# Patient Record
Sex: Female | Born: 1950 | Race: White | Hispanic: No | Marital: Married | Smoking: Never smoker
Health system: Southern US, Community
[De-identification: ages and names within clinical notes are randomized; demographics above are authoritative.]

## PROBLEM LIST (undated history)

## (undated) DIAGNOSIS — T8859XA Other complications of anesthesia, initial encounter: Secondary | ICD-10-CM

## (undated) DIAGNOSIS — F32A Depression, unspecified: Secondary | ICD-10-CM

## (undated) DIAGNOSIS — K9041 Non-celiac gluten sensitivity: Secondary | ICD-10-CM

## (undated) DIAGNOSIS — M1990 Unspecified osteoarthritis, unspecified site: Secondary | ICD-10-CM

## (undated) DIAGNOSIS — F988 Other specified behavioral and emotional disorders with onset usually occurring in childhood and adolescence: Secondary | ICD-10-CM

## (undated) DIAGNOSIS — R112 Nausea with vomiting, unspecified: Secondary | ICD-10-CM

## (undated) DIAGNOSIS — M25511 Pain in right shoulder: Secondary | ICD-10-CM

## (undated) DIAGNOSIS — R198 Other specified symptoms and signs involving the digestive system and abdomen: Secondary | ICD-10-CM

## (undated) DIAGNOSIS — K589 Irritable bowel syndrome without diarrhea: Secondary | ICD-10-CM

## (undated) DIAGNOSIS — E785 Hyperlipidemia, unspecified: Secondary | ICD-10-CM

## (undated) DIAGNOSIS — J302 Other seasonal allergic rhinitis: Secondary | ICD-10-CM

## (undated) DIAGNOSIS — M069 Rheumatoid arthritis, unspecified: Secondary | ICD-10-CM

## (undated) DIAGNOSIS — J189 Pneumonia, unspecified organism: Secondary | ICD-10-CM

## (undated) DIAGNOSIS — I2510 Atherosclerotic heart disease of native coronary artery without angina pectoris: Secondary | ICD-10-CM

## (undated) DIAGNOSIS — K900 Celiac disease: Secondary | ICD-10-CM

## (undated) DIAGNOSIS — Z9889 Other specified postprocedural states: Secondary | ICD-10-CM

## (undated) DIAGNOSIS — F329 Major depressive disorder, single episode, unspecified: Secondary | ICD-10-CM

## (undated) DIAGNOSIS — F419 Anxiety disorder, unspecified: Secondary | ICD-10-CM

## (undated) HISTORY — DX: Other specified behavioral and emotional disorders with onset usually occurring in childhood and adolescence: F98.8

## (undated) HISTORY — DX: Anxiety disorder, unspecified: F41.9

## (undated) HISTORY — DX: Other specified symptoms and signs involving the digestive system and abdomen: R19.8

## (undated) HISTORY — DX: Depression, unspecified: F32.A

## (undated) HISTORY — PX: TOTAL HIP ARTHROPLASTY: SHX124

## (undated) HISTORY — DX: Major depressive disorder, single episode, unspecified: F32.9

## (undated) HISTORY — PX: TONSILLECTOMY: SUR1361

## (undated) HISTORY — DX: Pneumonia, unspecified organism: J18.9

## (undated) HISTORY — PX: TOTAL SHOULDER ARTHROPLASTY: SHX126

## (undated) HISTORY — DX: Unspecified osteoarthritis, unspecified site: M19.90

## (undated) HISTORY — DX: Irritable bowel syndrome, unspecified: K58.9

## (undated) HISTORY — DX: Celiac disease: K90.0

## (undated) HISTORY — PX: ROTATOR CUFF REPAIR: SHX139

## (undated) HISTORY — DX: Hyperlipidemia, unspecified: E78.5

## (undated) HISTORY — DX: Pain in right shoulder: M25.511

---

## 1998-01-18 ENCOUNTER — Other Ambulatory Visit: Admission: RE | Admit: 1998-01-18 | Discharge: 1998-01-18 | Payer: Self-pay | Admitting: Obstetrics and Gynecology

## 1999-04-04 ENCOUNTER — Other Ambulatory Visit: Admission: RE | Admit: 1999-04-04 | Discharge: 1999-04-04 | Payer: Self-pay | Admitting: Obstetrics and Gynecology

## 1999-04-23 ENCOUNTER — Encounter: Payer: Self-pay | Admitting: Obstetrics and Gynecology

## 1999-04-23 ENCOUNTER — Ambulatory Visit (HOSPITAL_COMMUNITY): Admission: RE | Admit: 1999-04-23 | Discharge: 1999-04-23 | Payer: Self-pay | Admitting: Obstetrics and Gynecology

## 1999-09-20 ENCOUNTER — Encounter: Payer: Self-pay | Admitting: Chiropractic Medicine

## 1999-09-20 ENCOUNTER — Ambulatory Visit (HOSPITAL_COMMUNITY): Admission: RE | Admit: 1999-09-20 | Discharge: 1999-09-20 | Payer: Self-pay | Admitting: Chiropractic Medicine

## 1999-12-04 ENCOUNTER — Ambulatory Visit (HOSPITAL_COMMUNITY): Admission: RE | Admit: 1999-12-04 | Discharge: 1999-12-04 | Payer: Self-pay | Admitting: Orthopaedic Surgery

## 1999-12-04 ENCOUNTER — Encounter: Payer: Self-pay | Admitting: Orthopaedic Surgery

## 1999-12-17 ENCOUNTER — Ambulatory Visit (HOSPITAL_COMMUNITY): Admission: RE | Admit: 1999-12-17 | Discharge: 1999-12-17 | Payer: Self-pay | Admitting: Orthopaedic Surgery

## 1999-12-17 ENCOUNTER — Encounter: Payer: Self-pay | Admitting: Orthopaedic Surgery

## 2000-01-01 ENCOUNTER — Ambulatory Visit (HOSPITAL_COMMUNITY): Admission: RE | Admit: 2000-01-01 | Discharge: 2000-01-01 | Payer: Self-pay

## 2000-01-01 ENCOUNTER — Encounter: Payer: Self-pay | Admitting: Orthopaedic Surgery

## 2000-02-21 ENCOUNTER — Encounter: Payer: Self-pay | Admitting: Chiropractic Medicine

## 2000-02-21 ENCOUNTER — Ambulatory Visit (HOSPITAL_COMMUNITY): Admission: RE | Admit: 2000-02-21 | Discharge: 2000-02-21 | Payer: Self-pay | Admitting: Chiropractic Medicine

## 2000-05-05 ENCOUNTER — Encounter: Admission: RE | Admit: 2000-05-05 | Discharge: 2000-05-05 | Payer: Self-pay | Admitting: Neurosurgery

## 2000-05-11 ENCOUNTER — Other Ambulatory Visit: Admission: RE | Admit: 2000-05-11 | Discharge: 2000-05-11 | Payer: Self-pay | Admitting: Obstetrics and Gynecology

## 2000-06-02 ENCOUNTER — Encounter: Payer: Self-pay | Admitting: Orthopedic Surgery

## 2000-06-02 ENCOUNTER — Ambulatory Visit (HOSPITAL_COMMUNITY): Admission: RE | Admit: 2000-06-02 | Discharge: 2000-06-02 | Payer: Self-pay | Admitting: Orthopedic Surgery

## 2000-06-03 ENCOUNTER — Encounter: Payer: Self-pay | Admitting: Orthopedic Surgery

## 2000-06-08 ENCOUNTER — Inpatient Hospital Stay (HOSPITAL_COMMUNITY): Admission: RE | Admit: 2000-06-08 | Discharge: 2000-06-12 | Payer: Self-pay | Admitting: Orthopedic Surgery

## 2000-06-08 ENCOUNTER — Encounter: Payer: Self-pay | Admitting: Orthopedic Surgery

## 2000-06-12 ENCOUNTER — Inpatient Hospital Stay (HOSPITAL_COMMUNITY)
Admission: RE | Admit: 2000-06-12 | Discharge: 2000-06-18 | Payer: Self-pay | Admitting: Physical Medicine & Rehabilitation

## 2000-07-23 ENCOUNTER — Encounter: Admission: RE | Admit: 2000-07-23 | Discharge: 2000-10-21 | Payer: Self-pay | Admitting: Orthopedic Surgery

## 2000-10-28 ENCOUNTER — Encounter: Admission: RE | Admit: 2000-10-28 | Discharge: 2001-01-26 | Payer: Self-pay | Admitting: Orthopedic Surgery

## 2000-12-24 ENCOUNTER — Encounter: Admission: RE | Admit: 2000-12-24 | Discharge: 2001-03-24 | Payer: Self-pay | Admitting: Orthopedic Surgery

## 2001-03-25 ENCOUNTER — Encounter: Admission: RE | Admit: 2001-03-25 | Discharge: 2001-06-23 | Payer: Self-pay | Admitting: Orthopedic Surgery

## 2001-06-24 ENCOUNTER — Encounter: Admission: RE | Admit: 2001-06-24 | Discharge: 2001-07-22 | Payer: Self-pay | Admitting: Orthopedic Surgery

## 2002-04-18 ENCOUNTER — Other Ambulatory Visit: Admission: RE | Admit: 2002-04-18 | Discharge: 2002-04-18 | Payer: Self-pay | Admitting: Obstetrics and Gynecology

## 2002-05-10 ENCOUNTER — Encounter: Payer: Self-pay | Admitting: Obstetrics and Gynecology

## 2002-05-10 ENCOUNTER — Encounter: Admission: RE | Admit: 2002-05-10 | Discharge: 2002-05-10 | Payer: Self-pay | Admitting: Obstetrics and Gynecology

## 2003-05-12 ENCOUNTER — Encounter: Admission: RE | Admit: 2003-05-12 | Discharge: 2003-05-12 | Payer: Self-pay | Admitting: Obstetrics and Gynecology

## 2003-08-10 ENCOUNTER — Ambulatory Visit (HOSPITAL_COMMUNITY): Admission: RE | Admit: 2003-08-10 | Discharge: 2003-08-10 | Payer: Self-pay | Admitting: Internal Medicine

## 2003-08-10 ENCOUNTER — Encounter (INDEPENDENT_AMBULATORY_CARE_PROVIDER_SITE_OTHER): Payer: Self-pay | Admitting: Specialist

## 2003-08-11 ENCOUNTER — Ambulatory Visit (HOSPITAL_COMMUNITY): Admission: RE | Admit: 2003-08-11 | Discharge: 2003-08-11 | Payer: Self-pay | Admitting: Internal Medicine

## 2004-04-04 ENCOUNTER — Ambulatory Visit: Payer: Self-pay | Admitting: Internal Medicine

## 2004-04-08 ENCOUNTER — Ambulatory Visit: Payer: Self-pay | Admitting: Internal Medicine

## 2004-05-02 ENCOUNTER — Other Ambulatory Visit: Admission: RE | Admit: 2004-05-02 | Discharge: 2004-05-02 | Payer: Self-pay | Admitting: Obstetrics and Gynecology

## 2004-06-10 ENCOUNTER — Ambulatory Visit: Payer: Self-pay | Admitting: Internal Medicine

## 2004-08-21 ENCOUNTER — Ambulatory Visit: Payer: Self-pay | Admitting: Internal Medicine

## 2004-08-23 ENCOUNTER — Ambulatory Visit (HOSPITAL_COMMUNITY): Admission: RE | Admit: 2004-08-23 | Discharge: 2004-08-23 | Payer: Self-pay | Admitting: Internal Medicine

## 2005-05-07 ENCOUNTER — Other Ambulatory Visit: Admission: RE | Admit: 2005-05-07 | Discharge: 2005-05-07 | Payer: Self-pay | Admitting: Obstetrics and Gynecology

## 2005-09-24 ENCOUNTER — Ambulatory Visit: Payer: Self-pay | Admitting: Internal Medicine

## 2005-10-15 ENCOUNTER — Ambulatory Visit: Payer: Self-pay | Admitting: Internal Medicine

## 2005-11-05 ENCOUNTER — Ambulatory Visit: Payer: Self-pay | Admitting: Internal Medicine

## 2006-02-05 ENCOUNTER — Ambulatory Visit: Payer: Self-pay | Admitting: Internal Medicine

## 2006-05-11 ENCOUNTER — Other Ambulatory Visit: Admission: RE | Admit: 2006-05-11 | Discharge: 2006-05-11 | Payer: Self-pay | Admitting: Obstetrics and Gynecology

## 2006-05-12 ENCOUNTER — Encounter: Admission: RE | Admit: 2006-05-12 | Discharge: 2006-05-12 | Payer: Self-pay | Admitting: Obstetrics and Gynecology

## 2006-08-03 ENCOUNTER — Ambulatory Visit: Payer: Self-pay | Admitting: Internal Medicine

## 2006-08-03 LAB — CONVERTED CEMR LAB
ALT: 27 units/L (ref 0–40)
AST: 32 units/L (ref 0–37)
Albumin: 3.7 g/dL (ref 3.5–5.2)
Alkaline Phosphatase: 37 units/L — ABNORMAL LOW (ref 39–117)
BUN: 9 mg/dL (ref 6–23)
Basophils Absolute: 0.1 10*3/uL (ref 0.0–0.1)
Basophils Relative: 0.9 % (ref 0.0–1.0)
Bilirubin, Direct: 0.1 mg/dL (ref 0.0–0.3)
CO2: 32 meq/L (ref 19–32)
Calcium: 9.8 mg/dL (ref 8.4–10.5)
Chloride: 103 meq/L (ref 96–112)
Creatinine, Ser: 0.8 mg/dL (ref 0.4–1.2)
Eosinophils Absolute: 0.1 10*3/uL (ref 0.0–0.6)
Eosinophils Relative: 2.2 % (ref 0.0–5.0)
GFR calc Af Amer: 95 mL/min
GFR calc non Af Amer: 79 mL/min
Glucose, Bld: 108 mg/dL — ABNORMAL HIGH (ref 70–99)
HCT: 39.9 % (ref 36.0–46.0)
Hemoglobin: 13.9 g/dL (ref 12.0–15.0)
Lymphocytes Relative: 28.6 % (ref 12.0–46.0)
MCHC: 34.9 g/dL (ref 30.0–36.0)
MCV: 91.9 fL (ref 78.0–100.0)
Monocytes Absolute: 0.7 10*3/uL (ref 0.2–0.7)
Monocytes Relative: 10.6 % (ref 3.0–11.0)
Neutro Abs: 3.7 10*3/uL (ref 1.4–7.7)
Neutrophils Relative %: 57.7 % (ref 43.0–77.0)
Platelets: 300 10*3/uL (ref 150–400)
Potassium: 4 meq/L (ref 3.5–5.1)
RBC: 4.34 M/uL (ref 3.87–5.11)
RDW: 12.3 % (ref 11.5–14.6)
Sodium: 139 meq/L (ref 135–145)
TSH: 1.68 microintl units/mL (ref 0.35–5.50)
Total Bilirubin: 0.7 mg/dL (ref 0.3–1.2)
Total CK: 173 units/L (ref 7–177)
Total Protein: 6.3 g/dL (ref 6.0–8.3)
Vit D, 1,25-Dihydroxy: 27 (ref 20–57)
Vitamin B-12: 581 pg/mL (ref 211–911)
WBC: 6.5 10*3/uL (ref 4.5–10.5)

## 2006-08-05 ENCOUNTER — Ambulatory Visit: Payer: Self-pay | Admitting: Internal Medicine

## 2006-10-08 ENCOUNTER — Ambulatory Visit: Payer: Self-pay | Admitting: Internal Medicine

## 2006-10-08 LAB — CONVERTED CEMR LAB: Vit D, 1,25-Dihydroxy: 40 (ref 20–57)

## 2006-10-13 ENCOUNTER — Encounter: Admission: RE | Admit: 2006-10-13 | Discharge: 2006-10-13 | Payer: Self-pay | Admitting: Internal Medicine

## 2006-11-02 ENCOUNTER — Ambulatory Visit: Payer: Self-pay | Admitting: Internal Medicine

## 2007-02-04 ENCOUNTER — Encounter: Payer: Self-pay | Admitting: Internal Medicine

## 2007-02-16 ENCOUNTER — Ambulatory Visit: Payer: Self-pay | Admitting: Internal Medicine

## 2007-02-16 ENCOUNTER — Encounter: Payer: Self-pay | Admitting: Internal Medicine

## 2007-02-16 DIAGNOSIS — M25519 Pain in unspecified shoulder: Secondary | ICD-10-CM | POA: Insufficient documentation

## 2007-02-28 DIAGNOSIS — M1710 Unilateral primary osteoarthritis, unspecified knee: Secondary | ICD-10-CM | POA: Insufficient documentation

## 2007-02-28 DIAGNOSIS — E785 Hyperlipidemia, unspecified: Secondary | ICD-10-CM | POA: Insufficient documentation

## 2007-02-28 DIAGNOSIS — M1990 Unspecified osteoarthritis, unspecified site: Secondary | ICD-10-CM

## 2007-02-28 DIAGNOSIS — M179 Osteoarthritis of knee, unspecified: Secondary | ICD-10-CM | POA: Insufficient documentation

## 2007-02-28 DIAGNOSIS — M810 Age-related osteoporosis without current pathological fracture: Secondary | ICD-10-CM | POA: Insufficient documentation

## 2007-02-28 DIAGNOSIS — F32A Depression, unspecified: Secondary | ICD-10-CM | POA: Insufficient documentation

## 2007-02-28 DIAGNOSIS — F329 Major depressive disorder, single episode, unspecified: Secondary | ICD-10-CM

## 2007-05-12 ENCOUNTER — Ambulatory Visit: Payer: Self-pay | Admitting: Internal Medicine

## 2007-05-12 LAB — CONVERTED CEMR LAB
ALT: 17 units/L (ref 0–35)
AST: 21 units/L (ref 0–37)
Albumin: 4.1 g/dL (ref 3.5–5.2)
Alkaline Phosphatase: 49 units/L (ref 39–117)
BUN: 11 mg/dL (ref 6–23)
Bilirubin, Direct: 0.1 mg/dL (ref 0.0–0.3)
CO2: 32 meq/L (ref 19–32)
Calcium: 9.7 mg/dL (ref 8.4–10.5)
Chloride: 103 meq/L (ref 96–112)
Cholesterol: 221 mg/dL (ref 0–200)
Creatinine, Ser: 0.8 mg/dL (ref 0.4–1.2)
Direct LDL: 115.6 mg/dL
GFR calc Af Amer: 95 mL/min
GFR calc non Af Amer: 78 mL/min
Glucose, Bld: 108 mg/dL — ABNORMAL HIGH (ref 70–99)
HDL: 85.9 mg/dL (ref >39.0)
Potassium: 4.5 meq/L (ref 3.5–5.1)
Sodium: 140 meq/L (ref 135–145)
TSH: 2.44 microintl units/mL (ref 0.35–5.50)
Total Bilirubin: 0.9 mg/dL (ref 0.3–1.2)
Total CHOL/HDL Ratio: 2.6
Total Protein: 6.6 g/dL (ref 6.0–8.3)
Triglycerides: 65 mg/dL (ref 0–149)
VLDL: 13 mg/dL (ref 0–40)

## 2007-05-13 ENCOUNTER — Other Ambulatory Visit: Admission: RE | Admit: 2007-05-13 | Discharge: 2007-05-13 | Payer: Self-pay | Admitting: Obstetrics and Gynecology

## 2007-05-17 ENCOUNTER — Ambulatory Visit: Payer: Self-pay | Admitting: Internal Medicine

## 2007-05-17 DIAGNOSIS — F988 Other specified behavioral and emotional disorders with onset usually occurring in childhood and adolescence: Secondary | ICD-10-CM | POA: Insufficient documentation

## 2007-05-17 DIAGNOSIS — K589 Irritable bowel syndrome without diarrhea: Secondary | ICD-10-CM | POA: Insufficient documentation

## 2007-05-20 ENCOUNTER — Telehealth: Payer: Self-pay | Admitting: Internal Medicine

## 2007-05-24 ENCOUNTER — Encounter: Payer: Self-pay | Admitting: Internal Medicine

## 2007-06-15 ENCOUNTER — Encounter: Admission: RE | Admit: 2007-06-15 | Discharge: 2007-06-15 | Payer: Self-pay | Admitting: Obstetrics and Gynecology

## 2007-07-02 ENCOUNTER — Telehealth: Payer: Self-pay | Admitting: Internal Medicine

## 2007-08-16 ENCOUNTER — Ambulatory Visit: Payer: Self-pay | Admitting: Internal Medicine

## 2007-10-20 ENCOUNTER — Encounter: Payer: Self-pay | Admitting: Internal Medicine

## 2007-11-19 ENCOUNTER — Telehealth: Payer: Self-pay | Admitting: Internal Medicine

## 2007-12-28 ENCOUNTER — Ambulatory Visit: Payer: Self-pay | Admitting: Internal Medicine

## 2007-12-28 DIAGNOSIS — N924 Excessive bleeding in the premenopausal period: Secondary | ICD-10-CM | POA: Insufficient documentation

## 2008-02-15 ENCOUNTER — Encounter: Payer: Self-pay | Admitting: Internal Medicine

## 2008-05-15 ENCOUNTER — Other Ambulatory Visit: Admission: RE | Admit: 2008-05-15 | Discharge: 2008-05-15 | Payer: Self-pay | Admitting: Obstetrics and Gynecology

## 2008-06-28 ENCOUNTER — Ambulatory Visit: Payer: Self-pay | Admitting: Internal Medicine

## 2008-06-28 DIAGNOSIS — L570 Actinic keratosis: Secondary | ICD-10-CM | POA: Insufficient documentation

## 2008-06-28 DIAGNOSIS — R0989 Other specified symptoms and signs involving the circulatory and respiratory systems: Secondary | ICD-10-CM | POA: Insufficient documentation

## 2008-06-28 DIAGNOSIS — L600 Ingrowing nail: Secondary | ICD-10-CM | POA: Insufficient documentation

## 2008-06-28 DIAGNOSIS — B351 Tinea unguium: Secondary | ICD-10-CM | POA: Insufficient documentation

## 2008-06-30 ENCOUNTER — Encounter: Payer: Self-pay | Admitting: Internal Medicine

## 2008-06-30 ENCOUNTER — Ambulatory Visit: Payer: Self-pay

## 2008-07-06 ENCOUNTER — Encounter: Admission: RE | Admit: 2008-07-06 | Discharge: 2008-07-06 | Payer: Self-pay | Admitting: Obstetrics and Gynecology

## 2009-02-07 ENCOUNTER — Telehealth: Payer: Self-pay | Admitting: Internal Medicine

## 2009-05-03 ENCOUNTER — Ambulatory Visit: Payer: Self-pay | Admitting: Internal Medicine

## 2009-05-03 DIAGNOSIS — F411 Generalized anxiety disorder: Secondary | ICD-10-CM | POA: Insufficient documentation

## 2009-05-03 DIAGNOSIS — R634 Abnormal weight loss: Secondary | ICD-10-CM | POA: Insufficient documentation

## 2009-05-03 DIAGNOSIS — Z87891 Personal history of nicotine dependence: Secondary | ICD-10-CM | POA: Insufficient documentation

## 2009-05-03 DIAGNOSIS — F419 Anxiety disorder, unspecified: Secondary | ICD-10-CM | POA: Insufficient documentation

## 2009-05-03 DIAGNOSIS — J069 Acute upper respiratory infection, unspecified: Secondary | ICD-10-CM | POA: Insufficient documentation

## 2009-05-03 DIAGNOSIS — R21 Rash and other nonspecific skin eruption: Secondary | ICD-10-CM | POA: Insufficient documentation

## 2009-05-07 ENCOUNTER — Telehealth: Payer: Self-pay | Admitting: Internal Medicine

## 2009-05-07 LAB — CONVERTED CEMR LAB
ALT: 16 units/L (ref 0–35)
AST: 22 units/L (ref 0–37)
Albumin: 3.6 g/dL (ref 3.5–5.2)
Alkaline Phosphatase: 40 units/L (ref 39–117)
BUN: 6 mg/dL (ref 6–23)
Basophils Absolute: 0 10*3/uL (ref 0.0–0.1)
Basophils Relative: 0.6 % (ref 0.0–3.0)
Bilirubin Urine: NEGATIVE
Bilirubin, Direct: 0 mg/dL (ref 0.0–0.3)
CO2: 32 meq/L (ref 19–32)
Calcium: 9.5 mg/dL (ref 8.4–10.5)
Chloride: 105 meq/L (ref 96–112)
Creatinine, Ser: 0.8 mg/dL (ref 0.4–1.2)
Eosinophils Absolute: 0.1 10*3/uL (ref 0.0–0.7)
Eosinophils Relative: 1.3 % (ref 0.0–5.0)
GFR calc non Af Amer: 77.64 mL/min (ref >60)
Glucose, Bld: 85 mg/dL (ref 70–99)
HCT: 42.6 % (ref 36.0–46.0)
Hemoglobin, Urine: NEGATIVE
Hemoglobin: 14.1 g/dL (ref 12.0–15.0)
Ketones, ur: NEGATIVE mg/dL
Leukocytes, UA: NEGATIVE
Lipase: 17 units/L (ref 11.0–59.0)
Lymphocytes Relative: 20.1 % (ref 12.0–46.0)
Lymphs Abs: 1.6 10*3/uL (ref 0.7–4.0)
MCHC: 33.2 g/dL (ref 30.0–36.0)
MCV: 97.4 fL (ref 78.0–100.0)
Monocytes Absolute: 0.7 10*3/uL (ref 0.1–1.0)
Monocytes Relative: 8.3 % (ref 3.0–12.0)
Neutro Abs: 5.5 10*3/uL (ref 1.4–7.7)
Neutrophils Relative %: 69.7 % (ref 43.0–77.0)
Nitrite: NEGATIVE
Platelets: 278 10*3/uL (ref 150.0–400.0)
Potassium: 4.1 meq/L (ref 3.5–5.1)
RBC: 4.37 M/uL (ref 3.87–5.11)
RDW: 12.3 % (ref 11.5–14.6)
Sed Rate: 25 mm/hr — ABNORMAL HIGH (ref 0–22)
Sodium: 144 meq/L (ref 135–145)
Specific Gravity, Urine: <=1.005 (ref 1.000–1.030)
TSH: 1.52 microintl units/mL (ref 0.35–5.50)
Total Bilirubin: 0.7 mg/dL (ref 0.3–1.2)
Total Protein, Urine: NEGATIVE mg/dL
Total Protein: 6.6 g/dL (ref 6.0–8.3)
Urine Glucose: NEGATIVE mg/dL
Urobilinogen, UA: 0.2 (ref 0.0–1.0)
Vitamin B-12: 501 pg/mL (ref 211–911)
WBC: 7.9 10*3/uL (ref 4.5–10.5)
pH: 6.5 (ref 5.0–8.0)

## 2009-06-05 ENCOUNTER — Other Ambulatory Visit: Admission: RE | Admit: 2009-06-05 | Discharge: 2009-06-05 | Payer: Self-pay | Admitting: Obstetrics and Gynecology

## 2009-06-19 ENCOUNTER — Telehealth: Payer: Self-pay | Admitting: Internal Medicine

## 2009-08-17 ENCOUNTER — Emergency Department (HOSPITAL_COMMUNITY): Admission: EM | Admit: 2009-08-17 | Discharge: 2009-08-17 | Payer: Self-pay | Admitting: Family Medicine

## 2010-05-22 ENCOUNTER — Telehealth: Payer: Self-pay | Admitting: Internal Medicine

## 2010-05-26 LAB — CONVERTED CEMR LAB
ALT: 17 units/L (ref 0–35)
AST: 19 units/L (ref 0–37)
Albumin: 4 g/dL (ref 3.5–5.2)
Alkaline Phosphatase: 40 units/L (ref 39–117)
BUN: 13 mg/dL (ref 6–23)
Basophils Absolute: 0.1 10*3/uL (ref 0.0–0.1)
Basophils Relative: 1.7 % (ref 0.0–3.0)
Bilirubin Urine: NEGATIVE
Bilirubin, Direct: 0.1 mg/dL (ref 0.0–0.3)
CO2: 31 meq/L (ref 19–32)
Calcium: 9.3 mg/dL (ref 8.4–10.5)
Chloride: 104 meq/L (ref 96–112)
Cholesterol: 271 mg/dL (ref 0–200)
Creatinine, Ser: 0.7 mg/dL (ref 0.4–1.2)
Direct LDL: 144.9 mg/dL
Eosinophils Absolute: 0.1 10*3/uL (ref 0.0–0.7)
Eosinophils Relative: 1.7 % (ref 0.0–5.0)
GFR calc Af Amer: 110 mL/min
GFR calc non Af Amer: 91 mL/min
Glucose, Bld: 104 mg/dL — ABNORMAL HIGH (ref 70–99)
HCT: 40.7 % (ref 36.0–46.0)
HDL: 97.2 mg/dL (ref >39.0)
Hemoglobin, Urine: NEGATIVE
Hemoglobin: 13.8 g/dL (ref 12.0–15.0)
Ketones, ur: NEGATIVE mg/dL
Leukocytes, UA: NEGATIVE
Lymphocytes Relative: 34.5 % (ref 12.0–46.0)
MCHC: 33.9 g/dL (ref 30.0–36.0)
MCV: 94.8 fL (ref 78.0–100.0)
Monocytes Absolute: 0.5 10*3/uL (ref 0.1–1.0)
Monocytes Relative: 7.6 % (ref 3.0–12.0)
Neutro Abs: 3.3 10*3/uL (ref 1.4–7.7)
Neutrophils Relative %: 54.5 % (ref 43.0–77.0)
Nitrite: NEGATIVE
Platelets: 251 10*3/uL (ref 150–400)
Potassium: 3.9 meq/L (ref 3.5–5.1)
RBC: 4.29 M/uL (ref 3.87–5.11)
RDW: 12.6 % (ref 11.5–14.6)
Sodium: 140 meq/L (ref 135–145)
Specific Gravity, Urine: 1.01 (ref 1.000–1.035)
TSH: 1.8 microintl units/mL (ref 0.35–5.50)
Total Bilirubin: 0.8 mg/dL (ref 0.3–1.2)
Total CHOL/HDL Ratio: 2.8
Total Protein, Urine: NEGATIVE mg/dL
Total Protein: 6.8 g/dL (ref 6.0–8.3)
Triglycerides: 66 mg/dL (ref 0–149)
Urine Glucose: NEGATIVE mg/dL
Urobilinogen, UA: 0.2 (ref 0.0–1.0)
VLDL: 13 mg/dL (ref 0–40)
WBC: 6.1 10*3/uL (ref 4.5–10.5)
pH: 7 (ref 5.0–8.0)

## 2010-05-30 ENCOUNTER — Other Ambulatory Visit: Payer: Self-pay | Admitting: Internal Medicine

## 2010-05-30 ENCOUNTER — Encounter (INDEPENDENT_AMBULATORY_CARE_PROVIDER_SITE_OTHER): Payer: Self-pay | Admitting: *Deleted

## 2010-05-30 ENCOUNTER — Encounter: Payer: Self-pay | Admitting: Internal Medicine

## 2010-05-30 ENCOUNTER — Other Ambulatory Visit: Payer: BC Managed Care – PPO

## 2010-05-30 DIAGNOSIS — Z0000 Encounter for general adult medical examination without abnormal findings: Secondary | ICD-10-CM

## 2010-05-30 DIAGNOSIS — R5381 Other malaise: Secondary | ICD-10-CM

## 2010-05-30 DIAGNOSIS — E7800 Pure hypercholesterolemia, unspecified: Secondary | ICD-10-CM

## 2010-05-30 DIAGNOSIS — E785 Hyperlipidemia, unspecified: Secondary | ICD-10-CM

## 2010-05-30 DIAGNOSIS — R5383 Other fatigue: Secondary | ICD-10-CM

## 2010-05-30 LAB — CBC WITH DIFFERENTIAL/PLATELET
Basophils Absolute: 0 10*3/uL (ref 0.0–0.1)
Basophils Relative: 0.9 % (ref 0.0–3.0)
Eosinophils Absolute: 0.1 10*3/uL (ref 0.0–0.7)
Eosinophils Relative: 2.2 % (ref 0.0–5.0)
HCT: 42 % (ref 36.0–46.0)
Hemoglobin: 14.9 g/dL (ref 12.0–15.0)
Lymphocytes Relative: 39.8 % (ref 12.0–46.0)
Lymphs Abs: 1.5 10*3/uL (ref 0.7–4.0)
MCHC: 35.5 g/dL (ref 30.0–36.0)
MCV: 94.8 fl (ref 78.0–100.0)
Monocytes Absolute: 0.4 10*3/uL (ref 0.1–1.0)
Monocytes Relative: 11 % (ref 3.0–12.0)
Neutro Abs: 1.7 10*3/uL (ref 1.4–7.7)
Neutrophils Relative %: 46.1 % (ref 43.0–77.0)
Platelets: 308 10*3/uL (ref 150.0–400.0)
RBC: 4.43 Mil/uL (ref 3.87–5.11)
RDW: 13.2 % (ref 11.5–14.6)
WBC: 3.7 10*3/uL — ABNORMAL LOW (ref 4.5–10.5)

## 2010-05-30 LAB — TSH: TSH: 1.7 u[IU]/mL (ref 0.35–5.50)

## 2010-05-30 LAB — BASIC METABOLIC PANEL
BUN: 8 mg/dL (ref 6–23)
CO2: 31 mEq/L (ref 19–32)
Calcium: 9.3 mg/dL (ref 8.4–10.5)
Chloride: 99 mEq/L (ref 96–112)
Creatinine, Ser: 0.8 mg/dL (ref 0.4–1.2)
GFR: 80.86 mL/min (ref >60.00)
Glucose, Bld: 86 mg/dL (ref 70–99)
Potassium: 4.6 mEq/L (ref 3.5–5.1)
Sodium: 135 mEq/L (ref 135–145)

## 2010-05-30 LAB — URINALYSIS
Bilirubin Urine: NEGATIVE
Hgb urine dipstick: NEGATIVE
Ketones, ur: NEGATIVE
Leukocytes, UA: NEGATIVE
Nitrite: NEGATIVE
Specific Gravity, Urine: 1.01 (ref 1.000–1.030)
Total Protein, Urine: NEGATIVE
Urine Glucose: NEGATIVE
Urobilinogen, UA: 0.2 (ref 0.0–1.0)
pH: 8.5 (ref 5.0–8.0)

## 2010-05-30 LAB — HEPATIC FUNCTION PANEL
ALT: 24 U/L (ref 0–35)
AST: 27 U/L (ref 0–37)
Albumin: 4.1 g/dL (ref 3.5–5.2)
Alkaline Phosphatase: 44 U/L (ref 39–117)
Bilirubin, Direct: 0.1 mg/dL (ref 0.0–0.3)
Total Bilirubin: 0.3 mg/dL (ref 0.3–1.2)
Total Protein: 6.4 g/dL (ref 6.0–8.3)

## 2010-05-30 LAB — CONVERTED CEMR LAB: Vit D, 25-Hydroxy: 53 ng/mL (ref 30–89)

## 2010-05-30 LAB — LIPID PANEL
Cholesterol: 246 mg/dL — ABNORMAL HIGH (ref 0–200)
HDL: 114.5 mg/dL (ref >39.00)
Total CHOL/HDL Ratio: 2
Triglycerides: 87 mg/dL (ref 0.0–149.0)
VLDL: 17.4 mg/dL (ref 0.0–40.0)

## 2010-05-30 LAB — LDL CHOLESTEROL, DIRECT: Direct LDL: 110.8 mg/dL

## 2010-05-30 LAB — HIGH SENSITIVITY CRP: CRP, High Sensitivity: <1 mg/L (ref 0.00–5.00)

## 2010-05-30 LAB — B12 AND FOLATE PANEL
Folate: >24.8 ng/mL (ref >5.9)
Vitamin B-12: 603 pg/mL (ref 211–911)

## 2010-05-30 NOTE — Progress Notes (Signed)
Summary: Pain/?UTI?  Phone Note Call from Patient   Summary of Call: Pt c/o possible "uti pain" No open apts today. Pain is severe, and she does not want to go to UC. U/a? or work in office visit?  Initial call taken by: Lamar Sprinkles, CMA,  June 19, 2009 1:34 PM  Follow-up for Phone Call        Pt also had called her GYN, she is getting u/a at their office. She will call office back and schedule office visit tomorrow if she does not get any answers from GYN.  Follow-up by: Lamar Sprinkles, CMA,  June 19, 2009 2:28 PM  Additional Follow-up for Phone Call Additional follow up Details #1::        Noted. OK Can call in Ceftin if no reply from Gyn Additional Follow-up by: Tresa Garter MD,  June 19, 2009 5:33 PM    Additional Follow-up for Phone Call Additional follow up Details #2::    Spoke with pt, she did have UTI and was given rx by GYN Follow-up by: Lamar Sprinkles, CMA,  June 19, 2009 5:47 PM  New/Updated Medications: CEFTIN 250 MG TABS (CEFUROXIME AXETIL) 1 by mouth two times a day for bladder infection

## 2010-05-30 NOTE — Assessment & Plan Note (Signed)
Vital Signs:  Patient Profile:   60 Years Old Female Weight:      142 pounds Temp:     97.7 degrees F Pulse rate:   89 / minute BP sitting:   112 / 60                 Chief Complaint:  Multiple medical problems or concerns.  History of Present Illness: The patient presents for a follow up of hyperlipidemia, L shoulder pain                       \\\\\\\\\\\\\\\ Current Allergies: ! LEVAQUIN ! PCN ! BIAXIN DARVOCET ZYPREXA (OLANZAPINE)  Past Medical History:    IBS    Hyperlipidemia    Osteoarthritis    Osteoporosis    Depression w/bipolar traits                GI  [name]    GYN  Myrene Buddy  Past Surgical History:    Total hip replacement R 2002   Family History:    Family History of CAD Female 1st degree relative <60    F Cretzfeld- jacobs disease  Social History:    Occupation:  Loss adjuster, [employer]ered. [employer]    Married    Former Smoker    Alcohol use-no    Regular exercise-yes   Risk Factors:  Tobacco use:  quit Alcohol use:  no Exercise:  yes    Physical Exam  General:     Well-developed,well-nourished,in no acute distress; alert,appropriate and cooperative throughout examination Eyes:     No corneal or conjunctival inflammation noted. EOMI. Perrla. Funduscopic exam benign, without hemorrhages, exudates or papilledema. Vision grossly normal. Nose:     External nasal examination shows no deformity or inflammation. Nasal mucosa are pink and moist without lesions or exudates. Mouth:     Oral mucosa and oropharynx without lesions or exudates.  Teeth in good repair. Neck:     No deformities, masses, or tenderness noted. Lungs:     Normal respiratory effort, chest expands symmetrically. Lungs are clear to auscultation, no crackles or wheezes. Heart:     Normal rate and regular rhythm. S1 and S2 normal without gallop, murmur, click, rub or other extra sounds. Abdomen:     Bowel sounds positive,abdomen soft and  non-tender without masses, organomegaly or hernias noted. Msk:     R shoulder pain w/ ROM Extremities:     No clubbing, cyanosis, edema, or deformity noted with normal full range of motion of all joints.   Neurologic:     No cranial nerve deficits noted. Station and gait are normal. Plantar reflexes are down-going bilaterally. DTRs are symmetrical throughout. Sensory, motor and coordinative functions appear intact. Skin:     Intact without suspicious lesions or rashes Psych:     slightly anxious.      Impression & Recommendations:  Problem # 1:  SHOULDER PAIN (ICD-719.41) R Assessment: Improved Rolfphing Voltaren gel Skelaxin Her updated medication list for this problem includes:    Skelaxin 800 Mg Tabs (Metaxalone) .[name]... 1 two times a day prn   Problem # 2:  OSTEOPOROSIS (ICD-733.00) Assessment: Unchanged  Problem # 3:  HYPERLIPIDEMIA (ICD-272.4) Assessment: Unchanged On dietary suppl. Her updated medication list for this problem includes:    Lovaza 1 Gm Caps (Omega-3-acid ethyl esters) .[name]... 2 bid   Complete Medication List: 1)  Skelaxin 800 Mg Tabs (Metaxalone) .[name].. 1 two times a day prn  2)  Voltaren 1 % Gel (Diclofenac sodium) .... Two times a day  to qid as needed 3)  Lovaza 1 Gm Caps (Omega-3-acid ethyl esters) .... 2 bid    Patient Instructions: 1)  Please schedule a follow-up appointment in 3 months.    ]

## 2010-05-30 NOTE — Progress Notes (Signed)
Summary: lovaza  Phone Note From Pharmacy   [facility] Reason for Call: Needs renewal Summary of Call: Requesting renewal on LOvaza 1000 mg take 2 by mouth two times a day. Last filled 09/10/07 Initial call taken by: Orlan Leavens,  November 19, 2007 11:06 AM    New/Updated Medications: LOVAZA 1 GM  CAPS (OMEGA-3-ACID ETHYL ESTERS) take 2 by mouth two times a day   Prescriptions: LOVAZA 1 GM  CAPS (OMEGA-3-ACID ETHYL ESTERS) take 2 by mouth two times a day  #120 x 1   Entered by:   Orlan Leavens   Authorized by:   Tresa Garter MD   Signed by:   Orlan Leavens on 11/19/2007   Method used:   Electronically sent to ...       [facility]       [address]       [place], [place]  [address]       Ph: [phone] or [phone]       Fax: [phone]   RxID:   [id number]

## 2010-05-30 NOTE — Letter (Signed)
Summary: Generic Letter  [facility]  [address] [place], [place] [address]   Phone: [phone]  Fax: [phone]    02/04/2007  Courtney [name] [address] [place], [place]  [address]  Dear [name],    Thank you for your letter! I'm sorry about the trouble you had. I'll pass you letter on to our office manager, Ms. Raliegh Scarlet Fritts.  GO AHEAD WITH ROLFING, IT IS OK!  Hope, you're better!       Sincerely,   Jacinta Shoe MD [facility]

## 2010-05-30 NOTE — Assessment & Plan Note (Signed)
Summary: 3 MTH FU-STC   Vital Signs:  Patient Profile:   60 Years Old Female Weight:      135 pounds Temp:     98.4 degrees F oral Pulse rate:   88 / minute BP sitting:   104 / 57  (left arm)  Vitals Entered By: Tora Perches (August 16, 2007 10:49 AM)             Is Patient Diabetic? No     Chief Complaint:  Multiple medical problems or concerns.  History of Present Illness: F/u ADD, OA. Was taking adderal to study, no side effects.    Current Allergies (reviewed today): ! LEVAQUIN ! PCN ! BIAXIN DARVOCET ZYPREXA (OLANZAPINE) CLARITIN  Past Medical History:    Reviewed history from 05/17/2007 and no changes required:       IBS       ADD       R shoulder pain       Hyperlipidemia       Osteoarthritis       Osteoporosis       Depression w/bipolar traits       Possible Celiac disease vs IBS                            GI  [name]       GYN  Myrene Buddy   Family History:    Reviewed history from 05/17/2007 and no changes required:       Family History of CAD Female 1st degree relative <60       F Cretzfeld- jacobs disease       ADD  Social History:    Reviewed history from 02/16/2007 and no changes required:       Occupation:  Loss adjuster, chartered. [employer]       Married       Former Smoker       Alcohol use-no       Regular exercise-yes    Review of Systems  The patient denies anorexia, fever, chest pain, and syncope.     Physical Exam  General:     Well-developed,well-nourished,in no acute distress; alert,appropriate and cooperative throughout examination Eyes:     No corneal or conjunctival inflammation noted. EOMI. Perrla. Funduscopic exam benign, without hemorrhages, exudates or papilledema. Vision grossly normal. Nose:     External nasal examination shows no deformity or inflammation. Nasal mucosa are pink and moist without lesions or exudates. Mouth:     Oral mucosa and oropharynx without lesions or exudates.  Teeth in good  repair. Neck:     No deformities, masses, or tenderness noted. Lungs:     Normal respiratory effort, chest expands symmetrically. Lungs are clear to auscultation, no crackles or wheezes. Heart:     Normal rate and regular rhythm. S1 and S2 normal without gallop, murmur, click, rub or other extra sounds. Abdomen:     Bowel sounds positive,abdomen soft and non-tender without masses, organomegaly or hernias noted.    Impression & Recommendations:  Problem # 1:  A D D (ICD-314.00) Assessment: Improved Increase dose of Adderal if tol. Risks vs benefits and controversies of a long term controlled substances use were discussed.   Problem # 2:  SHOULDER PAIN (ICD-719.41) Assessment: Improved  The following medications were removed from the medication list:    Skelaxin 800 Mg Tabs (Metaxalone) .[name]... 1 two times a day prn  Complete Medication List: 1)  Voltaren 1 % Gel (Diclofenac sodium) .... Two times a day  to qid as needed 2)  Fish Oil Oil (Fish oil) .[name].. 1 by mouth bid 3)  Folic Acid 1 Mg Tabs (Folic acid) .... Take 1 tab by mouth daily 4)  Vitamin D3 1000 Unit Tabs (Cholecalciferol) .[name].. 1 qd 5)  Adderall 20 Mg Tabs (Amphetamine-dextroamphetamine) .[name].. 1 by mouth bid 6)  Fluticasone Propionate 50 Mcg/act Susp (Fluticasone propionate) .... 2 sprays each nostril once daily 7)  Fexofenadine Hcl 180 Mg Tabs (Fexofenadine hcl) .[name].. 1 once daily as needed allergies   Patient Instructions: 1)  Please schedule a follow-up appointment in 4 months.    Prescriptions: FEXOFENADINE HCL 180 MG TABS (FEXOFENADINE HCL) 1 once daily as needed allergies  #30 x 6   Entered and Authorized by:   Tresa Garter MD   Signed by:   Tresa Garter MD on 08/16/2007   Method used:   Print then Give to Patient   RxID:   [id number] FLUTICASONE PROPIONATE 50 MCG/ACT  SUSP (FLUTICASONE PROPIONATE) 2 sprays each nostril once daily  #1 vial x 3   Entered and Authorized by:   Tresa Garter MD   Signed by:   Tresa Garter MD on 08/16/2007   Method used:   Print then Give to Patient   RxID:   [id number] ADDERALL 20 MG TABS (AMPHETAMINE-DEXTROAMPHETAMINE) 1 by mouth bid  #60 x 0   Entered and Authorized by:   Tresa Garter MD   Signed by:   Tresa Garter MD on 08/16/2007   Method used:   Print then Give to Patient   RxID:   [id number]  ]

## 2010-05-30 NOTE — Assessment & Plan Note (Signed)
Summary: FU---$50----STC   Vital Signs:  Patient Profile:   60 Years Old Female Weight:      136 pounds Temp:     97.3 degrees F oral Pulse rate:   76 / minute BP sitting:   106 / 68  (left arm)  Vitals Entered By: Tora Perches (December 28, 2007 9:17 AM)                 Chief Complaint:  Multiple medical problems or concerns.  History of Present Illness: F/u ADD. Starting [employer] soon. Co lack of Libido     Current Allergies (reviewed today): ! LEVAQUIN ! PCN ! BIAXIN DARVOCET ZYPREXA (OLANZAPINE) CLARITIN  Past Medical History:    Reviewed history from 05/17/2007 and no changes required:       IBS       ADD       R shoulder pain       Hyperlipidemia       Osteoarthritis       Osteoporosis       Depression w/bipolar traits       Possible Celiac disease vs IBS                            GI  [name]       GYN  Myrene Buddy   Family History:    Reviewed history from 05/17/2007 and no changes required:       Family History of CAD Female 1st degree relative <60       F Cretzfeld- jacobs disease       ADD  Social History:    Reviewed history from 02/16/2007 and no changes required:       Occupation:  Loss adjuster, chartered. [employer]       Married       Former Smoker       Alcohol use-no       Regular exercise-yes    Review of Systems  The patient denies fever, chest pain, headaches, abdominal pain, and melena.     Physical Exam  General:     Well-developed,well-nourished,in no acute distress; alert,appropriate and cooperative throughout examination Eyes:     No corneal or conjunctival inflammation noted. EOMI. Perrla. Funduscopic exam benign, without hemorrhages, exudates or papilledema. Vision grossly normal. Mouth:     Oral mucosa and oropharynx without lesions or exudates.  Teeth in good repair. Neck:     No deformities, masses, or tenderness noted. Lungs:     Normal respiratory effort, chest expands symmetrically. Lungs are clear to  auscultation, no crackles or wheezes. Heart:     Normal rate and regular rhythm. S1 and S2 normal without gallop, murmur, click, rub or other extra sounds. Abdomen:     Bowel sounds positive,abdomen soft and non-tender without masses, organomegaly or hernias noted. Msk:     No deformity or scoliosis noted of thoracic or lumbar spine.   Pulses:     R and L carotid,radial,femoral,dorsalis pedis and posterior tibial pulses are full and equal bilaterally Extremities:     No clubbing, cyanosis, edema, or deformity noted with normal full range of motion of all joints.   Neurologic:     No cranial nerve deficits noted. Station and gait are normal. Plantar reflexes are down-going bilaterally. DTRs are symmetrical throughout. Sensory, motor and coordinative functions appear intact. Skin:     Intact without suspicious lesions or  rashes Psych:     Cognition and judgment appear intact. Alert and cooperative with normal attention span and concentration. No apparent delusions, illusions, hallucinations    Impression & Recommendations:  Problem # 1:  A D D (ICD-314.00) Assessment: Unchanged Given Rx Risks vs benefits and controversies of a long term controlled substances use were discussed.   Problem # 2:  MENOPAUSAL SYNDROME (ICD-627.0), dryness Assessment: Unchanged  Her updated medication list for this problem includes:    Premarin 0.625 Mg/gm Crea (Estrogens, conjugated) .[name]... 1 g pv weekly >20 min  Problem # 3:  HYPERLIPIDEMIA (ICD-272.4) Assessment: Improved  Her updated medication list for this problem includes:    Lovaza 1 Gm Caps (Omega-3-acid ethyl esters) .[name]... Take 2 by mouth two times a day   Problem # 4:  OSTEOARTHRITIS (ICD-715.90) Assessment: Improved Lovaza seems to help  Complete Medication List: 1)  Voltaren 1 % Gel (Diclofenac sodium) .... Two times a day  to qid as needed 2)  Fish Oil Oil (Fish oil) .[name].. 1 by mouth bid 3)  Folic Acid 1 Mg Tabs (Folic acid) ....  Take 1 tab by mouth daily 4)  Vitamin D3 1000 Unit Tabs (Cholecalciferol) .[name].. 1 qd 5)  Adderall 20 Mg Tabs (Amphetamine-dextroamphetamine) .[name].. 1 by mouth bid 6)  Lovaza 1 Gm Caps (Omega-3-acid ethyl esters) .... Take 2 by mouth two times a day 7)  Premarin 0.625 Mg/gm Crea (Estrogens, conjugated) .[name].. 1 g pv weekly   Patient Instructions: 1)  L arginine (Arginmax) [email or web address] 2)  Please schedule a follow-up appointment in 6 months wellness with labs.   Prescriptions: PREMARIN 0.625 MG/GM CREA (ESTROGENS, CONJUGATED) 1 g PV weekly  #45 g x 12   Entered and Authorized by:   Tresa Garter MD   Signed by:   Tresa Garter MD on 12/31/2007   Method used:   Electronically to        [facility] (retail)       [address]       [place], [place]  [address]       Ph: [phone] or [phone]       Fax: [phone]   RxID:   [id number] LOVAZA 1 GM  CAPS (OMEGA-3-ACID ETHYL ESTERS) take 2 by mouth two times a day  #120 x 12   Entered and Authorized by:   Tresa Garter MD   Signed by:   Tresa Garter MD on 12/31/2007   Method used:   Electronically to        [facility] (retail)       [address]       [place], [place]  [address]       Ph: [phone] or [phone]       Fax: [phone]   RxID:   [id number] FISH OIL   OIL (FISH OIL) 1 by mouth bid  #120 x 12   Entered and Authorized by:   Tresa Garter MD   Signed by:   Tresa Garter MD on 12/31/2007   Method used:   Print then Give to Patient   RxID:   [id number] ADDERALL 20 MG TABS (AMPHETAMINE-DEXTROAMPHETAMINE) 1 by mouth bid  #60 x 0   Entered and Authorized by:   Tresa Garter MD   Signed by:   Tresa Garter MD on 12/28/2007  Method used:   Print then Give to Patient   RxID:   [id number] FOLIC ACID 1 MG TABS (FOLIC ACID) Take 1 tab by  mouth daily  #30 x 12   Entered and Authorized by:   Tresa Garter MD   Signed by:   Tresa Garter MD on 12/28/2007   Method used:   Electronically to        [facility] (retail)       [address]       [place], [place]  [address]       Ph: [phone] or [phone]       Fax: [phone]   RxID:   [id number] PREMARIN 0.625 MG/GM CREA (ESTROGENS, CONJUGATED) 1 g PV weekly  #45 g x 12   Entered and Authorized by:   Tresa Garter MD   Signed by:   Tresa Garter MD on 12/28/2007   Method used:   Electronically to        [facility] (retail)       [address]       [place], [place]  [address]       Ph: [phone] or [phone]       Fax: [phone]   RxID:   [id number]  ]

## 2010-05-30 NOTE — Progress Notes (Signed)
Summary: Adderall prior auth  Phone Note From Pharmacy   Caller: [facility] [phone] Summary of Call: Per pharmacy, patient insurance requires prior auth for Adderall. Called insurance  and patient now approved for Adderall x 1year. Pharmacy notified via fax.  Initial call taken by: Rock Nephew CMA,  May 20, 2007 2:04 PM

## 2010-05-30 NOTE — Progress Notes (Signed)
Summary: ABX?   Phone Note Call from Patient Call back at [facility] Phone [phone]   Summary of Call: Pt has been taking antibiotic since last wednesday. She does not feel that it is working. She starting to feel better but thinks that the antibiotic is not the reason. Should she stop antibiotic? start a different med? or continue what she is taking?  Initial call taken by: Lamar Sprinkles, CMA,  May 07, 2009 10:38 AM  Follow-up for Phone Call        i woud finish abx Follow-up by: Tresa Garter MD,  May 07, 2009 12:07 PM  Additional Follow-up for Phone Call Additional follow up Details #1::        pt is aware/vg Additional Follow-up by: Tora Perches,  May 07, 2009 12:29 PM

## 2010-05-30 NOTE — Consult Note (Signed)
Summary: [facility]  [facility]   Imported By: Esmeralda Links D'jimraou 06/03/2007 15:27:45  _____________________________________________________________________  External Attachment:    Type:   Image     Comment:   External Document

## 2010-05-30 NOTE — Assessment & Plan Note (Signed)
Summary: HOARSE--SORE THROAT--CHEST PAIN ACROSS BACK----STC   Vital Signs:  Patient profile:   60 year old female Height:      62 inches Weight:      118 pounds BMI:     21.66 Temp:     98.3 degrees F oral Pulse rate:   107 / minute BP sitting:   112 / 80  (left arm) CC: sore throat  Is Patient Diabetic? No   CC:  sore throat .  History of Present Illness: The patient presents with complaints of sore throat, fever, cough, sinus congestion and drainge of several days duration. Not better with OTC meds. Chest hurts with coughing. Can't sleep due to cough. Muscle aches are present.  The mucus is colored. C/o rash on abd C/o wt loss x 12 months, no pain  Preventive Screening-Counseling & Management  Alcohol-Tobacco     Smoking Status: quit  Current Medications (verified): 1)  Voltaren 1 %  Gel (Diclofenac Sodium) .... Two Times A Day  To Qid As Needed 2)  Folic Acid 1 Mg Tabs (Folic Acid) .... Take 1 Tab By Mouth Daily 3)  Adderall 20 Mg Tabs (Amphetamine-Dextroamphetamine) .[name].. 1 By Mouth Bid 4)  Lovaza 1 Gm  Caps (Omega-3-Acid Ethyl Esters) .... Take 2 By Mouth Two Times A Day 5)  Premarin 0.625 Mg/gm Crea (Estrogens, Conjugated) .[name].. 1 G Pv Weekly 6)  First-Testosterone Mc 2 % Crea (Testosterone Propionate) .... Compound Vag Cream (Dr Thomasena Edis) 7)  Diazepam 5 Mg Tabs (Diazepam) .[name].. 1 By Mouth Two Times A Day As Needed Anxiety 8)  Vitamin D3 1000 Unit  Tabs (Cholecalciferol) .[name].. 1 Qd 9)  Aspirin 81 Mg  Tbec (Aspirin) .... One By Mouth Every Day  Allergies: 1)  ! Levaquin 2)  Darvocet 3)  Zyprexa (Olanzapine) 4)  Biaxin 5)  Claritin  Past History:  Social History: Last updated: 02/16/2007 Occupation:  Loss adjuster, chartered. [employer] Married Former Smoker Alcohol use-no Regular exercise-yes  Past Medical History: IBS ADD R shoulder pain Hyperlipidemia Osteoarthritis Osteoporosis Depression ( no bipolar depression per Dr Nolen Mu) Possible Celiac disease vs IBS GI   [name]  Myrene Buddy Anxiety  Review of Systems       The patient complains of weight loss.  The patient denies anorexia, fever, weight gain, vision loss, decreased hearing, hoarseness, chest pain, syncope, dyspnea on exertion, peripheral edema, prolonged cough, headaches, hemoptysis, abdominal pain, melena, hematochezia, severe indigestion/heartburn, hematuria, incontinence, genital sores, muscle weakness, suspicious skin lesions, transient blindness, difficulty walking, depression, unusual weight change, abnormal bleeding, enlarged lymph nodes, angioedema, and breast masses.    Physical Exam  General:  Well-developed,thin ,in no acute distress; alert,appropriate and cooperative throughout examination, hoarse Nose:  External nasal examination shows no deformity or inflammation. Nasal mucosa are pink and moist without lesions or exudates. Mouth:  Oral mucosa and oropharynx without lesions or exudates.  Teeth in good repair. Neck:  possible R bruit Lungs:  Normal respiratory effort, chest expands symmetrically. Lungs are clear to auscultation, no crackles or wheezes. Heart:  Normal rate and regular rhythm. S1 and S2 normal without gallop, murmur, click, rub or other extra sounds. Abdomen:  Bowel sounds positive,abdomen soft and non-tender without masses, organomegaly or hernias noted. Msk:  No deformity or scoliosis noted of thoracic or lumbar spine.   Neurologic:  No cranial nerve deficits noted. Station and gait are normal. Plantar reflexes are down-going bilaterally. DTRs are symmetrical throughout. Sensory, motor and coordinative functions appear intact. Skin:  some AKs  on face; fractured L gr toenail Psych:  Cognition and judgment appear intact. Alert and cooperative with normal attention span and concentration. No apparent delusions, illusions, hallucinations   Impression & Recommendations:  Problem # 1:  WEIGHT LOSS (ICD-783.21) Assessment  Deteriorated  Orders: TLB-B12, Serum-Total ONLY (16109-U04) TLB-BMP (Basic Metabolic Panel-BMET) (80048-METABOL) TLB-CBC Platelet - w/Differential (85025-CBCD) TLB-Hepatic/Liver Function Pnl (80076-HEPATIC) TLB-Lipase (83690-LIPASE) TLB-Sedimentation Rate (ESR) (85652-ESR) TLB-TSH (Thyroid Stimulating Hormone) (84443-TSH) TLB-Udip ONLY (81003-UDIP)  Problem # 2:  SKIN RASH (ICD-782.1)? etiol - r/o D. herpetiformis Assessment: New Skin biopsy if reoccures Her updated medication list for this problem includes:    Triamcinolone Acetonide 0.5 % Crea (Triamcinolone acetonide) ..... Use two times a day prn  Orders: TLB-B12, Serum-Total ONLY (54098-J19) TLB-BMP (Basic Metabolic Panel-BMET) (80048-METABOL) TLB-CBC Platelet - w/Differential (85025-CBCD) TLB-Hepatic/Liver Function Pnl (80076-HEPATIC) TLB-Lipase (83690-LIPASE) TLB-Sedimentation Rate (ESR) (85652-ESR) TLB-TSH (Thyroid Stimulating Hormone) (84443-TSH) TLB-Udip ONLY (81003-UDIP)  Problem # 3:  DEPRESSION (ICD-311) Assessment: Comment Only She said Dr Nolen Mu stated - "not a bipolar" The following medications were removed from the medication list:    Diazepam 5 Mg Tabs (Diazepam) .[name]... 1 by mouth two times a day as needed anxiety Her updated medication list for this problem includes:    Lorazepam 0.5 Mg Tabs (Lorazepam) .[name]... 1 by mouth two times a day as needed anxiety  Problem # 4:  HYPERLIPIDEMIA (ICD-272.4) Assessment: Comment Only  Her updated medication list for this problem includes:    Lovaza 1 Gm Caps (Omega-3-acid ethyl esters) .[name]... Take 2 by mouth two times a day  Problem # 5:  UPPER RESPIRATORY INFECTION, ACUTE (ICD-465.9) Assessment: New  Her updated medication list for this problem includes:    Aspirin 81 Mg Tbec (Aspirin) ..... One by mouth every day    Tessalon Perles 100 Mg Caps (Benzonatate) .[name]... 1-2 by mouth two times a day as needed cogh    Tussicaps 10-8 Mg Xr12h-cap (Hydrocod polst-chlorphen  polst) .[name]... 1 by mouth two times a day as needed cough  Complete Medication List: 1)  Lovaza 1 Gm Caps (Omega-3-acid ethyl esters) .... Take 2 by mouth two times a day 2)  First-testosterone Mc 2 % Crea (Testosterone propionate) .... Compound vag cream (dr Thomasena Edis) 3)  Vitamin D3 1000 Unit Tabs (Cholecalciferol) .[name].. 1 qd 4)  Aspirin 81 Mg Tbec (Aspirin) .... One by mouth every day 5)  Ceftin 500 Mg Tabs (Cefuroxime axetil) .[name].. 1 by mouth bid 6)  Tessalon Perles 100 Mg Caps (Benzonatate) .[name].. 1-2 by mouth two times a day as needed cogh 7)  Tussicaps 10-8 Mg Xr12h-cap (Hydrocod polst-chlorphen polst) .[name].. 1 by mouth two times a day as needed cough 8)  Triamcinolone Acetonide 0.5 % Crea (Triamcinolone acetonide) .... Use two times a day prn 9)  Lorazepam 0.5 Mg Tabs (Lorazepam) .[name].. 1 by mouth two times a day as needed anxiety  Patient Instructions: 1)  Voice rest 2)  Call if you are not better in a reasonable amount of time or if worse.  3)  Please schedule a follow-up appointment in 1 month. Prescriptions: LORAZEPAM 0.5 MG TABS (LORAZEPAM) 1 by mouth two times a day as needed anxiety  #60 x 3   Entered and Authorized by:   Tresa Garter MD   Signed by:   Tresa Garter MD on 05/03/2009   Method used:   Print then Give to Patient   RxID:   [id number] TRIAMCINOLONE ACETONIDE 0.5 % CREA (TRIAMCINOLONE ACETONIDE) use two times a day prn  #  120 g x 3   Entered and Authorized by:   Tresa Garter MD   Signed by:   Tresa Garter MD on 05/03/2009   Method used:   Electronically to        [facility] (retail)       [address]       [place], [place]  [address]       Ph: [phone] or [phone]       Fax: [phone]   RxID:   [id number] TUSSICAPS 10-8 MG XR12H-CAP (HYDROCOD POLST-CHLORPHEN POLST) 1 by mouth two times a day as needed cough  #20 x 0   Entered and Authorized by:   Tresa Garter  MD   Signed by:   Tresa Garter MD on 05/03/2009   Method used:   Print then Give to Patient   RxID:   [id number] LOVAZA 1 GM  CAPS (OMEGA-3-ACID ETHYL ESTERS) take 2 by mouth two times a day  #120 x 12   Entered and Authorized by:   Tresa Garter MD   Signed by:   Tresa Garter MD on 05/03/2009   Method used:   Electronically to        [facility] (retail)       [address]       [place], [place]  [address]       Ph: [phone] or [phone]       Fax: [phone]   RxID:   [id number] TESSALON PERLES 100 MG CAPS (BENZONATATE) 1-2 by mouth two times a day as needed cogh  #120 x 1   Entered and Authorized by:   Tresa Garter MD   Signed by:   Tresa Garter MD on 05/03/2009   Method used:   Electronically to        [facility] (retail)       [address]       [place], [place]  [address]       Ph: [phone] or [phone]       Fax: [phone]   RxID:   [id number] CEFTIN 500 MG TABS (CEFUROXIME AXETIL) 1 by mouth bid  #20 x 1   Entered and Authorized by:   Tresa Garter MD   Signed by:   Tresa Garter MD on 05/03/2009   Method used:   Electronically to        [facility] (retail)       [address]       [place], [place]  [address]       Ph: [phone] or [phone]       Fax: [phone]   RxID:   [id number]

## 2010-05-30 NOTE — Progress Notes (Signed)
Summary: refill  Phone Note Refill Request Message from:  Fax from Pharmacy on February 07, 2009 3:19 PM  Refills Requested: Medication #1:  FOLIC ACID 1 MG TABS Take 1 tab by mouth daily Initial call taken by: Rock Nephew CMA,  February 07, 2009 3:19 PM    Prescriptions: FOLIC ACID 1 MG TABS (FOLIC ACID) Take 1 tab by mouth daily  #30 x 12   Entered by:   Rock Nephew CMA   Authorized by:   Tresa Garter MD   Signed by:   Rock Nephew CMA on 02/07/2009   Method used:   Electronically to        [facility] (retail)       [address]       [place], [place]  [address]       Ph: [phone] or [phone]       Fax: [phone]   RxID:   [id number]

## 2010-05-30 NOTE — Letter (Signed)
Summary: missed appt charges letter from pt/[facility]  missed appt charges letter from [facility]   Imported By: Lester Greenlawn 02/29/2008 11:30:43  _____________________________________________________________________  External Attachment:    Type:   Image     Comment:   External Document

## 2010-05-30 NOTE — Assessment & Plan Note (Signed)
Summary: discuss add meds SD   Vital Signs:  Patient Profile:   60 Years Old Female Weight:      136 pounds Temp:     97.5 degrees F oral Pulse rate:   86 / minute BP sitting:   110 / 63  (left arm)  Vitals Entered By: Tora Perches (May 17, 2007 1:33 PM)             Is Patient Diabetic? No     Chief Complaint:  Multiple medical problems or concerns.  History of Present Illness: The patient presents for a follow up of shoulder pain, anxiety, depression and labs. Questioning if she could have ADD.  Thinking of going back to [place] [place]. school.   Current Allergies: ! LEVAQUIN ! PCN ! BIAXIN DARVOCET ZYPREXA (OLANZAPINE)  Past Medical History:    Reviewed history from 02/16/2007 and no changes required:       IBS       ADD       R shoulder pain       Hyperlipidemia       Osteoarthritis       Osteoporosis       Depression w/bipolar traits       Possible Celiac disease vs IBS                            GI  [name]       GYN  Myrene Buddy   Family History:    Reviewed history from 02/16/2007 and no changes required:       Family History of CAD Female 1st degree relative <60       F Cretzfeld- jacobs disease       ADD  Social History:    Reviewed history from 02/16/2007 and no changes required:       Occupation:  Loss adjuster, chartered. [employer]       Married       Former Smoker       Alcohol use-no       Regular exercise-yes     Review of Systems  The patient denies weight loss, chest pain, peripheral edema, prolonged cough, hemoptysis, melena, severe indigestion/heartburn, hematuria, and incontinence.     Physical Exam  General:     Well-developed,well-nourished,in no acute distress; alert,appropriate and cooperative throughout examination Eyes:     No corneal or conjunctival inflammation noted. EOMI. Perrla. Funduscopic exam benign, without hemorrhages, exudates or papilledema. Vision grossly normal. Nose:     External nasal examination shows no  deformity or inflammation. Nasal mucosa are pink and moist without lesions or exudates. Mouth:     Oral mucosa and oropharynx without lesions or exudates.  Teeth in good repair. Neck:     No deformities, masses, or tenderness noted. Lungs:     Normal respiratory effort, chest expands symmetrically. Lungs are clear to auscultation, no crackles or wheezes. Heart:     Normal rate and regular rhythm. S1 and S2 normal without gallop, murmur, click, rub or other extra sounds. Abdomen:     Bowel sounds positive,abdomen soft and non-tender without masses, organomegaly or hernias noted. Neurologic:     No cranial nerve deficits noted. Station and gait are normal. Plantar reflexes are down-going bilaterally. DTRs are symmetrical throughout. Sensory, motor and coordinative functions appear intact. Skin:     Intact without suspicious lesions or rashes Psych:     Oriented X3 and  good eye contact.      Impression & Recommendations:  Problem # 1:  HYPERLIPIDEMIA (ICD-272.4) Mild. Fish oil. The following medications were removed from the medication list:    Lovaza 1 Gm Caps (Omega-3-acid ethyl esters) .[name]... 2 bid   Problem # 2:  OSTEOPOROSIS (ICD-733.00) Vit D. Her updated medication list for this problem includes:    Vitamin D3 1000 Unit Tabs (Cholecalciferol) .[name]... 1 qd   Problem # 3:  OSTEOARTHRITIS (ICD-715.90) Assessment: Unchanged  Problem # 4:  SHOULDER PAIN (ICD-719.41) R  Her updated medication list for this problem includes:    Skelaxin 800 Mg Tabs (Metaxalone) .[name]... 1 two times a day prn   Problem # 5:  A D D (ICD-314.00) Adderall as needed to study. Risks vs benefits and controversies of a long term controlled substances use were discussed. Risks, ie CVA, MI are discussed.   Problem # 6:  IRRITABLE BOWEL SYNDROME (ICD-564.1) Celiac serol. was neg  Complete Medication List: 1)  Skelaxin 800 Mg Tabs (Metaxalone) .[name].. 1 two times a day prn 2)  Voltaren 1 % Gel (Diclofenac  sodium) .... Two times a day  to qid as needed 3)  Fish Oil Oil (Fish oil) .[name].. 1 by mouth bid 4)  Folic Acid 1 Mg Tabs (Folic acid) .... Take 1 tab by mouth daily 5)  Vitamin D3 1000 Unit Tabs (Cholecalciferol) .[name].. 1 qd 6)  Adderall 10 Mg Tabs (Amphetamine-dextroamphetamine) .[name].. 1 by mouth bid   Patient Instructions: 1)  Please schedule a follow-up appointment in 3 months.    Prescriptions: ADDERALL 10 MG  TABS (AMPHETAMINE-DEXTROAMPHETAMINE) 1 by mouth bid  #60 x 0   Entered and Authorized by:   Tresa Garter MD   Signed by:   Tresa Garter MD on 05/17/2007   Method used:   Print then Give to Patient   RxID:   [id number]  ]

## 2010-06-05 ENCOUNTER — Encounter: Payer: Self-pay | Admitting: Internal Medicine

## 2010-06-05 ENCOUNTER — Other Ambulatory Visit: Payer: Self-pay | Admitting: Internal Medicine

## 2010-06-05 ENCOUNTER — Ambulatory Visit (INDEPENDENT_AMBULATORY_CARE_PROVIDER_SITE_OTHER)
Admission: RE | Admit: 2010-06-05 | Discharge: 2010-06-05 | Disposition: A | Discharge status: Home / Self Care | Payer: BC Managed Care – PPO | Source: Ambulatory Visit | Attending: Internal Medicine | Admitting: Internal Medicine

## 2010-06-05 ENCOUNTER — Encounter (INDEPENDENT_AMBULATORY_CARE_PROVIDER_SITE_OTHER): Payer: BC Managed Care – PPO | Admitting: Internal Medicine

## 2010-06-05 DIAGNOSIS — M25559 Pain in unspecified hip: Secondary | ICD-10-CM

## 2010-06-05 DIAGNOSIS — R000 Tachycardia, unspecified: Secondary | ICD-10-CM | POA: Insufficient documentation

## 2010-06-05 DIAGNOSIS — Z0000 Encounter for general adult medical examination without abnormal findings: Secondary | ICD-10-CM

## 2010-06-05 DIAGNOSIS — R030 Elevated blood-pressure reading, without diagnosis of hypertension: Secondary | ICD-10-CM | POA: Insufficient documentation

## 2010-06-05 NOTE — Progress Notes (Signed)
Summary: LABS  Phone Note Call from Patient Call back at Home Phone [phone] [facility]   Call back at Work Phone [phone]   Summary of Call: Patient is requesting to know what labs MD ordered for her at last office visit. I see no labs requested at last office visit 04/2009. What do you want pt to have prior to next office visit?    Initial call taken by: Lamar Sprinkles, CMA,  May 22, 2010 2:47 PM  Follow-up for Phone Call        We can order CBC, TSH, BMET, Hepatic panel, UA, Lipids, Dx: V70.0 Follow-up by: Tresa Garter MD,  May 22, 2010 4:53 PM  Additional Follow-up for Phone Call Additional follow up Details #1::        Patient is requesting also:  Vit D C-reactive protien Folate  OK?   She is on generic effexor and adderall from [facility] LIST UPDATED. Additional Follow-up by: Lamar Sprinkles, CMA,  May 23, 2010 3:50 PM    Additional Follow-up for Phone Call Additional follow up Details #2::    Yes Thank you! Dx 272.0 780.79 Follow-up by: Tresa Garter MD,  May 24, 2010 1:28 PM  New/Updated Medications: EFFEXOR XR 37.5 MG XR24H-CAP (VENLAFAXINE HCL) 1 once daily ADDERALL 10 MG TABS (AMPHETAMINE-DEXTROAMPHETAMINE) 1 up to four times a day

## 2010-06-13 NOTE — Assessment & Plan Note (Signed)
Summary: cpx/#bcbs   Vital Signs:  Patient profile:   60 year old female Height:      62 inches Weight:      119 pounds BMI:     21.84 Temp:     98.5 degrees F oral Pulse rate:   108 / minute Pulse rhythm:   regular Resp:     16 per minute BP sitting:   142 / 90  (left arm) Cuff size:   regular  Vitals Entered By: Lanier Prude, Beverly Gust) (June 05, 2010 3:36 PM) CC: CPX Is Patient Diabetic? No Comments pt is not taking Lovaza, First Tesosterone, Aspirin, Tessalon Pearles or Tussicaps   CC:  CPX.  History of Present Illness: The patient presents for a preventive health examination  C/o L hip pain worse w/walking and gym exercises  Current Medications (verified): 1)  Lovaza 1 Gm  Caps (Omega-3-Acid Ethyl Esters) .... Take 2 By Mouth Two Times A Day 2)  First-Testosterone Mc 2 % Crea (Testosterone Propionate) .... Compound Vag Cream (Dr Thomasena Edis) 3)  Vitamin D3 1000 Unit  Tabs (Cholecalciferol) .[name].. 1 Qd 4)  Aspirin 81 Mg  Tbec (Aspirin) .... One By Mouth Every Day 5)  Tessalon Perles 100 Mg Caps (Benzonatate) .[name].. 1-2 By Mouth Two Times A Day As Needed Cogh 6)  Tussicaps 10-8 Mg Xr12h-Cap (Hydrocod Polst-Chlorphen Polst) .[name].. 1 By Mouth Two Times A Day As Needed Cough 7)  Triamcinolone Acetonide 0.5 % Crea (Triamcinolone Acetonide) .... Use Two Times A Day Prn 8)  Lorazepam 0.5 Mg Tabs (Lorazepam) .[name].. 1 By Mouth Two Times A Day As Needed Anxiety 9)  Effexor Xr 37.5 Mg Xr24h-Cap (Venlafaxine Hcl) .[name].. 1 Once Daily 10)  Adderall 10 Mg Tabs (Amphetamine-Dextroamphetamine) .[name].. 1 Up To Four Times A Day 11)  Calcium 1500 Mg Tabs (Calcium Carbonate) .[name].. 1 By Mouth Once Daily  Allergies (verified): 1)  ! Levaquin 2)  Darvocet 3)  Zyprexa (Olanzapine) 4)  Biaxin 5)  Claritin  Past History:  Past Surgical History: Last updated: 02/16/2007 Total hip replacement R 2002  Family History: Last updated: 05/17/2007 Family History of CAD Female 1st degree relative <60 F  Cretzfeld- jacobs disease ADD  Social History: Last updated: 02/16/2007 Occupation:  Loss adjuster, chartered. [employer] Married Former Smoker Alcohol use-no Regular exercise-yes  Review of Systems       The patient complains of difficulty walking.  The patient denies anorexia, fever, weight loss, weight gain, vision loss, decreased hearing, hoarseness, chest pain, syncope, dyspnea on exertion, peripheral edema, prolonged cough, headaches, hemoptysis, abdominal pain, melena, hematochezia, severe indigestion/heartburn, hematuria, incontinence, genital sores, muscle weakness, suspicious skin lesions, transient blindness, unusual weight change, abnormal bleeding, enlarged lymph nodes, angioedema, and breast masses.    Physical Exam  General:  Well-developed,thin ,in no acute distress; alert,appropriate and cooperative throughout examination Head:  Normocephalic and atraumatic without obvious abnormalities. No apparent alopecia or balding. Eyes:  No corneal or conjunctival inflammation noted. EOMI. Perrla Ears:  External ear exam shows no significant lesions or deformities.  Otoscopic examination reveals clear canals, tympanic membranes are intact bilaterally without bulging, retraction, inflammation or discharge. Hearing is grossly normal bilaterally. Nose:  External nasal examination shows no deformity or inflammation. Nasal mucosa are pink and moist without lesions or exudates. Mouth:  Oral mucosa and oropharynx without lesions or exudates.  Teeth in good repair. Neck:  possible R bruit Lungs:  Normal respiratory effort, chest expands symmetrically. Lungs are clear to auscultation, no crackles or wheezes. Heart:  Normal rate  and regular rhythm. S1 and S2 normal without gallop, murmur, click, rub or other extra sounds. Abdomen:  Bowel sounds positive,abdomen soft and non-tender without masses, organomegaly or hernias noted. Msk:  No deformity or scoliosis noted of thoracic or lumbar spine.   Extremities:  No  clubbing, cyanosis, edema, or deformity noted with normal full range of motion of all joints.   Neurologic:  No cranial nerve deficits noted. Station and gait are normal. Plantar reflexes are down-going bilaterally. DTRs are symmetrical throughout. Sensory, motor and coordinative functions appear intact. Skin:  some AKs on face; fractured L gr toenail Cervical Nodes:  No lymphadenopathy noted Inguinal Nodes:  No significant adenopathy Psych:  Cognition and judgment appear intact. Alert and cooperative with normal attention span and concentration. No apparent delusions, illusions, hallucinations   Impression & Recommendations:  Problem # 1:  HEALTH MAINTENANCE EXAM (ICD-V70.0) Assessment New Health and age related issues were discussed. Available screening tests and vaccinations were discussed as well. Healthy life style including good diet and exercise was discussed.  The labs were reviewed with the patient.  GYN q 12 months   Problem # 2:  ANXIETY (ICD-300.00) Assessment: Improved  Her updated medication list for this problem includes:    Lorazepam 0.5 Mg Tabs (Lorazepam) .[name]... 1 by mouth two times a day as needed anxiety    Effexor Xr 37.5 Mg Xr24h-cap (Venlafaxine hcl) .[name]... 1 once daily  Problem # 3:  ELEVATED BLOOD PRESSURE (ICD-796.2) Assessment: Comment Only Poss due to meds Rechecked BP 125/85  Problem # 4:  TACHYCARDIA (ICD-785.0) - as per #3 Assessment: Comment Only Rechecked HR 78-80  Complete Medication List: 1)  Lovaza 1 Gm Caps (Omega-3-acid ethyl esters) .... Take 2 by mouth two times a day 2)  Vitamin D3 1000 Unit Tabs (Cholecalciferol) .[name].. 1 qd 3)  Aspirin 81 Mg Tbec (Aspirin) .... One by mouth every day 4)  Triamcinolone Acetonide 0.5 % Crea (Triamcinolone acetonide) .... Use two times a day prn 5)  Lorazepam 0.5 Mg Tabs (Lorazepam) .[name].. 1 by mouth two times a day as needed anxiety 6)  Effexor Xr 37.5 Mg Xr24h-cap (Venlafaxine hcl) .[name].. 1 once daily 7)   Adderall 10 Mg Tabs (Amphetamine-dextroamphetamine) .[name].. 1 up to four times a day 8)  Calcium 1500 Mg Tabs (Calcium carbonate) .[name].. 1 by mouth once daily 9)  Pennsaid 1.5 % Soln (Diclofenac sodium) .... 3-5 gtt on skin three times a day for pain  Other Orders: T-Hip Comp Left Min 2-views (73510TC) Sports Medicine (Sports Med)  Patient Instructions: 1)  Please schedule a follow-up appointment in 2 months. 2)  Check BP and pulse several times 3)  Normal BP<135/85 4)  Normal HR 70-80 5)  Please schedule a follow-up appointment in 3 months. Prescriptions: PENNSAID 1.5 % SOLN (DICLOFENAC SODIUM) 3-5 gtt on skin three times a day for pain  #1 x 3   Entered and Authorized by:   Tresa Garter MD   Signed by:   Tresa Garter MD on 06/05/2010   Method used:   Print then Give to Patient   RxID:   [id number] LOVAZA 1 GM  CAPS (OMEGA-3-ACID ETHYL ESTERS) take 2 by mouth two times a day  #120 x 12   Entered and Authorized by:   Tresa Garter MD   Signed by:   Tresa Garter MD on 06/05/2010   Method used:   Print then Give to Patient   RxID:   [id number]  Orders Added: 1)  T-Hip Comp Left Min 2-views [73510TC] 2)  Sports Medicine [Sports Med] 3)  Est. Patient 40-64 years [phone]

## 2010-06-20 ENCOUNTER — Encounter: Payer: Self-pay | Admitting: Sports Medicine

## 2010-06-20 ENCOUNTER — Ambulatory Visit (INDEPENDENT_AMBULATORY_CARE_PROVIDER_SITE_OTHER): Payer: BC Managed Care – PPO | Admitting: Sports Medicine

## 2010-06-20 DIAGNOSIS — M169 Osteoarthritis of hip, unspecified: Secondary | ICD-10-CM

## 2010-06-20 DIAGNOSIS — M1610 Unilateral primary osteoarthritis, unspecified hip: Secondary | ICD-10-CM | POA: Insufficient documentation

## 2010-06-20 DIAGNOSIS — M25559 Pain in unspecified hip: Secondary | ICD-10-CM

## 2010-06-25 NOTE — Assessment & Plan Note (Signed)
Summary: NP,L HIP [facility] [phone]   Vital Signs:  Patient profile:   60 year old female Height:      61 inches Weight:      110 pounds BP sitting:   109 / 71  Vitals Entered By: Lillia Pauls CMA (June 20, 2010 3:07 PM)  History of Present Illness: RT hip pain started years ago never quite clear what triggered this wound up having THR on RT in 2002 by Dr Sherlean Foot  Now having left hip pain Rolfing helps left hip for several days difficult to get deep into buttocks this pain has increased over the last year more over last 6 mos w some equipment use at gym  also has some left knee pain  has been afraid to do much on left hip no tennis she did some skiing but became afraid then became a hiker and has done as much as 12 miles  comes for opintion  Allergies: 1)  ! Levaquin 2)  Darvocet 3)  Zyprexa (Olanzapine) 4)  Biaxin 5)  Claritin  Physical Exam  General:  Well-developed,well-nourished,in no acute distress; alert,appropriate and cooperative throughout examination Msk:  RT hip shows normal hip rotation of 65 deg seated good strength on flexion, abduction, adduction norm leg length  LT hip shows limited rotation of 15 deg int and 30 ER hip flexion is full and strong hip abduction and adduction strong FABER limted on left FADIR is painful Left SI joint is fixed Additional Exam:  XRays cystic change at inferior rim of left hip mod arthtritis at sup and inf margins  central shows some cartilage fem head is still fairly smooth w 1 area of irreg  left SIJ is fused superiorly   Impression & Recommendations:  Problem # 1:  HIP PAIN (ICD-719.45)  Her updated medication list for this problem includes:    Aspirin 81 Mg Tbec (Aspirin) ..... One by mouth every day    Tramadol Hcl 50 Mg Tabs (Tramadol hcl) .[name]... 1 by mouth q 6hrs as needed pain  this is more w specific activity and not too much at rest hurts ot roll over at sleep  Problem # 2:   OSTEOARTHRITIS, HIPS, BILATERAL (ICD-715.95)  Her updated medication list for this problem includes:    Aspirin 81 Mg Tbec (Aspirin) ..... One by mouth every day    Tramadol Hcl 50 Mg Tabs (Tramadol hcl) .[name]... 1 by mouth q 6hrs as needed pain  The left hip has mod DJD would try supplemnts keep up strength use tramadol for rescue pain  RT hip already has THR this seems to function well  Complete Medication List: 1)  Lovaza 1 Gm Caps (Omega-3-acid ethyl esters) .... Take 2 by mouth two times a day 2)  Vitamin D3 1000 Unit Tabs (Cholecalciferol) .[name].. 1 qd 3)  Aspirin 81 Mg Tbec (Aspirin) .... One by mouth every day 4)  Triamcinolone Acetonide 0.5 % Crea (Triamcinolone acetonide) .... Use two times a day prn 5)  Lorazepam 0.5 Mg Tabs (Lorazepam) .[name].. 1 by mouth two times a day as needed anxiety 6)  Effexor Xr 37.5 Mg Xr24h-cap (Venlafaxine hcl) .[name].. 1 once daily 7)  Adderall 10 Mg Tabs (Amphetamine-dextroamphetamine) .[name].. 1 up to four times a day 8)  Calcium 1500 Mg Tabs (Calcium carbonate) .[name].. 1 by mouth once daily 9)  Pennsaid 1.5 % Soln (Diclofenac sodium) .... 3-5 gtt on skin three times a day for pain 10)  Tramadol Hcl 50 Mg Tabs (Tramadol hcl) .Marland KitchenMarland KitchenMarland Kitchen 1  by mouth q 6hrs as needed pain  Patient Instructions: 1)  I would suggest trying a supplement 2)  Glucosamine 750/ chondroitin 600 - take 1 by mouth two times a day 3)  Good if this has some MSM (don't worry about amount) 4)  Fish oil supplements - these might be helpful 5)  1200 mgm of Omega 3 twice daily - in most capsule this is 2 capsules two times a day 6)  give supplementts at least a 3 month trial 7)  if you are traveling and you need something for pain - I would suggest considering tramadol Prescriptions: TRAMADOL HCL 50 MG TABS (TRAMADOL HCL) 1 by mouth q 6hrs as needed pain  #50 x 2   Entered and Authorized by:   Enid Baas MD   Signed by:   Enid Baas MD on 06/20/2010   Method used:   Electronically to        [facility] (retail)       [address]       [place], [place]  [address]       Ph: [phone] or [phone]       Fax: [phone]   RxID:   [id number]    Orders Added: 1)  New Patient Level III [phone]

## 2010-06-28 ENCOUNTER — Telehealth: Payer: Self-pay | Admitting: Internal Medicine

## 2010-07-04 NOTE — Progress Notes (Signed)
Summary: Diazepam Rf  Phone Note From Pharmacy   Caller: [facility] Summary of Call: rec Rf req for Diazepam 5 mg tabs. 1 by mouth two times a day  #60.  this med was removed from list...ok to Rf??? Initial call taken by: Lanier Prude, [facility]),  June 28, 2010 4:28 PM  Follow-up for Phone Call        ok x 2 ref Follow-up by: Tresa Garter MD,  June 28, 2010 5:52 PM    New/Updated Medications: VALIUM 5 MG TABS (DIAZEPAM) 1 two times a day Prescriptions: VALIUM 5 MG TABS (DIAZEPAM) 1 two times a day  #60 x 2   Entered by:   Lamar Sprinkles, CMA   Authorized by:   Tresa Garter MD   Signed by:   Lamar Sprinkles, CMA on 06/28/2010   Method used:   Telephoned to ...       [facility] (retail)       [address]       [place], [place]  [address]       Ph: [phone] or [phone]       Fax: [phone]   RxID:   [id number]

## 2010-07-26 ENCOUNTER — Telehealth: Payer: Self-pay | Admitting: *Deleted

## 2010-07-26 NOTE — Telephone Encounter (Signed)
Patient informed. 

## 2010-07-26 NOTE — Telephone Encounter (Signed)
1-2 tabs of Tramadol a day should be safe

## 2010-07-26 NOTE — Telephone Encounter (Signed)
Patient requesting advisement from MD - She was given tramadol to take prn hip pain. She also is on effexor and read that there may be an interaction? Is it ok for pt to take both meds?

## 2010-09-09 ENCOUNTER — Ambulatory Visit (INDEPENDENT_AMBULATORY_CARE_PROVIDER_SITE_OTHER): Payer: BC Managed Care – PPO | Admitting: Sports Medicine

## 2010-09-09 DIAGNOSIS — M1610 Unilateral primary osteoarthritis, unspecified hip: Secondary | ICD-10-CM

## 2010-09-09 DIAGNOSIS — M169 Osteoarthritis of hip, unspecified: Secondary | ICD-10-CM

## 2010-09-09 NOTE — Progress Notes (Signed)
  Subjective:    Patient ID: Courtney [name], female    DOB: Aug 05, 1950, 60 y.o.   MRN: [id number]  HPI  Pt presents to clinic for f/u of lt hip pain which she reports is 30% improved.  Still has intermittent pain.  Using supplements to help with arthritis - omega 3, glucosamine-chondroitin.  Uses tramadol occasionally for severe pain. Has most severe pain after being full hip flexion at kneeling position.  Has been for rolfing this morning before appointment which she states has helped hip pain.  Does have pain radiating down side of lt leg when hip is aggrivated.   She does exercise regularly. Prolonged standing or walking causes her hip pain. She was able to hike with the only challenge being climbing up or down too  high of rocks that put pressure on the hip.   Review of Systems     Objective:   Physical Exam    leg lengths equal In lying position ER 70 degrees, IR 45 degrees on rt In lying position ER 60 degrees, IR 15 degrees on lt SI joints move freely FABER neg bilat Abduction on lt weak, rotation strong Hip flexion strength good bilat HS strength good on lt Abduction strength weak on rt    Assessment & Plan:

## 2010-09-09 NOTE — Patient Instructions (Addendum)
You have weakness in the gluteus medius muscle on your left side Do suggested hip exercises 1-2 times per day-abduction and adduction, standing hip rotation, and step up/down/lateral exercises  Follow up in 2 months

## 2010-09-09 NOTE — Assessment & Plan Note (Signed)
She was given a series of hip rehabilitation exercises to do today. She needs to begin these at a lower level to be sure they do not flare her arthritis pain. I encouraged her to stay active but not spend too much time standing or walking particularly without wearing excellent shoes. We could consider adding some orthotic support for her walking and hiking to see if it would lessen pain.  She should also use some of the tramadol if she needs it for pain as she tends to use very little medication.  I would like to recheck in 2-3 months to see if she's gained some strength in the muscles to help with abduction.

## 2010-09-13 NOTE — Discharge Summary (Signed)
[place]  Patient:    Courtney [name], Courtney [name]                 MRN: [id number] Adm. Date:  [id number] Disc. Date: [id number] Attending:  Faith Rogue T Dictator:   Jamelle Rushing, P.A.C.                           Discharge Summary  ADMITTING DIAGNOSES: 1. End-stage osteoarthritis right hip. 2. Hypoglycemia. 3. Depression.  DISCHARGE DIAGNOSES: 1. Right total hip arthroplasty. 2. Postoperative blood loss anemia. 3. Depressive disorder. 4. Migraines.  HISTORY OF PRESENT ILLNESS:  This is a 60 year old white female with a long-term history of right hip pain.  The patient had a right hip pain with previous childbirth of children but over the years the pain just came and went.  Recently, the pain progressively worsened with increased frequency and intensity over the last four years.  The pain has been intolerable over the last three years with the use of acupuncture but that is no longer working. The pain is located primarily in the buttock and around to the lateral hip and then down the thigh to the ankle.  The pain is described as constant 24 hours of day, increases with activity.  It is an ache in quality.  She does have a sensation the leg will give out.  She does not have mechanical symptoms.  ALLERGIES:  BIAXIN and CODEINE.  MEDICATIONS: 1. CombiPatch 50/140 and estradiol every week.  The patient discontinued these    prior to admission due to rash. 2. Zoloft 50 mg p.o. q.d. 3. Celebrex 200 mg p.o. q.d. 4. Ultracet 1-2 tablets p.o. q.h.s.  PAST SURGICAL HISTORY:  On June 08, 2000, the patient was taken to the operating room by Dr. Georgena Spurling.  Under general anesthesia, the patient underwent a right total hip arthroplasty.  The patient tolerated the procedure well.  There were no complications.  No drains left in place.  The patient was transferred to the recovery room in good condition.  CONSULTANTS:  On June 08, 2000, the  patient had the following routine consults:  Physical therapy, occupational therapy, rehabilitation, and care management.  The patient also had pharmacy dose Lovenox for DVT prophylaxis.  HOSPITAL COURSE:  On June 08, 2000, the patient was admitted to [facility] under the care of Dr. Georgena Spurling.  The patient was taken to the operating room where a right total hip arthroplasty was performed.  The patient tolerated this procedure well and was transferred to the recovery room and then to the orthopedic floor in good condition.  The patient then incurred a four-day postoperative course in which her major complaint was a migraine pretty much every day postoperatively.  It did improve with the use of Imitrex.  She did not have any auras related with it and she has not had significant problems with this in the past being that she has had only one migraine prior to this admission.  The patient hemoglobin and hematocrit did drop to 8.4 over 24.1 but the patient remained asymptomatic.  So, she was not transfused any blood.  The patient worked well with physical therapy, but it was felt due to her home situation and need to be significantly independent prior to discharge that rehabilitation service would be the best option prior to being discharged to home.  The patients pain was managed fairly well initially with a Dilaudid  PCA and then she was transferred over to Oxycontin CR for p.o. pain management.  The patients right hip wound remained benign.  She had remained without any signs of Homans sign and the right lower extremity remained neuromotor vascularly intact.  On postoperative day #4, a rehabilitation bed became available.  So, she was transferred to rehabilitation services in good condition.  LABORATORY DATA:  EKG on admission was normal sinus rhythm at 66 beats per minute.  A CBC on June 11, 2000 showed WBC 8.3, hemoglobin 8.4, hematocrit 24.1,  platelets 234,000.  Routine chemistries on June 10, 2000 showed a sodium of 139, potassium of 3.6, glucose 113, BUN 4, creatinine 0.7.  Routine urinalysis on admission was totally normal.  DISCHARGE MEDICATIONS:  1. Tylenol 2 tablets p.o. q.4-6h. p.r.n. temperature.  2. Benadryl 50 mg p.o. q.6-8h. p.r.n. nausea.  3. Colace 100 mg p.o. b.i.d.  4. Lovenox 30 mg subcutaneously q.12h.  5. Ferrous sulfate 325 mg p.o. b.i.d.  6. Oxycodone 10 mg p.o. q.12h.  7. Percocet 1-2 tablets p.o. q.4-6h. p.r.n. breakthrough pain.  8. Zoloft 50 mg p.o. q.d.  9. Imitrex 25 mg p.o. p.r.n. migraine. 10. Restoril 15 mg p.o. q.h.s. p.r.n.  DISCHARGE INSTRUCTIONS:  The patient is to continue medications being administered on discharge from orthopedic floor.  ACTIVITY:  The patient may continue to be weightbearing as tolerated with the use of a walker.  DIET:  The patient is to continue routine diet.  WOUND CARE:  The patient is to have staples out on about postoperative day 10 through 14.  If she is in the hospital, they are to be removed.  If not, she is to have follow-up appointment with Dr. Georgena Spurling on or about that date. The patient is to keep wound clean and dry.  Check daily for infection.  FOLLOW-UP:  The patient is to have a follow-up appointment with Dr. Georgena Spurling on or about postoperative day #10 through #14 if staples have not been removed or two weeks after discharge from rehabilitation if staples have been removed during hospitalization.  CONDITION ON DISCHARGE:  The patients condition on discharge to rehabilitation services is listed as good. DD:  07/14/00 TD:  07/14/00 Job: [id number]

## 2010-09-13 NOTE — Assessment & Plan Note (Signed)
[facility]                             PRIMARY CARE OFFICE NOTE   NAME:Courtney [name], Courtney [name]               MRN:          [id number]  DATE:11/05/2005                            DOB:          10/22/1950    The patient is a 60 year old female who presents for a wellness examination.   PAST MEDICAL HISTORY:  Celiac disease, depression, right total hip  replacement 5 years ago, left carpal tunnel surgery by Dr. Amanda Pea in April  2007.   FAMILY HISTORY:  Her mother has a history of rheumatic fever, sister with  coronary artery disease, mother with coronary artery disease, father died  with Creutzfeldt-Jacob disease.   SOCIAL HISTORY:  She is an ex-smoker.  She continues to work.  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  No alcohol.   REVIEW OF SYSTEMS:  Doing fairly well staying on gluten-free diet.  A little  problems with pain in the hip.  Has problems with left wrist.   CURRENT MEDICATIONS:  1.  Celebrex 200 mg daily.  2.  Robinul 1 mg t.i.d. p.r.n.  3.  Fluoxetine 10 mg 2 once a day.  4.  Provigil 200 once a day.  5.  Femhrt.  6.  Nexium 40 mg p.r.n.   PHYSICAL EXAMINATION:  VITAL SIGNS:  Blood pressure 105/63, pulse 80,  temperature 98.3, weight 136 pounds.  GENERAL:  She looks well.  HEENT:  With moist mucosa.  NECK:  Supple, no thyromegaly or bruits.  LUNGS:  Clear.  No wheezes or rales.  HEART:  S1, S2, no murmur, no gallop.  ABDOMEN:  Soft, nontender, no organomegaly, mass.  EXTREMITIES:  Lower extremities without edema.  NEUROLOGIC:  She is alert, oriented, and cooperative.  Both major trochanter  areas are tender to palpation, straight leg elevation is negative.  Upon  standing, I do not appreciate any discrepancy in lower extremity length.   LABORATORY DATA:  On October 15, 2005, hemoglobin 16.1, CBC otherwise normal.  Cholesterol 215, LDL 136, TSH 1.94.  Urinalysis normal.   ASSESSMENT/PLAN:  1.  Normal wellness examination.  Age/health related issues discussed.      Healthy lifestyle discussed.  Continue regular GYN exam with Dr.      Thomasena Edis.  Mammogram every year, bone density scan every 2 to 3 years.      Continue with calcium and vitamin D.  Obtain chest x-ray.  Hemoccult      cards provided.  I will see her back in 3 months.  2.  Bilateral hip pain, more on the right.  She will follow up with Dr.      Sherlean Foot, previous injections have helped.  I gave her Ultram 50 mg 1 to 2      q.i.d. p.r.n.  She will start to wear comfortable shoes.  3.  Gastroesophageal reflux disease, minimal symptoms, Nexium p.r.n. only.  4.  Carpal tunnel syndrome, left, status post surgery doing fairly well.  5.  Celiac disease.  She is on gluten-free diet.  6.  Strong family history of coronary artery disease with  mild dyslipidemia.      I discussed with the patient I would like to get a CT cardiac scoring;      however, it is quite expensive now at [facility].  I asked      her to stop Celebrex and take aspirin 325 mg, enteric coated daily for      arthritis/coronary artery disease prevention.                                   Sonda Primes, MD   AP/MedQ  DD:  11/05/2005  DT:  11/05/2005  Job #:  [id number]   cc:   Artist Pais, MD  Mila Homer. Sherlean Foot, MD

## 2010-09-13 NOTE — Op Note (Signed)
[place]  Patient:    Courtney [name], Courtney [name]                        MRN: [id number] Proc. Date: 06/08/00 Adm. Date:  [id number] Disc. Date: [id number] Attending:  Thyra Breed                           Operative Report  PREOPERATIVE DIAGNOSIS:  Right hip osteoarthritis.  POSTOPERATIVE DIAGNOSIS:  Right hip osteoarthritis.  OPERATION PERFORMED:  Right total hip arthroplasty.  SURGEON:  Georgena Spurling, M.D.  ASSISTANT:  Jamelle Rushing, P.A.  ANESTHESIA:  General endotracheal.  INDICATIONS FOR PROCEDURE:  The patient is a 60 year old white female with failure of conservative treatment for right hip osteoarthritis.  After informed consent was obtained, she was taken to the operating room.  DESCRIPTION OF PROCEDURE:  The patient was laid supine and administered general endotracheal anesthesia and a Foley catheter was placed.  She was then placed in a left down, right up lateral decubitus position with all bony prominences well padded in the left lower extremity.  Hip positioner was in place.  The right hip and lower extremity were prepped and draped in the usual sterile fashion.  A straight incision was made centered over the trochanter approximately 20 cm in length.  Sharp dissection was continued down to the level of the fascia.  Bleeding vessels were cauterized with the Bovie.  We then incised the fascia lata along the length of the incision and placed a Charnley retractor in place.  We then performed a subtotal bursectomy of the trochanteric bursa and then identified the abductors.  We then removed the anterior one third of the gluteus medius and the gluteus minimus tendon tagging them with stay sutures.  We then held them, retracted them with a Hibbs retractor and externally rotated the leg and opened the anterior capsule, hip joint capsule.  We then did an anterior hip capsulectomy.  We then dislocated the hip with external rotation and adduction  and placed it in a pouch.  We then used a template to make our template out mark our neck cut. We then used to protect the abductors and the acetabulum with cobra retractors and used a sagittal saw to make our neck cut.  At this point we then brought the leg back up onto the table.  And placed one Hohmann anterior and one posterior, removed the labrum and the deep soft tissues.  We also released the transverse acetabular ligament, so that the femur could come away from the pelvis and we could have good access to the acetabulum.  I then switched sides of the table and began reaming with 40 and reamed up to 48 mm progressively. There was a large cyst superiorly and we packed some or our reamed bone into the cyst after curetting it out.  We then press-fitted in a size 50  no-hole, no spike cup in approximately 30 degrees of abduction and 15 to 20 degrees of anteversion.  We tamped this into place.  It rocked the pelvis and had good stability.  We then placed a 7 mm offset trial liner in place and turned our attention back to the femur.  We then placed the leg in the pouch off the table with external rotation and adduction and used a canal finder to find our femoral canal.  We then used our side biter reamer to ream both  posteriorly and laterally.  We then sequentially reamed up to 11.5 mm.  I then dropped the 12 mm down with almost 2 cm to spare distally.  We then broached from 10 up to 12.  We left the 12 broach in place and trialed with a standard neck and a 0 and 3.5 ball and felt that the 3.5 ball was very stable with equal leg lengths.  We then removed the trial components and irrigated copiously with the pulse lavage system.  At this point we placed the real size 12 mm stem in and tamped it into place.  We had difficulty seating it all the way down and after several heads, I decided to retrial with it approximately 2 mm more proud than our trialing level.  We used a 0 head and felt that  this offered the same excellent stability.  At this point we placed a 7 mm true polyethylene crossfire polyethylene and tamped it into place in the acetabulum.  Then we placed a 0 head in ____________ clean Lattie Corns taper on the femur and located the hip.  We had excellent stability and leg length.  We then placed the leg into some internal rotation, closed our adductors and gluteus minimus through drill holes.  We then placed several figure-of-eight of intertendinous sutures in the gluteus medius repairing it to the trochanter.  We then removed the Charnley retractor and closed the fascia with interrupted figure-of-eight #1 Vicryl sutures.  The deep soft tissues with interrupted 0 Vicryl sutures and then a subcuticular 2-0 Vicryl suture and skin staples.  We dressed the wound with Adaptic, 4 x 4s, ABDs and Ioban.  We then laid the patient supine, placed her in an abduction pillow and extubated her.  COMPLICATIONS:  None.  DRAINS:  None. DD:  06/08/00 TD:  06/08/00 Job: [id number]

## 2010-09-13 NOTE — Discharge Summary (Signed)
[place]. [facility]  Patient:    Courtney [name], Courtney [name]                 MRN: [id number] Adm. Date:  [id number] Disc. Date: [id number] Attending:  Faith Rogue T Dictator:   Junie Bame, P.A. CC:         Georgena Spurling, M.D.  Sonda Primes, M.D. [facility] [phone])   Discharge Summary  DISCHARGE DIAGNOSES: 1. 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 2. Depression. 3. Anemia.  HISTORY OF PRESENT ILLNESS:  The patient is a 60 year old white female who was admitted on June 08, 2000, for right total hip arthroplasty due to a 16-year history of intermittent progressing hip pain.  X-ray revealed end-stage osteoarthritis.  Surgeon was Dr. Darcella Cheshire.  The patient was placed on Lovenox 30 mg subcu for DVT prophylaxis.  No major complication occurred postoperatively.  PT report:  The patient was ambulating with minimum assist as needed with standard walker, WBAT.  PRIMARY CARE PHYSICIAN:  Dr. Trinna Post Plotnikov.  ALLERGIES:  CODEINE and BIAXIN.  MEDICATIONS PRIOR TO ADMISSION: 1. Zoloft 50 mg q.h.s. 2. Celebrex 200 mg q.d. 3. CombiPatch 50/140.  PAST MEDICAL HISTORY:  Significant for hypothyroidism, OA, depression.  PAST SURGICAL HISTORY:  Significant for a tonsillectomy, endometriosis, and cyst removal.  FAMILY MEDICAL HISTORY:  Mother deceased from CAD, and father deceased from Creutzfeldt-Jakob disease.  SOCIAL HISTORY:  The patient is married and lives with husband in a two-story home.  She has two adult children.  She is employed as an Wellsite geologist.  The patient was totally independent prior to admission.  Positive for alcohol use socially.  Denies tobacco usage.  HOSPITAL COURSE:  Courtney [name] was admitted to rehab center on June 12, 2000, for comprehensive inpatient rehabilitation and received more than three hours of PT/OT daily.  The patient did quite well in rehab during her six-day stay.  The only  complication she endured was a decreased hemoglobin of 8.0 and constipation.  She received 1-1/2 units packed red blood cells on June 16, 2000, sorbitol for bowels, and Chromagen Forte q.d.  The patient complained often of pain; therefore, medications were adjusted accordingly.  Vitals remained stable throughout rehab stay.  At time of discharge, Courtney [name] vitals were stable.  Pain was controlled with current meds.  Latest hemoglobin reading was 10.6.  The patient was ambulating independently with walker and could perform all ADLs.  Surgical incision demonstrated no signs of infection, and the patient was discharged to home with husband.  DISCHARGE MEDICATIONS: 1. OxyContin 10 mg two tablets q.12h. for two to three days then taper to    OxyContin 10 mg one tab _______ for three to four days. 2. Oxycodone 5 mg one tablet q.4-6h. p.r.n. 3. Chromagen Forte one tablet p.o. b.i.d. 4. CombiPatch one patch twice weekly x 28 days. 5. Zoloft 40 mg one tablet q.d. 6. Lovenox 30 mg injections q.12h. x 7 days. 7. Sorbitol p.r.n. over-the-counter for constipation.  DISCHARGE INSTRUCTIONS:  Activity:  The patient was to observe hip precaution and use walker.  Diet:  Regular.  Wound care:  Keep area clean and dry.  SPECIAL DISCHARGE INSTRUCTIONS:  The patient is going to receive home health for PT and OT and to have her staples remove.  DISCHARGE FOLLOWUP:  She is to follow up with Dr. Darcella Cheshire within two weeks, Dr. Posey Rea within four weeks, and Dr. Myra Gianotti as needed. DD:  06/22/00 TD:  06/23/00 Job: [id number]  [id number]

## 2010-09-13 NOTE — H&P (Signed)
[place]. [facility]  Patient:    Courtney [name], Courtney [name]                 MRN: [id number] Adm. Date:  06/08/00 Attending:  Georgena Spurling, M.D. Dictator:   Jamelle Rushing, P.A.-C.                         History and Physical  DATE OF BIRTH:  November 15, 1915  CHIEF COMPLAINT:  Right hip pain off and on for the past 16 years or so.  HISTORY OF PRESENT ILLNESS:  The patient is a 60 year old white female who states that her right hip pain started about 16 years ago with a pregnancy with her first daughter.  The pain incurred throughout her entire pregnancy. It did gradually resolve after the pregnancy, but then did return 12 years ago, with the pregnancy of her son.  After that the patient has had off and on pain with ambulating, and a sensation like her hip would go out on her.  The patient has had problems with stairs with the sensation that her hip would go in and out.  This continued up until about four years ago, when it became a chronic problem, and has gotten significantly worse over the last three years. The patient has tried acupuncture, and that has improved her symptoms up until about the last year, when it has become unbearable.  The pain has increased significantly since last March.  She states it occurs 24 hours a day, seven days a week.  It is located from her lower back throughout her buttocks, around to the lateral aspect of her thigh, and then down around her thigh, down all the way to her ankle.  She has very little pain in her groin region. She describes it as a constant ache which does significantly worsen with activities that she does.  She does have a sensation that her hip feels like it is coming in and out of the joint, but she does not have any grinding.  ALLERGIES:  BIAXIN AND CODEINE.  CURRENT MEDICATIONS: 1. Combipatch 50/140.. 2. Estradiol/norethindrone acetate transdermal patch. 3. Zoloft 50 mg q.d. 4. Celebrex 200 mg q.d.  PAST  MEDICAL HISTORY: 1. The patient has had hypothyroidism in the 1930s, but has not had    problems recently. 2. She states that she does get hypoglycemic fairly easily. 3. She is also being treated for depression.  The patient otherwise denies diabetes mellitus, hypertension, hiatal hernia, peptic ulcer disease, heart disease, or asthma.  PAST SURGICAL HISTORY: 1. Tonsillectomy in 1955. 2. Endometriosis cyst removed in 1969. 3. Laparoscopic abdominal exploration in 1979. 4. Hernia removed in 1994.  The patient denies any complications with the above-mentioned surgical procedures.  SOCIAL HISTORY:  The patient is a 60 year old white female.  She denies any smoking.  She does admit to alcohol, one or two drinks a week socially.  She is married.  She has two grown children.  She lives in a two-story house.  She is employed as an Regulatory affairs officer.  FAMILY PHYSICIAN:  Dr. Trinna Post Plotnikov at [phone].  FAMILY HISTORY:  Mother is deceased at age 18 from heart disease.  Father is deceased at age 80 from Creutzfeldt-Jakob disease.  The patient does have one sister who is alive and well, with no significant medical problems.  REVIEW OF SYSTEMS:  The patient has been recently treated with an antibiotic of Levaquin for a  sinus infection on a 10-day course, which she just finished.  The patient denies any continued symptoms.  The patient does wear braces. She does have a problem with constipation since being on the Celebrex. Otherwise the review of systems is negative and noncontributory for any other general, respiratory, sensory, GI, GU, cardiac, hematological, musculoskeletal, neurological, or mental status problems at this time.  PHYSICAL EXAMINATION:  GENERAL:  Height 5 feet 2 inches, weight 140 pounds.  This is a healthy-appearing well-developed female.  She ambulates fairly easily without any significant limp.  She is able to get on and off of the examination  table without difficulty.  VITAL SIGNS:  Temperature 98.2 degrees, pulse 76, respirations 12, blood pressure 100/68.  HEENT:  Head normocephalic, atraumatic.  Nontender over the maxillary or frontal sinuses.  Pupils equal, round, reactive to light and accommodating to light.  Extraocular movements intact.  Sclerae anicteric.  Conjunctivae pink and moist.  External ears without deformities.  Canals patent.  Tympanic membranes pearly gray and intact.  Gross hearing intact.  Nasal septum was midline.  Mucous membranes pink and moist.  No polyps noted.  Oral buccal mucosa pink and moist, without lesions.  Dentition was in good repair with braces on.  Uvula midline and moved symmetrically with phonation.  NECK:  Supple, no palpable lymphadenopathy.  Thyroid gland was nontender. The patient had a good range of motion of her cervical spine without any difficulty or tenderness.  HEART:  A regular rate and rhythm.  S1 and S2 auscultated.  No murmurs, rubs, or gallops.  CHEST:  Lung sounds clear and equal bilaterally.  No wheezes, rales, or rhonchi, or rubs noted.  ABDOMEN:  Flat, nontender.  Bowel sounds were present throughout.  No hepatosplenomegaly.  CVA nontender to percussion.  The patient had some well-healed abdominal incisions.  EXTREMITIES:  Upper extremities were symmetrically sized and shaped.  He had an excellent range of motion of her shoulders, elbows, and wrists, without any difficulty.  She had 5/5 motor strength in all muscle groups.  Lower extremities:  Left hip had excellent range of motion with full extension and flexion to 130 degrees, and 20-30 degrees of internal and external rotation, with no tenderness or difficulty.  Right hip had full extension and flexion to 90 degrees.  She had 15 degrees of external rotation and about 5 degrees of internal rotation.  She had 20 degrees of abduction of the hip and 10 degrees adduction of the hip.  It was limited by mechanical  symptoms and pain. Bilateral knees were symmetrically sized and shaped.  She had no significant  medial or lateral joint line tenderness.  No sign of erythema or ecchymosis or effusions.  There was slight crepitus in the bilateral knees under the patella with range of motion, which was normal.  She had no significant valgus or varus laxity or anterior or posterior drawer.  Bilateral calves were nontender.  Bilateral ankles were symmetrical with good dorsiflexion and plantar flexion.  PERIPHERAL VASCULAR:  Carotid pulses 2+, no bruits.  Radial pulses 2+. Femoral pulses were 2+.  Dorsalis pedis pulses 1+, and posterior tibial pulses were 1+.  The patient had no lower extremity edema or venous stasis changes.  NEUROLOGIC:  The patient was conscious, alert, and appropriate.  Held an easy conversation with the examiner.  Cranial nerves II-XII were grossly intact. Deep tendon reflexes of the upper and lower extremities were symmetrical right to left.  The patient is grossly intact to light touch  sensation from head to toe.  BREASTS/RECTAL/GU:  Examinations were deferred at this time.  IMPRESSION: 1. End-stage osteoarthritis, right hip. 2. Hypoglycemia. 3. Depression.  PLAN:  The patient will be admitted to [facility] on June 08, 2000, under the care of Dr. Georgena Spurling.  The patient will undergo all routine labs and tests prior to having a right total hip arthroplasty performed. DD:  06/03/00 TD:  06/03/00 Job: [id number]

## 2010-10-07 ENCOUNTER — Ambulatory Visit: Payer: BC Managed Care – PPO | Admitting: Sports Medicine

## 2010-10-29 ENCOUNTER — Encounter: Payer: Self-pay | Admitting: *Deleted

## 2010-12-03 ENCOUNTER — Telehealth: Payer: Self-pay | Admitting: *Deleted

## 2010-12-03 DIAGNOSIS — E785 Hyperlipidemia, unspecified: Secondary | ICD-10-CM

## 2010-12-03 DIAGNOSIS — R5383 Other fatigue: Secondary | ICD-10-CM

## 2010-12-03 NOTE — Telephone Encounter (Signed)
Patient requesting lab orders. She has no upcoming apts. Please advise what, if any labs she needs.

## 2010-12-03 NOTE — Telephone Encounter (Signed)
Patient requesting lab orders. She has no upcoming apts. Please advise what, if any labs she needs.    

## 2010-12-04 ENCOUNTER — Ambulatory Visit: Payer: BC Managed Care – PPO | Admitting: Internal Medicine

## 2010-12-04 NOTE — Telephone Encounter (Signed)
Lipids, CMET, TSH, CBC  Dx dyslipidemia, hip pain Thx

## 2010-12-04 NOTE — Telephone Encounter (Signed)
Ok thx.

## 2010-12-04 NOTE — Telephone Encounter (Signed)
Pt also would like C-reactive protein & Vit D.

## 2010-12-05 NOTE — Telephone Encounter (Signed)
Orders added, pt already aware to come to lab

## 2010-12-23 ENCOUNTER — Telehealth: Payer: Self-pay | Admitting: *Deleted

## 2010-12-23 ENCOUNTER — Ambulatory Visit: Payer: BC Managed Care – PPO

## 2010-12-23 DIAGNOSIS — E785 Hyperlipidemia, unspecified: Secondary | ICD-10-CM

## 2010-12-23 DIAGNOSIS — M25559 Pain in unspecified hip: Secondary | ICD-10-CM

## 2010-12-23 DIAGNOSIS — Z0000 Encounter for general adult medical examination without abnormal findings: Secondary | ICD-10-CM

## 2010-12-23 DIAGNOSIS — R5383 Other fatigue: Secondary | ICD-10-CM

## 2010-12-23 DIAGNOSIS — R030 Elevated blood-pressure reading, without diagnosis of hypertension: Secondary | ICD-10-CM

## 2010-12-23 LAB — TSH: TSH: 1.05 u[IU]/mL (ref 0.35–5.50)

## 2010-12-23 LAB — COMPREHENSIVE METABOLIC PANEL
ALT: 17 U/L (ref 0–35)
AST: 22 U/L (ref 0–37)
Albumin: 4 g/dL (ref 3.5–5.2)
Alkaline Phosphatase: 40 U/L (ref 39–117)
BUN: 15 mg/dL (ref 6–23)
CO2: 31 mEq/L (ref 19–32)
Calcium: 9.1 mg/dL (ref 8.4–10.5)
Chloride: 101 mEq/L (ref 96–112)
Creatinine, Ser: 0.8 mg/dL (ref 0.4–1.2)
GFR: 79.51 mL/min (ref >60.00)
Glucose, Bld: 105 mg/dL — ABNORMAL HIGH (ref 70–99)
Potassium: 4.1 mEq/L (ref 3.5–5.1)
Sodium: 138 mEq/L (ref 135–145)
Total Bilirubin: 0.6 mg/dL (ref 0.3–1.2)
Total Protein: 6.7 g/dL (ref 6.0–8.3)

## 2010-12-23 LAB — CBC WITH DIFFERENTIAL/PLATELET
Basophils Absolute: 0 10*3/uL (ref 0.0–0.1)
Basophils Relative: 0.7 % (ref 0.0–3.0)
Eosinophils Absolute: 0.2 10*3/uL (ref 0.0–0.7)
Eosinophils Relative: 4.1 % (ref 0.0–5.0)
HCT: 39.8 % (ref 36.0–46.0)
Hemoglobin: 13.6 g/dL (ref 12.0–15.0)
Lymphocytes Relative: 32.7 % (ref 12.0–46.0)
Lymphs Abs: 1.8 10*3/uL (ref 0.7–4.0)
MCHC: 34.1 g/dL (ref 30.0–36.0)
MCV: 94.4 fl (ref 78.0–100.0)
Monocytes Absolute: 0.5 10*3/uL (ref 0.1–1.0)
Monocytes Relative: 8.4 % (ref 3.0–12.0)
Neutro Abs: 3 10*3/uL (ref 1.4–7.7)
Neutrophils Relative %: 54.1 % (ref 43.0–77.0)
Platelets: 261 10*3/uL (ref 150.0–400.0)
RBC: 4.22 Mil/uL (ref 3.87–5.11)
RDW: 12.9 % (ref 11.5–14.6)
WBC: 5.6 10*3/uL (ref 4.5–10.5)

## 2010-12-23 LAB — HIGH SENSITIVITY CRP: CRP, High Sensitivity: 0.48 mg/L (ref 0.000–5.000)

## 2010-12-23 LAB — LIPID PANEL
Cholesterol: 229 mg/dL — ABNORMAL HIGH (ref 0–200)
HDL: 104.2 mg/dL (ref >39.00)
Total CHOL/HDL Ratio: 2
Triglycerides: 42 mg/dL (ref 0.0–149.0)
VLDL: 8.4 mg/dL (ref 0.0–40.0)

## 2010-12-23 LAB — VITAMIN D 25 HYDROXY (VIT D DEFICIENCY, FRACTURES): Vit D, 25-Hydroxy: 56 ng/mL (ref 30–89)

## 2010-12-23 LAB — LDL CHOLESTEROL, DIRECT: Direct LDL: 110 mg/dL

## 2010-12-23 NOTE — Telephone Encounter (Signed)
Lab orders entered

## 2010-12-25 ENCOUNTER — Telehealth: Payer: Self-pay | Admitting: Internal Medicine

## 2010-12-25 ENCOUNTER — Ambulatory Visit (INDEPENDENT_AMBULATORY_CARE_PROVIDER_SITE_OTHER): Payer: BC Managed Care – PPO | Admitting: Internal Medicine

## 2010-12-25 ENCOUNTER — Encounter: Payer: Self-pay | Admitting: Internal Medicine

## 2010-12-25 VITALS — BP 120/80 | HR 80 | Temp 97.8°F | Resp 16 | Wt 123.0 lb

## 2010-12-25 DIAGNOSIS — H6590 Unspecified nonsuppurative otitis media, unspecified ear: Secondary | ICD-10-CM

## 2010-12-25 DIAGNOSIS — M25559 Pain in unspecified hip: Secondary | ICD-10-CM

## 2010-12-25 DIAGNOSIS — H6120 Impacted cerumen, unspecified ear: Secondary | ICD-10-CM | POA: Insufficient documentation

## 2010-12-25 DIAGNOSIS — E785 Hyperlipidemia, unspecified: Secondary | ICD-10-CM

## 2010-12-25 DIAGNOSIS — M810 Age-related osteoporosis without current pathological fracture: Secondary | ICD-10-CM

## 2010-12-25 MED ORDER — FLUTICASONE PROPIONATE 50 MCG/ACT NA SUSP: 2.0000 | Freq: Every day | NASAL | Status: DC

## 2010-12-25 MED ORDER — AMOXICILLIN 500 MG PO CAPS: 1000.0000 mg | ORAL_CAPSULE | Freq: Two times a day (BID) | ORAL | Status: AC

## 2010-12-25 NOTE — Assessment & Plan Note (Signed)
Amoxicillin if not better

## 2010-12-25 NOTE — Assessment & Plan Note (Signed)
Will irrigate 

## 2010-12-25 NOTE — Assessment & Plan Note (Signed)
Exercises, stretching

## 2010-12-25 NOTE — Assessment & Plan Note (Signed)
On fish oil 

## 2010-12-25 NOTE — Assessment & Plan Note (Signed)
On Vit D 

## 2010-12-25 NOTE — Telephone Encounter (Signed)
done

## 2010-12-25 NOTE — Progress Notes (Signed)
  Subjective:    Patient ID: Courtney [name], female    DOB: 08/17/1950, 60 y.o.   MRN: [id number]  HPI  The patient presents for a follow-up of  chronic hypertension, chronic dyslipidemia, OA controlled with medicines C/o L ear crackling at times    Review of Systems  Constitutional: Negative for chills, activity change, appetite change, fatigue and unexpected weight change.  HENT: Negative for congestion, mouth sores and sinus pressure.   Eyes: Negative for visual disturbance.  Respiratory: Negative for cough and chest tightness.   Gastrointestinal: Negative for nausea and abdominal pain.  Genitourinary: Negative for frequency, difficulty urinating and vaginal pain.  Musculoskeletal: Positive for back pain and arthralgias (SI L). Negative for gait problem.  Skin: Negative for pallor and rash.  Neurological: Negative for dizziness, tremors, weakness, numbness and headaches.  Psychiatric/Behavioral: Negative for confusion and sleep disturbance. The patient is not nervous/anxious.        Objective:   Physical Exam  Constitutional: She appears well-developed and well-nourished. No distress.  HENT:  Head: Normocephalic.  Right Ear: External ear normal.  Left Ear: External ear normal.  Nose: Nose normal.  Mouth/Throat: Oropharynx is clear and moist.  Eyes: Conjunctivae are normal. Pupils are equal, round, and reactive to light. Right eye exhibits no discharge. Left eye exhibits no discharge.  Neck: Normal range of motion. Neck supple. No JVD present. No tracheal deviation present. No thyromegaly present.  Cardiovascular: Normal rate, regular rhythm and normal heart sounds.   Pulmonary/Chest: No stridor. No respiratory distress. She has no wheezes.  Abdominal: Soft. Bowel sounds are normal. She exhibits no distension and no mass. There is no tenderness. There is no rebound and no guarding.  Musculoskeletal: She exhibits tenderness (L SI is tender). She exhibits no edema.    Lymphadenopathy:    She has no cervical adenopathy.  Neurological: She displays normal reflexes. No cranial nerve deficit. She exhibits normal muscle tone. Coordination normal.  Skin: No rash noted. No erythema.  Psychiatric: She has a normal mood and affect. Her behavior is normal. Judgment and thought content normal.   L ear wax   Procedure Note :     Procedure :  Ear irrigation L   Indication:  Cerumen impaction L   Risks, including pain, dizziness, eardrum perforation, bleeding, infection and others as well as benefits were explained to the patient in detail. Verbal consent was obtained and the patient agreed to proceed.    We used "The [employer] Device" field with lukewarm water for irrigation. A large amount wax was recovered. Procedure has also required manual wax removal with an ear loop.   Tolerated well. Complications: None.   Postprocedure instructions :  Call if problems.   Lab Results  Component Value Date   WBC 5.6 12/23/2010   HGB 13.6 12/23/2010   HCT 39.8 12/23/2010   PLT 261.0 12/23/2010   CHOL 229* 12/23/2010   TRIG 42.0 12/23/2010   HDL 104.20 12/23/2010   LDLDIRECT 110.0 12/23/2010   ALT 17 12/23/2010   AST 22 12/23/2010   NA 138 12/23/2010   K 4.1 12/23/2010   CL 101 12/23/2010   CREATININE 0.8 12/23/2010   BUN 15 12/23/2010   CO2 31 12/23/2010   TSH 1.05 12/23/2010         Assessment & Plan:

## 2010-12-25 NOTE — Telephone Encounter (Signed)
Please, mail the labs to the patient.     Thx 

## 2011-03-04 ENCOUNTER — Telehealth: Payer: Self-pay | Admitting: *Deleted

## 2011-03-04 NOTE — Telephone Encounter (Signed)
Pt calling requesting immunization records. I advised her no records in EMR. Will have to review paper chart. Harriett Sine to request paper chart.

## 2011-03-05 NOTE — Telephone Encounter (Signed)
I reviewed paper chart- I do not see any immunizations but tetanus in 1998. She is going to [place] in 1 month. She is checking the CDC website to see what vaccines she needs besides what we have in office. Please advise... Does she need pneumovax and/or shingles?

## 2011-03-05 NOTE — Telephone Encounter (Signed)
Flu shot, tDap Hep A/b Pneumovax Malaria prophylaxis meds  + CDC recomendaions  What is she going there for?

## 2011-03-06 NOTE — Telephone Encounter (Signed)
Pt informed

## 2011-03-06 NOTE — Telephone Encounter (Signed)
Left mess for patient to call back.  

## 2011-03-07 ENCOUNTER — Ambulatory Visit (INDEPENDENT_AMBULATORY_CARE_PROVIDER_SITE_OTHER): Payer: BC Managed Care – PPO | Admitting: *Deleted

## 2011-03-07 ENCOUNTER — Ambulatory Visit: Payer: BC Managed Care – PPO

## 2011-03-07 DIAGNOSIS — Z23 Encounter for immunization: Secondary | ICD-10-CM

## 2011-03-21 ENCOUNTER — Ambulatory Visit: Payer: BC Managed Care – PPO

## 2011-03-24 ENCOUNTER — Ambulatory Visit (INDEPENDENT_AMBULATORY_CARE_PROVIDER_SITE_OTHER): Payer: BC Managed Care – PPO | Admitting: Internal Medicine

## 2011-03-24 ENCOUNTER — Telehealth: Payer: Self-pay | Admitting: Internal Medicine

## 2011-03-24 DIAGNOSIS — Z7184 Encounter for health counseling related to travel: Secondary | ICD-10-CM

## 2011-03-24 DIAGNOSIS — Z7189 Other specified counseling: Secondary | ICD-10-CM

## 2011-03-24 DIAGNOSIS — Z23 Encounter for immunization: Secondary | ICD-10-CM

## 2011-03-24 MED ORDER — ATOVAQUONE-PROGUANIL HCL 250-100 MG PO TABS: 1.0000 | ORAL_TABLET | Freq: Every day | ORAL | Status: DC

## 2011-03-24 MED ORDER — PNEUMOCOCCAL VAC POLYVALENT 25 MCG/0.5ML IJ INJ: 0.5000 mL | INJECTION | INTRAMUSCULAR | Status: AC

## 2011-03-24 MED ORDER — CIPROFLOXACIN HCL 500 MG PO TABS: 500.0000 mg | ORAL_TABLET | Freq: Two times a day (BID) | ORAL | Status: AC

## 2011-03-24 MED ORDER — DIPHENOXYLATE-ATROPINE 2.5-0.025 MG PO TABS: 1.0000 | ORAL_TABLET | Freq: Four times a day (QID) | ORAL | Status: AC | PRN

## 2011-03-24 MED ORDER — PROMETHAZINE HCL 12.5 MG PO TABS: 12.5000 mg | ORAL_TABLET | Freq: Four times a day (QID) | ORAL | Status: AC | PRN

## 2011-03-24 NOTE — Telephone Encounter (Signed)
Cipro Malar Lomot Promethazine

## 2011-03-26 ENCOUNTER — Encounter: Payer: Self-pay | Admitting: Internal Medicine

## 2011-03-26 DIAGNOSIS — Z7184 Encounter for health counseling related to travel: Secondary | ICD-10-CM | POA: Insufficient documentation

## 2011-03-26 NOTE — Assessment & Plan Note (Signed)
See vaccines; travel discussed Cipro Lomotil Malarone

## 2011-03-26 NOTE — Progress Notes (Signed)
  Subjective:    Patient ID: Courtney [name], female    DOB: 05-19-1950, 60 y.o.   MRN: [id number]  HPI She was worked in to discuss malaria prophylaxis, abx, diarrhea meds and vaccines for her [place] trip.   Review of Systems  Constitutional: Negative for fever and chills.  Respiratory: Negative for cough.   Cardiovascular: Negative for chest pain.  Musculoskeletal: Negative for back pain.  Psychiatric/Behavioral: The patient is not nervous/anxious.        Objective:   Physical Exam  Constitutional: She appears well-developed. No distress.  Psychiatric: She has a normal mood and affect. Judgment normal.          Assessment & Plan:

## 2011-04-04 ENCOUNTER — Ambulatory Visit (INDEPENDENT_AMBULATORY_CARE_PROVIDER_SITE_OTHER): Payer: BC Managed Care – PPO | Admitting: *Deleted

## 2011-04-04 DIAGNOSIS — Z23 Encounter for immunization: Secondary | ICD-10-CM

## 2011-04-10 ENCOUNTER — Telehealth: Payer: Self-pay | Admitting: *Deleted

## 2011-04-10 NOTE — Telephone Encounter (Signed)
If no booster was given since 1980 - we can give a booster of MMR 0.5 ml Thx

## 2011-04-10 NOTE — Telephone Encounter (Signed)
Pt wants to know if she needs a measles booster. Please advise. When/where should she get this?

## 2011-04-11 NOTE — Telephone Encounter (Signed)
Pt informed

## 2011-04-15 ENCOUNTER — Ambulatory Visit (INDEPENDENT_AMBULATORY_CARE_PROVIDER_SITE_OTHER): Payer: BC Managed Care – PPO | Admitting: *Deleted

## 2011-04-15 DIAGNOSIS — Z23 Encounter for immunization: Secondary | ICD-10-CM

## 2011-06-03 ENCOUNTER — Other Ambulatory Visit: Payer: Self-pay | Admitting: Orthopedic Surgery

## 2011-06-12 ENCOUNTER — Telehealth: Payer: Self-pay | Admitting: *Deleted

## 2011-06-12 NOTE — Telephone Encounter (Signed)
Faxed surgical clearance form to above for pt to have Left hip:THA

## 2011-07-07 ENCOUNTER — Other Ambulatory Visit: Payer: Self-pay | Admitting: Dermatology

## 2011-09-23 ENCOUNTER — Encounter: Payer: BC Managed Care – PPO | Admitting: Internal Medicine

## 2011-09-24 ENCOUNTER — Encounter (HOSPITAL_COMMUNITY): Admission: RE | Payer: Self-pay | Source: Ambulatory Visit

## 2011-09-24 ENCOUNTER — Ambulatory Visit (HOSPITAL_COMMUNITY)
Admission: RE | Admit: 2011-09-24 | Payer: Federal, State, Local not specified - PPO | Source: Ambulatory Visit | Admitting: Orthopedic Surgery

## 2011-09-24 SURGERY — ARTHROPLASTY, HIP, TOTAL,POSTERIOR APPROACH
Anesthesia: Choice | Site: Hip | Laterality: Left

## 2011-10-02 ENCOUNTER — Other Ambulatory Visit (INDEPENDENT_AMBULATORY_CARE_PROVIDER_SITE_OTHER): Payer: Federal, State, Local not specified - PPO

## 2011-10-02 ENCOUNTER — Encounter: Payer: Self-pay | Admitting: Internal Medicine

## 2011-10-02 ENCOUNTER — Ambulatory Visit (INDEPENDENT_AMBULATORY_CARE_PROVIDER_SITE_OTHER): Payer: Federal, State, Local not specified - PPO | Admitting: Internal Medicine

## 2011-10-02 VITALS — BP 100/62 | HR 84 | Temp 97.7°F | Resp 16 | Wt 121.0 lb

## 2011-10-02 DIAGNOSIS — R209 Unspecified disturbances of skin sensation: Secondary | ICD-10-CM

## 2011-10-02 DIAGNOSIS — Z0000 Encounter for general adult medical examination without abnormal findings: Secondary | ICD-10-CM

## 2011-10-02 DIAGNOSIS — Z23 Encounter for immunization: Secondary | ICD-10-CM

## 2011-10-02 DIAGNOSIS — M25559 Pain in unspecified hip: Secondary | ICD-10-CM

## 2011-10-02 DIAGNOSIS — R634 Abnormal weight loss: Secondary | ICD-10-CM

## 2011-10-02 DIAGNOSIS — E785 Hyperlipidemia, unspecified: Secondary | ICD-10-CM

## 2011-10-02 DIAGNOSIS — R202 Paresthesia of skin: Secondary | ICD-10-CM

## 2011-10-02 DIAGNOSIS — F411 Generalized anxiety disorder: Secondary | ICD-10-CM

## 2011-10-02 LAB — URINALYSIS
Bilirubin Urine: NEGATIVE
Hgb urine dipstick: NEGATIVE
Ketones, ur: NEGATIVE
Leukocytes, UA: NEGATIVE
Nitrite: NEGATIVE
Specific Gravity, Urine: 1.01 (ref 1.000–1.030)
Total Protein, Urine: NEGATIVE
Urine Glucose: NEGATIVE
Urobilinogen, UA: 0.2 (ref 0.0–1.0)
pH: 7 (ref 5.0–8.0)

## 2011-10-02 LAB — TSH: TSH: 1.81 u[IU]/mL (ref 0.35–5.50)

## 2011-10-02 LAB — BASIC METABOLIC PANEL
BUN: 10 mg/dL (ref 6–23)
CO2: 30 mEq/L (ref 19–32)
Calcium: 9.3 mg/dL (ref 8.4–10.5)
Chloride: 103 mEq/L (ref 96–112)
Creatinine, Ser: 0.7 mg/dL (ref 0.4–1.2)
GFR: 97.89 mL/min (ref >60.00)
Glucose, Bld: 86 mg/dL (ref 70–99)
Potassium: 4.4 mEq/L (ref 3.5–5.1)
Sodium: 139 mEq/L (ref 135–145)

## 2011-10-02 LAB — CBC WITH DIFFERENTIAL/PLATELET
Basophils Absolute: 0 10*3/uL (ref 0.0–0.1)
Basophils Relative: 0.6 % (ref 0.0–3.0)
Eosinophils Absolute: 0.4 10*3/uL (ref 0.0–0.7)
Eosinophils Relative: 7.3 % — ABNORMAL HIGH (ref 0.0–5.0)
HCT: 43.1 % (ref 36.0–46.0)
Hemoglobin: 14.5 g/dL (ref 12.0–15.0)
Lymphocytes Relative: 28.8 % (ref 12.0–46.0)
Lymphs Abs: 1.7 10*3/uL (ref 0.7–4.0)
MCHC: 33.5 g/dL (ref 30.0–36.0)
MCV: 94.9 fl (ref 78.0–100.0)
Monocytes Absolute: 0.5 10*3/uL (ref 0.1–1.0)
Monocytes Relative: 8.4 % (ref 3.0–12.0)
Neutro Abs: 3.3 10*3/uL (ref 1.4–7.7)
Neutrophils Relative %: 54.9 % (ref 43.0–77.0)
Platelets: 334 10*3/uL (ref 150.0–400.0)
RBC: 4.54 Mil/uL (ref 3.87–5.11)
RDW: 13.2 % (ref 11.5–14.6)
WBC: 6 10*3/uL (ref 4.5–10.5)

## 2011-10-02 LAB — LIPID PANEL
Cholesterol: 234 mg/dL — ABNORMAL HIGH (ref 0–200)
HDL: 105.1 mg/dL (ref >39.00)
Total CHOL/HDL Ratio: 2
Triglycerides: 66 mg/dL (ref 0.0–149.0)
VLDL: 13.2 mg/dL (ref 0.0–40.0)

## 2011-10-02 LAB — HEMOGLOBIN A1C: Hgb A1c MFr Bld: 5.6 % (ref 4.6–6.5)

## 2011-10-02 LAB — HEPATIC FUNCTION PANEL
ALT: 14 U/L (ref 0–35)
AST: 19 U/L (ref 0–37)
Albumin: 4.2 g/dL (ref 3.5–5.2)
Alkaline Phosphatase: 50 U/L (ref 39–117)
Bilirubin, Direct: 0.1 mg/dL (ref 0.0–0.3)
Total Bilirubin: 0.6 mg/dL (ref 0.3–1.2)
Total Protein: 7 g/dL (ref 6.0–8.3)

## 2011-10-02 LAB — LDL CHOLESTEROL, DIRECT: Direct LDL: 123.9 mg/dL

## 2011-10-02 LAB — SEDIMENTATION RATE: Sed Rate: 17 mm/hr (ref 0–22)

## 2011-10-02 LAB — RHEUMATOID FACTOR: Rhuematoid fact SerPl-aCnc: 27 IU/mL — ABNORMAL HIGH (ref <=14)

## 2011-10-02 LAB — VITAMIN B12: Vitamin B-12: 554 pg/mL (ref 211–911)

## 2011-10-02 MED ORDER — HYDROCODONE-ACETAMINOPHEN 5-325 MG PO TABS: 1.0000 | ORAL_TABLET | Freq: Two times a day (BID) | ORAL | Status: AC | PRN

## 2011-10-02 MED ORDER — PREGABALIN 75 MG PO CAPS: 75.0000 mg | ORAL_CAPSULE | Freq: Three times a day (TID) | ORAL | Status: DC | PRN

## 2011-10-02 MED ORDER — METHOCARBAMOL 500 MG PO TABS: 500.0000 mg | ORAL_TABLET | Freq: Two times a day (BID) | ORAL | Status: AC | PRN

## 2011-10-02 NOTE — Progress Notes (Signed)
Patient ID: Courtney [name], female   DOB: 1951-03-01, 61 y.o.   MRN: [id number]  Subjective:    Patient ID: Courtney [name], female    DOB: 1950-07-11, 61 y.o.   MRN: [id number]  HPI The patient is here for a wellness exam. The patient has been doing well overall without major physical or psychological issues going on lately.  The patient presents for a follow-up of  chronic hypertension, chronic dyslipidemia, OA L hip controlled with medicines. She has spinal stenosis Dr Despina Hick an a spinal surgeon  Wt Readings from Last 3 Encounters:  10/02/11 121 lb (54.885 kg)  12/25/10 123 lb (55.792 kg)  06/20/10 110 lb (49.896 kg)   BP Readings from Last 3 Encounters:  10/02/11 100/62  12/25/10 120/80  09/09/10 121/59      Review of Systems  Constitutional: Negative for chills, activity change, appetite change, fatigue and unexpected weight change.  HENT: Negative for congestion, mouth sores and sinus pressure.   Eyes: Negative for visual disturbance.  Respiratory: Negative for cough and chest tightness.   Gastrointestinal: Negative for nausea and abdominal pain.  Genitourinary: Negative for frequency, difficulty urinating and vaginal pain.  Musculoskeletal: Positive for back pain and arthralgias (SI L). Negative for gait problem.  Skin: Negative for pallor and rash.  Neurological: Negative for dizziness, tremors, weakness, numbness and headaches.  Psychiatric/Behavioral: Negative for confusion and sleep disturbance. The patient is not nervous/anxious.        Objective:   Physical Exam  Constitutional: She appears well-developed and well-nourished. No distress.  HENT:  Head: Normocephalic.  Right Ear: External ear normal.  Left Ear: External ear normal.  Nose: Nose normal.  Mouth/Throat: Oropharynx is clear and moist.  Eyes: Conjunctivae are normal. Pupils are equal, round, and reactive to light. Right eye exhibits no discharge. Left eye exhibits no discharge.  Neck:  Normal range of motion. Neck supple. No JVD present. No tracheal deviation present. No thyromegaly present.  Cardiovascular: Normal rate, regular rhythm and normal heart sounds.   Pulmonary/Chest: No stridor. No respiratory distress. She has no wheezes.  Abdominal: Soft. Bowel sounds are normal. She exhibits no distension and no mass. There is no tenderness. There is no rebound and no guarding.  Musculoskeletal: She exhibits tenderness (L SI is tender). She exhibits no edema.  Lymphadenopathy:    She has no cervical adenopathy.  Neurological: She displays normal reflexes. No cranial nerve deficit. She exhibits normal muscle tone. Coordination normal.  Skin: No rash noted. No erythema.  Psychiatric: She has a normal mood and affect. Her behavior is normal. Judgment and thought content normal.      Lab Results  Component Value Date   WBC 5.6 12/23/2010   HGB 13.6 12/23/2010   HCT 39.8 12/23/2010   PLT 261.0 12/23/2010   CHOL 229* 12/23/2010   TRIG 42.0 12/23/2010   HDL 104.20 12/23/2010   LDLDIRECT 110.0 12/23/2010   ALT 17 12/23/2010   AST 22 12/23/2010   NA 138 12/23/2010   K 4.1 12/23/2010   CL 101 12/23/2010   CREATININE 0.8 12/23/2010   BUN 15 12/23/2010   CO2 31 12/23/2010   TSH 1.05 12/23/2010         Assessment & Plan:

## 2011-10-02 NOTE — Assessment & Plan Note (Signed)
We discussed age appropriate health related issues, including available/recomended screening tests and vaccinations. We discussed a need for adhering to healthy diet and exercise. Labs/EKG were reviewed/ordered. All questions were answered.   

## 2011-10-02 NOTE — Assessment & Plan Note (Signed)
Doing good now.   

## 2011-10-02 NOTE — Assessment & Plan Note (Signed)
  On diet  

## 2011-10-02 NOTE — Assessment & Plan Note (Signed)
L SI and L hip OA, chronic S/p R THR Dr Darrick Penna, Dr Abel Presto was scheduled and she cancelled it for now

## 2011-10-02 NOTE — Assessment & Plan Note (Signed)
Continue with current prescription therapy as reflected on the Med list.  

## 2011-10-03 ENCOUNTER — Ambulatory Visit: Payer: BC Managed Care – PPO

## 2011-10-03 ENCOUNTER — Telehealth: Payer: Self-pay | Admitting: Internal Medicine

## 2011-10-03 LAB — ANA: Anti Nuclear Antibody(ANA): NEGATIVE

## 2011-10-03 NOTE — Telephone Encounter (Signed)
Dr Azzie Roup in [place] Thx

## 2011-10-03 NOTE — Telephone Encounter (Signed)
Pt informed. Copies mailed. She doesn't want a referral to rheumatology. She wants the name of one you would recommend.

## 2011-10-03 NOTE — Telephone Encounter (Signed)
Courtney [name], please, inform patient that all labs are normal except for a little elevated rheumatoid test (RA test) - most likely of no significance. ANA was negative. Would she like to see a rheumatologist?  Please, mail the labs to the patient.

## 2011-10-05 ENCOUNTER — Encounter: Payer: Self-pay | Admitting: Internal Medicine

## 2011-10-07 NOTE — Telephone Encounter (Signed)
Pt informed

## 2011-11-03 ENCOUNTER — Ambulatory Visit (INDEPENDENT_AMBULATORY_CARE_PROVIDER_SITE_OTHER): Payer: Federal, State, Local not specified - PPO | Admitting: Internal Medicine

## 2011-11-03 ENCOUNTER — Encounter: Payer: Self-pay | Admitting: Internal Medicine

## 2011-11-03 VITALS — BP 124/72 | HR 83 | Temp 98.7°F | Ht 61.0 in | Wt 122.5 lb

## 2011-11-03 DIAGNOSIS — M1990 Unspecified osteoarthritis, unspecified site: Secondary | ICD-10-CM

## 2011-11-03 DIAGNOSIS — F411 Generalized anxiety disorder: Secondary | ICD-10-CM

## 2011-11-03 DIAGNOSIS — R21 Rash and other nonspecific skin eruption: Secondary | ICD-10-CM | POA: Insufficient documentation

## 2011-11-03 MED ORDER — TRIAMCINOLONE ACETONIDE 0.5 % EX CREA: TOPICAL_CREAM | Freq: Two times a day (BID) | CUTANEOUS | Status: DC | PRN

## 2011-11-03 MED ORDER — VALACYCLOVIR HCL 1 G PO TABS: 1000.0000 mg | ORAL_TABLET | Freq: Three times a day (TID) | ORAL | Status: DC

## 2011-11-03 NOTE — Assessment & Plan Note (Signed)
Right prox thigh prob now crusting shingles (after possible recent Acute gastroenteritis like illness now improved) - for valtrex asd,  to f/u any worsening symptoms or concerns; also for triam cr for the rash to the right upper chest and foot I supect not related, incidental and possibly allergic related

## 2011-11-03 NOTE — Patient Instructions (Addendum)
Take all new medications as prescribed Continue all other medications as before Please call if worse in 3-5 days, to consider course of doxycycline

## 2011-11-03 NOTE — Assessment & Plan Note (Signed)
Significant, decliens change in tx today,  to f/u any worsening symptoms or concerns

## 2011-11-03 NOTE — Assessment & Plan Note (Signed)
Did have RF + recent but no clinical evidence for RA otherwise, advised ok to hold on rheum eval, - ? False pos RF -  to f/u any worsening symptoms or concerns

## 2011-11-03 NOTE — Progress Notes (Signed)
Subjective:    Patient ID: Courtney [name], female    DOB: February 13, 1951, 61 y.o.   MRN: [id number]  HPI  Here with new onset 6 days rash to right groin area with several tender what sound like vesicular lesions now crusted over with discomfort;  Also with itchy rash to right upper chest, and several similar spots to right foot that she wonders might be related, with some improvement with "essential oils" possibly to the rash to the right chest.  Has had some fever now improved, and several days mid last wk of feeling poorly, n/v , abd cramps and loose stool now improved.  Pt denies chest pain, increased sob or doe, wheezing, orthopnea, PND, increased LE swelling, palpitations, dizziness or syncope.  Pt denies new neurological symptoms such as new headache, or facial or extremity weakness or numbness   Pt denies polydipsia, polyuria.  Has lots of questions regarding her recent lab including the Rheum factor +, did not see rheum as she was convinced of the need.  Has had OA pain of the hand and lumbar recurrent pain/hip pain, but no clear c/o polyarthritis c/w RA. Past Medical History  Diagnosis Date  . IBS (irritable bowel syndrome)   . ADD (attention deficit disorder)   . Shoulder pain, right   . Hyperlipidemia   . Osteoarthritis   . Osteoporosis   . Depression     no bipolar per Dr. Nolen Mu  . Celiac disease     possible vs IBS  . GI problem     [name]  . Gynandrism     Collins  . Anxiety    Past Surgical History  Procedure Date  . Total hip arthroplasty     right. 2002    reports that she has quit smoking. She does not have any smokeless tobacco history on file. She reports that she does not drink alcohol or use illicit drugs. family history includes ADD / ADHD in an unspecified family member and Coronary artery disease in an unspecified family member. Allergies  Allergen Reactions  . Clarithromycin     REACTION: nausea  . Levofloxacin     REACTION: nausea  . Loratadine       REACTION: bruises  . Olanzapine   . Propoxyphene-Acetaminophen    Current Outpatient Prescriptions on File Prior to Visit  Medication Sig Dispense Refill  . amphetamine-dextroamphetamine (ADDERALL) 10 MG tablet 1 up to four times a day.       . Omega-3 Fatty Acids (OMEGA 3 PO) Take 3,200 g by mouth daily.        [name] venlafaxine (EFFEXOR-XR) 37.5 MG 24 hr capsule Take 37.5 mg by mouth daily.        . Cholecalciferol (VITAMIN D3) 1000 UNITS tablet Take 1,000 Units by mouth daily.        . diazepam (VALIUM) 5 MG tablet Take 5 mg by mouth 2 (two) times daily.        . Diclofenac Sodium (PENNSAID) 1.5 % SOLN 3-5 gtt on skin three times a day for pain.       . fluticasone (FLONASE) 50 MCG/ACT nasal spray Place 2 sprays into the nose daily.  16 g  11  . LORazepam (ATIVAN) 0.5 MG tablet Take 0.5 mg by mouth 2 (two) times daily as needed. For anxiety.       . pregabalin (LYRICA) 75 MG capsule Take 1 capsule (75 mg total) by mouth 3 (three) times daily as needed.  90 capsule  3  .  DISCONTD: triamcinolone (KENALOG) 0.5 % cream Apply topically 2 (two) times daily as needed.         Review of Systems Review of Systems  Constitutional: Negative for diaphoresis and unexpected weight change.  HENT: Negative for tinnitus.   Eyes: Negative for photophobia and visual disturbance.  Respiratory: Negative for choking and stridor.   Gastrointestinal: Negative for blood in stool.  Genitourinary: Negative for hematuria and decreased urine volume.  Musculoskeletal: Negative for gait problem.  Neurological: Negative for tremors and numbness.     Objective:   Physical Exam BP 124/72  Pulse 83  Temp 98.7 F (37.1 C) (Oral)  Ht 5\' 1"  (1.549 m)  Wt 122 lb 8 oz (55.566 kg)  BMI 23.15 kg/m2  SpO2 98% Physical Exam  VS noted Constitutional: Pt appears well-developed and well-nourished.  HENT: Head: Normocephalic.  Right Ear: External ear normal.  Left Ear: External ear normal.  Eyes: Conjunctivae and EOM  are normal. Pupils are equal, round, and reactive to light.  Neck: Normal range of motion. Neck supple.  Cardiovascular: Normal rate and regular rhythm.   Pulmonary/Chest: Effort normal and breath sounds normal.  Abd:  Soft, NT, non-distended, + BS Neurological: Pt is alert. Not confused Skin: Skin is warm. Has several crusted lesions to prox right thigh possibly c/w recent shingles types grouped vesicles on erythema base, mild tender; right upper chest and foot also with several different type erythem small raised lesions nonvesicular but pruritic Psychiatric: . Thought content normal, but 1-2+ nervous, tense, irritable, almost angry/argumentative  Joint: no acute synovitis, has mild OA changes of the fingers    Assessment & Plan:

## 2011-12-22 ENCOUNTER — Other Ambulatory Visit: Payer: Self-pay | Admitting: Orthopedic Surgery

## 2011-12-22 MED ORDER — BUPIVACAINE LIPOSOME 1.3 % IJ SUSP: 20.0000 mL | Freq: Once | INTRAMUSCULAR | Status: DC

## 2011-12-22 MED ORDER — DEXAMETHASONE SODIUM PHOSPHATE 10 MG/ML IJ SOLN: 10.0000 mg | Freq: Once | INTRAMUSCULAR | Status: DC

## 2011-12-22 NOTE — Progress Notes (Signed)
Preoperative surgical orders have been place into the Epic hospital system for Courtney [name] on 12/22/2011, 8:52 AM  by Patrica Duel for surgery on 01/14/2012.  Preop Total Hip orders including Experel Injecion, IV Tylenol, and IV Decadron as long as there are no contraindications to the above medications. Avel Peace, PA-C

## 2012-01-05 ENCOUNTER — Encounter (HOSPITAL_COMMUNITY): Payer: Self-pay | Admitting: Pharmacy Technician

## 2012-01-07 NOTE — Patient Instructions (Addendum)
20 Courtney [name]  01/07/2012   Your procedure is scheduled on:  9-18 -2013  Report to [facility] at    0630    AM..  Call this number if you have problems the morning of surgery: [phone]  Or Presurgical Testing [phone](Kashina Mecum)   Remember:   Do not eat food:After Midnight.    Take these medicines the morning of surgery with A SIP OF WATER: Effexor, Hydrocodone. Stop all over the counter supplements, herbal products, aspirin, ibuprofen, aleve. Tylenol is okay.   Do not wear jewelry, make-up or nail polish.  Do not wear lotions, powders, or perfumes. You may wear deodorant.  Do not shave 48 hours prior to surgery.(face and neck okay, no shaving of legs)  Do not bring valuables to the hospital.  Contacts, dentures or bridgework may not be worn into surgery.  Leave suitcase in the car. After surgery it may be brought to your room.  For patients admitted to the hospital, checkout time is 11:00 AM the day of discharge.   Patients discharged the day of surgery will not be allowed to drive home. Must have responsible person with you x 24 hours once discharged.  Name and phone number of your driver: Fulton Reek ,spouse [phone] Special Instructions: CHG Shower Use Special Wash: 1/2 bottle night before surgery and 1/2 bottle morning of surgery.(avoid face and genitals)   Please read over the following fact sheets that you were given: MRSA Information, Blood Transfusion fact sheet, Incentive Spirometry Instruction.

## 2012-01-08 ENCOUNTER — Encounter (HOSPITAL_COMMUNITY)
Admission: RE | Admit: 2012-01-08 | Discharge: 2012-01-08 | Disposition: A | Discharge status: Home / Self Care | Payer: Federal, State, Local not specified - PPO | Source: Ambulatory Visit | Attending: Orthopedic Surgery | Admitting: Orthopedic Surgery

## 2012-01-08 ENCOUNTER — Ambulatory Visit (HOSPITAL_COMMUNITY)
Admission: RE | Admit: 2012-01-08 | Discharge: 2012-01-08 | Disposition: A | Discharge status: Home / Self Care | Payer: Federal, State, Local not specified - PPO | Source: Ambulatory Visit | Attending: Orthopedic Surgery | Admitting: Orthopedic Surgery

## 2012-01-08 ENCOUNTER — Encounter (HOSPITAL_COMMUNITY): Payer: Self-pay

## 2012-01-08 DIAGNOSIS — Z01812 Encounter for preprocedural laboratory examination: Secondary | ICD-10-CM | POA: Insufficient documentation

## 2012-01-08 DIAGNOSIS — M1610 Unilateral primary osteoarthritis, unspecified hip: Secondary | ICD-10-CM | POA: Insufficient documentation

## 2012-01-08 DIAGNOSIS — Z01810 Encounter for preprocedural cardiovascular examination: Secondary | ICD-10-CM | POA: Insufficient documentation

## 2012-01-08 DIAGNOSIS — J302 Other seasonal allergic rhinitis: Secondary | ICD-10-CM

## 2012-01-08 DIAGNOSIS — M169 Osteoarthritis of hip, unspecified: Secondary | ICD-10-CM | POA: Insufficient documentation

## 2012-01-08 HISTORY — PX: VENTRAL HERNIA REPAIR: SHX424

## 2012-01-08 HISTORY — DX: Other seasonal allergic rhinitis: J30.2

## 2012-01-08 HISTORY — PX: EXPLORATORY LAPAROTOMY: SUR591

## 2012-01-08 LAB — SURGICAL PCR SCREEN
MRSA, PCR: NEGATIVE
Staphylococcus aureus: POSITIVE — AB

## 2012-01-08 LAB — COMPREHENSIVE METABOLIC PANEL
ALT: 12 U/L (ref 0–35)
AST: 18 U/L (ref 0–37)
Albumin: 4.3 g/dL (ref 3.5–5.2)
Alkaline Phosphatase: 58 U/L (ref 39–117)
BUN: 11 mg/dL (ref 6–23)
CO2: 31 mEq/L (ref 19–32)
Calcium: 9.9 mg/dL (ref 8.4–10.5)
Chloride: 96 mEq/L (ref 96–112)
Creatinine, Ser: 0.7 mg/dL (ref 0.50–1.10)
GFR calc Af Amer: >90 mL/min (ref >90)
GFR calc non Af Amer: >90 mL/min (ref >90)
Glucose, Bld: 88 mg/dL (ref 70–99)
Potassium: 4.2 mEq/L (ref 3.5–5.1)
Sodium: 135 mEq/L (ref 135–145)
Total Bilirubin: 0.4 mg/dL (ref 0.3–1.2)
Total Protein: 7.2 g/dL (ref 6.0–8.3)

## 2012-01-08 LAB — CBC
HCT: 42.6 % (ref 36.0–46.0)
Hemoglobin: 14.2 g/dL (ref 12.0–15.0)
MCH: 31.1 pg (ref 26.0–34.0)
MCHC: 33.3 g/dL (ref 30.0–36.0)
MCV: 93.4 fL (ref 78.0–100.0)
Platelets: 369 10*3/uL (ref 150–400)
RBC: 4.56 MIL/uL (ref 3.87–5.11)
RDW: 12.5 % (ref 11.5–15.5)
WBC: 5.8 10*3/uL (ref 4.0–10.5)

## 2012-01-08 LAB — URINALYSIS, ROUTINE W REFLEX MICROSCOPIC
Bilirubin Urine: NEGATIVE
Glucose, UA: NEGATIVE mg/dL
Hgb urine dipstick: NEGATIVE
Ketones, ur: NEGATIVE mg/dL
Leukocytes, UA: NEGATIVE
Nitrite: NEGATIVE
Protein, ur: NEGATIVE mg/dL
Specific Gravity, Urine: 1.013 (ref 1.005–1.030)
Urobilinogen, UA: 0.2 mg/dL (ref 0.0–1.0)
pH: 8 (ref 5.0–8.0)

## 2012-01-08 LAB — APTT: aPTT: 35 seconds (ref 24–37)

## 2012-01-08 LAB — PROTIME-INR
INR: 0.96 (ref 0.00–1.49)
Prothrombin Time: 13 seconds (ref 11.6–15.2)

## 2012-01-08 NOTE — Pre-Procedure Instructions (Signed)
01-08-12 1615 Pt. Notified of Positive Staph aureus -PCR screen, will get Rx. Filled for Mupirocin and use as directed.

## 2012-01-13 MED ORDER — CEFAZOLIN SODIUM-DEXTROSE 2-3 GM-% IV SOLR
2.0000 g | INTRAVENOUS | Status: AC
  Administered 2012-01-14: 2 g via INTRAVENOUS

## 2012-01-13 MED ORDER — SODIUM CHLORIDE 0.9 % IV SOLN: INTRAVENOUS | Status: DC

## 2012-01-14 ENCOUNTER — Encounter (HOSPITAL_COMMUNITY): Payer: Self-pay | Admitting: *Deleted

## 2012-01-14 ENCOUNTER — Inpatient Hospital Stay (HOSPITAL_COMMUNITY): Payer: Federal, State, Local not specified - PPO | Admitting: Registered Nurse

## 2012-01-14 ENCOUNTER — Encounter (HOSPITAL_COMMUNITY): Payer: Self-pay | Admitting: Registered Nurse

## 2012-01-14 ENCOUNTER — Inpatient Hospital Stay (HOSPITAL_COMMUNITY): Payer: Federal, State, Local not specified - PPO

## 2012-01-14 ENCOUNTER — Encounter (HOSPITAL_COMMUNITY)
Admission: RE | Disposition: A | Discharge status: Home Health | Payer: Self-pay | Source: Ambulatory Visit | Attending: Orthopedic Surgery

## 2012-01-14 ENCOUNTER — Inpatient Hospital Stay (HOSPITAL_COMMUNITY)
Admission: RE | Admit: 2012-01-14 | Discharge: 2012-01-17 | DRG: 818 | Disposition: A | Discharge status: Home Health | Payer: Federal, State, Local not specified - PPO | Source: Ambulatory Visit | Attending: Orthopedic Surgery | Admitting: Orthopedic Surgery

## 2012-01-14 DIAGNOSIS — R209 Unspecified disturbances of skin sensation: Secondary | ICD-10-CM | POA: Diagnosis not present

## 2012-01-14 DIAGNOSIS — F3289 Other specified depressive episodes: Secondary | ICD-10-CM | POA: Diagnosis present

## 2012-01-14 DIAGNOSIS — M169 Osteoarthritis of hip, unspecified: Secondary | ICD-10-CM | POA: Diagnosis present

## 2012-01-14 DIAGNOSIS — Z96649 Presence of unspecified artificial hip joint: Secondary | ICD-10-CM

## 2012-01-14 DIAGNOSIS — D649 Anemia, unspecified: Secondary | ICD-10-CM | POA: Diagnosis present

## 2012-01-14 DIAGNOSIS — F411 Generalized anxiety disorder: Secondary | ICD-10-CM | POA: Diagnosis present

## 2012-01-14 DIAGNOSIS — M1610 Unilateral primary osteoarthritis, unspecified hip: Principal | ICD-10-CM | POA: Diagnosis present

## 2012-01-14 DIAGNOSIS — F329 Major depressive disorder, single episode, unspecified: Secondary | ICD-10-CM | POA: Diagnosis present

## 2012-01-14 HISTORY — PX: TOTAL HIP ARTHROPLASTY: SHX124

## 2012-01-14 LAB — ABO/RH: ABO/RH(D): O POS

## 2012-01-14 LAB — TYPE AND SCREEN
ABO/RH(D): O POS
Antibody Screen: NEGATIVE

## 2012-01-14 SURGERY — ARTHROPLASTY, HIP, TOTAL,POSTERIOR APPROACH
Anesthesia: Spinal | Site: Hip | Laterality: Left | Wound class: Clean

## 2012-01-14 MED ORDER — METOCLOPRAMIDE HCL 5 MG/ML IJ SOLN: 5.0000 mg | Freq: Three times a day (TID) | INTRAMUSCULAR | Status: DC | PRN

## 2012-01-14 MED ORDER — AMPHETAMINE-DEXTROAMPHETAMINE 20 MG PO TABS
10.0000 mg | ORAL_TABLET | Freq: Two times a day (BID) | ORAL | Status: DC
  Administered 2012-01-15 – 2012-01-17 (×5): 10 mg via ORAL
  Filled 2012-01-14 (×4): qty 1

## 2012-01-14 MED ORDER — 0.9 % SODIUM CHLORIDE (POUR BTL) OPTIME
TOPICAL | Status: DC | PRN
  Administered 2012-01-14: 1000 mL

## 2012-01-14 MED ORDER — CEFAZOLIN SODIUM 1-5 GM-% IV SOLN
1.0000 g | Freq: Four times a day (QID) | INTRAVENOUS | Status: AC
  Administered 2012-01-14 (×2): 1 g via INTRAVENOUS
  Filled 2012-01-14 (×2): qty 50

## 2012-01-14 MED ORDER — MIDAZOLAM HCL 5 MG/5ML IJ SOLN
INTRAMUSCULAR | Status: DC | PRN
  Administered 2012-01-14: 2 mg via INTRAVENOUS

## 2012-01-14 MED ORDER — HYDROMORPHONE HCL PF 1 MG/ML IJ SOLN
INTRAMUSCULAR | Status: AC
  Filled 2012-01-14: qty 1

## 2012-01-14 MED ORDER — METHOCARBAMOL 100 MG/ML IJ SOLN
500.0000 mg | Freq: Four times a day (QID) | INTRAVENOUS | Status: DC | PRN
  Administered 2012-01-14 (×3): 500 mg via INTRAVENOUS
  Filled 2012-01-14 (×3): qty 5

## 2012-01-14 MED ORDER — OXYCODONE HCL 5 MG PO TABS: 5.0000 mg | ORAL_TABLET | Freq: Once | ORAL | Status: DC | PRN

## 2012-01-14 MED ORDER — METHOCARBAMOL 500 MG PO TABS
500.0000 mg | ORAL_TABLET | Freq: Four times a day (QID) | ORAL | Status: DC | PRN
  Administered 2012-01-15 – 2012-01-16 (×4): 500 mg via ORAL
  Filled 2012-01-14 (×4): qty 1

## 2012-01-14 MED ORDER — ACETAMINOPHEN 10 MG/ML IV SOLN
1000.0000 mg | Freq: Once | INTRAVENOUS | Status: AC
  Administered 2012-01-14: 1000 mg via INTRAVENOUS

## 2012-01-14 MED ORDER — LIDOCAINE HCL (CARDIAC) 20 MG/ML IV SOLN
INTRAVENOUS | Status: DC | PRN
  Administered 2012-01-14: 40 mg via INTRAVENOUS

## 2012-01-14 MED ORDER — LACTATED RINGERS IV SOLN
INTRAVENOUS | Status: DC | PRN
  Administered 2012-01-14 (×2): via INTRAVENOUS

## 2012-01-14 MED ORDER — LACTATED RINGERS IV SOLN
INTRAVENOUS | Status: DC
  Administered 2012-01-14: 11:00:00 via INTRAVENOUS

## 2012-01-14 MED ORDER — ACETAMINOPHEN 650 MG RE SUPP: 650.0000 mg | Freq: Four times a day (QID) | RECTAL | Status: DC | PRN

## 2012-01-14 MED ORDER — MEPERIDINE HCL 50 MG/ML IJ SOLN
6.2500 mg | INTRAMUSCULAR | Status: DC | PRN
Start: 2012-01-14 — End: 2012-01-14

## 2012-01-14 MED ORDER — PROPOFOL 10 MG/ML IV BOLUS: INTRAVENOUS | Status: DC | PRN

## 2012-01-14 MED ORDER — DOCUSATE SODIUM 100 MG PO CAPS
100.0000 mg | ORAL_CAPSULE | Freq: Two times a day (BID) | ORAL | Status: DC
  Administered 2012-01-14 – 2012-01-17 (×5): 100 mg via ORAL

## 2012-01-14 MED ORDER — RIVAROXABAN 10 MG PO TABS
10.0000 mg | ORAL_TABLET | Freq: Every day | ORAL | Status: DC
  Administered 2012-01-15 – 2012-01-17 (×3): 10 mg via ORAL
  Filled 2012-01-14 (×4): qty 1

## 2012-01-14 MED ORDER — MENTHOL 3 MG MT LOZG
1.0000 | LOZENGE | OROMUCOSAL | Status: DC | PRN
  Filled 2012-01-14: qty 9

## 2012-01-14 MED ORDER — PROPOFOL 10 MG/ML IV BOLUS
INTRAVENOUS | Status: DC | PRN
  Administered 2012-01-14: 40 mg via INTRAVENOUS

## 2012-01-14 MED ORDER — ACETAMINOPHEN 10 MG/ML IV SOLN: 1000.0000 mg | Freq: Once | INTRAVENOUS | Status: DC | PRN

## 2012-01-14 MED ORDER — DEXTROSE-NACL 5-0.9 % IV SOLN
INTRAVENOUS | Status: DC
  Administered 2012-01-14 – 2012-01-15 (×2): via INTRAVENOUS

## 2012-01-14 MED ORDER — TEMAZEPAM 15 MG PO CAPS: 15.0000 mg | ORAL_CAPSULE | Freq: Every evening | ORAL | Status: DC | PRN

## 2012-01-14 MED ORDER — PROPOFOL 10 MG/ML IV EMUL
INTRAVENOUS | Status: DC | PRN
  Administered 2012-01-14: 120 ug/kg/min via INTRAVENOUS

## 2012-01-14 MED ORDER — ACETAMINOPHEN 10 MG/ML IV SOLN
1000.0000 mg | Freq: Four times a day (QID) | INTRAVENOUS | Status: AC
  Administered 2012-01-14 – 2012-01-15 (×4): 1000 mg via INTRAVENOUS
  Filled 2012-01-14 (×6): qty 100

## 2012-01-14 MED ORDER — METOCLOPRAMIDE HCL 10 MG PO TABS: 5.0000 mg | ORAL_TABLET | Freq: Three times a day (TID) | ORAL | Status: DC | PRN

## 2012-01-14 MED ORDER — BISACODYL 10 MG RE SUPP: 10.0000 mg | Freq: Every day | RECTAL | Status: DC | PRN

## 2012-01-14 MED ORDER — PROMETHAZINE HCL 25 MG/ML IJ SOLN: 6.2500 mg | INTRAMUSCULAR | Status: DC | PRN

## 2012-01-14 MED ORDER — BUPIVACAINE LIPOSOME 1.3 % IJ SUSP
20.0000 mL | Freq: Once | INTRAMUSCULAR | Status: DC
  Filled 2012-01-14: qty 20

## 2012-01-14 MED ORDER — PHENYLEPHRINE HCL 10 MG/ML IJ SOLN
10.0000 mg | INTRAVENOUS | Status: DC | PRN
  Administered 2012-01-14: 30 ug/min via INTRAVENOUS

## 2012-01-14 MED ORDER — DIPHENHYDRAMINE HCL 12.5 MG/5ML PO ELIX: 12.5000 mg | ORAL_SOLUTION | ORAL | Status: DC | PRN

## 2012-01-14 MED ORDER — BUPIVACAINE HCL 0.75 % IJ SOLN
INTRAMUSCULAR | Status: DC | PRN
  Administered 2012-01-14: 1.6 mL

## 2012-01-14 MED ORDER — FLEET ENEMA 7-19 GM/118ML RE ENEM: 1.0000 | ENEMA | Freq: Once | RECTAL | Status: AC | PRN

## 2012-01-14 MED ORDER — POLYETHYLENE GLYCOL 3350 17 G PO PACK: 17.0000 g | PACK | Freq: Every day | ORAL | Status: DC | PRN

## 2012-01-14 MED ORDER — VENLAFAXINE HCL ER 37.5 MG PO CP24
37.5000 mg | ORAL_CAPSULE | Freq: Every day | ORAL | Status: DC
  Administered 2012-01-15 – 2012-01-17 (×3): 37.5 mg via ORAL
  Filled 2012-01-14 (×4): qty 1

## 2012-01-14 MED ORDER — HYDROMORPHONE HCL PF 1 MG/ML IJ SOLN
0.2500 mg | INTRAMUSCULAR | Status: DC | PRN
  Administered 2012-01-14 (×3): 0.5 mg via INTRAVENOUS

## 2012-01-14 MED ORDER — FENTANYL CITRATE 0.05 MG/ML IJ SOLN
INTRAMUSCULAR | Status: DC | PRN
  Administered 2012-01-14: 100 ug via INTRAVENOUS

## 2012-01-14 MED ORDER — HYDROMORPHONE HCL PF 1 MG/ML IJ SOLN
0.5000 mg | INTRAMUSCULAR | Status: DC | PRN
  Administered 2012-01-14 – 2012-01-15 (×4): 1 mg via INTRAVENOUS
  Administered 2012-01-16: 0.5 mg via INTRAVENOUS
  Filled 2012-01-14 (×5): qty 1

## 2012-01-14 MED ORDER — HETASTARCH-ELECTROLYTES 6 % IV SOLN
INTRAVENOUS | Status: DC | PRN
  Administered 2012-01-14: 09:00:00 via INTRAVENOUS

## 2012-01-14 MED ORDER — BUPIVACAINE LIPOSOME 1.3 % IJ SUSP
INTRAMUSCULAR | Status: DC | PRN
  Administered 2012-01-14: 20 mL

## 2012-01-14 MED ORDER — ACETAMINOPHEN 325 MG PO TABS
650.0000 mg | ORAL_TABLET | Freq: Four times a day (QID) | ORAL | Status: DC | PRN
  Administered 2012-01-16: 650 mg via ORAL
  Filled 2012-01-14: qty 2

## 2012-01-14 MED ORDER — ONDANSETRON HCL 4 MG PO TABS: 4.0000 mg | ORAL_TABLET | Freq: Four times a day (QID) | ORAL | Status: DC | PRN

## 2012-01-14 MED ORDER — ONDANSETRON HCL 4 MG/2ML IJ SOLN
4.0000 mg | Freq: Four times a day (QID) | INTRAMUSCULAR | Status: DC | PRN
  Administered 2012-01-15: 4 mg via INTRAVENOUS
  Filled 2012-01-14: qty 2

## 2012-01-14 MED ORDER — OXYCODONE HCL 5 MG PO TABS
5.0000 mg | ORAL_TABLET | ORAL | Status: DC | PRN
Start: 2012-01-14 — End: 2012-01-17
  Administered 2012-01-14 – 2012-01-15 (×2): 10 mg via ORAL
  Administered 2012-01-15: 15 mg via ORAL
  Administered 2012-01-15 (×3): 10 mg via ORAL
  Administered 2012-01-16 (×2): 5 mg via ORAL
  Administered 2012-01-16 (×3): 10 mg via ORAL
  Administered 2012-01-16: 5 mg via ORAL
  Administered 2012-01-16 – 2012-01-17 (×2): 10 mg via ORAL
  Filled 2012-01-14: qty 3
  Filled 2012-01-14 (×2): qty 2
  Filled 2012-01-14: qty 1
  Filled 2012-01-14 (×10): qty 2
  Filled 2012-01-14: qty 1

## 2012-01-14 MED ORDER — CHLORHEXIDINE GLUCONATE 4 % EX LIQD
60.0000 mL | Freq: Once | CUTANEOUS | Status: DC
  Filled 2012-01-14: qty 60

## 2012-01-14 MED ORDER — PHENOL 1.4 % MT LIQD
1.0000 | OROMUCOSAL | Status: DC | PRN
  Filled 2012-01-14: qty 177

## 2012-01-14 MED ORDER — OXYCODONE HCL 5 MG/5ML PO SOLN
5.0000 mg | Freq: Once | ORAL | Status: DC | PRN
  Filled 2012-01-14: qty 5

## 2012-01-14 MED ORDER — ALUM & MAG HYDROXIDE-SIMETH 200-200-20 MG/5ML PO SUSP
30.0000 mL | ORAL | Status: DC | PRN
  Administered 2012-01-15 – 2012-01-17 (×6): 30 mL via ORAL
  Filled 2012-01-14 (×6): qty 30

## 2012-01-14 SURGICAL SUPPLY — 48 items
BAG ZIPLOCK 12X15 (MISCELLANEOUS) ×2 IMPLANT
BIT DRILL 2.8X128 (BIT) ×2 IMPLANT
BLADE EXTENDED COATED 6.5IN (ELECTRODE) ×2 IMPLANT
BLADE SAW SAG 73X25 THK (BLADE) ×1
BLADE SAW SGTL 73X25 THK (BLADE) ×1 IMPLANT
CLOTH BEACON ORANGE TIMEOUT ST (SAFETY) ×2 IMPLANT
DECANTER SPIKE VIAL GLASS SM (MISCELLANEOUS) ×2 IMPLANT
DRAPE INCISE IOBAN 66X45 STRL (DRAPES) ×2 IMPLANT
DRAPE ORTHO SPLIT 77X108 STRL (DRAPES) ×2
DRAPE POUCH INSTRU U-SHP 10X18 (DRAPES) ×2 IMPLANT
DRAPE SURG ORHT 6 SPLT 77X108 (DRAPES) ×2 IMPLANT
DRAPE U-SHAPE 47X51 STRL (DRAPES) ×2 IMPLANT
DRSG ADAPTIC 3X8 NADH LF (GAUZE/BANDAGES/DRESSINGS) ×2 IMPLANT
DRSG MEPILEX BORDER 4X4 (GAUZE/BANDAGES/DRESSINGS) ×2 IMPLANT
DRSG MEPILEX BORDER 4X8 (GAUZE/BANDAGES/DRESSINGS) ×2 IMPLANT
DURAPREP 26ML APPLICATOR (WOUND CARE) ×2 IMPLANT
ELECT REM PT RETURN 9FT ADLT (ELECTROSURGICAL) ×2
ELECTRODE REM PT RTRN 9FT ADLT (ELECTROSURGICAL) ×1 IMPLANT
EVACUATOR 1/8 PVC DRAIN (DRAIN) ×2 IMPLANT
FACESHIELD LNG OPTICON STERILE (SAFETY) ×8 IMPLANT
GLOVE BIO SURGEON STRL SZ8 (GLOVE) ×2 IMPLANT
GLOVE BIOGEL PI IND STRL 8 (GLOVE) ×2 IMPLANT
GLOVE BIOGEL PI INDICATOR 8 (GLOVE) ×2
GLOVE ECLIPSE 8.0 STRL XLNG CF (GLOVE) ×2 IMPLANT
GOWN STRL NON-REIN LRG LVL3 (GOWN DISPOSABLE) ×2 IMPLANT
GOWN STRL REIN XL XLG (GOWN DISPOSABLE) ×4 IMPLANT
IMMOBILIZER KNEE 20 (SOFTGOODS) ×2
IMMOBILIZER KNEE 20 THIGH 36 (SOFTGOODS) ×1 IMPLANT
KIT BASIN OR (CUSTOM PROCEDURE TRAY) ×2 IMPLANT
MANIFOLD NEPTUNE II (INSTRUMENTS) ×2 IMPLANT
NDL SAFETY ECLIPSE 18X1.5 (NEEDLE) ×1 IMPLANT
NEEDLE HYPO 18GX1.5 SHARP (NEEDLE) ×1
NS IRRIG 1000ML POUR BTL (IV SOLUTION) ×2 IMPLANT
PACK TOTAL JOINT (CUSTOM PROCEDURE TRAY) ×2 IMPLANT
PASSER SUT SWANSON 36MM LOOP (INSTRUMENTS) ×2 IMPLANT
POSITIONER SURGICAL ARM (MISCELLANEOUS) ×2 IMPLANT
SPONGE GAUZE 4X4 12PLY (GAUZE/BANDAGES/DRESSINGS) ×2 IMPLANT
STRIP CLOSURE SKIN 1/2X4 (GAUZE/BANDAGES/DRESSINGS) ×4 IMPLANT
SUT ETHIBOND NAB CT1 #1 30IN (SUTURE) ×4 IMPLANT
SUT MNCRL AB 4-0 PS2 18 (SUTURE) ×2 IMPLANT
SUT VIC AB 2-0 CT1 27 (SUTURE) ×3
SUT VIC AB 2-0 CT1 TAPERPNT 27 (SUTURE) ×3 IMPLANT
SUT VLOC 180 0 24IN GS25 (SUTURE) ×2 IMPLANT
SYR 50ML LL SCALE MARK (SYRINGE) ×2 IMPLANT
TOWEL OR 17X26 10 PK STRL BLUE (TOWEL DISPOSABLE) ×4 IMPLANT
TOWEL OR NON WOVEN STRL DISP B (DISPOSABLE) ×2 IMPLANT
TRAY FOLEY CATH 14FRSI W/METER (CATHETERS) ×2 IMPLANT
WATER STERILE IRR 1500ML POUR (IV SOLUTION) ×4 IMPLANT

## 2012-01-14 NOTE — Addendum Note (Signed)
Addendum  created 01/14/12 1100 by Gaylan Gerold, MD   Modules edited:Anesthesia Medication Administration

## 2012-01-14 NOTE — Interval H&P Note (Signed)
History and Physical Interval Note:  01/14/2012 8:06 AM  Courtney [name]  has presented today for surgery, with the diagnosis of osteoarthritis left hip  The various methods of treatment have been discussed with the patient and family. After consideration of risks, benefits and other options for treatment, the patient has consented to  Procedure(s) (LRB) with comments: TOTAL HIP ARTHROPLASTY (Left) as a surgical intervention .  The patient's history has been reviewed, patient examined, no change in status, stable for surgery.  I have reviewed the patient's chart and labs.  Questions were answered to the patient's satisfaction.     Loanne Drilling

## 2012-01-14 NOTE — Anesthesia Postprocedure Evaluation (Signed)
Anesthesia Post Note  Patient: Courtney [name]  Procedure(s) Performed: Procedure(s) (LRB): TOTAL HIP ARTHROPLASTY (Left)  Anesthesia type: Spinal  Patient location: PACU  Post pain: Pain level controlled  Post assessment: Post-op Vital signs reviewed  Last Vitals: BP 107/51  Pulse 74  Temp 36 C  Resp 20  SpO2 100%  Post vital signs: Reviewed  Level of consciousness: sedated  Complications: No apparent anesthesia complications

## 2012-01-14 NOTE — H&P (Signed)
Courtney [name]  DOB: [birth year] Married / Language: English / Race: White Female  Date of Admission:  01/14/2012  Chief Complaint:  Left Hip Pain  History of Present Illness The patient is a 61 year old female who comes in today for a preoperative History and Physical. The patient is scheduled for a left total hip arthroplasty to be performed by Dr. Gus Rankin. Aluisio, MD at [facility] on 12/26/2011. The original date of service was 12/26/2011 for this H&P but the computer was not available at the time of visit so the information was entered into the computer at a later date and time. The patient has had severe hip pain and low back pain and left lower extremity pain for several months now. All are getting progressively worse. She has known bone on bone arthritis in hip with a superolateral acetabular cyst. She also has pain radiating down her leg. It goes to the lateral border of her leg and foot. She notes that she has back problems and also recently saw Dr. Shon Baton who felt she should get the hip fixed first. She states she has pain throughout the day as well as at night. It is limiting things she can and cannot do. She has had a right total hip arthroplasty done by Dr. Georgena Spurling about 6 or 7 years ago. She has had marginal results with that. She still has lateral hip pain and occasional groin pain. She states the surgery was not a good experience for her. It took her approximately a year to get better. She said she had multiple transfusions all the way to postop day 5. She feels like she would like to get the hip fixed by a different approach. She had numerous questions today about my surgical technique and how it would differ from what she has had previously. She does have significant low back pain and as stated has radiating pain down both lower extremities. She has seen Dr. Shon Baton for that and once again he wants her hip fixed prior to addressing this. She is now  ready to proceed with her hip surgery. Risks and benefits of the surgery have been discussed with the patient and they elect to proceed with surgery.  There are no active contraindications to upcoming procedure such as ongoing infection or progressive neurological disease.   Problem List/Past Medical Osteoarthritis, hip (715.35)   Allergies Zithromax Tri-Pak *MACROLIDES* PENICILLIN. 06/18/2005 CECLOR. 06/18/2005 VICODIN. 07/15/2005 Itching. Morphine Derivatives. Intolerance which causes sickness   Family History Cancer. grandmother fathers side and grandfather fathers side Heart disease in female family member before age 48 Congestive Heart Failure. mother Cerebrovascular Accident. mother Heart Disease. mother, sister and grandmother mothers side Osteoporosis. mother, sister and grandmother mothers side Osteoarthritis. mother, sister and grandmother mothers side   Social History Tobacco / smoke exposure. no Illicit drug use. no Children. 2 Alcohol use. current drinker; drinks wine; only occasionally per week Exercise. Exercises daily; does running / walking and gym / weights Drug/Alcohol Rehab (Previously). no Living situation. live with spouse Drug/Alcohol Rehab (Currently). no Marital status. married Tobacco use. never smoker Pain Contract. no Current work status. working part time Number of flights of stairs before winded. greater than 5 Advance Directives. Living Will   Medication History Amphetamine-Dextroamphetamine (10MG  Tablet, Oral) Active. Venlafaxine HCl (37.5MG  Capsule ER 24HR, Oral) Active. Robaxin (500MG  Tablet, 1 (one) Oral three times daily as needed for spasm, Taken starting 05/01/2011) Active. Hydrocodone-Acetaminophen (5-325MG  Tablet, Oral) Active. TraMADol HCl (50MG  Tablet, Oral) Active.  Pregnancy / Birth History Pregnant. no   Past Surgical History Inguinal Hernia Repair. Date: 1. open: bilateral Carpal  Tunnel Repair. left Total Hip Replacement. Date: 2002. right Tonsillectomy. Date: 42. Partial Oophorectomy. 1970 Laproscopic Surgery. 54, 1983   Past Medical History Osteoarthritis Vertigo Anxiety Disorder Shingles Bronchitis Pneumonia Phlebitis. 1970's During Hormonal Treatment Menopause Measles. 1950's Mumps. 1950's   Review of Systems General:Not Present- Chills, Fever, Night Sweats, Fatigue, Weight Gain, Weight Loss and Memory Loss. Skin:Not Present- Hives, Itching, Rash, Eczema and Lesions. HEENT:Not Present- Tinnitus, Headache, Double Vision, Visual Loss, Hearing Loss and Dentures. Respiratory:Not Present- Shortness of breath with exertion, Shortness of breath at rest, Allergies, Coughing up blood and Chronic Cough. Cardiovascular:Not Present- Chest Pain, Racing/skipping heartbeats, Difficulty Breathing Lying Down, Murmur, Swelling and Palpitations. Gastrointestinal:Not Present- Bloody Stool, Heartburn, Abdominal Pain, Vomiting, Nausea, Constipation, Diarrhea, Difficulty Swallowing, Jaundice and Loss of appetitie. Female Genitourinary:Not Present- Blood in Urine, Urinary frequency, Weak urinary stream, Discharge, Flank Pain, Incontinence, Painful Urination, Urgency, Urinary Retention and Urinating at Night. Musculoskeletal:Present- Joint Pain. Not Present- Muscle Weakness, Muscle Pain, Joint Swelling, Back Pain, Morning Stiffness and Spasms. Neurological:Not Present- Tremor, Dizziness, Blackout spells, Paralysis, Difficulty with balance and Weakness. Psychiatric:Not Present- Insomnia.   Vitals Weight: 120 lb Height: 61 in Body Surface Area: 1.53 m Body Mass Index: 22.67 kg/m Pulse: 80 (Regular) Resp.: 16 (Unlabored) BP: 110/62 (Sitting, Right Arm, Standard)    Physical Exam The physical exam findings are as follows:   General Mental Status - Alert, cooperative and good historian. General Appearance- Anxious and  Cooperative. Not in acute distress. Orientation- Oriented X3. Build & Nutrition- Well nourished and Well developed.   Head and Neck Head- normocephalic, atraumatic . Neck Global Assessment- supple. no bruit auscultated on the right and no bruit auscultated on the left.   Eye Pupil- Bilateral- Regular and Round. Motion- Bilateral- EOMI.   Chest and Lung Exam Auscultation: Breath sounds:- clear at anterior chest wall and - clear at posterior chest wall. Adventitious sounds:- No Adventitious sounds.   Cardiovascular Auscultation:Rhythm- Regular rate and rhythm. Heart Sounds- S1 WNL and S2 WNL. Murmurs & Other Heart Sounds:Auscultation of the heart reveals - No Murmurs.   Abdomen Palpation/Percussion:Tenderness- Abdomen is non-tender to palpation. Rigidity (guarding)- Abdomen is soft. Auscultation:Auscultation of the abdomen reveals - Bowel sounds normal.   Female Genitourinary Not done, not pertinent to present illness  Musculoskeletal On exam, she is alert and oriented in no apparent distress. Her left hip can be flexed to 95. No internal rotation about 20-30 of external rotation and 20 abduction. She has pain on all rotational movements. She walks with a significantly antalgic getting. She does have slight weakness, L5 on the left side. She has pain free range of motion of the right hip, but is tender over the greater trochanter. '    RADIOGRAPHS: Reviewed. She had an AP of the lumbar which shows the entire pelvis in both hips. Her prosthesis on the right is in good position. No abnormalities. On the left, she has bone on bone superolaterally and flattening of the femoral head as well as a superolateral acetabular cyst.  Assessment & Plan Osteoarthritis, hip (715.35)  Note: Patient is for a Left Total Hip Replacement by Dr. Ollen Gross.  Please note that the patient had excruciating pain following her first hip replacement back in 2002  and is very concerned about her pain control postop. We had a lenghty discussion on the multimodality pain measures that will be used following her surgery. She  verbally stated that she is quite anxious about having the surgery since she had such a bad experience the first time.  PCP - Dr. Trinna Post Plotnikov Gyn - Dr. Vincente Poli  Signed electronically by Roberts Gaudy, PA-C

## 2012-01-14 NOTE — Transfer of Care (Signed)
Immediate Anesthesia Transfer of Care Note  Patient: Courtney [name]  Procedure(s) Performed: Procedure(s) (LRB) with comments: TOTAL HIP ARTHROPLASTY (Left)  Patient Location: PACU  Anesthesia Type: Spinal  Level of Consciousness: awake, sedated and patient cooperative  Airway & Oxygen Therapy: Patient Spontanous Breathing and Patient connected to face mask oxygen  Post-op Assessment: Report given to PACU RN and Post -op Vital signs reviewed and stable  Post vital signs: stable  Complications: No apparent anesthesia complications  T11 spinal level BP low without neosyneprine drip.,  restarted

## 2012-01-14 NOTE — Op Note (Signed)
Pre-operative diagnosis- Osteoarthritis Left hip  Post-operative diagnosis- Osteoarthritis  Left hip  Procedure-  LeftTotal Hip Arthroplasty  Surgeon- Gus Rankin. Sarahy Creedon, MD  Assistant- Leilani Able, PA-C   Anesthesia  Spinal  EBL- 100   Drain Hemovac   Complication- None  Condition-PACU - hemodynamically stable.   Brief Clinical Note-  Courtney [name] is a 61 y.o. female with end stage arthritis of her left hip with progressively worsening pain and dysfunction. Pain occurs with activity and rest including pain at night. She has tried analgesics, protected weight bearing and rest without benefit. Pain is too severe to attempt physical therapy. Radiographs demonstrate bone on bone arthritis with subchondral cyst formation. She presents now for left THA.  Procedure in detail-   The patient is brought into the operating room and placed on the operating table. After successful administration of Spinal  anesthesia, the patient is placed in the  Right lateral decubitus position with the  Left side up and held in place with the hip positioner. The lower extremity is isolated from the perineum with plastic drapes and time-out is performed by the surgical team. The lower extremity is then prepped and draped in the usual sterile fashion. A short posterolateral incision is made with a ten blade through the subcutaneous tissue to the level of the fascia lata which is incised in line with the skin incision. The sciatic nerve is palpated and protected and the short external rotators and capsule are isolated from the femur. The hip is then dislocated and the center of the femoral head is marked. A trial prosthesis is placed such that the trial head corresponds to the center of the patients' native femoral head. The resection level is marked on the femoral neck and the resection is made with an oscillating saw. The femoral head is removed and femoral retractors placed to gain access to the femoral  canal.      The canal finder is passed into the femoral canal and the canal is thoroughly irrigated with sterile saline to remove the fatty contents. Axial reaming is performed to 11.5  mm, proximal reaming to 56F  and the sleeve machined to a small. A 56F small trial sleeve is placed into the proximal femur.      The femur is then retracted anteriorly to gain acetabular exposure. Acetabular retractors are placed and the labrum and osteophytes are removed, Acetabular reaming is performed to 49  mm and a 50  mm Pinnacle acetabular shell is placed in anatomic position with excellent purchase. Additional dome screws were not needed. An apex hole eliminator is placed and the permanent 32 mm neutral + 4 Marathon liner is placed into the acetabular shell.      The trial femur is then placed into the femoral canal. The size is 16 x 11  stem with a 36 + 6  neck and a 32 + 0 head with the neck version matching  the patients' native anteversion. The hip is reduced with excellent stability with full extension and full external rotation, 70 degrees flexion with 40 degrees adduction and 90 degrees internal rotation and 90 degrees of flexion with 70 degrees of internal rotation. The operative leg is placed on top of the non-operative leg and the leg lengths are found to be equal. The trials are then removed and the permanent implant of the same size is impacted into the femoral canal. The  ceramic femoral head of the same size as the trial is placed and the  hip is reduced with the same stability parameters. The operative leg is again placed on top of the non-operative leg and the leg lengths are found to be equal.      The wound is then copiously irrigated with saline solution and the capsule and short external rotators are re-attached to the femur through drill holes with Ethibond suture. The fascia lata is closed over a hemovac drain with #1 vicryl suture and the fascia lata, gluteal muscles and subcutaneous tissues are  injected with Exparel 20ml diluted with saline 50ml. The subcutaneous tissues are closed with #1 and2-0 vicryl and the subcuticular layer closed with running 4-0 Monocryl. The drain is hooked to suction, incision cleaned and dried, and steri-srips and a bulky sterile dressing applied. The limb is placed into a knee immobilizer and the patient is awakened and transported to recovery in stable condition.      Please note that a surgical assistant was a medical necessity for this procedure in order to perform it in a safe and expeditious manner. The assistant was necessary to provide retraction to the vital neurovascular structures and to retract and position the limb to allow for anatomic placement of the prosthetic components.  Gus Rankin Courtney Duignan, MD    01/14/2012, 9:43 AM

## 2012-01-14 NOTE — Addendum Note (Signed)
Addendum  created 01/14/12 1100 by Mickeal Daws R Tyrez Berrios, MD   Modules edited:Anesthesia Medication Administration    

## 2012-01-14 NOTE — Anesthesia Preprocedure Evaluation (Addendum)
Anesthesia Evaluation  Patient identified by MRN, date of birth, ID band Patient awake    Reviewed: Allergy & Precautions, H&P , NPO status , Patient's Chart, lab work & pertinent test results  History of Anesthesia Complications Negative for: history of anesthetic complications  Airway Mallampati: I TM Distance: >3 FB Neck ROM: Full    Dental  (+) Teeth Intact, Caps and Dental Advisory Given   Pulmonary neg pulmonary ROS,  breath sounds clear to auscultation  Pulmonary exam normal       Cardiovascular Rhythm:Regular Rate:Normal     Neuro/Psych PSYCHIATRIC DISORDERS Anxiety Depression negative neurological ROS     GI/Hepatic negative GI ROS, Neg liver ROS,   Endo/Other  negative endocrine ROS  Renal/GU negative Renal ROS     Musculoskeletal negative musculoskeletal ROS (+)   Abdominal (+) - obese,   Peds  Hematology negative hematology ROS (+)   Anesthesia Other Findings   Reproductive/Obstetrics                         Anesthesia Physical Anesthesia Plan  ASA: II  Anesthesia Plan: Spinal   Post-op Pain Management:    Induction:   Airway Management Planned: Simple Face Mask  Additional Equipment:   Intra-op Plan:   Post-operative Plan:   Informed Consent: I have reviewed the patients History and Physical, chart, labs and discussed the procedure including the risks, benefits and alternatives for the proposed anesthesia with the patient or authorized representative who has indicated his/her understanding and acceptance.   Dental advisory given  Plan Discussed with: CRNA and Surgeon  Anesthesia Plan Comments:         Anesthesia Quick Evaluation

## 2012-01-14 NOTE — Anesthesia Procedure Notes (Signed)
Spinal  Patient location during procedure: OR Start time: 01/14/2012 8:34 AM End time: 01/14/2012 8:37 AM Staffing Anesthesiologist: Lewie Loron R Performed by: anesthesiologist  Preanesthetic Checklist Completed: patient identified, site marked, surgical consent, pre-op evaluation, timeout performed, IV checked, risks and benefits discussed and monitors and equipment checked Spinal Block Patient position: sitting Prep: ChloraPrep Patient monitoring: heart rate, continuous pulse ox and blood pressure Location: L3-4 Injection technique: single-shot Needle Needle type: Sprotte  Needle gauge: 24 G Needle length: 9 cm Needle insertion depth: 5 cm Assessment Sensory level: T8 Additional Notes Expiration date of kit checked and confirmed. Patient tolerated procedure well, without complications.

## 2012-01-15 ENCOUNTER — Encounter (HOSPITAL_COMMUNITY): Payer: Self-pay | Admitting: Orthopedic Surgery

## 2012-01-15 LAB — BASIC METABOLIC PANEL
BUN: 6 mg/dL (ref 6–23)
CO2: 32 mEq/L (ref 19–32)
Calcium: 8.1 mg/dL — ABNORMAL LOW (ref 8.4–10.5)
Chloride: 96 mEq/L (ref 96–112)
Creatinine, Ser: 0.62 mg/dL (ref 0.50–1.10)
GFR calc Af Amer: >90 mL/min (ref >90)
GFR calc non Af Amer: >90 mL/min (ref >90)
Glucose, Bld: 114 mg/dL — ABNORMAL HIGH (ref 70–99)
Potassium: 3.9 mEq/L (ref 3.5–5.1)
Sodium: 131 mEq/L — ABNORMAL LOW (ref 135–145)

## 2012-01-15 LAB — CBC
HCT: 28.7 % — ABNORMAL LOW (ref 36.0–46.0)
Hemoglobin: 9.8 g/dL — ABNORMAL LOW (ref 12.0–15.0)
MCH: 32 pg (ref 26.0–34.0)
MCHC: 34.1 g/dL (ref 30.0–36.0)
MCV: 93.8 fL (ref 78.0–100.0)
Platelets: 229 10*3/uL (ref 150–400)
RBC: 3.06 MIL/uL — ABNORMAL LOW (ref 3.87–5.11)
RDW: 12.4 % (ref 11.5–15.5)
WBC: 4.7 10*3/uL (ref 4.0–10.5)

## 2012-01-15 MED ORDER — POLYSACCHARIDE IRON COMPLEX 150 MG PO CAPS
150.0000 mg | ORAL_CAPSULE | Freq: Every day | ORAL | Status: DC
  Administered 2012-01-15 – 2012-01-17 (×3): 150 mg via ORAL
  Filled 2012-01-15 (×3): qty 1

## 2012-01-15 MED ORDER — OXYCODONE HCL 5 MG PO TABS
ORAL_TABLET | ORAL | Status: AC
  Administered 2012-01-16: 5 mg via ORAL
  Filled 2012-01-15: qty 2

## 2012-01-15 MED FILL — Sodium Chloride Inj 0.9%: INTRAMUSCULAR | Qty: 50 | Status: AC

## 2012-01-15 NOTE — Care Management Note (Signed)
    Page 1 of 2   01/16/2012     10:36:55 AM   CARE MANAGEMENT NOTE 01/16/2012  Patient:  [name],[name]   Account Number:  [id number]  Date Initiated:  01/15/2012  Documentation initiated by:  Colleen Can  Subjective/Objective Assessment:   dx osteoarthrtis left hip; total hip replacemnt     Action/Plan:   CM spoke with patient . Pt lives in [place] with husband, Plans are incomplete at this time. Wants to talk over options with her doctor. If she goes home she wants raised tiolet seat and bed if possible. Already has [facility]   Anticipated DC Date:  01/17/2012   Anticipated DC Plan:  HOME W HOME HEALTH SERVICES      DC Planning Services  CM consult      [facility]  HOME HEALTH   Choice offered to / List presented to:  C-1 Patient        HH arranged  HH-2 PT      [facility] agency  [facility]   Status of service:  Completed, signed off Medicare Important Message given?  NO (If response is "NO", the following Medicare IM given date fields will be blank) Date Medicare IM given:   Date Additional Medicare IM given:    Discharge Disposition:  HOME W HOME HEALTH SERVICES  Per UR Regulation:  Reviewed for med. necessity/level of care/duration of stay  If discussed at Long Length of Stay Meetings, dates discussed:    Comments:  01/16/12 Courtney [name] MAHABI RRN,BSN NCM [phone] [address]ENT[address]A Courtney [name](Courtney [name]) AWARE OF HHPT ORDERED,MAY D/C OVER WEEKEND,& SHE WILL EXPLORE W/PATIENT REQUIREMENT OF HOSPITAL BED.IF MD AGREE TO HOSPITAL BED WILL NEED SPECIFIC DOCUMENTATION OF MEDICAL NEED:CONDITION...REQUIRES TO BE POSITIONED IN CERTAIN WAYS NOT FEASIBLE W/A NORMAL BED.HEAD MUST BE ELEVATED AT____ OR WHAT HAPPENS IF NOT ELEVATED.

## 2012-01-15 NOTE — Evaluation (Signed)
Physical Therapy Evaluation Patient Details Name: Courtney [name] MRN: [id number] DOB: [birth year] Today's Date: 01/15/2012 Time: 8295-6213 PT Time Calculation (min): 25 min  PT Assessment / Plan / Recommendation Clinical Impression  Pt s/p L THR.  Pt would benefit from acute PT services in order to improve independence with transfers and ambulation by increasing L LE strength and maintaining hip precautions to prepare for d/c home with spouse.      PT Assessment  Patient needs continued PT services    Follow Up Recommendations  Home health PT    Barriers to Discharge        Equipment Recommendations  3 in 1 bedside comode    Recommendations for Other Services     Frequency 7X/week    Precautions / Restrictions Precautions Precautions: Posterior Hip Precaution Comments: handout given Restrictions LLE Weight Bearing: Weight bearing as tolerated   Pertinent Vitals/Pain 2/10 L hip pain, premedicated, repositioned, ice applied      Mobility  Bed Mobility Bed Mobility: Supine to Sit Supine to Sit: 4: Min assist Details for Bed Mobility Assistance: assist for L LE, verbal cues for technique, increased time Transfers Transfers: Stand to Sit;Sit to Stand Sit to Stand: 4: Min guard;With upper extremity assist;From bed Stand to Sit: 4: Min guard;With upper extremity assist;To chair/3-in-1 Details for Transfer Assistance: verbal cues for safe technique and hip precautions Ambulation/Gait Ambulation/Gait Assistance: 4: Min guard Ambulation Distance (Feet): 40 Feet Assistive device: Rolling walker Ambulation/Gait Assistance Details: verbal cues for sequence and turning within precautions Gait Pattern: Step-to pattern;Antalgic    Exercises     PT Diagnosis: Acute pain;Difficulty walking  PT Problem List: Decreased strength;Decreased activity tolerance;Decreased mobility;Decreased knowledge of use of DME;Decreased knowledge of precautions;Pain;Decreased range of  motion PT Treatment Interventions: DME instruction;Gait training;Stair training;Functional mobility training;Therapeutic activities;Therapeutic exercise;Patient/family education   PT Goals Acute Rehab PT Goals PT Goal Formulation: With patient Time For Goal Achievement: 01/22/12 Potential to Achieve Goals: Good Pt will go Supine/Side to Sit: with supervision PT Goal: Supine/Side to Sit - Progress: Goal set today Pt will go Sit to Supine/Side: with supervision PT Goal: Sit to Supine/Side - Progress: Goal set today Pt will go Sit to Stand: with supervision PT Goal: Sit to Stand - Progress: Goal set today Pt will go Stand to Sit: with supervision PT Goal: Stand to Sit - Progress: Goal set today Pt will Ambulate: 51 - 150 feet;with supervision;with least restrictive assistive device PT Goal: Ambulate - Progress: Goal set today Pt will Go Up / Down Stairs: 3-5 stairs;with least restrictive assistive device;with min assist PT Goal: Up/Down Stairs - Progress: Goal set today  Visit Information  Last PT Received On: 01/15/12 Assistance Needed: +1    Subjective Data  Subjective: My husband is going to pull the twin downstairs.   Prior Functioning  Home Living Lives With: Spouse Type of Home: House Home Access: Stairs to enter Entrance Stairs-Number of Steps: 5 Entrance Stairs-Rails: Right Home Layout: 1/2 bath on main level;Able to live on main level with bedroom/bathroom;Multi-level Home Adaptive Equipment: Walker - rolling Prior Function Level of Independence: Independent Communication Communication: No difficulties    Cognition  Overall Cognitive Status: Appears within functional limits for tasks assessed/performed Arousal/Alertness: Awake/alert Orientation Level: Appears intact for tasks assessed Behavior During Session: [facility] for tasks performed    Extremity/Trunk Assessment Right Upper Extremity Assessment RUE ROM/Strength/Tone: [facility] for tasks assessed Left Upper Extremity  Assessment LUE ROM/Strength/Tone: [facility] for tasks assessed Right Lower Extremity Assessment RLE ROM/Strength/Tone: [facility]  for tasks assessed Left Lower Extremity Assessment LLE ROM/Strength/Tone: Deficits;Due to precautions;Unable to fully assess LLE ROM/Strength/Tone Deficits: weak hip musculature with functional observation   Balance    End of Session PT - End of Session Equipment Utilized During Treatment: Gait belt Activity Tolerance: Patient tolerated treatment well Patient left: in chair;with call bell/phone within reach  GP     Robert Sunga,KATHrine E 01/15/2012, 10:28 AM Pager: [phone]

## 2012-01-15 NOTE — Progress Notes (Signed)
Physical Therapy Treatment Note   01/15/12 1300  PT Visit Information  Last PT Received On 01/15/12  Assistance Needed +1  PT Time Calculation  PT Start Time 1143  PT Stop Time 1220  PT Time Calculation (min) 37 min  Subjective Data  Subjective I got nauseated going from the chair back to bed earlier.  Precautions  Precautions Posterior Hip  Precaution Comments pt able to recall 2/3 precautions  Restrictions  LLE Weight Bearing WBAT  Cognition  Overall Cognitive Status Appears within functional limits for tasks assessed/performed  Bed Mobility  Bed Mobility Supine to Sit;Sit to Supine  Supine to Sit 4: Min guard;HOB flat  Sit to Supine 4: Min assist;HOB flat  Details for Bed Mobility Assistance increased time for supine to sit as pt encouraged to attempt without assist and educated in using sheet to assist L LE, provided physical assist for L LE onto bed due to nausea after mobility  Transfers  Transfers Stand to Sit;Sit to Stand  Sit to Stand 3: Mod assist;With upper extremity assist;From bed  Stand to Sit 4: Min assist;With upper extremity assist;To bed  Details for Transfer Assistance verbal cues for safe technique and hip precautions, more assist required due to low bed surface as pt will have low surface at home  Ambulation/Gait  Ambulation/Gait Assistance 4: Min assist  Ambulation Distance (Feet) 16 Feet  Assistive device Rolling walker  Ambulation/Gait Assistance Details verbal cues for sequence and technique, turning toward R LE, limited distance due to nausea and pain  Gait Pattern Step-to pattern;Antalgic  Exercises  Exercises Total Joint  Total Joint Exercises  Ankle Circles/Pumps AROM;Both;20 reps  Quad Sets AROM;Both;20 reps  Short Arc Quad AAROM;Strengthening;Left;15 reps  Heel Slides AROM;Strengthening;Left;15 reps (within precautions)  Hip ABduction/ADduction AAROM;Strengthening;Left;15 reps  PT - End of Session  Equipment Utilized During Treatment Gait  belt  Activity Tolerance Patient limited by fatigue;Patient limited by pain  Patient left in bed;with call bell/phone within reach  Nurse Communication Patient requests pain meds  PT - Assessment/Plan  Comments on Treatment Session Pt performed exercises and short distance ambulation.  Pt with limited ambulation due to nausea with mobility.  PT Plan Discharge plan remains appropriate;Frequency remains appropriate  Follow Up Recommendations Home health PT  Equipment Recommended 3 in 1 bedside comode  Acute Rehab PT Goals  PT Goal: Supine/Side to Sit - Progress Progressing toward goal  PT Goal: Sit to Supine/Side - Progress Progressing toward goal  PT Goal: Sit to Stand - Progress Progressing toward goal  PT Goal: Stand to Sit - Progress Progressing toward goal  PT Goal: Ambulate - Progress Progressing toward goal  PT General Charges  $$ ACUTE PT VISIT 1 Procedure  PT Treatments  $Gait Training 8-22 mins  $Therapeutic Exercise 8-22 mins    Zenovia Jarred, PT Pager: [phone]

## 2012-01-15 NOTE — Progress Notes (Signed)
   Subjective: 1 Day Post-Op Procedure(s) (LRB): TOTAL HIP ARTHROPLASTY (Left) Patient reports pain as mild.   Patient seen in rounds with Dr. Lequita Halt. Patient is well, and has had no acute complaints or problems We will start therapy today.  Plan is to go Home after hospital stay.  Objective: Vital signs in last 24 hours: Temp:  [96.8 F (36 C)-98.7 F (37.1 C)] 98.7 F (37.1 C) (09/19 0515) Pulse Rate:  [61-76] 75  (09/19 0515) Resp:  [13-19] 16  (09/19 0515) BP: (93-114)/(55-70) 94/56 mmHg (09/19 0515) SpO2:  [95 %-100 %] 95 % (09/19 0515) Weight:  [54.432 kg (120 lb)] 54.432 kg (120 lb) (09/18 1145)  Intake/Output from previous day:  Intake/Output Summary (Last 24 hours) at 01/15/12 0752 Last data filed at 01/15/12 0600  Gross per 24 hour  Intake   4845 ml  Output   4257 ml  Net    588 ml    Intake/Output this shift:    Labs:  Basename 01/15/12 0435  HGB 9.8*    Basename 01/15/12 0435  WBC 4.7  RBC 3.06*  HCT 28.7*  PLT 229    Basename 01/15/12 0435  NA 131*  K 3.9  CL 96  CO2 32  BUN 6  CREATININE 0.62  GLUCOSE 114*  CALCIUM 8.1*   No results found for this basename: LABPT:2,INR:2 in the last 72 hours  EXAM General - Patient is Alert, Appropriate and Oriented Extremity - Neurovascular intact Sensation intact distally Dorsiflexion/Plantar flexion intact Dressing - dressing C/D/I Motor Function - intact, moving foot and toes well on exam.  Hemovac pulled without difficulty.  Past Medical History  Diagnosis Date  . IBS (irritable bowel syndrome)   . ADD (attention deficit disorder)   . Shoulder pain, right   . Hyperlipidemia   . Osteoarthritis   . Osteoporosis   . Depression     no bipolar per Dr. Nolen Mu  . Celiac disease     possible vs IBS  . GI problem     [name]  . Anxiety   . Seasonal allergies 01-08-12    hx. of multiple bronchitis related to this.    Assessment/Plan: 1 Day Post-Op Procedure(s) (LRB): TOTAL HIP  ARTHROPLASTY (Left) Principal Problem:  *OA (osteoarthritis) of hip   Advance diet Up with therapy Plan for discharge tomorrow Discharge home with home health  DVT Prophylaxis - Xarelto Weight Bearing As Tolerated left Leg D/C Knee Immobilizer Hemovac Pulled Begin Therapy Hip Preacutions No vaccines.  PERKINS, ALEXZANDREW 01/15/2012, 7:52 AM

## 2012-01-15 NOTE — Progress Notes (Signed)
Utilization review completed.  

## 2012-01-16 LAB — CBC
HCT: 26.6 % — ABNORMAL LOW (ref 36.0–46.0)
Hemoglobin: 8.9 g/dL — ABNORMAL LOW (ref 12.0–15.0)
MCH: 31.3 pg (ref 26.0–34.0)
MCHC: 33.5 g/dL (ref 30.0–36.0)
MCV: 93.7 fL (ref 78.0–100.0)
Platelets: 227 10*3/uL (ref 150–400)
RBC: 2.84 MIL/uL — ABNORMAL LOW (ref 3.87–5.11)
RDW: 12.4 % (ref 11.5–15.5)
WBC: 6 10*3/uL (ref 4.0–10.5)

## 2012-01-16 LAB — BASIC METABOLIC PANEL
BUN: 6 mg/dL (ref 6–23)
CO2: 32 mEq/L (ref 19–32)
Calcium: 8.3 mg/dL — ABNORMAL LOW (ref 8.4–10.5)
Chloride: 97 mEq/L (ref 96–112)
Creatinine, Ser: 0.72 mg/dL (ref 0.50–1.10)
GFR calc Af Amer: >90 mL/min (ref >90)
GFR calc non Af Amer: 89 mL/min — ABNORMAL LOW (ref >90)
Glucose, Bld: 116 mg/dL — ABNORMAL HIGH (ref 70–99)
Potassium: 4.4 mEq/L (ref 3.5–5.1)
Sodium: 132 mEq/L — ABNORMAL LOW (ref 135–145)

## 2012-01-16 MED ORDER — RIVAROXABAN 10 MG PO TABS: 10.0000 mg | ORAL_TABLET | Freq: Every day | ORAL | Status: DC

## 2012-01-16 MED ORDER — METHOCARBAMOL 500 MG PO TABS: 500.0000 mg | ORAL_TABLET | Freq: Four times a day (QID) | ORAL | Status: DC | PRN

## 2012-01-16 MED ORDER — SODIUM CHLORIDE 0.9 % IV SOLN
Freq: Once | INTRAVENOUS | Status: AC
  Administered 2012-01-16: 250 mL/h via INTRAVENOUS

## 2012-01-16 MED ORDER — ONDANSETRON HCL 4 MG PO TABS: 4.0000 mg | ORAL_TABLET | Freq: Four times a day (QID) | ORAL | Status: DC | PRN

## 2012-01-16 MED ORDER — POLYETHYLENE GLYCOL 3350 17 G PO PACK
17.0000 g | PACK | Freq: Every day | ORAL | Status: DC
  Administered 2012-01-16 – 2012-01-17 (×2): 17 g via ORAL

## 2012-01-16 MED ORDER — SODIUM CHLORIDE 0.9 % IV SOLN
INTRAVENOUS | Status: DC
  Administered 2012-01-16: 125 mL/h via INTRAVENOUS

## 2012-01-16 MED ORDER — POLYSACCHARIDE IRON COMPLEX 150 MG PO CAPS: 150.0000 mg | ORAL_CAPSULE | Freq: Two times a day (BID) | ORAL | Status: DC

## 2012-01-16 MED ORDER — OXYCODONE HCL 5 MG PO TABS: 10.0000 mg | ORAL_TABLET | ORAL | Status: DC | PRN

## 2012-01-16 NOTE — Discharge Summary (Signed)
Physician Discharge Summary   Patient ID: Courtney [name] MRN: [id number] DOB/AGE: [birth year] 61 y.o.  Admit date: 01/14/2012 Discharge date: 01/17/2012  Primary Diagnosis: Osteoarthritis Left hip   Admission Diagnoses:  Past Medical History  Diagnosis Date  . IBS (irritable bowel syndrome)   . ADD (attention deficit disorder)   . Shoulder pain, right   . Hyperlipidemia   . Osteoarthritis   . Osteoporosis   . Depression     no bipolar per Dr. Nolen Mu  . Celiac disease     possible vs IBS  . GI problem     [name]  . Anxiety   . Seasonal allergies 01-08-12    hx. of multiple bronchitis related to this.   Discharge Diagnoses:   Principal Problem:  *OA (osteoarthritis) of hip  Procedure: Procedure(s) (LRB): TOTAL HIP ARTHROPLASTY (Left)   Consults: None  HPI: Courtney [name] is a 61 y.o. female with end stage arthritis of her left hip with progressively worsening pain and dysfunction. Pain occurs with activity and rest including pain at night. She has tried analgesics, protected weight bearing and rest without benefit. Pain is too severe to attempt physical therapy. Radiographs demonstrate bone on bone arthritis with subchondral cyst formation. She presents now for left THA.   Laboratory Data: Hospital Outpatient Visit on 01/08/2012  Component Date Value Range Status  . MRSA, PCR 01/08/2012 NEGATIVE  NEGATIVE Final  . Staphylococcus aureus 01/08/2012 POSITIVE* NEGATIVE Final   Comment:                                 The Xpert SA Assay (FDA                          approved for NASAL specimens                          in patients over 65 years of age),                          is one component of                          a comprehensive surveillance                          program.  Test performance has                          been validated by [employer] for patients greater                          than or equal to 1 year  old.                          It is not intended                          to diagnose infection nor to  guide or monitor treatment.  [name] aPTT 01/08/2012 35  24 - 37 seconds Final  . WBC 01/08/2012 5.8  4.0 - 10.5 K/uL Final  . RBC 01/08/2012 4.56  3.87 - 5.11 MIL/uL Final  . Hemoglobin 01/08/2012 14.2  12.0 - 15.0 g/dL Final  . HCT 96/07/5407 42.6  36.0 - 46.0 % Final  . MCV 01/08/2012 93.4  78.0 - 100.0 fL Final  . MCH 01/08/2012 31.1  26.0 - 34.0 pg Final  . MCHC 01/08/2012 33.3  30.0 - 36.0 g/dL Final  . RDW 81/19/1478 12.5  11.5 - 15.5 % Final  . Platelets 01/08/2012 369  150 - 400 K/uL Final  . Sodium 01/08/2012 135  135 - 145 mEq/L Final  . Potassium 01/08/2012 4.2  3.5 - 5.1 mEq/L Final  . Chloride 01/08/2012 96  96 - 112 mEq/L Final  . CO2 01/08/2012 31  19 - 32 mEq/L Final  . Glucose, Bld 01/08/2012 88  70 - 99 mg/dL Final  . BUN 29/56/2130 11  6 - 23 mg/dL Final  . Creatinine, Ser 01/08/2012 0.70  0.50 - 1.10 mg/dL Final  . Calcium 86/57/8469 9.9  8.4 - 10.5 mg/dL Final  . Total Protein 01/08/2012 7.2  6.0 - 8.3 g/dL Final  . Albumin 62/95/2841 4.3  3.5 - 5.2 g/dL Final  . AST 32/44/0102 18  0 - 37 U/L Final  . ALT 01/08/2012 12  0 - 35 U/L Final  . Alkaline Phosphatase 01/08/2012 58  39 - 117 U/L Final  . Total Bilirubin 01/08/2012 0.4  0.3 - 1.2 mg/dL Final  . GFR calc non Af Amer 01/08/2012 >90  >90 mL/min Final  . GFR calc Af Amer 01/08/2012 >90  >90 mL/min Final   Comment:                                 The eGFR has been calculated                          using the CKD EPI equation.                          This calculation has not been                          validated in all clinical                          situations.                          eGFR's persistently                          <90 mL/min signify                          possible Chronic Kidney Disease.  [name] Prothrombin Time 01/08/2012 13.0  11.6 - 15.2 seconds Final  . INR  01/08/2012 0.96  0.00 - 1.49 Final  . Color, Urine 01/08/2012 YELLOW  YELLOW Final  . APPearance 01/08/2012 CLEAR  CLEAR Final  . Specific Gravity, Urine 01/08/2012 1.013  1.005 - 1.030 Final  . pH 01/08/2012 8.0  5.0 - 8.0 Final  .  Glucose, UA 01/08/2012 NEGATIVE  NEGATIVE mg/dL Final  . Hgb urine dipstick 01/08/2012 NEGATIVE  NEGATIVE Final  . Bilirubin Urine 01/08/2012 NEGATIVE  NEGATIVE Final  . Ketones, ur 01/08/2012 NEGATIVE  NEGATIVE mg/dL Final  . Protein, ur 16/01/9603 NEGATIVE  NEGATIVE mg/dL Final  . Urobilinogen, UA 01/08/2012 0.2  0.0 - 1.0 mg/dL Final  . Nitrite 54/12/8117 NEGATIVE  NEGATIVE Final  . Leukocytes, UA 01/08/2012 NEGATIVE  NEGATIVE Final   MICROSCOPIC NOT DONE ON URINES WITH NEGATIVE PROTEIN, BLOOD, LEUKOCYTES, NITRITE, OR GLUCOSE <1000 mg/dL.    Basename 01/16/12 0410 01/15/12 0435  HGB 8.9* 9.8*    Basename 01/16/12 0410 01/15/12 0435  WBC 6.0 4.7  RBC 2.84* 3.06*  HCT 26.6* 28.7*  PLT 227 229    Basename 01/16/12 0410 01/15/12 0435  NA 132* 131*  K 4.4 3.9  CL 97 96  CO2 32 32  BUN 6 6  CREATININE 0.72 0.62  GLUCOSE 116* 114*  CALCIUM 8.3* 8.1*   No results found for this basename: LABPT:2,INR:2 in the last 72 hours  X-Rays:Dg Hip Complete Left  01/08/2012  *RADIOLOGY REPORT*  Clinical Data: Preop  LEFT HIP - COMPLETE 2+ VIEW  Comparison: 06/05/2010  Findings: Severe degenerative change of the left hip joint with bone on bone and juxta-articular cystic change with sclerosis superiorly.  Right total arthroplasty anatomically aligned.  No acute fracture.  No breakage or loosening of the hardware.  Severe degenerative change in the lower lumbar spine with dextroscoliosis. Mild degenerative change of the SI joints.  IMPRESSION: Degenerative change.  No acute bony pathology.   Original Report Authenticated By: Donavan Burnet, M.D.    Dg Pelvis Portable  01/14/2012  *RADIOLOGY REPORT*  Clinical Data: Left total hip arthroplasty.  PORTABLE PELVIS   Comparison: Preoperative films 01/08/2012.  Findings: The femoral and acetabular components are well seated. No complicating features.  The pubic symphysis and pubic rami are intact.  IMPRESSION: Well seated components of a total left hip arthroplasty.  No complicating features.   Original Report Authenticated By: P. Loralie Champagne, M.D.     EKG: Orders placed in visit on 06/05/10  . CONVERTED CEMR EKG     Hospital Course: Patient was admitted to [facility] and taken to the OR and underwent the above state procedure without complications.  Patient tolerated the procedure well and was later transferred to the recovery room and then to the orthopaedic floor for postoperative care.  They were given PO and IV analgesics for pain control following their surgery.  They were given 24 hours of postoperative antibiotics and started on DVT prophylaxis in the form of Xarelto.   PT and OT were ordered for total hip protocol.  The patient was allowed to be WBAT with therapy. Discharge planning was consulted to help with postop disposition and equipment needs.  Patient had a decent night on the evening of surgery and started to get up OOB with therapy on day one.  Hemovac drain was pulled without difficulty.  The knee immobilizer was removed and discontinued.  On day two, she got nauseated when she got up this morning. Her pressure was a little soft this morning. Gave fluids and rechecked pressures. It's probably the pain medications but will monitor. Continued to work with therapy into day two.  Dressing was changed on day two and the incision was healing well. Sensation was intact distally but did complain of some numbness on the top of her foot. She did have sensation  to light touch but was reduced when compared to the other foot. She also has good dorsiflexion and good strength in the foot.  By day three, the patient had progressed with therapy and meeting their goals. Patient was having problems with  numbness on dorsum of foot present since surgery but the incision was healing well.  Patient was seen in rounds and was ready to go home after therapy.   Discharge Medications: Prior to Admission medications   Medication Sig Start Date End Date Taking? Authorizing Provider  amphetamine-dextroamphetamine (ADDERALL) 10 MG tablet Take 10 mg by mouth 2 (two) times daily.    Yes Historical Provider, MD  venlafaxine XR (EFFEXOR-XR) 37.5 MG 24 hr capsule Take 37.5 mg by mouth daily with breakfast.   Yes Historical Provider, MD  iron polysaccharides (NIFEREX) 150 MG capsule Take 1 capsule (150 mg total) by mouth 2 (two) times daily. 01/16/12   Alexzandrew Julien Girt, PA  methocarbamol (ROBAXIN) 500 MG tablet Take 1 tablet (500 mg total) by mouth every 6 (six) hours as needed. 01/16/12   Alexzandrew Perkins, PA  ondansetron (ZOFRAN) 4 MG tablet Take 1 tablet (4 mg total) by mouth every 6 (six) hours as needed for nausea. 01/16/12   Alexzandrew Perkins, PA  oxyCODONE (OXY IR/ROXICODONE) 5 MG immediate release tablet Take 2-3 tablets (10-15 mg total) by mouth every 4 (four) hours as needed for pain. 01/16/12   Alexzandrew Julien Girt, PA  rivaroxaban (XARELTO) 10 MG TABS tablet Take 1 tablet (10 mg total) by mouth daily with breakfast. Take Xarelto for two and a half more weeks, then discontinue Xarelto. 01/16/12   Alexzandrew Julien Girt, PA    Diet: Cardiac diet Activity:WBAT No bending hip over 90 degrees- A "L" Angle Do not cross legs Do not let foot roll inward When turning these patients a pillow should be placed between the patient's legs to prevent crossing. Patients should have the affected knee fully extended when trying to sit or stand from all surfaces to prevent excessive hip flexion. When ambulating and turning toward the affected side the affected leg should have the toes turned out prior to moving the walker and the rest of patient's body as to prevent internal rotation/ turning in of the leg. Abduction  pillows are the most effective way to prevent a patient from not crossing legs or turning toes in at rest. If an abduction pillow is not ordered placing a regular pillow length wise between the patient's legs is also an effective reminder. It is imperative that these precautions be maintained so that the surgical hip does not dislocate. Follow-up:in 2 weeks Disposition - Home Discharged Condition: Improved   Discharge Orders    Future Orders Please Complete By Expires   Diet general      Call MD / Call 911      Comments:   If you experience chest pain or shortness of breath, CALL 911 and be transported to the hospital emergency room.  If you develope a fever above 101 F, pus (white drainage) or increased drainage or redness at the wound, or calf pain, call your surgeon's office.   Discharge instructions      Comments:   Pick up stool softner and laxative for home. Do not submerge incision under water. May shower. Continue to use ice for pain and swelling from surgery. Hip precautions.  Total Hip Protocol.  Take Xarelto for two and a half more weeks, then discontinue Xarelto.   Constipation Prevention      Comments:  Drink plenty of fluids.  Prune juice may be helpful.  You may use a stool softener, such as Colace (over the counter) 100 mg twice a day.  Use MiraLax (over the counter) for constipation as needed.   Increase activity slowly as tolerated      Patient may shower      Comments:   You may shower without a dressing once there is no drainage.  Do not wash over the wound.  If drainage remains, do not shower until drainage stops.   Driving restrictions      Comments:   No driving until released by the physician.   Lifting restrictions      Comments:   No lifting until released by the physician.   Follow the hip precautions as taught in Physical Therapy      Change dressing      Comments:   You may change your dressing dressing daily with sterile 4 x 4 inch gauze dressing  and paper tape.  Do not submerge the incision under water.   TED hose      Comments:   Use stockings (TED hose) for 3 weeks on both leg(s).  You may remove them at night for sleeping.   Do not sit on low chairs, stoools or toilet seats, as it may be difficult to get up from low surfaces          Medication List     As of 01/16/2012  9:27 AM    STOP taking these medications         calcium carbonate 600 MG Tabs   Commonly known as: OS-CAL      cholecalciferol 1000 UNITS tablet   Commonly known as: VITAMIN D      fish oil-omega-3 fatty acids 1000 MG capsule      HYDROcodone-acetaminophen 5-325 MG per tablet   Commonly known as: NORCO/VICODIN      TAKE these medications         amphetamine-dextroamphetamine 10 MG tablet   Commonly known as: ADDERALL   Take 10 mg by mouth 2 (two) times daily.      iron polysaccharides 150 MG capsule   Commonly known as: NIFEREX   Take 1 capsule (150 mg total) by mouth 2 (two) times daily.      methocarbamol 500 MG tablet   Commonly known as: ROBAXIN   Take 1 tablet (500 mg total) by mouth every 6 (six) hours as needed.      ondansetron 4 MG tablet   Commonly known as: ZOFRAN   Take 1 tablet (4 mg total) by mouth every 6 (six) hours as needed for nausea.      oxyCODONE 5 MG immediate release tablet   Commonly known as: Oxy IR/ROXICODONE   Take 2-3 tablets (10-15 mg total) by mouth every 4 (four) hours as needed for pain.      rivaroxaban 10 MG Tabs tablet   Commonly known as: XARELTO   Take 1 tablet (10 mg total) by mouth daily with breakfast. Take Xarelto for two and a half more weeks, then discontinue Xarelto.      venlafaxine XR 37.5 MG 24 hr capsule   Commonly known as: EFFEXOR-XR   Take 37.5 mg by mouth daily with breakfast.           Follow-up Information    Follow up with Loanne Drilling, MD. Schedule an appointment as soon as possible for a visit in 2 weeks.   Contact information:  [facility] [address], [address] [place] [address] [phone]          Signed: Patrica Duel 01/16/2012, 9:27 AM

## 2012-01-16 NOTE — Progress Notes (Signed)
Physical Therapy Treatment Note   01/16/12 1619  PT Visit Information  Last PT Received On 01/16/12  Assistance Needed +1  PT Time Calculation  PT Start Time 1424  PT Stop Time 1509  PT Time Calculation (min) 45 min  Subjective Data  Subjective I was trying to take a nap.  Precautions  Precautions Posterior Hip  Restrictions  LLE Weight Bearing WBAT  Cognition  Overall Cognitive Status Appears within functional limits for tasks assessed/performed  Bed Mobility  Bed Mobility Supine to Sit;Sit to Supine  Supine to Sit 4: Min assist;HOB flat;With rails  Sit to Supine 4: Min assist;HOB flat  Details for Bed Mobility Assistance educated in using sheet however pt having increased pain so provided assist to support L LE  Transfers  Transfers Stand to Sit;Sit to Stand  Sit to Stand 4: Min guard;From bed;With upper extremity assist;From chair/3-in-1  Stand to Sit 4: Min guard;To bed;With upper extremity assist;To chair/3-in-1  Details for Transfer Assistance verbal cues for safe technique  Ambulation/Gait  Ambulation/Gait Assistance 4: Min guard  Ambulation Distance (Feet) 40 Feet  Assistive device Rolling walker  Ambulation/Gait Assistance Details continue to require cues for decreasing internal rotation, verbal cue for increased use of upper body to assist with pain  Gait Pattern Step-to pattern;Antalgic  Gait velocity decreased  Total Joint Exercises  Ankle Circles/Pumps AROM;Both;20 reps  Quad Sets AROM;Both;20 reps  Gluteal Sets AROM;Both;20 reps  Short Arc Quad Strengthening;Left;AROM;20 reps  Heel Slides AROM;Strengthening;Left;20 reps  Hip ABduction/ADduction AAROM;Strengthening;Left;20 reps  PT - End of Session  Activity Tolerance Patient tolerated treatment well  Patient left in bed;with call bell/phone within reach (with OT)  PT - Assessment/Plan  Comments on Treatment Session Pt agreeable to SNF as husband will be at work during the day and she has no caregiver able  to assist her at home. Pt performed exercises, ambulated, assisted to bathroom, and then assisted back to back. OT in to work with pt upon leaving room.  PT Plan Discharge plan needs to be updated;Frequency remains appropriate  Follow Up Recommendations Skilled nursing facility  Equipment Recommended 3 in 1 bedside comode  Acute Rehab PT Goals  PT Goal: Supine/Side to Sit - Progress Progressing toward goal  PT Goal: Sit to Supine/Side - Progress Progressing toward goal  PT Goal: Sit to Stand - Progress Progressing toward goal  PT Goal: Stand to Sit - Progress Progressing toward goal  PT Goal: Ambulate - Progress Progressing toward goal  PT General Charges  $$ ACUTE PT VISIT 1 Procedure  PT Treatments  $Gait Training 8-22 mins  $Therapeutic Exercise 23-37 mins    Zenovia Jarred, PT Pager: [phone]

## 2012-01-16 NOTE — Progress Notes (Signed)
Physical Therapy Treatment Patient Details Name: Courtney [name] MRN: [id number] DOB: 1951-01-25 Today's Date: 01/16/2012 Time: 4098-1191 PT Time Calculation (min): 39 min  PT Assessment / Plan / Recommendation Comments on Treatment Session  Orthostatics: supine 92/48 mmHg HR 87, sitting 106/68 mmHg HR 98, and standing 92/40 mmHg.  BP then increased to 103/53 mmHg and HR 88 during ambulation.  Pt reported dizziness throughout session which stayed consistent.  Pt ambulated in hallway with increased verbal cues for precautions.    Follow Up Recommendations  Home health PT    Barriers to Discharge        Equipment Recommendations  3 in 1 bedside comode    Recommendations for Other Services    Frequency     Plan Discharge plan remains appropriate;Frequency remains appropriate    Precautions / Restrictions Precautions Precautions: Posterior Hip Precaution Comments: reviewed hip precaution with mobility Restrictions Weight Bearing Restrictions: No LLE Weight Bearing: Weight bearing as tolerated   Pertinent Vitals/Pain 5/10 L hip pain with mobility    Mobility  Bed Mobility Bed Mobility: Supine to Sit Rolling Right: 4: Min assist (with pillow between legs and use of trapeze) Supine to Sit: 4: Min assist Details for Bed Mobility Assistance: verbal cues for precautions, assist for L LE Transfers Transfers: Stand to Sit;Sit to Stand Sit to Stand: 4: Min assist;From bed;With upper extremity assist Stand to Sit: 4: Min assist;With upper extremity assist;To chair/3-in-1 Details for Transfer Assistance: verbal cues for safe technique, pt also assisted to bathroom with increased verbal cues for safe transfers  Ambulation/Gait Ambulation/Gait Assistance: 4: Min guard Ambulation Distance (Feet): 80 Feet Assistive device: Rolling walker Ambulation/Gait Assistance Details: increased time, visual cue on floor to maintain neutral hip (not internally rotate) also verbal cues for safe  RW use and sequence Gait Pattern: Step-to pattern;Antalgic Gait velocity: decreased    Exercises     PT Diagnosis:    PT Problem List:   PT Treatment Interventions:     PT Goals Acute Rehab PT Goals PT Goal: Supine/Side to Sit - Progress: Progressing toward goal PT Goal: Sit to Stand - Progress: Progressing toward goal PT Goal: Stand to Sit - Progress: Progressing toward goal PT Goal: Ambulate - Progress: Progressing toward goal  Visit Information  Last PT Received On: 01/16/12 Assistance Needed: +1    Subjective Data  Subjective: I'm really dizzy today.   Cognition  Overall Cognitive Status: Appears within functional limits for tasks assessed/performed Arousal/Alertness: Awake/alert Orientation Level: Appears intact for tasks assessed Behavior During Session: Anxious    Balance     End of Session PT - End of Session Activity Tolerance: Patient limited by fatigue;Patient limited by pain Patient left: in chair;with call bell/phone within reach Nurse Communication: Other (comment) (orthostatics)   GP     Quinita Kostelecky,KATHrine E 01/16/2012, 12:59 PM Pager: [phone]

## 2012-01-16 NOTE — Progress Notes (Signed)
   Subjective: 2 Days Post-Op Procedure(s) (LRB): TOTAL HIP ARTHROPLASTY (Left) Patient reports pain as mild and moderate.  She did a fair amount of walking yesterday.  She also got nauseated when she got up this morning.  Her pressure was a little soft this morning.  Will give fluids and recheck pressures today.  It's probably the pain medications but will monitor. Patient seen in rounds by Dr. Lequita Halt. Patient is well, but has had some minor complaints of nausea.  Give fluids and antiemetis and monitor for further symptoms. Plan is to go Home after hospital stay.  Objective: Vital signs in last 24 hours: Temp:  [97.5 F (36.4 C)-99.4 F (37.4 C)] 99.4 F (37.4 C) (09/20 0548) Pulse Rate:  [73-93] 93  (09/20 0548) Resp:  [14-16] 14  (09/20 0548) BP: (86-110)/(49-69) 86/49 mmHg (09/20 0548) SpO2:  [95 %-97 %] 95 % (09/20 0548)  Intake/Output from previous day:  Intake/Output Summary (Last 24 hours) at 01/16/12 0919 Last data filed at 01/16/12 0900  Gross per 24 hour  Intake 1564.58 ml  Output   1550 ml  Net  14.58 ml    Intake/Output this shift: Total I/O In: 240 [P.O.:240] Out: 400 [Urine:400]  Labs:  [facility] 01/16/12 0410 01/15/12 0435  HGB 8.9* 9.8*    Basename 01/16/12 0410 01/15/12 0435  WBC 6.0 4.7  RBC 2.84* 3.06*  HCT 26.6* 28.7*  PLT 227 229    Basename 01/16/12 0410 01/15/12 0435  NA 132* 131*  K 4.4 3.9  CL 97 96  CO2 32 32  BUN 6 6  CREATININE 0.72 0.62  GLUCOSE 116* 114*  CALCIUM 8.3* 8.1*   No results found for this basename: LABPT:2,INR:2 in the last 72 hours  EXAM General - Patient is Alert, Appropriate and Oriented Extremity - Neurovascular intact Sensation intact distally but does complain of some numbness on the top of her foot.  She does have sensation to light touch but is reduced when compared to the other foot.  She also has good dorsiflexion and good strength in the foot. Dorsiflexion/Plantar flexion intact No cellulitis  present Dressing/Incision - clean, dry, no drainage, healing Motor Function - intact, moving foot and toes well on exam.   Past Medical History  Diagnosis Date  . IBS (irritable bowel syndrome)   . ADD (attention deficit disorder)   . Shoulder pain, right   . Hyperlipidemia   . Osteoarthritis   . Osteoporosis   . Depression     no bipolar per Dr. Nolen Mu  . Celiac disease     possible vs IBS  . GI problem     [name]  . Anxiety   . Seasonal allergies 01-08-12    hx. of multiple bronchitis related to this.    Assessment/Plan: 2 Days Post-Op Procedure(s) (LRB): TOTAL HIP ARTHROPLASTY (Left) Principal Problem:  *OA (osteoarthritis) of hip   Up with therapy Plan for discharge tomorrow Discharge home with home health  DVT Prophylaxis - Xarelto Weight Bearing As Tolerated left Leg  Marlise Fahr 01/16/2012, 9:19 AM

## 2012-01-16 NOTE — Evaluation (Signed)
Occupational Therapy Evaluation Patient Details Name: Courtney [name] MRN: [id number] DOB: 05/01/1950 Today's Date: 01/16/2012 Time: 4696-2952 OT Time Calculation (min): 22 min  OT Assessment / Plan / Recommendation Clinical Impression  This 61 y.o. femal admitted for Lt. THA.  Pt. is very anxious during initial evaluation, and having difficulty getting comfortable.  Pt needs reinforcement for THA precautions.  She has limited assistance at discharge (weekends and pm's only).  Feel she may benefit from short SNF stay for rehab    OT Assessment  Patient needs continued OT Services    Follow Up Recommendations  Skilled nursing facility    Barriers to Discharge Decreased caregiver support    Equipment Recommendations  3 in 1 bedside comode    Recommendations for Other Services    Frequency  Min 2X/week    Precautions / Restrictions Precautions Precautions: Posterior Hip Precaution Comments: Pt requires min verbal cues to adhere to precautions Restrictions Weight Bearing Restrictions: No LLE Weight Bearing: Weight bearing as tolerated       ADL  Eating/Feeding: Simulated;Independent Where Assessed - Eating/Feeding: Bed level Grooming: Simulated;Wash/dry hands;Wash/dry face;Teeth care;Set up Where Assessed - Grooming: Supine, head of bed up Upper Body Bathing: Simulated;Set up Where Assessed - Upper Body Bathing: Supine, head of bed up Lower Body Bathing: Simulated;Moderate assistance Where Assessed - Lower Body Bathing: Supine, head of bed up Upper Body Dressing: Simulated;Supervision/safety Where Assessed - Upper Body Dressing: Supported sitting Lower Body Dressing: Simulated;+1 Total assistance Where Assessed - Lower Body Dressing: Supine, head of bed up ADL Comments: Pt. squirming in bed, partially lying on Rt. side, unable to position self comfortably.  Discussed THA precautions and bed mobility.  Pt. able to roll fully on Rt. with pillow between knees with min A.   Pt. is heavily dependent on trapeze.  And is very anxious with movement.  Pt. reports she was up in recliner earlier and was very uncomfortable, and does not wish to get up right now.  RN reports pt. continues to be hypotensive.  Pt. reports feeling very concerned about discharging home and feels she may need SNF level rehab    OT Diagnosis: Generalized weakness;Acute pain  OT Problem List: Decreased strength;Decreased activity tolerance;Decreased knowledge of use of DME or AE;Decreased knowledge of precautions;Pain OT Treatment Interventions: Self-care/ADL training;DME and/or AE instruction;Therapeutic activities;Patient/family education   OT Goals Acute Rehab OT Goals OT Goal Formulation: With patient Time For Goal Achievement: 01/23/12 Potential to Achieve Goals: Good ADL Goals Pt Will Perform Grooming: with supervision;Standing at sink ADL Goal: Grooming - Progress: Goal set today Pt Will Perform Lower Body Bathing: with supervision;Sit to stand from chair;Sit to stand from bed (with AE) ADL Goal: Lower Body Bathing - Progress: Goal set today Pt Will Perform Lower Body Dressing: with supervision;with adaptive equipment;Sit to stand from chair;Sit to stand from bed ADL Goal: Lower Body Dressing - Progress: Goal set today Pt Will Transfer to Toilet: with supervision;Ambulation;3-in-1 ADL Goal: Toilet Transfer - Progress: Goal set today Pt Will Perform Toileting - Clothing Manipulation: with supervision;Standing ADL Goal: Toileting - Clothing Manipulation - Progress: Goal set today Pt Will Perform Toileting - Hygiene: with supervision;Sit to stand from 3-in-1/toilet ADL Goal: Toileting - Hygiene - Progress: Goal set today  Visit Information  Last OT Received On: 01/16/12 Assistance Needed: +1    Subjective Data  Subjective: "I have no idea what I am going to do" re: home situation  Patient Stated Goal: "To get comfortable"   Prior Functioning  Vision/Perception  Home  Living Lives With: Spouse Available Help at Discharge:  (No assistance during the day - spouse works) Type of Home: House Home Access: Stairs to enter Secretary/administrator of Steps: 5 Entrance Stairs-Rails: Right Home Layout: 1/2 bath on main level;Able to live on main level with bedroom/bathroom;Multi-level Bathroom Toilet: Standard Home Adaptive Equipment: Walker - rolling Additional Comments: Pt very concerned about lack of assistance at discharge and feels she may need SNF Prior Function Level of Independence: Independent Able to Take Stairs?: Yes Driving: Yes Communication Communication: No difficulties Dominant Hand: Right      Cognition  Overall Cognitive Status: Appears within functional limits for tasks assessed/performed Arousal/Alertness: Awake/alert Orientation Level: Appears intact for tasks assessed Behavior During Session: Anxious    Extremity/Trunk Assessment Right Upper Extremity Assessment RUE ROM/Strength/Tone: WFL for tasks assessed RUE Coordination: WFL - gross/fine motor Left Upper Extremity Assessment LUE ROM/Strength/Tone: WFL for tasks assessed LUE Coordination: WFL - gross/fine motor   Mobility  Shoulder Instructions  Bed Mobility Bed Mobility: Rolling Right Rolling Right: 4: Min assist (with pillow between legs and use of trapeze)       Exercise     Balance     End of Session OT - End of Session Activity Tolerance: Patient limited by pain Patient left: in bed;with call bell/phone within reach;with family/visitor present  GO     Trevon Strothers, Ursula Alert M 01/16/2012, 12:54 PM

## 2012-01-17 LAB — CBC
HCT: 25.2 % — ABNORMAL LOW (ref 36.0–46.0)
Hemoglobin: 8.4 g/dL — ABNORMAL LOW (ref 12.0–15.0)
MCH: 31.3 pg (ref 26.0–34.0)
MCHC: 33.3 g/dL (ref 30.0–36.0)
MCV: 94 fL (ref 78.0–100.0)
Platelets: 213 10*3/uL (ref 150–400)
RBC: 2.68 MIL/uL — ABNORMAL LOW (ref 3.87–5.11)
RDW: 12.4 % (ref 11.5–15.5)
WBC: 5.3 10*3/uL (ref 4.0–10.5)

## 2012-01-17 MED ORDER — OXYCODONE HCL 10 MG PO TB12
10.0000 mg | ORAL_TABLET | Freq: Two times a day (BID) | ORAL | Status: DC
  Administered 2012-01-17: 10 mg via ORAL
  Filled 2012-01-17: qty 1

## 2012-01-17 MED ORDER — OXYCODONE HCL 10 MG PO TB12: 10.0000 mg | ORAL_TABLET | Freq: Two times a day (BID) | ORAL | Status: DC

## 2012-01-17 NOTE — Progress Notes (Signed)
Cm spoke with patient concerning dc planning. PT suggest SNF. Per pt no one available to assist in home care. SW consult placed for SNF placement. Cm will continue to follow if dc plans change 7 pt decides to dc with HH services.    Courtney [name] [phone]

## 2012-01-17 NOTE — Progress Notes (Signed)
   Subjective: 3 Days Post-Op Procedure(s) (LRB): TOTAL HIP ARTHROPLASTY (Left) Patient reports pain as moderate.   Patient is having problems with numbness on dorsum of foot present since surgery Plan is to go Home after hospital stay.  Objective: Vital signs in last 24 hours: Temp:  [97.9 F (36.6 C)-99.6 F (37.6 C)] 99.6 F (37.6 C) (09/21 0643) Pulse Rate:  [86-94] 86  (09/21 0643) Resp:  [14-16] 14  (09/21 0643) BP: (99-108)/(53-67) 108/53 mmHg (09/21 0643) SpO2:  [93 %-95 %] 95 % (09/21 0643)  Intake/Output from previous day:  Intake/Output Summary (Last 24 hours) at 01/17/12 0746 Last data filed at 01/16/12 2325  Gross per 24 hour  Intake 1725.83 ml  Output   1100 ml  Net 625.83 ml    Intake/Output this shift:    Labs:  Basename 01/17/12 0505 01/16/12 0410 01/15/12 0435  HGB 8.4* 8.9* 9.8*    Basename 01/17/12 0505 01/16/12 0410  WBC 5.3 6.0  RBC 2.68* 2.84*  HCT 25.2* 26.6*  PLT 213 227    Basename 01/16/12 0410 01/15/12 0435  NA 132* 131*  K 4.4 3.9  CL 97 96  CO2 32 32  BUN 6 6  CREATININE 0.72 0.62  GLUCOSE 116* 114*  CALCIUM 8.3* 8.1*   No results found for this basename: LABPT:2,INR:2 in the last 72 hours  EXAM General - Patient is Alert, Appropriate and Oriented Extremity - Dorsiflexion/Plantar flexion intact Incision: dressing C/D/I No cellulitis present motor 5/5 LLE. Sensation slightly decreased in L5 distribution Dressing/Incision - clean, dry, no drainage Motor Function - intact, moving foot and toes well on exam.   Past Medical History  Diagnosis Date  . IBS (irritable bowel syndrome)   . ADD (attention deficit disorder)   . Shoulder pain, right   . Hyperlipidemia   . Osteoarthritis   . Osteoporosis   . Depression     no bipolar per Dr. Nolen Mu  . Celiac disease     possible vs IBS  . GI problem     [name]  . Anxiety   . Seasonal allergies 01-08-12    hx. of multiple bronchitis related to this.     Assessment/Plan: 3 Days Post-Op Procedure(s) (LRB): TOTAL HIP ARTHROPLASTY (Left) Principal Problem:  *OA (osteoarthritis) of hip   D/C IV fluids Discharge home with home health  DVT Prophylaxis - Xarelto Weight Bearing As Tolerated left Leg  Frantz Quattrone V 01/17/2012, 7:46 AM

## 2012-01-17 NOTE — Progress Notes (Signed)
Pt chose to go home with HHPT.  No further CSW needs.  Leron Croak, LCSWA [employer] [phone]

## 2012-01-17 NOTE — Progress Notes (Signed)
Pt refused SNP placement and stated that her daughter would be at home with her to help with all her needs.

## 2012-01-17 NOTE — Progress Notes (Signed)
Physical Therapy Treatment Patient Details Name: Courtney [name] MRN: [id number] DOB: Sep 14, 1950 Today's Date: 01/17/2012 Time: 0906-1007 PT Time Calculation (min): 61 min  PT Assessment / Plan / Recommendation Comments on Treatment Session  Pt wished to go through joint book given from consult in [place] so discussed current and home exercises, car transfers, correct sit to stand transfers and answered pt questions about care at while at home.  Pt also ambulated in hallway and performed stairs.  Pt doing better with mobility however still concerned about going home and being alone for periods of time.    Follow Up Recommendations  Skilled nursing facility    Barriers to Discharge        Equipment Recommendations  3 in 1 bedside comode    Recommendations for Other Services    Frequency     Plan Discharge plan remains appropriate;Frequency remains appropriate    Precautions / Restrictions Precautions Precautions: Posterior Hip Precaution Comments: reviewed all hip precaution esp with mobility Restrictions Weight Bearing Restrictions: No LLE Weight Bearing: Weight bearing as tolerated   Pertinent Vitals/Pain 4-5/10 L hip pain, ice applied, repositioned, premedicated    Mobility  Bed Mobility Bed Mobility: Supine to Sit;Sit to Supine Supine to Sit: 5: Supervision;HOB flat;With rails Sit to Supine: 5: Supervision;HOB flat Details for Bed Mobility Assistance: pt used R LE to assist L LE onto and off of bed, increased time and effort to perform however did not require physical assist Transfers Transfers: Stand to Sit;Sit to Stand Sit to Stand: 4: Min guard;With upper extremity assist;From chair/3-in-1;From bed Stand to Sit: 4: Min guard;With upper extremity assist;To chair/3-in-1;To bed Details for Transfer Assistance: verbal cues for safe technique including L LE forward, performed multiple times for rest breaks and then [facility] transfer Ambulation/Gait Ambulation/Gait  Assistance: 4: Min guard Ambulation Distance (Feet): 100 Feet (total) Assistive device: Rolling walker Ambulation/Gait Assistance Details: pt ambulated 40 feet then rest break and brought to stairs then performed 60 feet after stairs, pt doing better with maintaining hip precautions with gait Gait Pattern: Step-to pattern;Antalgic Gait velocity: decreased Stairs: Yes Stairs Assistance: 4: Min guard Stairs Assistance Details (indicate cue type and reason): demonstrated technique for pt then pt given verbal cues for safe technique, increased time for instructions and performing Stair Management Technique: Step to pattern;Backwards;With walker Number of Stairs: 4     Exercises     PT Diagnosis:    PT Problem List:   PT Treatment Interventions:     PT Goals Acute Rehab PT Goals PT Goal: Supine/Side to Sit - Progress: Partly met PT Goal: Sit to Supine/Side - Progress: Partly met PT Goal: Sit to Stand - Progress: Progressing toward goal PT Goal: Stand to Sit - Progress: Progressing toward goal PT Goal: Ambulate - Progress: Progressing toward goal PT Goal: Up/Down Stairs - Progress: Partly met  Visit Information  Last PT Received On: 01/17/12 Assistance Needed: +1    Subjective Data  Subjective: Let me show you this joint book I got from [place].   Cognition  Overall Cognitive Status: Appears within functional limits for tasks assessed/performed    Balance     End of Session PT - End of Session Equipment Utilized During Treatment: Gait belt Activity Tolerance: Patient tolerated treatment well Patient left: in bed;with call bell/phone within reach   GP     [place]way Fedora,KATHrine E 01/17/2012, 10:21 AM Pager: [phone]

## 2012-01-17 NOTE — Progress Notes (Signed)
Cm spoke with patient concerning dc planning. Pt states refusing SNF. Per pt choice [facility] to provide [facility] services upon discharge. Start of care 01/20/12. Pt request hospital bed. [facility] notified of DME request. DME scheduled delivery today upon pt's discharge. Pt states having access to DME. Pt states daughter to assist in home care.   Leonie Green [phone]

## 2012-01-17 NOTE — Progress Notes (Signed)
Pt stable, scripts, and d/c instructions given with no questions/concerns voiced by pt or husband.  Pt transferred via wheelchair to private vehicle with husband and NT.

## 2012-02-11 ENCOUNTER — Other Ambulatory Visit: Payer: Self-pay | Admitting: Obstetrics and Gynecology

## 2012-04-06 ENCOUNTER — Ambulatory Visit: Payer: Federal, State, Local not specified - PPO

## 2012-04-06 DIAGNOSIS — Z23 Encounter for immunization: Secondary | ICD-10-CM

## 2012-05-25 ENCOUNTER — Encounter: Payer: Self-pay | Admitting: Internal Medicine

## 2012-05-25 ENCOUNTER — Ambulatory Visit (INDEPENDENT_AMBULATORY_CARE_PROVIDER_SITE_OTHER): Payer: Federal, State, Local not specified - PPO | Admitting: Internal Medicine

## 2012-05-25 VITALS — BP 120/70 | HR 76 | Temp 98.1°F | Resp 16 | Wt 127.0 lb

## 2012-05-25 DIAGNOSIS — M169 Osteoarthritis of hip, unspecified: Secondary | ICD-10-CM

## 2012-05-25 DIAGNOSIS — M1610 Unilateral primary osteoarthritis, unspecified hip: Secondary | ICD-10-CM

## 2012-05-25 DIAGNOSIS — F329 Major depressive disorder, single episode, unspecified: Secondary | ICD-10-CM

## 2012-05-25 DIAGNOSIS — F3289 Other specified depressive episodes: Secondary | ICD-10-CM

## 2012-05-25 DIAGNOSIS — M1990 Unspecified osteoarthritis, unspecified site: Secondary | ICD-10-CM

## 2012-05-25 DIAGNOSIS — Z8249 Family history of ischemic heart disease and other diseases of the circulatory system: Secondary | ICD-10-CM

## 2012-05-25 DIAGNOSIS — F988 Other specified behavioral and emotional disorders with onset usually occurring in childhood and adolescence: Secondary | ICD-10-CM

## 2012-05-25 DIAGNOSIS — E785 Hyperlipidemia, unspecified: Secondary | ICD-10-CM

## 2012-05-25 MED ORDER — VALACYCLOVIR HCL 500 MG PO TABS: 500.0000 mg | ORAL_TABLET | Freq: Two times a day (BID) | ORAL | Status: DC

## 2012-05-25 MED ORDER — VITAMIN D 1000 UNITS PO TABS: 1000.0000 [IU] | ORAL_TABLET | Freq: Every day | ORAL | Status: AC

## 2012-05-25 MED ORDER — AMOXICILLIN 500 MG PO CAPS: 1000.0000 mg | ORAL_CAPSULE | Freq: Two times a day (BID) | ORAL | Status: DC

## 2012-05-25 MED ORDER — TRIAMCINOLONE ACETONIDE 0.5 % EX OINT: TOPICAL_OINTMENT | Freq: Two times a day (BID) | CUTANEOUS | Status: DC

## 2012-05-25 NOTE — Assessment & Plan Note (Signed)
  On diet  

## 2012-05-25 NOTE — Assessment & Plan Note (Signed)
TS/p THR

## 2012-05-25 NOTE — Progress Notes (Signed)
  Subjective:    Sinusitis This is a new problem. The current episode started 1 to 4 weeks ago. The problem has been gradually worsening since onset. There has been no fever. Her pain is at a severity of 5/10. The pain is moderate. Pertinent negatives include no chills, congestion, coughing, headaches or sinus pressure. Past treatments include acetaminophen, spray decongestants and saline sprays. The treatment provided no relief.   R side - green d/c. C/o R nose cold sore  The patient presents for a follow-up of  chronic hypertension, chronic dyslipidemia, OA L hip - she had a THR. She has spinal stenosis Dr Despina Hick an a spinal surgeon  Wt Readings from Last 3 Encounters:  05/25/12 127 lb (57.607 kg)  01/14/12 120 lb (54.432 kg)  01/14/12 120 lb (54.432 kg)   BP Readings from Last 3 Encounters:  05/25/12 120/70  01/17/12 108/53  01/17/12 108/53      Review of Systems  Constitutional: Negative for chills, activity change, appetite change, fatigue and unexpected weight change.  HENT: Negative for congestion, mouth sores and sinus pressure.   Eyes: Negative for visual disturbance.  Respiratory: Negative for cough and chest tightness.   Gastrointestinal: Negative for nausea and abdominal pain.  Genitourinary: Negative for frequency, difficulty urinating and vaginal pain.  Musculoskeletal: Positive for back pain and arthralgias (SI L). Negative for gait problem.  Skin: Negative for pallor and rash.  Neurological: Negative for dizziness, tremors, weakness, numbness and headaches.  Psychiatric/Behavioral: Negative for confusion and sleep disturbance. The patient is not nervous/anxious.        Objective:   Physical Exam  Constitutional: She appears well-developed and well-nourished. No distress.  HENT:  Head: Normocephalic.  Right Ear: External ear normal.  Left Ear: External ear normal.  Nose: Nose normal.  Mouth/Throat: Oropharynx is clear and moist.  Eyes: Conjunctivae normal  are normal. Pupils are equal, round, and reactive to light. Right eye exhibits no discharge. Left eye exhibits no discharge.  Neck: Normal range of motion. Neck supple. No JVD present. No tracheal deviation present. No thyromegaly present.  Cardiovascular: Normal rate, regular rhythm and normal heart sounds.   Pulmonary/Chest: No stridor. No respiratory distress. She has no wheezes.  Abdominal: Soft. Bowel sounds are normal. She exhibits no distension and no mass. There is no tenderness. There is no rebound and no guarding.  Musculoskeletal: She exhibits tenderness (L SI is tender). She exhibits no edema.  Lymphadenopathy:    She has no cervical adenopathy.  Neurological: She displays normal reflexes. No cranial nerve deficit. She exhibits normal muscle tone. Coordination normal.  Skin: No rash noted. No erythema.  Psychiatric: She has a normal mood and affect. Her behavior is normal. Judgment and thought content normal.  swollen nasal mucosa  R cold sore    Lab Results  Component Value Date   WBC 5.3 01/17/2012   HGB 8.4* 01/17/2012   HCT 25.2* 01/17/2012   PLT 213 01/17/2012   CHOL 234* 10/02/2011   TRIG 66.0 10/02/2011   HDL 105.10 10/02/2011   LDLDIRECT 123.9 10/02/2011   ALT 12 01/08/2012   AST 18 01/08/2012   NA 132* 01/16/2012   K 4.4 01/16/2012   CL 97 01/16/2012   CREATININE 0.72 01/16/2012   BUN 6 01/16/2012   CO2 32 01/16/2012   TSH 1.81 10/02/2011   INR 0.96 01/08/2012   HGBA1C 5.6 10/02/2011         Assessment & Plan:

## 2012-05-25 NOTE — Assessment & Plan Note (Signed)
Strong family h/o CAD Card consult Dr Tenny Craw

## 2012-05-25 NOTE — Assessment & Plan Note (Signed)
Continue with current prescription therapy w/low dose Adderall w/caution.

## 2012-05-25 NOTE — Assessment & Plan Note (Addendum)
Chronic  Involving hands mostly Rheum cons was offered

## 2012-05-26 ENCOUNTER — Encounter: Payer: Self-pay | Admitting: Internal Medicine

## 2012-06-10 ENCOUNTER — Institutional Professional Consult (permissible substitution): Payer: Federal, State, Local not specified - PPO | Admitting: Internal Medicine

## 2012-08-04 ENCOUNTER — Encounter: Payer: Self-pay | Admitting: Internal Medicine

## 2012-08-04 ENCOUNTER — Other Ambulatory Visit (INDEPENDENT_AMBULATORY_CARE_PROVIDER_SITE_OTHER): Payer: Federal, State, Local not specified - PPO

## 2012-08-04 ENCOUNTER — Ambulatory Visit (INDEPENDENT_AMBULATORY_CARE_PROVIDER_SITE_OTHER): Payer: Federal, State, Local not specified - PPO | Admitting: Internal Medicine

## 2012-08-04 ENCOUNTER — Telehealth: Payer: Self-pay

## 2012-08-04 ENCOUNTER — Telehealth: Payer: Self-pay | Admitting: Internal Medicine

## 2012-08-04 VITALS — BP 120/70 | HR 80 | Temp 98.2°F | Resp 16 | Wt 129.0 lb

## 2012-08-04 DIAGNOSIS — L853 Xerosis cutis: Secondary | ICD-10-CM

## 2012-08-04 DIAGNOSIS — L258 Unspecified contact dermatitis due to other agents: Secondary | ICD-10-CM

## 2012-08-04 DIAGNOSIS — E785 Hyperlipidemia, unspecified: Secondary | ICD-10-CM

## 2012-08-04 DIAGNOSIS — K5900 Constipation, unspecified: Secondary | ICD-10-CM | POA: Insufficient documentation

## 2012-08-04 DIAGNOSIS — K5904 Chronic idiopathic constipation: Secondary | ICD-10-CM | POA: Insufficient documentation

## 2012-08-04 DIAGNOSIS — R5381 Other malaise: Secondary | ICD-10-CM | POA: Insufficient documentation

## 2012-08-04 DIAGNOSIS — Z8639 Personal history of other endocrine, nutritional and metabolic disease: Secondary | ICD-10-CM | POA: Insufficient documentation

## 2012-08-04 DIAGNOSIS — F988 Other specified behavioral and emotional disorders with onset usually occurring in childhood and adolescence: Secondary | ICD-10-CM

## 2012-08-04 DIAGNOSIS — R5383 Other fatigue: Secondary | ICD-10-CM

## 2012-08-04 DIAGNOSIS — R634 Abnormal weight loss: Secondary | ICD-10-CM

## 2012-08-04 DIAGNOSIS — Z862 Personal history of diseases of the blood and blood-forming organs and certain disorders involving the immune mechanism: Secondary | ICD-10-CM

## 2012-08-04 DIAGNOSIS — K5909 Other constipation: Secondary | ICD-10-CM

## 2012-08-04 LAB — TSH: TSH: 3.01 u[IU]/mL (ref 0.35–5.50)

## 2012-08-04 LAB — T3, FREE: T3, Free: 2.6 pg/mL (ref 2.3–4.2)

## 2012-08-04 LAB — LIPID PANEL
Cholesterol: 237 mg/dL — ABNORMAL HIGH (ref 0–200)
HDL: 92.9 mg/dL (ref >39.00)
Total CHOL/HDL Ratio: 3
Triglycerides: 109 mg/dL (ref 0.0–149.0)
VLDL: 21.8 mg/dL (ref 0.0–40.0)

## 2012-08-04 LAB — LDL CHOLESTEROL, DIRECT: Direct LDL: 120.7 mg/dL

## 2012-08-04 LAB — T4, FREE: Free T4: 0.62 ng/dL (ref 0.60–1.60)

## 2012-08-04 MED ORDER — FLUTICASONE PROPIONATE 50 MCG/ACT NA SUSP: 2.0000 | Freq: Every day | NASAL | Status: DC

## 2012-08-04 MED ORDER — VALACYCLOVIR HCL 500 MG PO TABS: 500.0000 mg | ORAL_TABLET | Freq: Two times a day (BID) | ORAL | Status: DC

## 2012-08-04 MED ORDER — LUBIPROSTONE 24 MCG PO CAPS: 24.0000 ug | ORAL_CAPSULE | Freq: Every day | ORAL | Status: DC | PRN

## 2012-08-04 NOTE — Assessment & Plan Note (Signed)
Use gloves Triamcinolone cream prn

## 2012-08-04 NOTE — Telephone Encounter (Signed)
Pt called stating that she believes there may be an endocrinological connection with her splitting and ingrown nails and hormone levels. Pt is requesting a hormone panel and referral to "specialist". Pt advised to make an appointment to discuss sxs with PCP so he can assess need for referral or testing. Pt agreed and was transferred to schedule appt.

## 2012-08-04 NOTE — Assessment & Plan Note (Signed)
Remote  Labs Endocr cons w/Dr Talmage Nap

## 2012-08-04 NOTE — Assessment & Plan Note (Signed)
On Rx 

## 2012-08-04 NOTE — Telephone Encounter (Signed)
Courtney [name], please, inform patient that all labs are normal except for a "borderline" or low normal thyroid test FT4. Pt can f/u on it w/Dr Talmage Nap. Thx

## 2012-08-04 NOTE — Assessment & Plan Note (Signed)
D/c calcium

## 2012-08-04 NOTE — Progress Notes (Signed)
   Subjective:    HPI  C/o dry skin - hands and in general C/o allergies C/o constipation  The patient presents for a follow-up of  chronic hypertension, chronic dyslipidemia, OA L hip - she had a THR. She has spinal stenosis Dr Despina Hick an a spinal surgeon  Wt Readings from Last 3 Encounters:  08/04/12 129 lb (58.514 kg)  05/25/12 127 lb (57.607 kg)  01/14/12 120 lb (54.432 kg)   BP Readings from Last 3 Encounters:  08/04/12 120/70  05/25/12 120/70  01/17/12 108/53      Review of Systems  Constitutional: Negative for activity change, appetite change, fatigue and unexpected weight change.  HENT: Negative for mouth sores.   Eyes: Negative for visual disturbance.  Respiratory: Negative for chest tightness.   Gastrointestinal: Negative for nausea and abdominal pain.  Genitourinary: Negative for frequency, difficulty urinating and vaginal pain.  Musculoskeletal: Negative for back pain, arthralgias and gait problem.  Skin: Negative for pallor and rash.  Neurological: Negative for dizziness, tremors, weakness and numbness.  Psychiatric/Behavioral: Negative for confusion and sleep disturbance. The patient is not nervous/anxious.        Objective:   Physical Exam  Constitutional: She appears well-developed and well-nourished. No distress.  HENT:  Head: Normocephalic.  Right Ear: External ear normal.  Left Ear: External ear normal.  Nose: Nose normal.  Mouth/Throat: Oropharynx is clear and moist.  Eyes: Conjunctivae are normal. Pupils are equal, round, and reactive to light. Right eye exhibits no discharge. Left eye exhibits no discharge.  Neck: Normal range of motion. Neck supple. No JVD present. No tracheal deviation present. No thyromegaly present.  Cardiovascular: Normal rate, regular rhythm and normal heart sounds.   Pulmonary/Chest: No stridor. No respiratory distress. She has no wheezes.  Abdominal: Soft. Bowel sounds are normal. She exhibits no distension and no mass.  There is no tenderness. There is no rebound and no guarding.  Musculoskeletal: She exhibits no edema and no tenderness.  Lymphadenopathy:    She has no cervical adenopathy.  Neurological: She displays normal reflexes. No cranial nerve deficit. She exhibits normal muscle tone. Coordination normal.  Skin: No rash noted. No erythema.  Psychiatric: She has a normal mood and affect. Her behavior is normal. Judgment and thought content normal.  swollen nasal mucosa  R cold sore    Lab Results  Component Value Date   WBC 5.3 01/17/2012   HGB 8.4* 01/17/2012   HCT 25.2* 01/17/2012   PLT 213 01/17/2012   CHOL 234* 10/02/2011   TRIG 66.0 10/02/2011   HDL 105.10 10/02/2011   LDLDIRECT 123.9 10/02/2011   ALT 12 01/08/2012   AST 18 01/08/2012   NA 132* 01/16/2012   K 4.4 01/16/2012   CL 97 01/16/2012   CREATININE 0.72 01/16/2012   BUN 6 01/16/2012   CO2 32 01/16/2012   TSH 1.81 10/02/2011   INR 0.96 01/08/2012   HGBA1C 5.6 10/02/2011         Assessment & Plan:

## 2012-08-04 NOTE — Assessment & Plan Note (Signed)
Wt Readings from Last 3 Encounters:  08/04/12 129 lb (58.514 kg)  05/25/12 127 lb (57.607 kg)  01/14/12 120 lb (54.432 kg)  better

## 2012-08-05 NOTE — Telephone Encounter (Signed)
Left mess for patient to call back.  

## 2012-08-06 NOTE — Telephone Encounter (Signed)
Notified pt with md response. appt already been set up to see Dr. Talmage Nap wanting labs fax over...lmb

## 2012-11-12 ENCOUNTER — Emergency Department (HOSPITAL_COMMUNITY): Payer: Federal, State, Local not specified - PPO

## 2012-11-12 ENCOUNTER — Encounter (HOSPITAL_COMMUNITY): Payer: Self-pay | Admitting: Emergency Medicine

## 2012-11-12 ENCOUNTER — Emergency Department (HOSPITAL_COMMUNITY)
Admission: EM | Admit: 2012-11-12 | Discharge: 2012-11-12 | Disposition: A | Discharge status: Home / Self Care | Payer: Federal, State, Local not specified - PPO | Attending: Emergency Medicine | Admitting: Emergency Medicine

## 2012-11-12 DIAGNOSIS — Z8659 Personal history of other mental and behavioral disorders: Secondary | ICD-10-CM | POA: Insufficient documentation

## 2012-11-12 DIAGNOSIS — F329 Major depressive disorder, single episode, unspecified: Secondary | ICD-10-CM | POA: Insufficient documentation

## 2012-11-12 DIAGNOSIS — S199XXA Unspecified injury of neck, initial encounter: Secondary | ICD-10-CM | POA: Insufficient documentation

## 2012-11-12 DIAGNOSIS — F411 Generalized anxiety disorder: Secondary | ICD-10-CM | POA: Insufficient documentation

## 2012-11-12 DIAGNOSIS — IMO0002 Reserved for concepts with insufficient information to code with codable children: Secondary | ICD-10-CM | POA: Insufficient documentation

## 2012-11-12 DIAGNOSIS — Y9229 Other specified public building as the place of occurrence of the external cause: Secondary | ICD-10-CM | POA: Insufficient documentation

## 2012-11-12 DIAGNOSIS — S80812A Abrasion, left lower leg, initial encounter: Secondary | ICD-10-CM

## 2012-11-12 DIAGNOSIS — S4980XA Other specified injuries of shoulder and upper arm, unspecified arm, initial encounter: Secondary | ICD-10-CM | POA: Insufficient documentation

## 2012-11-12 DIAGNOSIS — R040 Epistaxis: Secondary | ICD-10-CM | POA: Insufficient documentation

## 2012-11-12 DIAGNOSIS — S46909A Unspecified injury of unspecified muscle, fascia and tendon at shoulder and upper arm level, unspecified arm, initial encounter: Secondary | ICD-10-CM | POA: Insufficient documentation

## 2012-11-12 DIAGNOSIS — Z8639 Personal history of other endocrine, nutritional and metabolic disease: Secondary | ICD-10-CM | POA: Insufficient documentation

## 2012-11-12 DIAGNOSIS — M25511 Pain in right shoulder: Secondary | ICD-10-CM

## 2012-11-12 DIAGNOSIS — W010XXA Fall on same level from slipping, tripping and stumbling without subsequent striking against object, initial encounter: Secondary | ICD-10-CM | POA: Insufficient documentation

## 2012-11-12 DIAGNOSIS — Y9301 Activity, walking, marching and hiking: Secondary | ICD-10-CM | POA: Insufficient documentation

## 2012-11-12 DIAGNOSIS — Z8719 Personal history of other diseases of the digestive system: Secondary | ICD-10-CM | POA: Insufficient documentation

## 2012-11-12 DIAGNOSIS — S0993XA Unspecified injury of face, initial encounter: Secondary | ICD-10-CM | POA: Insufficient documentation

## 2012-11-12 DIAGNOSIS — Z87891 Personal history of nicotine dependence: Secondary | ICD-10-CM | POA: Insufficient documentation

## 2012-11-12 DIAGNOSIS — Z862 Personal history of diseases of the blood and blood-forming organs and certain disorders involving the immune mechanism: Secondary | ICD-10-CM | POA: Insufficient documentation

## 2012-11-12 DIAGNOSIS — F3289 Other specified depressive episodes: Secondary | ICD-10-CM | POA: Insufficient documentation

## 2012-11-12 DIAGNOSIS — S025XXA Fracture of tooth (traumatic), initial encounter for closed fracture: Secondary | ICD-10-CM | POA: Insufficient documentation

## 2012-11-12 DIAGNOSIS — Z8739 Personal history of other diseases of the musculoskeletal system and connective tissue: Secondary | ICD-10-CM | POA: Insufficient documentation

## 2012-11-12 DIAGNOSIS — Z79899 Other long term (current) drug therapy: Secondary | ICD-10-CM | POA: Insufficient documentation

## 2012-11-12 MED ORDER — ACETAMINOPHEN 500 MG PO TABS
1000.0000 mg | ORAL_TABLET | Freq: Once | ORAL | Status: AC
  Administered 2012-11-12: 1000 mg via ORAL
  Filled 2012-11-12: qty 2

## 2012-11-12 NOTE — ED Provider Notes (Signed)
History    CSN: [id number] Arrival date & time 11/12/12  1203  First MD Initiated Contact with Patient 11/12/12 1239     Chief Complaint  Patient presents with  . Fall  . Neck Pain  . nose pain   . Dental Pain  . Shoulder Pain    right  . Arm Pain    right   (Consider location/radiation/quality/duration/timing/severity/associated sxs/prior Treatment) HPI Comments: Patient is a 62 year old female with no significant past medical history presents after a mechanical fall at a produce store. Patient primarily c/o R shoulder pain, facial pain and nasal bone pain, but does endorse diffuse arthralgias. Patient states she was walking when she tripped on the wheel of a small wheelbarrow that was on the floor in front of her. Patient fell forward with primary impact to her chin and nose. Patient with subsequent epistaxis after the fall. She describes the pain in the areas described as aching and intermittently sharp with palpation or movement. She denies LOC, vision changes, tinnitus, hearing loss, difficulty speaking or swallowing, N/V, numbness/tingling, and extremity weakness. Epistaxis resolved on arrival. Patient denies use of blood thinners.  The history is provided by the patient. No language interpreter was used.   Past Medical History  Diagnosis Date  . IBS (irritable bowel syndrome)   . ADD (attention deficit disorder)   . Shoulder pain, right   . Hyperlipidemia   . Osteoarthritis   . Osteoporosis   . Depression     no bipolar per Dr. Nolen [name]  . Celiac disease     possible vs IBS  . GI problem     Courtney [name]  . Anxiety   . Seasonal allergies 01-08-12    hx. of multiple bronchitis related to this.   Past Surgical History  Procedure Laterality Date  . Total hip arthroplasty      right. 2002  . Tonsillectomy    . Exploratory laparotomy  01-08-12    due to endometriosis-portions of both ovaries removed  . Ventral hernia repair  01-08-12    abdominal  . Total hip arthroplasty   01/14/2012    Procedure: TOTAL HIP ARTHROPLASTY;  Surgeon: Loanne Drilling, MD;  Location: [facility];  Service: Orthopedics;  Laterality: Left;   Family History  Problem Relation Age of Onset  . Coronary artery disease      FH Female 1st degree relative <60  . ADD / ADHD     History  Substance Use Topics  . Smoking status: Former Games developer  . Smokeless tobacco: Not on file     Comment: only social  . Alcohol Use: Yes     Comment: rare social    OB History   Grav Para Term Preterm Abortions TAB SAB Ect Mult Living                 Review of Systems  HENT: Negative for hearing loss, trouble swallowing and tinnitus.   Eyes: Negative for visual disturbance.  Respiratory: Negative for shortness of breath.   Gastrointestinal: Negative for nausea and vomiting.  Musculoskeletal: Positive for myalgias, back pain and arthralgias.  Skin: Positive for wound (L anterior shin).  Neurological: Negative for speech difficulty, weakness and numbness.  All other systems reviewed and are negative.    Allergies  Bactrim; Morphine and related; Ceclor; Clarithromycin; Erythromycin; Levofloxacin; Loratadine; Olanzapine; Propoxyphene-acetaminophen; and Zithromax  Home Medications   Current Outpatient Rx  Name  Route  Sig  Dispense  Refill  . amphetamine-dextroamphetamine (ADDERALL) 10 MG  tablet   Oral   Take 10 mg by mouth 2 (two) times daily.          . cholecalciferol (VITAMIN D) 1000 UNITS tablet   Oral   Take 1 tablet (1,000 Units total) by mouth daily.   100 tablet   3   . Coenzyme Q10 (CO Q 10 PO)   Oral   Take by mouth.         . fish oil-omega-3 fatty acids 1000 MG capsule   Oral   Take by mouth daily.         . Multiple Vitamin (MULTIVITAMIN WITH MINERALS) TABS   Oral   Take 1 tablet by mouth daily.         [name] venlafaxine XR (EFFEXOR-XR) 37.5 MG 24 hr capsule   Oral   Take 37.5 mg by mouth daily with breakfast.          BP 144/70  Pulse 91  Temp(Src) 98.1 F  (36.7 C) (Oral)  Resp 14  SpO2 99%  LMP 01/08/1999  Physical Exam  Nursing note and vitals reviewed. Constitutional: She is oriented to person, place, and time. She appears well-developed and well-nourished. No distress.  HENT:  Head: Normocephalic. Head is without raccoon's eyes and without Battle's sign.    Nose:    Mouth/Throat: Oropharynx is clear and moist. No oropharyngeal exudate.  No oral bleeding or dental fractures. Airway and nares patent. No Battle's sign or Raccoon's eyes. No active epistaxis. Contusion to inferior cartilage of nose.  Eyes: Conjunctivae and EOM are normal. Pupils are equal, round, and reactive to light. No scleral icterus.  Neck: Normal range of motion.  Cardiovascular: Normal rate, regular rhythm and intact distal pulses.   Distal radial, dorsalis pedis, and posterior tibial pulses 2+ bilaterally. Capillary refill normal.  Pulmonary/Chest: Effort normal. No respiratory distress.  Musculoskeletal: She exhibits no edema.       Right shoulder: She exhibits decreased range of motion (Secondary to pain), tenderness and pain. She exhibits no bony tenderness, no swelling, no effusion, no crepitus, no deformity, no spasm, normal pulse and normal strength.       Left shoulder: Normal.       Right wrist: She exhibits tenderness and bony tenderness. She exhibits normal range of motion, no swelling, no effusion, no crepitus, no deformity and no laceration.       Cervical back: She exhibits tenderness. She exhibits normal range of motion, no bony tenderness, no swelling, no edema, no deformity, no laceration, no pain, no spasm and normal pulse.       Thoracic back: Normal.       Lumbar back: Normal.       Back:  Patient exhibits full range of motion of her cervical, thoracic, and lumbar spine without midline tenderness to palpation. No bony deformities or step-offs palpated.  Neurological: She is oriented to person, place, and time. She has normal reflexes. No  cranial nerve deficit.  Patient speaks in full goal oriented sentences. Cranial nerves III through XII grossly intact. Patient exhibits equal grip strength bilaterally with 5/5 strength against resistance in her upper and lower extremities. DTRs normal symmetric. No sensory or motor deficits appreciated and patient was extremities without ataxia. She is ambulatory without assistance and with normal gait.  Skin: Skin is warm and dry. No rash noted. She is not diaphoretic. No pallor.     Abrasion to left anterior shin  Psychiatric: She has a normal mood and affect. Her behavior  is normal.    ED Course  Procedures (including critical care time) Labs Reviewed - No data to display Dg Orthopantogram  11/12/2012   *RADIOLOGY REPORT*  Clinical Data: Fall with jaw and dental pain.  ORTHOPANTOGRAM/PANORAMIC  Comparison: None.  Findings: No acute fracture is identified.  The temporomandibular joints show normal alignment.  Teeth demonstrate no gross abnormalities with evidence of prior dental amalgam.  Prosthetic left upper incisor also identified.  IMPRESSION: No acute abnormalities identified.   Original Report Authenticated By: Irish Lack, M.D.   Dg Cervical Spine Complete  11/12/2012   *RADIOLOGY REPORT*  Clinical Data: Neck pain.  Dental pain.  CERVICAL SPINE - COMPLETE 4+ VIEW  Comparison: None.  Findings: There is advanced disc space narrowing at C5-6 and C6-7. There is 2 mm of facet mediated slip of C3-4 and C4-5. Mild reversal of the normal cervical lordotic curve.  No fracture.  No osseous destructive lesion.  There is no prevertebral soft tissue swelling.  Bilateral neural foraminal narrowing is worst at C6-7. The odontoid is intact.  Lung apices are clear.  IMPRESSION: Cervical spondylosis as described.   Original Report Authenticated By: Davonna Belling, M.D.   Dg Shoulder Right  11/12/2012   *RADIOLOGY REPORT*  Clinical Data: Fall with right shoulder pain.  RIGHT SHOULDER - 2+ VIEW  Comparison:  None.  Findings: No acute fracture or dislocation is seen.  Mild degenerative changes are seen involving the [facility] joint and glenohumeral joint.  There are some cystic changes in the inferior glenoid.  Soft tissues are unremarkable.  IMPRESSION: No acute fracture.  Degenerative changes are present.   Original Report Authenticated By: Irish Lack, M.D.   Dg Wrist Complete Right  11/12/2012   *RADIOLOGY REPORT*  Clinical Data: Fall with right wrist pain.  RIGHT WRIST - COMPLETE 3+ VIEW  Comparison: None.  Findings: No acute fracture or dislocation is identified.  Mild degenerative changes are seen involving the radiocarpal joint and lunate bone.  Soft tissues are unremarkable.  IMPRESSION: No acute fracture.  Mild degenerative changes are present.   Original Report Authenticated By: Irish Lack, M.D.   1. Abrasion of leg, left, initial encounter   2. Shoulder pain, acute, right   3. Fall due to stumbling, initial encounter    MDM  Patient presents after mechanical fall at produce market with pain, primarily to the R shoulder, R wrist, and chin. Physical exam as above. Patient neurovascularly intact and ambulatory. No focal neurologic deficits or concussive symptoms to suspect intracranial injury; no nystagmus or pain with EOMs to suggest entrapment. Do not believe further work up with CT scan imaging is imaging at this time. No acute bony fractures or abnormalities seen on DG of shoulder, wrist, and orhtopantogram. No fracture or subluxation on DG cervical spine. Patient given Tylenol for pain. She is appropriate for d/c with PCP follow up as needed. Return precautions given and patient agreeable to plan.    Antony Madura, PA-C 11/14/12 2009

## 2012-11-12 NOTE — ED Notes (Signed)
Per EMS pt was at a produce store when she tripped over a small display falling face first on the floor. Per pt she wasn't able to brace herself. Pt had nose bleed and bleeding from her mouth that had stopped by the time EMS got on scene.  Pt is c/o neck pain, right shoulder pain, right arm pain, nose and dental pain.  Pt states is felt like her hose went up into her head when she fell.

## 2012-11-12 NOTE — ED Notes (Signed)
Gave pt ice pack for her neck and an ice pack for her face.

## 2012-11-12 NOTE — ED Notes (Signed)
[id number]<AV> Expected date:<BR> Expected time:<BR> Means of arrival:<BR> Comments:<BR> EMS-fall

## 2012-11-15 NOTE — ED Provider Notes (Signed)
Medical screening examination/treatment/procedure(s) were performed by non-physician practitioner and as supervising physician I was immediately available for consultation/collaboration.   Charlise Giovanetti, MD 11/15/12 0728 

## 2013-05-10 ENCOUNTER — Other Ambulatory Visit: Payer: Self-pay | Admitting: Obstetrics and Gynecology

## 2013-06-01 ENCOUNTER — Other Ambulatory Visit: Payer: Self-pay | Admitting: Obstetrics and Gynecology

## 2014-08-08 ENCOUNTER — Other Ambulatory Visit: Payer: Self-pay | Admitting: Obstetrics and Gynecology

## 2014-08-09 LAB — CYTOLOGY - PAP

## 2014-08-14 ENCOUNTER — Other Ambulatory Visit: Payer: Self-pay | Admitting: Obstetrics and Gynecology

## 2014-08-14 DIAGNOSIS — R928 Other abnormal and inconclusive findings on diagnostic imaging of breast: Secondary | ICD-10-CM

## 2014-09-01 ENCOUNTER — Ambulatory Visit
Admission: RE | Admit: 2014-09-01 | Discharge: 2014-09-01 | Disposition: A | Discharge status: Home / Self Care | Payer: Federal, State, Local not specified - PPO | Source: Ambulatory Visit | Attending: Obstetrics and Gynecology | Admitting: Obstetrics and Gynecology

## 2014-09-01 DIAGNOSIS — R928 Other abnormal and inconclusive findings on diagnostic imaging of breast: Secondary | ICD-10-CM

## 2014-09-01 LAB — HM MAMMOGRAPHY

## 2014-09-14 ENCOUNTER — Encounter: Payer: Self-pay | Admitting: Internal Medicine

## 2014-09-14 ENCOUNTER — Telehealth: Payer: Self-pay | Admitting: Internal Medicine

## 2014-09-14 ENCOUNTER — Ambulatory Visit (INDEPENDENT_AMBULATORY_CARE_PROVIDER_SITE_OTHER): Payer: Federal, State, Local not specified - PPO | Admitting: Internal Medicine

## 2014-09-14 ENCOUNTER — Other Ambulatory Visit: Payer: Self-pay

## 2014-09-14 VITALS — BP 124/62 | HR 83 | Temp 98.0°F | Resp 15 | Wt 127.0 lb

## 2014-09-14 DIAGNOSIS — J209 Acute bronchitis, unspecified: Secondary | ICD-10-CM

## 2014-09-14 DIAGNOSIS — J310 Chronic rhinitis: Secondary | ICD-10-CM

## 2014-09-14 MED ORDER — PREDNISONE 20 MG PO TABS: 20.0000 mg | ORAL_TABLET | Freq: Two times a day (BID) | ORAL | Status: DC

## 2014-09-14 MED ORDER — DOXYCYCLINE HYCLATE 100 MG PO TABS: 100.0000 mg | ORAL_TABLET | Freq: Two times a day (BID) | ORAL | Status: DC

## 2014-09-14 MED ORDER — BENZONATATE 200 MG PO CAPS: 200.0000 mg | ORAL_CAPSULE | Freq: Three times a day (TID) | ORAL | Status: DC | PRN

## 2014-09-14 NOTE — Progress Notes (Signed)
   Subjective:    Patient ID: Courtney [name], female    DOB: [birth year], 64 y.o.   MRN: [id number]  HPI Her symptoms began 1 week ago as head congestion and chest congestion with a sensation of "an elephant sitting on my chest". She's had nasal congestion and obstruction with pain in the maxillary sinus areas. She has associated fatigue and frontal headache. She also has dental pain. She's had some sneezing without other extrinsic symptoms. She describes fever, chills, sweats. Tylenol has been of minimal relief.  She smoked in college. She has no history of asthma.   Review of Systems Nasal purulence, sore throat , otic pain or otic discharge denied. Extrinsic symptoms of itchy, watery eyes, or angioedema are denied. There is no sputum production, wheezing,or  paroxysmal nocturnal dyspnea.     Objective:   Physical Exam General appearance:Adequately nourished; no acute distress or increased work of breathing is present.    Lymphatic: No  lymphadenopathy about the head, neck, or axilla .  Eyes: No conjunctival inflammation or lid edema is present. There is no scleral icterus.  Ears:  External ear exam shows no significant lesions or deformities.  Otoscopic examination reveals clear canals, tympanic membranes are intact bilaterally without bulging, retraction, inflammation or discharge.TMs dull  Nose:  External nasal examination shows no deformity or inflammation. Nasal mucosa are severely erythematous without lesions or exudates No septal dislocation or deviation.No obstruction to airflow.   Oral exam: Dental hygiene is good; lips and gums are healthy appearing.There is no oropharyngeal erythema or exudate .  Neck:  No deformities, thyromegaly, masses, or tenderness noted.   Supple with full range of motion without pain.   Heart:  Normal rate and regular rhythm. S1 and S2 normal without gallop, murmur, click, rub or other extra sounds.   Lungs:Chest clear to auscultation; no  wheezes, rhonchi,rales ,or rubs present.Dry cough  Extremities:  No cyanosis, edema, or clubbing  noted    Skin: Warm & dry w/o tenting or jaundice. No significant lesions or rash.        Assessment & Plan:  #1 acute bronchitis w/o bronchospasm #2 non allergic rhinitis Plan: See orders and recommendations

## 2014-09-14 NOTE — Patient Instructions (Signed)
Plain Mucinex (NOT D) for thick secretions ;force NON dairy fluids .   Nasal cleansing in the shower as discussed with lather of mild shampoo.After 10 seconds wash off lather while  exhaling through nostrils. Make sure that all residual soap is removed to prevent irritation.  Flonase OR Nasacort AQ 1 spray in each nostril twice a day as needed. Use the "crossover" technique into opposite nostril spraying toward opposite ear @ 45 degree angle, not straight up into nostril.  Plain Allegra (NOT D )  160 daily , Loratidine 10 mg , OR Zyrtec 10 mg @ bedtime  as needed for itchy eyes & sneezing. Fill the  prescription for prednisone if cough no better in the next 48-72 hours.  Breo one inhalation daily; gargle and spit after use. Lot #: [id number] Exp 01/18

## 2014-09-14 NOTE — Telephone Encounter (Signed)
error 

## 2014-09-14 NOTE — Progress Notes (Signed)
Pre visit review using our clinic review tool, if applicable. No additional management support is needed unless otherwise documented below in the visit note. 

## 2014-11-08 ENCOUNTER — Encounter: Payer: Self-pay | Admitting: Internal Medicine

## 2014-11-20 ENCOUNTER — Encounter: Payer: Self-pay | Admitting: *Deleted

## 2015-01-05 ENCOUNTER — Encounter: Payer: Self-pay | Admitting: Internal Medicine

## 2015-01-05 ENCOUNTER — Ambulatory Visit (INDEPENDENT_AMBULATORY_CARE_PROVIDER_SITE_OTHER): Payer: Federal, State, Local not specified - PPO | Admitting: Internal Medicine

## 2015-01-05 VITALS — BP 118/70 | HR 91 | Temp 98.5°F | Resp 14 | Ht 62.0 in | Wt 123.0 lb

## 2015-01-05 DIAGNOSIS — J310 Chronic rhinitis: Secondary | ICD-10-CM

## 2015-01-05 DIAGNOSIS — J029 Acute pharyngitis, unspecified: Secondary | ICD-10-CM | POA: Diagnosis not present

## 2015-01-05 MED ORDER — AMOXICILLIN-POT CLAVULANATE 500-125 MG PO TABS: ORAL_TABLET | ORAL | Status: DC

## 2015-01-05 MED ORDER — MELOXICAM 15 MG PO TABS: 15.0000 mg | ORAL_TABLET | Freq: Every day | ORAL | Status: DC

## 2015-01-05 NOTE — Progress Notes (Signed)
Pre visit review using our clinic review tool, if applicable. No additional management support is needed unless otherwise documented below in the visit note. 

## 2015-01-05 NOTE — Patient Instructions (Signed)
Zicam Melts or Zinc lozenges as per package label for sore throat .  Complementary options to boost immunity include  vitamin C 2000 mg daily; & Echinacea for 4-7 days.     Plain Mucinex (NOT D) for thick secretions ;force NON dairy fluids .   Nasal cleansing in the shower as discussed with lather of mild shampoo.After 10 seconds wash off lather while  exhaling through nostrils. Make sure that all residual soap is removed to prevent irritation.  Flonase OR Nasacort AQ 1 spray in each nostril twice a day as needed. Use the "crossover" technique into opposite nostril spraying toward opposite ear @ 45 degree angle, not straight up into nostril.  Plain Allegra (NOT D )  160 daily , Loratidine 10 mg , OR Zyrtec 10 mg @ bedtime  as needed for itchy eyes & sneezing.

## 2015-01-05 NOTE — Progress Notes (Signed)
   Subjective:    Patient ID: Courtney [name], female    DOB: 06-17-1950, 64 y.o.   MRN: [id number]  HPI   Her symptoms began 01/01/15 when she was awakened at approximately 4 AM with severe sore throat. She had malaise but went to work. Her symptoms progressed to the point that she was bedridden 9/6 and 9/7. She had chills and fever with a temperature up to 100.2. On 9/7 she did have dental work and took 2000 mg of amoxicillin preop. She stated she felt better with that  At this time she has some nasal congestion. She also has a dry cough.  She has no other localizing signs for fever.   Review of Systems  Frontal headache, facial pain , nasal purulence, dental pain,  otic pain or otic discharge denied.  Dysuria, pyuria, hematuria, frequency, nocturia or polyuria are denied.  She denies any rash or skin lesions  She has no diarrhea.     Objective:   Physical Exam  She has severe erythema of the nasal mucosa. Tonsils are absent and there is no exudate in the oropharynx. There is minimal erythema of the soft palate and uvula.  General appearance:Adequately nourished; no acute distress or increased work of breathing is present.    Lymphatic: No  lymphadenopathy about the head, neck, or axilla .  Eyes: No conjunctival inflammation or lid edema is present. There is no scleral icterus.  Ears:  External ear exam shows no significant lesions or deformities.  Otoscopic examination reveals clear canals, tympanic membranes are intact bilaterally without bulging, retraction, inflammation or discharge.  Nose:  External nasal examination shows no deformity or inflammation.No septal dislocation or deviation.No obstruction to airflow.   Oral exam: Dental hygiene is good; lips and gums are healthy appearing.  Neck:  No deformities, thyromegaly, masses, or tenderness noted.   Supple with full range of motion without pain.   Heart:  Normal rate and regular rhythm. S1 and S2 normal without  gallop, murmur, click, rub or other extra sounds.   Lungs:Chest clear to auscultation; no wheezes, rhonchi,rales ,or rubs present.  Extremities:  No cyanosis, edema, or clubbing  noted    Skin: Warm & dry w/o tenting or jaundice. No significant lesions or rash.        Assessment & Plan:   #1 severe pharyngitis; status post 2 g of amoxicillin as SBE prophylaxis #2 nonallergic rhinitis Plan: See orders recommendations

## 2015-04-20 ENCOUNTER — Ambulatory Visit (INDEPENDENT_AMBULATORY_CARE_PROVIDER_SITE_OTHER): Payer: Federal, State, Local not specified - PPO | Admitting: Internal Medicine

## 2015-04-20 ENCOUNTER — Encounter: Payer: Self-pay | Admitting: Internal Medicine

## 2015-04-20 VITALS — BP 112/70 | HR 91 | Temp 98.2°F | Resp 20 | Ht 62.0 in | Wt 122.8 lb

## 2015-04-20 DIAGNOSIS — E559 Vitamin D deficiency, unspecified: Secondary | ICD-10-CM | POA: Diagnosis not present

## 2015-04-20 DIAGNOSIS — Z23 Encounter for immunization: Secondary | ICD-10-CM

## 2015-04-20 DIAGNOSIS — E785 Hyperlipidemia, unspecified: Secondary | ICD-10-CM

## 2015-04-20 MED ORDER — VITAMIN D3 50 MCG (2000 UT) PO CAPS: 2000.0000 [IU] | ORAL_CAPSULE | Freq: Every day | ORAL | Status: DC

## 2015-04-20 NOTE — Progress Notes (Signed)
Pre visit review using our clinic review tool, if applicable. No additional management support is needed unless otherwise documented below in the visit note. 

## 2015-04-20 NOTE — Progress Notes (Signed)
Subjective:  Patient ID: Courtney [name], female    DOB: 12-08-1950  Age: 64 y.o. MRN: [id number]  CC: No chief complaint on file.   HPI Courtney [name] presents for dyslipidemia f/u  Outpatient Prescriptions Prior to Visit  Medication Sig Dispense Refill  . amphetamine-dextroamphetamine (ADDERALL) 10 MG tablet TK 2 TS PO BID  0  . Coenzyme Q10 (CO Q 10 PO) Take by mouth.    . fish oil-omega-3 fatty acids 1000 MG capsule Take by mouth daily.    . meloxicam (MOBIC) 15 MG tablet Take 1 tablet (15 mg total) by mouth daily. 30 tablet 0  . Multiple Vitamin (MULTIVITAMIN WITH MINERALS) TABS Take 1 tablet by mouth daily.    [name] amoxicillin-clavulanate (AUGMENTIN) 500-125 MG per tablet 1 q 12 hrs after full meals 14 tablet 0  . Vortioxetine HBr 5 MG TABS Take by mouth. Taking 2.5mg      No facility-administered medications prior to visit.    ROS Review of Systems  Constitutional: Negative for chills, activity change, appetite change, fatigue and unexpected weight change.  HENT: Negative for congestion, mouth sores and sinus pressure.   Eyes: Negative for visual disturbance.  Respiratory: Negative for cough and chest tightness.   Gastrointestinal: Negative for nausea and abdominal pain.  Genitourinary: Negative for frequency, difficulty urinating and vaginal pain.  Musculoskeletal: Negative for back pain and gait problem.  Skin: Negative for pallor and rash.  Neurological: Negative for dizziness, tremors, weakness, numbness and headaches.  Psychiatric/Behavioral: Negative for suicidal ideas, confusion and sleep disturbance.    Objective:  BP 112/70 mmHg  Pulse 91  Temp(Src) 98.2 F (36.8 C) (Oral)  Resp 20  Ht 5\' 2"  (1.575 m)  Wt 122 lb 12 oz (55.679 kg)  BMI 22.45 kg/m2  SpO2 97%  LMP 01/08/1999  BP Readings from Last 3 Encounters:  04/20/15 112/70  01/05/15 118/70  09/14/14 124/62    Wt Readings from Last 3 Encounters:  04/20/15 122 lb 12 oz (55.679 kg)    01/05/15 123 lb (55.792 kg)  09/14/14 127 lb (57.607 kg)    Physical Exam  Constitutional: She appears well-developed. No distress.  HENT:  Head: Normocephalic.  Right Ear: External ear normal.  Left Ear: External ear normal.  Nose: Nose normal.  Mouth/Throat: Oropharynx is clear and moist.  Eyes: Conjunctivae are normal. Pupils are equal, round, and reactive to light. Right eye exhibits no discharge. Left eye exhibits no discharge.  Neck: Normal range of motion. Neck supple. No JVD present. No tracheal deviation present. No thyromegaly present.  Cardiovascular: Normal rate, regular rhythm and normal heart sounds.   Pulmonary/Chest: No stridor. No respiratory distress. She has no wheezes.  Abdominal: Soft. Bowel sounds are normal. She exhibits no distension and no mass. There is no tenderness. There is no rebound and no guarding.  Musculoskeletal: She exhibits no edema or tenderness.  Lymphadenopathy:    She has no cervical adenopathy.  Neurological: She displays normal reflexes. No cranial nerve deficit. She exhibits normal muscle tone. Coordination normal.  Skin: No rash noted. No erythema.  Psychiatric: She has a normal mood and affect. Her behavior is normal. Judgment and thought content normal.    Lab Results  Component Value Date   WBC 5.3 01/17/2012   HGB 8.4* 01/17/2012   HCT 25.2* 01/17/2012   PLT 213 01/17/2012   GLUCOSE 116* 01/16/2012   CHOL 237* 08/04/2012   TRIG 109.0 08/04/2012   HDL 92.90 08/04/2012   LDLDIRECT 120.7 08/04/2012  ALT 12 01/08/2012   AST 18 01/08/2012   NA 132* 01/16/2012   K 4.4 01/16/2012   CL 97 01/16/2012   CREATININE 0.72 01/16/2012   BUN 6 01/16/2012   CO2 32 01/16/2012   TSH 3.01 08/04/2012   INR 0.96 01/08/2012   HGBA1C 5.6 10/02/2011    Mm Diag Breast Tomo Uni Left  09/01/2014  CLINICAL DATA:  Possible mass left breast identified on recent screening mammogram. EXAM: DIGITAL DIAGNOSTIC LEFT MAMMOGRAM WITH 3D TOMOSYNTHESIS AND  CAD COMPARISON:  08/08/2014 and earlier priors ACR Breast Density Category b: There are scattered areas of fibroglandular density. FINDINGS: Whole left breast tomographic views in the CC and and injections show no evidence of mass or distortion. Area of concern on recent screening is consistent with overlap of fibroglandular tissue. Mammographic images were processed with CAD. IMPRESSION: No evidence of malignancy in the left breast. RECOMMENDATION: Screening mammogram in one year.(Code:SM-B-01Y) I have discussed the findings and recommendations with the patient. Results were also provided in writing at the conclusion of the visit. If applicable, a reminder letter will be sent to the patient regarding the next appointment. BI-RADS CATEGORY  1: Negative. Electronically Signed   By: Curlene Dolphin M.D.   On: 09/01/2014 10:42    Assessment & Plan:   There are no diagnoses linked to this encounter. I have discontinued [name]'s Vortioxetine HBr and amoxicillin-clavulanate. I am also having her maintain her fish oil-omega-3 fatty acids, multivitamin with minerals, Coenzyme Q10 (CO Q 10 PO), amphetamine-dextroamphetamine, and meloxicam.  No orders of the defined types were placed in this encounter.     Follow-up: No Follow-up on file.  Walker Kehr, MD

## 2015-04-20 NOTE — Assessment & Plan Note (Signed)
Mild. High HDL. Pt declined statins in the past 12/16 pt is willing to use statins

## 2015-05-04 ENCOUNTER — Other Ambulatory Visit (INDEPENDENT_AMBULATORY_CARE_PROVIDER_SITE_OTHER): Payer: Federal, State, Local not specified - PPO

## 2015-05-04 DIAGNOSIS — E559 Vitamin D deficiency, unspecified: Secondary | ICD-10-CM | POA: Diagnosis not present

## 2015-05-04 DIAGNOSIS — E785 Hyperlipidemia, unspecified: Secondary | ICD-10-CM | POA: Diagnosis not present

## 2015-05-04 LAB — HEPATIC FUNCTION PANEL
ALT: 13 U/L (ref 0–35)
AST: 20 U/L (ref 0–37)
Albumin: 4.3 g/dL (ref 3.5–5.2)
Alkaline Phosphatase: 45 U/L (ref 39–117)
Bilirubin, Direct: 0.1 mg/dL (ref 0.0–0.3)
Total Bilirubin: 0.5 mg/dL (ref 0.2–1.2)
Total Protein: 6.7 g/dL (ref 6.0–8.3)

## 2015-05-04 LAB — BASIC METABOLIC PANEL
BUN: 13 mg/dL (ref 6–23)
CO2: 29 mEq/L (ref 19–32)
Calcium: 9.7 mg/dL (ref 8.4–10.5)
Chloride: 103 mEq/L (ref 96–112)
Creatinine, Ser: 0.77 mg/dL (ref 0.40–1.20)
GFR: 79.6 mL/min (ref >60.00)
Glucose, Bld: 88 mg/dL (ref 70–99)
Potassium: 4.5 mEq/L (ref 3.5–5.1)
Sodium: 140 mEq/L (ref 135–145)

## 2015-05-04 LAB — LIPID PANEL
Cholesterol: 271 mg/dL — ABNORMAL HIGH (ref 0–200)
HDL: 89.1 mg/dL (ref >39.00)
LDL Cholesterol: 169 mg/dL — ABNORMAL HIGH (ref 0–99)
NonHDL: 182.15
Total CHOL/HDL Ratio: 3
Triglycerides: 68 mg/dL (ref 0.0–149.0)
VLDL: 13.6 mg/dL (ref 0.0–40.0)

## 2015-05-04 LAB — TSH: TSH: 2.52 u[IU]/mL (ref 0.35–4.50)

## 2015-05-04 LAB — VITAMIN D 25 HYDROXY (VIT D DEFICIENCY, FRACTURES): VITD: 40.81 ng/mL (ref 30.00–100.00)

## 2015-05-09 ENCOUNTER — Ambulatory Visit (INDEPENDENT_AMBULATORY_CARE_PROVIDER_SITE_OTHER): Payer: Federal, State, Local not specified - PPO | Admitting: Internal Medicine

## 2015-05-09 ENCOUNTER — Telehealth: Payer: Self-pay | Admitting: Internal Medicine

## 2015-05-09 ENCOUNTER — Other Ambulatory Visit (INDEPENDENT_AMBULATORY_CARE_PROVIDER_SITE_OTHER): Payer: Federal, State, Local not specified - PPO

## 2015-05-09 ENCOUNTER — Encounter: Payer: Self-pay | Admitting: Internal Medicine

## 2015-05-09 VITALS — BP 120/72 | HR 73 | Temp 98.7°F | Wt 123.0 lb

## 2015-05-09 DIAGNOSIS — E785 Hyperlipidemia, unspecified: Secondary | ICD-10-CM

## 2015-05-09 DIAGNOSIS — R110 Nausea: Secondary | ICD-10-CM

## 2015-05-09 DIAGNOSIS — R1013 Epigastric pain: Secondary | ICD-10-CM

## 2015-05-09 DIAGNOSIS — R1084 Generalized abdominal pain: Secondary | ICD-10-CM | POA: Diagnosis not present

## 2015-05-09 DIAGNOSIS — G8929 Other chronic pain: Secondary | ICD-10-CM

## 2015-05-09 LAB — CBC WITH DIFFERENTIAL/PLATELET
Basophils Absolute: 0 10*3/uL (ref 0.0–0.1)
Basophils Relative: 0.5 % (ref 0.0–3.0)
Eosinophils Absolute: 0.5 10*3/uL (ref 0.0–0.7)
Eosinophils Relative: 7.6 % — ABNORMAL HIGH (ref 0.0–5.0)
HCT: 41.8 % (ref 36.0–46.0)
Hemoglobin: 13.9 g/dL (ref 12.0–15.0)
Lymphocytes Relative: 37.2 % (ref 12.0–46.0)
Lymphs Abs: 2.2 10*3/uL (ref 0.7–4.0)
MCHC: 33.2 g/dL (ref 30.0–36.0)
MCV: 93.4 fl (ref 78.0–100.0)
Monocytes Absolute: 0.7 10*3/uL (ref 0.1–1.0)
Monocytes Relative: 11.5 % (ref 3.0–12.0)
Neutro Abs: 2.6 10*3/uL (ref 1.4–7.7)
Neutrophils Relative %: 43.2 % (ref 43.0–77.0)
Platelets: 300 10*3/uL (ref 150.0–400.0)
RBC: 4.47 Mil/uL (ref 3.87–5.11)
RDW: 13.4 % (ref 11.5–15.5)
WBC: 6 10*3/uL (ref 4.0–10.5)

## 2015-05-09 LAB — URINALYSIS
Bilirubin Urine: NEGATIVE
Hgb urine dipstick: NEGATIVE
Ketones, ur: NEGATIVE
Leukocytes, UA: NEGATIVE
Nitrite: NEGATIVE
Specific Gravity, Urine: 1.01 (ref 1.000–1.030)
Total Protein, Urine: NEGATIVE
Urine Glucose: NEGATIVE
Urobilinogen, UA: 0.2 (ref 0.0–1.0)
pH: 7 (ref 5.0–8.0)

## 2015-05-09 LAB — HEPATIC FUNCTION PANEL
ALT: 12 U/L (ref 0–35)
AST: 18 U/L (ref 0–37)
Albumin: 4.4 g/dL (ref 3.5–5.2)
Alkaline Phosphatase: 44 U/L (ref 39–117)
Bilirubin, Direct: 0.1 mg/dL (ref 0.0–0.3)
Total Bilirubin: 0.3 mg/dL (ref 0.2–1.2)
Total Protein: 6.6 g/dL (ref 6.0–8.3)

## 2015-05-09 LAB — LIPASE: Lipase: 26 U/L (ref 11.0–59.0)

## 2015-05-09 LAB — SEDIMENTATION RATE: Sed Rate: 14 mm/hr (ref 0–22)

## 2015-05-09 MED ORDER — ONDANSETRON HCL 4 MG/2ML IJ SOLN
4.0000 mg | Freq: Once | INTRAMUSCULAR | Status: AC
  Administered 2015-05-09: 4 mg via INTRAMUSCULAR

## 2015-05-09 MED ORDER — DEXLANSOPRAZOLE 60 MG PO CPDR: 60.0000 mg | DELAYED_RELEASE_CAPSULE | Freq: Every day | ORAL | Status: DC

## 2015-05-09 MED ORDER — ONDANSETRON HCL 4 MG PO TABS: 4.0000 mg | ORAL_TABLET | Freq: Three times a day (TID) | ORAL | Status: DC | PRN

## 2015-05-09 NOTE — Progress Notes (Signed)
Subjective:  Patient ID: Courtney [name], female    DOB: Jul 03, 1950  Age: 65 y.o. MRN: [id number]  CC: No chief complaint on file.   HPI Janelis A Andres-Gregg presents for abd pain and nausea after having grits, egg, beacon. Nausea x 2 d. Slept x 12 h  Outpatient Prescriptions Prior to Visit  Medication Sig Dispense Refill  . amphetamine-dextroamphetamine (ADDERALL) 10 MG tablet TK 2 TS PO BID  0  . Cholecalciferol (VITAMIN D3) 2000 UNITS capsule Take 1 capsule (2,000 Units total) by mouth daily. 100 capsule 3  . Coenzyme Q10 (CO Q 10 PO) Take by mouth.    . fish oil-omega-3 fatty acids 1000 MG capsule Take by mouth daily.    . Multiple Vitamin (MULTIVITAMIN WITH MINERALS) TABS Take 1 tablet by mouth daily.     No facility-administered medications prior to visit.    ROS Review of Systems  Constitutional: Negative for chills, activity change, appetite change, fatigue and unexpected weight change.  HENT: Negative for congestion, mouth sores and sinus pressure.   Eyes: Negative for visual disturbance.  Respiratory: Negative for cough and chest tightness.   Cardiovascular: Negative for chest pain.  Gastrointestinal: Positive for nausea and abdominal pain. Negative for diarrhea, constipation and blood in stool.  Genitourinary: Negative for frequency, difficulty urinating and vaginal pain.  Musculoskeletal: Negative for back pain and gait problem.  Skin: Negative for pallor and rash.  Neurological: Negative for dizziness, tremors, weakness, numbness and headaches.  Psychiatric/Behavioral: Negative for confusion and sleep disturbance.    Objective:  BP 120/72 mmHg  Pulse 73  Temp(Src) 98.7 F (37.1 C) (Oral)  Wt 123 lb (55.792 kg)  SpO2 97%  LMP 01/08/1999  BP Readings from Last 3 Encounters:  05/09/15 120/72  04/20/15 112/70  01/05/15 118/70    Wt Readings from Last 3 Encounters:  05/09/15 123 lb (55.792 kg)  04/20/15 122 lb 12 oz (55.679 kg)  01/05/15 123 lb  (55.792 kg)    Physical Exam  Cardiovascular: Normal rate.  Exam reveals no gallop.   No murmur heard. Pulmonary/Chest: She has no rales. She exhibits no tenderness.  Abdominal: She exhibits no distension and no mass. There is tenderness. There is no rebound and no guarding.  epig and RUQ is tender  Lab Results  Component Value Date   WBC 5.3 01/17/2012   HGB 8.4* 01/17/2012   HCT 25.2* 01/17/2012   PLT 213 01/17/2012   GLUCOSE 88 05/04/2015   CHOL 271* 05/04/2015   TRIG 68.0 05/04/2015   HDL 89.10 05/04/2015   LDLDIRECT 120.7 08/04/2012   LDLCALC 169* 05/04/2015   ALT 13 05/04/2015   AST 20 05/04/2015   NA 140 05/04/2015   K 4.5 05/04/2015   CL 103 05/04/2015   CREATININE 0.77 05/04/2015   BUN 13 05/04/2015   CO2 29 05/04/2015   TSH 2.52 05/04/2015   INR 0.96 01/08/2012   HGBA1C 5.6 10/02/2011    Mm Diag Breast Tomo Uni Left  09/01/2014  CLINICAL DATA:  Possible mass left breast identified on recent screening mammogram. EXAM: DIGITAL DIAGNOSTIC LEFT MAMMOGRAM WITH 3D TOMOSYNTHESIS AND CAD COMPARISON:  08/08/2014 and earlier priors ACR Breast Density Category b: There are scattered areas of fibroglandular density. FINDINGS: Whole left breast tomographic views in the CC and and injections show no evidence of mass or distortion. Area of concern on recent screening is consistent with overlap of fibroglandular tissue. Mammographic images were processed with CAD. IMPRESSION: No evidence of malignancy in the  left breast. RECOMMENDATION: Screening mammogram in one year.(Code:SM-B-01Y) I have discussed the findings and recommendations with the patient. Results were also provided in writing at the conclusion of the visit. If applicable, a reminder letter will be sent to the patient regarding the next appointment. BI-RADS CATEGORY  1: Negative. Electronically Signed   By: Curlene Dolphin M.D.   On: 09/01/2014 10:42    Assessment & Plan:   There are no diagnoses linked to this  encounter. I am having Ms. Andres-Gregg maintain her fish oil-omega-3 fatty acids, multivitamin with minerals, Coenzyme Q10 (CO Q 10 PO), amphetamine-dextroamphetamine, Vitamin D3, Vortioxetine HBr, and naproxen.  Meds ordered this encounter  Medications  . Vortioxetine HBr (TRINTELLIX) 5 MG TABS    Sig: Take 1 tablet by mouth daily.  . naproxen (NAPROSYN) 500 MG tablet    Sig: Take 500 mg by mouth 2 (two) times daily with a meal.     Follow-up: No Follow-up on file.  Walker Kehr, MD

## 2015-05-09 NOTE — Assessment & Plan Note (Signed)
1/17 acute of ?etiology: r/o PUD, cholecystitis, pancreatitis, food poisoning Zofran IM now and po prn. Dexilant 60 mg qd Labs Small, low fat meals Abd [place] To ER if worse

## 2015-05-09 NOTE — Telephone Encounter (Signed)
Patient Name: Courtney [name] DOB: [birth year] Initial Comment Caller States nausea, abdominal pain, constipated since yesterday. Nurse Assessment Nurse: Marcelline Deist, RN, Lynda Date/Time (Eastern Time): 05/09/2015 10:46:51 AM Confirm and document reason for call. If symptomatic, describe symptoms. ---Caller States nausea, abdominal pain, constipated since yesterday. Didn't feel well on Monday. After eating at a restaurant yesterday am, did not feel well. Even touching belly is tender to touch. Had lump in throat, then pressure & pain in colon. Had a small BM this am. Not sure if fever. Has the patient traveled out of the country within the last 30 days? ---Not Applicable Does the patient have any new or worsening symptoms? ---Yes Will a triage be completed? ---Yes Related visit to physician within the last 2 weeks? ---No Does the PT have any chronic conditions? (i.e. diabetes, asthma, etc.) ---Yes List chronic conditions. ---arthritis Is this a behavioral health or substance abuse call? ---No Guidelines Guideline Title Affirmed Question Affirmed Notes Nausea Unexplained nausea (all triage questions negative) Final Disposition User See PCP When Office is Open (within 3 days) Marcelline Deist, RN, Kermit Balo Comments Please contact caller if any appts. open up today at office. Nurse is more concerned about the abdominal tenderness that patient is having, although she is more bothered this am by the constant nausea. Referrals GO TO FACILITY UNDECIDED REFERRED TO PCP OFFICE Disagree/Comply: Comply

## 2015-05-09 NOTE — Telephone Encounter (Signed)
Patient Name: Courtney [name] DOB: 01-31-1951 Initial Comment Caller States nausea, abdominal pain, constipated since yesterday. Nurse Assessment Nurse: Marcelline Deist, RN, Lynda Date/Time (Eastern Time): 05/09/2015 10:46:51 AM Confirm and document reason for call. If symptomatic, describe symptoms. ---Caller States nausea, abdominal pain, constipated since yesterday. Didn't feel well on Monday. After eating at a restaurant yesterday am, did not feel well. Even touching belly is tender to touch. Had lump in throat, then pressure & pain in colon. Had a small BM this am. Not sure if fever. Has the patient traveled out of the country within the last 30 days? ---Not Applicable Does the patient have any new or worsening symptoms? ---Yes Will a triage be completed? ---Yes Related visit to physician within the last 2 weeks? ---No Does the PT have any chronic conditions? (i.e. diabetes, asthma, etc.) ---Yes List chronic conditions. ---arthritis Is this a behavioral health or substance abuse call? ---No Guidelines Guideline Title Affirmed Question Affirmed Notes Final Disposition User Comments Please contact caller if any appts. open up today at office. Nurse is more concerned about the abdominal tenderness that patient is having, although she is more bothered this am by the constant nausea. Referrals GO TO FACILITY UNDECIDED REFERRED TO PCP OFFICE

## 2015-05-09 NOTE — Progress Notes (Signed)
Pre visit review using our clinic review tool, if applicable. No additional management support is needed unless otherwise documented below in the visit note. 

## 2015-05-09 NOTE — Patient Instructions (Signed)
Go to ER if worse 

## 2015-05-09 NOTE — Telephone Encounter (Signed)
ER or OV w/any provider I could work in this pm Thx

## 2015-05-09 NOTE — Telephone Encounter (Signed)
Notified pt with md response. Pt states she doesn't feel bad enough [place] to ER would like to see md this afternoon. Add on @ 3:00...Johny Chess

## 2015-05-09 NOTE — Assessment & Plan Note (Signed)
Labs discussed  

## 2015-05-10 ENCOUNTER — Telehealth: Payer: Self-pay | Admitting: Internal Medicine

## 2015-05-10 NOTE — Telephone Encounter (Signed)
Pt called in and would like to know if you have gotten blood results back.  She would like to know the results  Best number is [phone]

## 2015-05-10 NOTE — Telephone Encounter (Signed)
Pt called back. I let her know that Dr. Camila Li said all her labs were ok

## 2015-05-23 ENCOUNTER — Ambulatory Visit
Admission: RE | Admit: 2015-05-23 | Discharge: 2015-05-23 | Disposition: A | Discharge status: Home / Self Care | Payer: Federal, State, Local not specified - PPO | Source: Ambulatory Visit | Attending: Internal Medicine | Admitting: Internal Medicine

## 2015-06-13 ENCOUNTER — Ambulatory Visit (INDEPENDENT_AMBULATORY_CARE_PROVIDER_SITE_OTHER)
Admission: RE | Admit: 2015-06-13 | Discharge: 2015-06-13 | Disposition: A | Discharge status: Home / Self Care | Payer: Federal, State, Local not specified - PPO | Source: Ambulatory Visit | Attending: Interventional Cardiology | Admitting: Interventional Cardiology

## 2015-06-13 ENCOUNTER — Ambulatory Visit (INDEPENDENT_AMBULATORY_CARE_PROVIDER_SITE_OTHER): Payer: Federal, State, Local not specified - PPO | Admitting: Interventional Cardiology

## 2015-06-13 ENCOUNTER — Encounter: Payer: Self-pay | Admitting: Interventional Cardiology

## 2015-06-13 VITALS — BP 120/74 | HR 72 | Ht 62.0 in | Wt 123.1 lb

## 2015-06-13 DIAGNOSIS — E785 Hyperlipidemia, unspecified: Secondary | ICD-10-CM | POA: Diagnosis not present

## 2015-06-13 DIAGNOSIS — Z8249 Family history of ischemic heart disease and other diseases of the circulatory system: Secondary | ICD-10-CM

## 2015-06-13 DIAGNOSIS — Z87891 Personal history of nicotine dependence: Secondary | ICD-10-CM | POA: Diagnosis not present

## 2015-06-13 NOTE — Progress Notes (Signed)
Cardiology Office Note   Date:  06/13/2015   ID:  Courtney [name], DOB [birth year], MRN [id number]  PCP:  Walker Kehr, MD  Cardiologist:  Sinclair Grooms, MD   Chief Complaint  Patient presents with  . Coronary Artery Disease    Family history  . Hyperlipidemia      History of Present Illness: Courtney [name] is a 65 y.o. female who presents for  Family history of coronary artery disease.   The patient has been frightened by recent occurrences with several friends having heart attacks. She has a significant family history of coronary disease with a 67-year-old her sister having undergone coronary bypass surgery , her mother having undergone coronary bypass surgery, 2 first cousins with multiple stents , and her uncle (father of the 2 cousins) having had prior myocardial infarction. She has no symptoms. She is concerned that she is sitting on a time bomb. She knows that her cholesterol level is high and wonders if something should be done about it. She has requested a cardiology consultation to discuss risk modification.   the patient's most recent lipid panel performed in January 2017 revealed a total cholesterol of 271, HDL of 89 ,  LDL 169 , and triglyceride 68.  Past Medical History  Diagnosis Date  . IBS (irritable bowel syndrome)   . ADD (attention deficit disorder)   . Shoulder pain, right   . Hyperlipidemia   . Osteoarthritis   . Osteoporosis   . Depression     no bipolar per Dr. [name]  . Celiac disease     possible vs IBS  . GI problem     [name]  . Anxiety   . Seasonal allergies 01-08-12    hx. of multiple bronchitis related to this.    Past Surgical History  Procedure Laterality Date  . Total hip arthroplasty      right. 2002  . Tonsillectomy    . Exploratory laparotomy  01-08-12    due to endometriosis-portions of both ovaries removed  . Ventral hernia repair  01-08-12    abdominal  . Total hip arthroplasty  01/14/2012    Procedure:  TOTAL HIP ARTHROPLASTY;  Surgeon: Gearlean Alf, MD;  Location: [facility];  Service: Orthopedics;  Laterality: Left;     Current Outpatient Prescriptions  Medication Sig Dispense Refill  . Cholecalciferol (VITAMIN D3) 2000 UNITS capsule Take 1 capsule (2,000 Units total) by mouth daily. 100 capsule 3  . Coenzyme Q10 (CO Q 10 PO) Take 1 capsule by mouth daily.     . fish oil-omega-3 fatty acids 1000 MG capsule Take by mouth daily.    . Vortioxetine HBr (TRINTELLIX PO) Take 7 mg by mouth daily.     No current facility-administered medications for this visit.    Allergies:   Bactrim; Morphine and related; Ceclor; Clarithromycin; Erythromycin; Levofloxacin; Loratadine; Olanzapine; Propoxyphene n-acetaminophen; and Zithromax    Social History:  The patient  reports that she has never smoked. She has never used smokeless tobacco. She reports that she drinks alcohol. She reports that she does not use illicit drugs.   Family History:  The patient's family history includes Heart attack in her mother; Heart disease in her mother; Prostate cancer in her father.    ROS:  Please see the history of present illness.   Otherwise, review of systems are positive for  Constipation, joint arthritis..   All other systems are reviewed and negative.    PHYSICAL EXAM: VS:  BP 120/74 mmHg  Pulse 72  Ht 5\' 2"  (1.575 m)  Wt 123 lb 1.9 oz (55.847 kg)  BMI 22.51 kg/m2  LMP 01/08/1999 , BMI Body mass index is 22.51 kg/(m^2). GEN: Well nourished, well developed, in no acute distress HEENT: normal Neck: no JVD, carotid bruits, or masses Cardiac: RRR.  There is no murmur, rub, or gallop. There is no edema. Respiratory:  clear to auscultation bilaterally, normal work of breathing. GI: soft, nontender, nondistended, + BS MS: no deformity or atrophy Skin: warm and dry, no rash Neuro:  Strength and sensation are intact Psych: euthymic mood, full affect   EKG:  EKG is ordered today. The ekg reveals  Is  normal.   Recent Labs: 05/04/2015: BUN 13; Creatinine, Ser 0.77; Potassium 4.5; Sodium 140; TSH 2.52 05/09/2015: ALT 12; Hemoglobin 13.9; Platelets 300.0    Lipid Panel    Component Value Date/Time   CHOL 271* 05/04/2015 0751   TRIG 68.0 05/04/2015 0751   HDL 89.10 05/04/2015 0751   CHOLHDL 3 05/04/2015 0751   VLDL 13.6 05/04/2015 0751   LDLCALC 169* 05/04/2015 0751   LDLDIRECT 120.7 08/04/2012 1607      Wt Readings from Last 3 Encounters:  06/13/15 123 lb 1.9 oz (55.847 kg)  05/09/15 123 lb (55.792 kg)  04/20/15 122 lb 12 oz (55.679 kg)      Other studies Reviewed: Additional studies/ records that were reviewed today include:  Reviewed prior lipid panels dating back to 2009.    ASSESSMENT AND PLAN:  1. Hyperlipidemia  She does have elevated LDL which is counteracted by her high HDL. Family history is concerning. Further evaluation is necessary with lipid analysis. She has no idea about the lipid panel in her sister who recently had bypass surgery. - EKG 12-Lead - CT Cardiac Scoring; Future - Cardio IQ (R) Advanced Lipid Panel; Future - Cardio IQ (R) Advanced Lipid Panel  2. Family history of coronary artery disease Tthis is a very strong risk factor in this patient with multiple females including her mother having undergone revascularization. - Vortioxetine HBr (TRINTELLIX PO); Take 7 mg by mouth daily. - EKG 12-Lead - CT Cardiac Scoring; Future - Cardio IQ (R) Advanced Lipid Panel; Future - Cardio IQ (R) Advanced Lipid Panel  3. TOBACCO USE, QUIT  she was never a consistent smoker and merely tried cigarettes as a teenager. - Vortioxetine HBr (TRINTELLIX PO); Take 7 mg by mouth daily. - EKG 12-Lead - CT Cardiac Scoring; Future - Cardio IQ (R) Advanced Lipid Panel; Future - Cardio IQ (R) Advanced Lipid Panel    Current medicines are reviewed at length with the patient today.  The patient has the following concerns regarding medicines: none.  She began taking  omega-3 Fish oral because of her joint problems..  The following changes/actions have been instituted:     Cardio IQ lipid analysis   coronary calcium score   Aspirin 81 mg per day   Maintain an active lifestyle   Considered a myocardial perfusion study if very high calcium score.  Labs/ tests ordered today include:   Orders Placed This Encounter  Procedures  . CT Cardiac Scoring  . Cardio IQ (R) Advanced Lipid Panel  . EKG 12-Lead     Disposition:   FU with HS in 1 year PRN.  Signed, Sinclair Grooms, MD  06/13/2015 5:42 PM    [facility] [address], [place], [place]  [address] Phone: [phone]; Fax: [phone]

## 2015-06-13 NOTE — Patient Instructions (Signed)
Medication Instructions:  Your physician recommends that you continue on your current medications as directed. Please refer to the Current Medication list given to you today.   Labwork: Cardio IQ today  Testing/Procedures: Cardiac CT Score  Follow-Up: Your physician recommends that you schedule a follow-up appointment pending results   Any Other Special Instructions Will Be Listed Below (If Applicable).     If you need a refill on your cardiac medications before your next appointment, please call your pharmacy.

## 2015-06-14 ENCOUNTER — Telehealth: Payer: Self-pay | Admitting: Interventional Cardiology

## 2015-06-14 DIAGNOSIS — E785 Hyperlipidemia, unspecified: Secondary | ICD-10-CM

## 2015-06-14 MED ORDER — ROSUVASTATIN CALCIUM 20 MG PO TABS: 20.0000 mg | ORAL_TABLET | Freq: Every day | ORAL | Status: DC

## 2015-06-14 NOTE — Telephone Encounter (Signed)
NeW Message  Pt calling to speak w/ RN concernin gCT results. Please call back and discuss.

## 2015-06-14 NOTE — Telephone Encounter (Signed)
Pt aware of her cardiac CT results and Dr.Smith's recommendation. Start Crestor 20 mg daily. Liver and lipid panel in 6-8 weeks Rx sent to pt pharmacy. Recall in Epic for fasting lipid and alt. Adv pt that we we will call back when her cardio ig lab results become available. Pt agreeable and verbalized understanding.

## 2015-06-16 LAB — CARDIO IQ(R) ADVANCED LIPID PANEL
Apolipoprotein B: 99 mg/dL (ref 49–103)
Cholesterol, Total: 259 mg/dL — ABNORMAL HIGH (ref 125–200)
Cholesterol/HDL Ratio: 2.5 calc (ref <=5.0)
HDL Cholesterol: 103 mg/dL (ref >=46)
LDL Large: 8469 nmol/L (ref 5038–17886)
LDL Medium: 218 nmol/L (ref 121–397)
LDL Particle Number: 1337 nmol/L (ref 1016–2185)
LDL Peak Size: 229.9 Angstrom (ref >=218.2)
LDL Small: 165 nmol/L (ref 115–386)
LDL, Calculated: 136 mg/dL — ABNORMAL HIGH
Lipoprotein (a): 10 nmol/L (ref <75)
Non-HDL Cholesterol: 156 mg/dL
Triglycerides: 100 mg/dL

## 2015-06-20 ENCOUNTER — Telehealth: Payer: Self-pay | Admitting: Interventional Cardiology

## 2015-06-20 NOTE — Telephone Encounter (Signed)
New message ° ° ° ° °Returning a call to the nurse °

## 2015-06-21 NOTE — Telephone Encounter (Signed)
Pt aware of lab results and Dr.Smith's recommendation. Pt verbalized understanding.  Pt rqst a copy of he lab results and info on a heart healthy diet be mailed to her.

## 2015-09-07 ENCOUNTER — Other Ambulatory Visit (INDEPENDENT_AMBULATORY_CARE_PROVIDER_SITE_OTHER): Payer: Federal, State, Local not specified - PPO | Admitting: *Deleted

## 2015-09-07 DIAGNOSIS — E785 Hyperlipidemia, unspecified: Secondary | ICD-10-CM

## 2015-09-07 LAB — LIPID PANEL
Cholesterol: 189 mg/dL (ref 125–200)
HDL: 128 mg/dL (ref >=46)
LDL Cholesterol: 52 mg/dL (ref <130)
Total CHOL/HDL Ratio: 1.5 Ratio (ref <=5.0)
Triglycerides: 46 mg/dL (ref <150)
VLDL: 9 mg/dL (ref <30)

## 2015-09-07 LAB — ALT: ALT: 18 U/L (ref 6–29)

## 2015-09-07 NOTE — Addendum Note (Signed)
Addended by: Eulis Foster on: 09/07/2015 07:44 AM   Modules accepted: Orders

## 2015-09-11 ENCOUNTER — Other Ambulatory Visit (INDEPENDENT_AMBULATORY_CARE_PROVIDER_SITE_OTHER): Payer: Federal, State, Local not specified - PPO

## 2015-09-11 ENCOUNTER — Telehealth: Payer: Self-pay | Admitting: Internal Medicine

## 2015-09-11 ENCOUNTER — Telehealth: Payer: Self-pay

## 2015-09-11 ENCOUNTER — Encounter: Payer: Self-pay | Admitting: Internal Medicine

## 2015-09-11 ENCOUNTER — Ambulatory Visit (INDEPENDENT_AMBULATORY_CARE_PROVIDER_SITE_OTHER): Payer: Federal, State, Local not specified - PPO | Admitting: Internal Medicine

## 2015-09-11 VITALS — BP 98/64 | HR 84 | Temp 98.0°F | Wt 125.0 lb

## 2015-09-11 DIAGNOSIS — N390 Urinary tract infection, site not specified: Secondary | ICD-10-CM

## 2015-09-11 LAB — URINALYSIS, ROUTINE W REFLEX MICROSCOPIC
Bilirubin Urine: NEGATIVE
Ketones, ur: NEGATIVE
Nitrite: NEGATIVE
Specific Gravity, Urine: 1.01 (ref 1.000–1.030)
Total Protein, Urine: NEGATIVE
Urine Glucose: NEGATIVE
Urobilinogen, UA: 0.2 (ref 0.0–1.0)
pH: 7.5 (ref 5.0–8.0)

## 2015-09-11 MED ORDER — PHENAZOPYRIDINE HCL 100 MG PO TABS: 100.0000 mg | ORAL_TABLET | Freq: Three times a day (TID) | ORAL | Status: DC | PRN

## 2015-09-11 MED ORDER — NITROFURANTOIN MONOHYD MACRO 100 MG PO CAPS: 100.0000 mg | ORAL_CAPSULE | Freq: Two times a day (BID) | ORAL | Status: DC

## 2015-09-11 MED ORDER — ONDANSETRON HCL 4 MG PO TABS: 4.0000 mg | ORAL_TABLET | Freq: Three times a day (TID) | ORAL | Status: DC | PRN

## 2015-09-11 NOTE — Telephone Encounter (Signed)
[facility] health called [facility] physicians line and states that the pt called and said she has a severe UTI and refused going to the ED per team health request. Pt requests an antibiotic to be sent to pharmacy. Please advise.

## 2015-09-11 NOTE — Telephone Encounter (Signed)
°  Patient Name: Courtney [name] GG DOB: [birth year] Initial Comment Caller would like to be seen- severe UTINurse Assessment Nurse: Roosvelt Maser, RN, Barnetta Chapel Date/Time (Eastern Time): 09/11/2015 9:07:59 AM Confirm and document reason for call. If symptomatic, describe symptoms. You must click the next button to save text entered. ---caller states she has a UTI symptoms started last night, painful urination, back pain, urgency and frequency, no fever that she knows of. Has the patient traveled out of the country within the last 30 days? ---Not Applicable Does the patient have any new or worsening symptoms? ---Yes Will a triage be completed? ---Yes Related visit to physician within the last 2 weeks? ---No Does the PT have any chronic conditions? (i.e. diabetes, asthma, etc.) ---Yes List chronic conditions. ---RA Is this a behavioral health or substance abuse call? ---No Guidelines Guideline Title Affirmed Question Affirmed Notes Urinary Symptoms [1] Unable to urinate (or only a few drops) > 4 hours AND [2] bladder feels very full (e.g., palpable bladder or strong urge to urinate) Final Disposition User Go to ED Now Roosvelt Maser, RN, Barnetta Chapel Comments caller states she has had bad experiences with UC/ER and wants to be seen in the office, no appts were left for today at her office and she would like a call back. Referrals GO TO FACILITY REFUSED Disagree/Comply: Disagree Disagree/Comply Reason: Wait and see

## 2015-09-11 NOTE — Progress Notes (Signed)
Pre visit review using our clinic review tool, if applicable. No additional management support is needed unless otherwise documented below in the visit note. 

## 2015-09-11 NOTE — Progress Notes (Signed)
Subjective:  Patient ID: Courtney [name], female    DOB: 1950-06-01  Age: 65 y.o. MRN: [id number]  CC: No chief complaint on file.   HPI Courtney [name] presents for severe bladder pain and difficulty urinating w/pain. Pt had LBP x 1-2 d prior. She could not sleep...  Outpatient Prescriptions Prior to Visit  Medication Sig Dispense Refill  . Cholecalciferol (VITAMIN D3) 2000 UNITS capsule Take 1 capsule (2,000 Units total) by mouth daily. 100 capsule 3  . Coenzyme Q10 (CO Q 10 PO) Take 1 capsule by mouth daily.     . fish oil-omega-3 fatty acids 1000 MG capsule Take by mouth daily.    . rosuvastatin (CRESTOR) 20 MG tablet Take 1 tablet (20 mg total) by mouth daily. 30 tablet 11  . Vortioxetine HBr (TRINTELLIX PO) Take 7 mg by mouth daily.     No facility-administered medications prior to visit.    ROS Review of Systems  Constitutional: Negative for fever, chills, activity change, appetite change, fatigue and unexpected weight change.  HENT: Negative for congestion, mouth sores and sinus pressure.   Eyes: Negative for visual disturbance.  Respiratory: Negative for cough and chest tightness.   Gastrointestinal: Negative for nausea and abdominal pain.  Genitourinary: Positive for dysuria, frequency, flank pain and decreased urine volume. Negative for urgency, vaginal discharge, difficulty urinating and vaginal pain.  Musculoskeletal: Negative for back pain and gait problem.  Skin: Negative for pallor and rash.  Neurological: Negative for dizziness, tremors, weakness, numbness and headaches.  Psychiatric/Behavioral: Negative for confusion and sleep disturbance.    Objective:  BP 98/64 mmHg  Pulse 84  Temp(Src) 98 F (36.7 C) (Oral)  Wt 125 lb (56.7 kg)  SpO2 97%  LMP 01/08/1999  BP Readings from Last 3 Encounters:  09/11/15 98/64  06/13/15 120/74  05/09/15 120/72    Wt Readings from Last 3 Encounters:  09/11/15 125 lb (56.7 kg)  06/13/15 123 lb 1.9 oz  (55.847 kg)  05/09/15 123 lb (55.792 kg)    Physical Exam  Constitutional: She appears well-developed. No distress.  HENT:  Head: Normocephalic.  Right Ear: External ear normal.  Left Ear: External ear normal.  Nose: Nose normal.  Mouth/Throat: Oropharynx is clear and moist.  Eyes: Conjunctivae are normal. Pupils are equal, round, and reactive to light. Right eye exhibits no discharge. Left eye exhibits no discharge.  Neck: Normal range of motion. Neck supple. No JVD present. No tracheal deviation present. No thyromegaly present.  Cardiovascular: Normal rate, regular rhythm and normal heart sounds.   Pulmonary/Chest: No stridor. No respiratory distress. She has no wheezes.  Abdominal: Soft. Bowel sounds are normal. She exhibits no distension and no mass. There is no tenderness. There is no rebound and no guarding.  Musculoskeletal: She exhibits no edema or tenderness.  Lymphadenopathy:    She has no cervical adenopathy.  Neurological: She displays normal reflexes. No cranial nerve deficit. She exhibits normal muscle tone. Coordination normal.  Skin: No rash noted. No erythema.  Psychiatric: She has a normal mood and affect. Her behavior is normal. Judgment and thought content normal.  sensitive suprapubic area  Lab Results  Component Value Date   WBC 6.0 05/09/2015   HGB 13.9 05/09/2015   HCT 41.8 05/09/2015   PLT 300.0 05/09/2015   GLUCOSE 88 05/04/2015   CHOL 189 09/07/2015   TRIG 46 09/07/2015   HDL 128 09/07/2015   LDLDIRECT 120.7 08/04/2012   LDLCALC 52 09/07/2015   ALT 18 09/07/2015  AST 18 05/09/2015   NA 140 05/04/2015   K 4.5 05/04/2015   CL 103 05/04/2015   CREATININE 0.77 05/04/2015   BUN 13 05/04/2015   CO2 29 05/04/2015   TSH 2.52 05/04/2015   INR 0.96 01/08/2012   HGBA1C 5.6 10/02/2011    Ct Cardiac Scoring  06/13/2015  ADDENDUM REPORT: 06/13/2015 19:36 CLINICAL DATA:  Risk stratification EXAM: Coronary Calcium Score TECHNIQUE: The patient was scanned  on a Siemens Sensation 16 slice scanner. Axial non-contrast 3 mm slices were carried out through the heart. The data set was analyzed on a dedicated work station and scored using the [place]. FINDINGS: Non-cardiac: See separate report from [facility]. Ascending Aorta:  Normal caliber. Pericardium: Normal. Coronary arteries:  Normal origin. IMPRESSION: Coronary calcium score of 209. This was 12 percentile for age and sex matched control. Ena Dawley Electronically Signed   By: Ena Dawley   On: 06/13/2015 19:36  06/13/2015  EXAM: OVER-READ INTERPRETATION  CT CHEST The following report is an over-read performed by radiologist Dr. Laurence Ferrari [facility] on 06/13/2015. This over-read does not include interpretation of cardiac or coronary anatomy or pathology. The coronary calcium interpretation by the cardiologist is attached. COMPARISON:  Report from 06/03/2000 FINDINGS: Mediastinum/Nodes: Unremarkable Lungs/Pleura: 3 mm right lower lobe pulmonary nodule, image 24 series 3. 3 mm subpleural nodule in the right middle lobe, image 13 series 3. 4 mm right middle lobe nodule suspected on image 23 series 3. 2-3 mm right lower lobe pulmonary nodule, image 27 series 3. 4 by 3 mm left lower lobe pulmonary nodule, image 36 series 3. 3 mm pulmonary nodule in the left lower lobe anteriorly on image 24 series 3. Upper abdomen: Unremarkable Musculoskeletal: Mild spondylosis in the visualized thoracic spine. IMPRESSION: 1. Small bibasilar pulmonary nodules in the 3-4 mm range. No prior cross-sectional imaging studies through this region are available for comparison. If the patient is at high risk for bronchogenic carcinoma, follow-up chest CT at 1 year is recommended. If the patient is at low risk, no follow-up is needed. This recommendation follows the consensus statement: Guidelines for Management of Small Pulmonary Nodules Detected on CT Scans: A Statement from the [place]  as published in Radiology 2005; 237:395-400. Electronically Signed: By: Van Clines M.D. On: 06/13/2015 16:59    Assessment & Plan:   There are no diagnoses linked to this encounter. I am having [name] maintain her fish oil-omega-3 fatty acids, Coenzyme Q10 (CO Q 10 PO), Vitamin D3, Vortioxetine HBr (TRINTELLIX PO), rosuvastatin, and meloxicam.  Meds ordered this encounter  Medications  . meloxicam (MOBIC) 15 MG tablet    Sig: Take 1 tablet by mouth daily.    Refill:  1     Follow-up: No Follow-up on file.  Walker Kehr, MD

## 2015-09-11 NOTE — Telephone Encounter (Signed)
Seen today. 

## 2015-09-11 NOTE — Assessment & Plan Note (Addendum)
Macrobid po AZO UA and Cx

## 2015-09-13 ENCOUNTER — Telehealth: Payer: Self-pay | Admitting: Internal Medicine

## 2015-09-13 NOTE — Telephone Encounter (Signed)
Patient states she has started medication that Dr. Camila Li gave her for UTI.  Patient states she is feeling some better but she is still having a lot of bladder pain.  She would like to know what she needs to do in regards.

## 2015-09-13 NOTE — Telephone Encounter (Signed)
Patient has called back in regards  °

## 2015-09-13 NOTE — Telephone Encounter (Signed)
Patient states she is going into a meeting at [place] if you call and she does not answer to leave her a voice mail.

## 2015-09-13 NOTE — Telephone Encounter (Signed)
Can take Pyridium 200 mg tid prn Use Tylenol 2 tabs or Advil 2 tabs qid prn Thx

## 2015-09-14 LAB — CULTURE, URINE COMPREHENSIVE: Colony Count: >=100000

## 2015-09-14 NOTE — Telephone Encounter (Signed)
Pt informed

## 2015-10-04 ENCOUNTER — Ambulatory Visit (INDEPENDENT_AMBULATORY_CARE_PROVIDER_SITE_OTHER): Payer: Federal, State, Local not specified - PPO | Admitting: Family Medicine

## 2015-10-04 ENCOUNTER — Ambulatory Visit: Payer: Federal, State, Local not specified - PPO | Admitting: Family

## 2015-10-04 ENCOUNTER — Encounter: Payer: Self-pay | Admitting: Family Medicine

## 2015-10-04 VITALS — BP 128/82 | HR 79 | Temp 98.3°F | Ht 62.0 in | Wt 123.0 lb

## 2015-10-04 DIAGNOSIS — R300 Dysuria: Secondary | ICD-10-CM | POA: Diagnosis not present

## 2015-10-04 DIAGNOSIS — N3001 Acute cystitis with hematuria: Secondary | ICD-10-CM | POA: Diagnosis not present

## 2015-10-04 DIAGNOSIS — Z87440 Personal history of urinary (tract) infections: Secondary | ICD-10-CM | POA: Diagnosis not present

## 2015-10-04 LAB — POC URINALSYSI DIPSTICK (AUTOMATED)
Bilirubin, UA: NEGATIVE
Glucose, UA: NEGATIVE
Ketones, UA: NEGATIVE
Nitrite, UA: NEGATIVE
Protein, UA: NEGATIVE
Spec Grav, UA: 1.01
Urobilinogen, UA: 0.2
pH, UA: 7

## 2015-10-04 MED ORDER — AMOXICILLIN-POT CLAVULANATE 500-125 MG PO TABS: 1.0000 | ORAL_TABLET | Freq: Two times a day (BID) | ORAL | Status: DC

## 2015-10-04 NOTE — Patient Instructions (Signed)
UTI/cystitis  Treat with 10 days of augmentin unless unable to tolerate or culture grows different bacteria resistant to this  If fever, chills, worsening symptoms- please seek care tomorrow or over weekend

## 2015-10-04 NOTE — Progress Notes (Signed)
Subjective:  Courtney [name] is a 65 y.o. year old very pleasant female patient who presents for/with See problem oriented charting ROS- no fever, chills, nausea, vomiting. No back pain- does have some lower abdominal pain. .see any ROS included in HPI as well.   Past Medical History-  Patient Active Problem List   Diagnosis Date Noted  . Acute UTI 09/11/2015  . Abdominal pain, epigastric 05/09/2015  . Dry skin dermatitis 08/04/2012  . Other malaise and fatigue 08/04/2012  . Constipation - functional 08/04/2012  . History of hypothyroidism 08/04/2012  . Family history of coronary artery disease 05/25/2012  . OA (osteoarthritis) of hip 01/14/2012  . Rash 11/03/2011  . Well adult exam 10/02/2011  . Counseling for travel 03/26/2011  . Cerumen impaction 12/25/2010  . Otitis media, serous 12/25/2010  . OSTEOARTHRITIS, HIPS, BILATERAL 06/20/2010  . HIP PAIN 06/05/2010  . TACHYCARDIA 06/05/2010  . ELEVATED BLOOD PRESSURE 06/05/2010  . ANXIETY 05/03/2009  . WEIGHT LOSS 05/03/2009  . TOBACCO USE, QUIT 05/03/2009  . ONYCHOMYCOSIS 06/28/2008  . Actinic keratosis 06/28/2008  . INGROWN NAIL 06/28/2008  . CAROTID BRUIT 06/28/2008  . MENOPAUSAL SYNDROME 12/28/2007  . A D D 05/17/2007  . Irritable bowel syndrome 05/17/2007  . Dyslipidemia 02/28/2007  . DEPRESSION 02/28/2007  . OSTEOARTHRITIS 02/28/2007  . OSTEOPOROSIS 02/28/2007  . SHOULDER PAIN 02/16/2007    Medications- reviewed and updated Current Outpatient Prescriptions  Medication Sig Dispense Refill  . Cholecalciferol (VITAMIN D3) 2000 UNITS capsule Take 1 capsule (2,000 Units total) by mouth daily. 100 capsule 3  . Coenzyme Q10 (CO Q 10 PO) Take 1 capsule by mouth daily.     . fish oil-omega-3 fatty acids 1000 MG capsule Take by mouth daily.    . meloxicam (MOBIC) 15 MG tablet Take 1 tablet by mouth daily.  1  . rosuvastatin (CRESTOR) 20 MG tablet Take 1 tablet (20 mg total) by mouth daily. 30 tablet 11  . Vortioxetine  HBr (TRINTELLIX PO) Take 7 mg by mouth daily.    [name] amoxicillin-clavulanate (AUGMENTIN) 500-125 MG tablet Take 1 tablet (500 mg total) by mouth 2 (two) times daily with a meal. 20 tablet 0   No current facility-administered medications for this visit.    Objective: BP 128/82 mmHg  Pulse 79  Temp(Src) 98.3 F (36.8 C) (Oral)  Ht 5\' 2"  (1.575 m)  Wt 123 lb (55.792 kg)  BMI 22.49 kg/m2  SpO2 99%  LMP 01/08/1999 Gen: NAD, resting comfortably CV: RRR no murmurs rubs or gallops Lungs: CTAB no crackles, wheeze, rhonchi Abdomen: soft/tender in lower abdomen/nondistended/normal bowel sounds. No rebound or guarding.  Ext: no edema Skin: warm, dry, no rash over abdomen  Results for orders placed or performed in visit on 10/04/15 (from the past 24 hour(s))  POCT Urinalysis Dipstick (Automated)     Status: Abnormal   Collection Time: 10/04/15  4:42 PM  Result Value Ref Range   Color, UA yellow    Clarity, UA clear    Glucose, UA n    Bilirubin, UA n    Ketones, UA n    Spec Grav, UA 1.010    Blood, UA trace-intact    pH, UA 7.0    Protein, UA n    Urobilinogen, UA 0.2    Nitrite, UA n    Leukocytes, UA Trace (A) Negative    Assessment/Plan:  UTI S: 3 weeks ago treated for e coli UTI for 7 days with nitrofurantoin, symptoms slowly but completely went away.  Seemed to haappen after prednisone. She had another steroid injection with Dr. Nelva Bush and then 2 weeks later symptoms restarted. Felt a twinge of pain 2 days ago and then last night had hesitancy at night and feels spasm- when finally urinated it burned. Heating pad on abdomen helped with lower abdominal pain. She was concerned she had not been on right antibiotic A/P: we reviewed all antibiotics that she was sensitive to. The only 2 options she did not report nausea to that class or that were not IV remaining was augmentin. Prior E. Coli- suspicion this could also be the same. Will treat with augmentin for 10 days- extended due to  recent infection within a month. Get urine culture- UA not overly impressive.  Return precautions advised.   Orders Placed This Encounter  Procedures  . Urine culture  . POCT Urinalysis Dipstick (Automated)    Meds ordered this encounter  Medications  . amoxicillin-clavulanate (AUGMENTIN) 500-125 MG tablet    Sig: Take 1 tablet (500 mg total) by mouth 2 (two) times daily with a meal.    Dispense:  20 tablet    Refill:  0  recurrent issue- worsening with med management.   Garret Reddish, MD

## 2015-10-04 NOTE — Progress Notes (Signed)
Pre visit review using our clinic review tool, if applicable. No additional management support is needed unless otherwise documented below in the visit note. 

## 2015-10-07 LAB — URINE CULTURE: Colony Count: >=100000

## 2015-10-17 ENCOUNTER — Telehealth: Payer: Self-pay | Admitting: Internal Medicine

## 2015-10-17 NOTE — Telephone Encounter (Signed)
Dr Ellene Route, Dr Joselyn Glassman

## 2015-10-17 NOTE — Telephone Encounter (Signed)
Patient would like to know who Dr. Alain Marion would recommend for spinal surgery.

## 2015-10-18 NOTE — Telephone Encounter (Signed)
Pt informed

## 2015-10-19 ENCOUNTER — Telehealth: Payer: Self-pay | Admitting: Internal Medicine

## 2015-10-19 DIAGNOSIS — M545 Low back pain, unspecified: Secondary | ICD-10-CM

## 2015-10-19 DIAGNOSIS — M79604 Pain in right leg: Secondary | ICD-10-CM

## 2015-10-19 DIAGNOSIS — M79605 Pain in left leg: Secondary | ICD-10-CM

## 2015-10-19 NOTE — Telephone Encounter (Signed)
Pt called in and said she needs a referral to Earleen Newport MD  She is needing a 2nd option

## 2015-10-20 NOTE — Telephone Encounter (Signed)
Ok Thx 

## 2015-12-11 ENCOUNTER — Ambulatory Visit (INDEPENDENT_AMBULATORY_CARE_PROVIDER_SITE_OTHER): Payer: Federal, State, Local not specified - PPO | Admitting: Interventional Cardiology

## 2015-12-11 ENCOUNTER — Encounter: Payer: Self-pay | Admitting: Interventional Cardiology

## 2015-12-11 ENCOUNTER — Encounter (INDEPENDENT_AMBULATORY_CARE_PROVIDER_SITE_OTHER): Payer: Self-pay

## 2015-12-11 VITALS — BP 100/50 | HR 60 | Wt 128.0 lb

## 2015-12-11 DIAGNOSIS — Z87891 Personal history of nicotine dependence: Secondary | ICD-10-CM

## 2015-12-11 DIAGNOSIS — E785 Hyperlipidemia, unspecified: Secondary | ICD-10-CM | POA: Diagnosis not present

## 2015-12-11 DIAGNOSIS — R0989 Other specified symptoms and signs involving the circulatory and respiratory systems: Secondary | ICD-10-CM | POA: Diagnosis not present

## 2015-12-11 LAB — LIPID PANEL
Cholesterol: 167 mg/dL (ref 125–200)
HDL: 105 mg/dL (ref >=46)
LDL Cholesterol: 53 mg/dL (ref <130)
Total CHOL/HDL Ratio: 1.6 Ratio (ref <=5.0)
Triglycerides: 44 mg/dL (ref <150)
VLDL: 9 mg/dL (ref <30)

## 2015-12-11 LAB — HEPATIC FUNCTION PANEL
ALT: 15 U/L (ref 6–29)
AST: 20 U/L (ref 10–35)
Albumin: 4.1 g/dL (ref 3.6–5.1)
Alkaline Phosphatase: 34 U/L (ref 33–130)
Bilirubin, Direct: 0.1 mg/dL (ref <=0.2)
Indirect Bilirubin: 0.3 mg/dL (ref 0.2–1.2)
Total Bilirubin: 0.4 mg/dL (ref 0.2–1.2)
Total Protein: 6.3 g/dL (ref 6.1–8.1)

## 2015-12-11 NOTE — Progress Notes (Signed)
Cardiology Office Note    Date:  12/11/2015   ID:  ALESSI WARR, DOB [birth year], MRN [id number]  PCP:  Walker Kehr, MD  Cardiologist: Sinclair Grooms, MD   Chief Complaint  Patient presents with  . Follow-up    Lipids and FH CAD    History of Present Illness:  Courtney [name] is a 65 y.o. female follow-up of strong family history CAD and hyperlipidemia.  LDL cholesterol documented to be greater than 160 in early 2017. Since starting Crestor musculoskeletal discomfort particularly in the knees has become an issue. Crestor 20 mg per day was started and LDL has decreased to 52 by lab work in May.  Coronary artery calcification has been identified by chest CT. There is a strong family history of coronary disease.    Past Medical History:  Diagnosis Date  . ADD (attention deficit disorder)   . Anxiety   . Celiac disease    possible vs IBS  . Depression    no bipolar per Dr. [name]  . GI problem    [name]  . Hyperlipidemia   . IBS (irritable bowel syndrome)   . Osteoarthritis   . Osteoporosis   . Seasonal allergies 01-08-12   hx. of multiple bronchitis related to this.  . Shoulder pain, right     Past Surgical History:  Procedure Laterality Date  . EXPLORATORY LAPAROTOMY  01-08-12   due to endometriosis-portions of both ovaries removed  . TONSILLECTOMY    . TOTAL HIP ARTHROPLASTY     right. 2002  . TOTAL HIP ARTHROPLASTY  01/14/2012   Procedure: TOTAL HIP ARTHROPLASTY;  Surgeon: Gearlean Alf, MD;  Location: [facility];  Service: Orthopedics;  Laterality: Left;  [name] VENTRAL HERNIA REPAIR  01-08-12   abdominal    Current Medications: Outpatient Medications Prior to Visit  Medication Sig Dispense Refill  . Cholecalciferol (VITAMIN D3) 2000 UNITS capsule Take 1 capsule (2,000 Units total) by mouth daily. 100 capsule 3  . Coenzyme Q10 (CO Q 10 PO) Take 1 capsule by mouth daily.     . fish oil-omega-3 fatty acids 1000 MG capsule Take by mouth daily.     . meloxicam (MOBIC) 15 MG tablet Take 1 tablet by mouth daily.  1  . rosuvastatin (CRESTOR) 20 MG tablet Take 1 tablet (20 mg total) by mouth daily. 30 tablet 11  . Vortioxetine HBr (TRINTELLIX PO) Take 7 mg by mouth daily.    [name] amoxicillin-clavulanate (AUGMENTIN) 500-125 MG tablet Take 1 tablet (500 mg total) by mouth 2 (two) times daily with a meal. 20 tablet 0   No facility-administered medications prior to visit.      Allergies:   Bactrim [sulfamethoxazole-trimethoprim]; Morphine; Morphine and related; Ceclor [cefaclor]; Clarithromycin; Erythromycin; Levofloxacin; Loratadine; Nitrofurantoin; Olanzapine; Propoxyphene; Propoxyphene n-acetaminophen; and Zithromax [azithromycin]   Social History   Social History  . Marital status: Married    Spouse name: N/A  . Number of children: N/A  . Years of education: N/A   Occupational History  . Futures trader    Social History Main Topics  . Smoking status: Never Smoker  . Smokeless tobacco: Never Used     Comment: only social  . Alcohol use 0.0 oz/week     Comment: rare social   . Drug use: No  . Sexual activity: Yes   Other Topics Concern  . None   Social History Narrative  . None     Family History:  The patient's family history includes  Heart attack in her mother; Heart disease in her mother; Prostate cancer in her father.   ROS:   Please see the history of present illness.    Constipation joint swelling. Easy bruising. History of arthritis.  All other systems reviewed and are negative.   PHYSICAL EXAM:   VS:  BP (!) 100/50   Pulse 60   Wt 128 lb (58.1 kg)   LMP 01/08/1999   BMI 23.41 kg/m    GEN: Well nourished, well developed, in no acute distress  HEENT: normal  Neck: no JVD, carotid bruits, or masses Cardiac: RRR; no murmurs, rubs, or gallops,no edema  Respiratory:  clear to auscultation bilaterally, normal work of breathing GI: soft, nontender, nondistended, + BS MS: no deformity or atrophy  Skin:  warm and dry, no rash Neuro:  Alert and Oriented x 3, Strength and sensation are intact Psych: euthymic mood, full affect  Wt Readings from Last 3 Encounters:  12/11/15 128 lb (58.1 kg)  10/04/15 123 lb (55.8 kg)  09/11/15 125 lb (56.7 kg)      Studies/Labs Reviewed:   EKG:  EKG  None   Recent Labs: 05/04/2015: BUN 13; Creatinine, Ser 0.77; Potassium 4.5; Sodium 140; TSH 2.52 05/09/2015: Hemoglobin 13.9; Platelets 300.0 09/07/2015: ALT 18   Lipid Panel    Component Value Date/Time   CHOL 189 09/07/2015 0744   CHOL 259 (H) 06/13/2015 1700   TRIG 46 09/07/2015 0744   TRIG 100 06/13/2015 1700   HDL 128 09/07/2015 0744   HDL 103 06/13/2015 1700   CHOLHDL 1.5 09/07/2015 0744   VLDL 9 09/07/2015 0744   LDLCALC 52 09/07/2015 0744   LDLCALC 136 (H) 06/13/2015 1700   LDLDIRECT 120.7 08/04/2012 1607    Additional studies/ records that were reviewed today include:  Lipid level pending.    ASSESSMENT:    1. Dyslipidemia   2. Bilateral carotid bruits   3. TOBACCO USE, QUIT      PLAN:  In order of problems listed above:  1. Repeat lipid profile today. Discontinue Crestor for one month to see if articular complaints improve. Downward adjustment in Crestor dose based upon today's laboratory data. Continue aspirin 81 mg per day.. Based upon the Crestor dose, will determine when neck sport work needs to be done. May be 6 months from now maybe earlier depending upon findings.    Medication Adjustments/Labs and Tests Ordered: Current medicines are reviewed at length with the patient today.  Concerns regarding medicines are outlined above.  Medication changes, Labs and Tests ordered today are listed in the Patient Instructions below. There are no Patient Instructions on file for this visit.   Signed, Sinclair Grooms, MD  12/11/2015 8:02 AM    [facility] [address], [place], [place]  [address] Phone: [phone]; Fax: [phone]

## 2015-12-11 NOTE — Addendum Note (Signed)
Addended by: Katina Dung on: 12/11/2015 08:25 AM   Modules accepted: Orders

## 2015-12-11 NOTE — Patient Instructions (Signed)
Medication Instructions:   STOP CRESTOR X ONE MONTH THEN CALL  Labwork:  Your physician recommends that you HAVE LAB WORK TODAY  Follow-Up:  Your physician wants you to follow-up in: [place] will receive a reminder letter in the mail two months in advance. If you don't receive a letter, please call our office to schedule the follow-up appointment.   If you need a refill on your cardiac medications before your next appointment, please call your pharmacy.

## 2015-12-13 DIAGNOSIS — M419 Scoliosis, unspecified: Secondary | ICD-10-CM | POA: Insufficient documentation

## 2015-12-20 ENCOUNTER — Telehealth: Payer: Self-pay | Admitting: Interventional Cardiology

## 2015-12-20 DIAGNOSIS — E785 Hyperlipidemia, unspecified: Secondary | ICD-10-CM

## 2015-12-20 MED ORDER — ROSUVASTATIN CALCIUM 5 MG PO TABS: 5.0000 mg | ORAL_TABLET | Freq: Every day | ORAL | 11 refills | Status: DC

## 2015-12-20 NOTE — Telephone Encounter (Signed)
-----   Message from Belva Crome, MD sent at 12/11/2015  7:22 PM EDT ----- Let the patient know lipids and liver tests are nearly identical to before. Actually the cholesterol levels are slightly lower. Plan to continue with plan to hold Crestor for one month and then resume at 5 mg per day. Repeat liver and lipid panel 4-6 months after resumption of therapy. A copy will be sent to Walker Kehr, MD

## 2015-12-20 NOTE — Telephone Encounter (Signed)
Pt aware of lab results and Dr.Smith's recommendation. Let the patient know lipids and liver tests are nearly identical to before. Actually the cholesterol levels are slightly lower. Plan to continue with plan to hold Crestor for one month and then resume at 5 mg per day. Repeat liver and lipid panel 4-6 months after resumption of therapy.  A copy will be sent to Walker Kehr, MD   Rx sent to pt pharmacy for Crestor 5mg  daily. Fasting lab appt scheduled for 05/06/16. Pt aware and verbalized understanding.

## 2015-12-20 NOTE — Telephone Encounter (Signed)
New message ° ° ° ° °Returning a call to the nurse to get lab results °

## 2016-03-28 ENCOUNTER — Ambulatory Visit: Payer: Federal, State, Local not specified - PPO

## 2016-04-16 ENCOUNTER — Ambulatory Visit (INDEPENDENT_AMBULATORY_CARE_PROVIDER_SITE_OTHER): Payer: Federal, State, Local not specified - PPO

## 2016-04-16 DIAGNOSIS — Z23 Encounter for immunization: Secondary | ICD-10-CM

## 2016-05-06 ENCOUNTER — Other Ambulatory Visit: Payer: Federal, State, Local not specified - PPO

## 2016-05-08 ENCOUNTER — Other Ambulatory Visit: Payer: Federal, State, Local not specified - PPO

## 2016-05-16 ENCOUNTER — Other Ambulatory Visit: Payer: Federal, State, Local not specified - PPO

## 2016-06-06 ENCOUNTER — Other Ambulatory Visit: Payer: Federal, State, Local not specified - PPO | Admitting: *Deleted

## 2016-06-06 DIAGNOSIS — E785 Hyperlipidemia, unspecified: Secondary | ICD-10-CM

## 2016-06-06 LAB — HEPATIC FUNCTION PANEL
ALT: 12 IU/L (ref 0–32)
AST: 19 IU/L (ref 0–40)
Albumin: 4.4 g/dL (ref 3.6–4.8)
Alkaline Phosphatase: 42 IU/L (ref 39–117)
Bilirubin Total: 0.3 mg/dL (ref 0.0–1.2)
Bilirubin, Direct: 0.12 mg/dL (ref 0.00–0.40)
Total Protein: 6.6 g/dL (ref 6.0–8.5)

## 2016-06-06 LAB — LIPID PANEL
Chol/HDL Ratio: 2.4 ratio units (ref 0.0–4.4)
Cholesterol, Total: 260 mg/dL — ABNORMAL HIGH (ref 100–199)
HDL: 109 mg/dL (ref >39)
LDL Calculated: 139 mg/dL — ABNORMAL HIGH (ref 0–99)
Triglycerides: 62 mg/dL (ref 0–149)
VLDL Cholesterol Cal: 12 mg/dL (ref 5–40)

## 2016-06-06 NOTE — Addendum Note (Signed)
Addended by: Eulis Foster on: 06/06/2016 07:59 AM   Modules accepted: Orders

## 2016-06-26 DIAGNOSIS — M75121 Complete rotator cuff tear or rupture of right shoulder, not specified as traumatic: Secondary | ICD-10-CM | POA: Insufficient documentation

## 2016-06-26 DIAGNOSIS — M5410 Radiculopathy, site unspecified: Secondary | ICD-10-CM | POA: Insufficient documentation

## 2016-06-26 DIAGNOSIS — M19011 Primary osteoarthritis, right shoulder: Secondary | ICD-10-CM | POA: Insufficient documentation

## 2016-08-21 ENCOUNTER — Other Ambulatory Visit (HOSPITAL_COMMUNITY): Payer: Self-pay | Admitting: Neurological Surgery

## 2016-08-21 ENCOUNTER — Other Ambulatory Visit: Payer: Self-pay | Admitting: Neurological Surgery

## 2016-08-21 DIAGNOSIS — M4150 Other secondary scoliosis, site unspecified: Secondary | ICD-10-CM

## 2016-08-21 DIAGNOSIS — M4180 Other forms of scoliosis, site unspecified: Secondary | ICD-10-CM

## 2016-09-09 ENCOUNTER — Ambulatory Visit (INDEPENDENT_AMBULATORY_CARE_PROVIDER_SITE_OTHER): Payer: Federal, State, Local not specified - PPO | Admitting: Internal Medicine

## 2016-09-09 ENCOUNTER — Encounter: Payer: Self-pay | Admitting: Internal Medicine

## 2016-09-09 DIAGNOSIS — W57XXXA Bitten or stung by nonvenomous insect and other nonvenomous arthropods, initial encounter: Secondary | ICD-10-CM | POA: Diagnosis not present

## 2016-09-09 DIAGNOSIS — R590 Localized enlarged lymph nodes: Secondary | ICD-10-CM | POA: Diagnosis not present

## 2016-09-09 DIAGNOSIS — Z8639 Personal history of other endocrine, nutritional and metabolic disease: Secondary | ICD-10-CM | POA: Diagnosis not present

## 2016-09-09 MED ORDER — AMOXICILLIN 500 MG PO CAPS: 500.0000 mg | ORAL_CAPSULE | Freq: Three times a day (TID) | ORAL | 0 refills | Status: DC

## 2016-09-09 NOTE — Assessment & Plan Note (Signed)
Amoxicillin po 

## 2016-09-09 NOTE — Assessment & Plan Note (Addendum)
Amoxicillin po for adenopathy

## 2016-09-09 NOTE — Progress Notes (Signed)
Subjective:  Patient ID: Courtney [name], female    DOB: 1950-05-11  Age: 66 y.o. MRN: [id number]  CC: No chief complaint on file.   HPI Courtney [name] presents for swollen glands under chin on the R - had a crown 10 d ago C/o tick bite L leg a few days ago  Outpatient Medications Prior to Visit  Medication Sig Dispense Refill  . aspirin 81 MG tablet Take 81 mg by mouth daily.    . Cholecalciferol (VITAMIN D3) 2000 UNITS capsule Take 1 capsule (2,000 Units total) by mouth daily. 100 capsule 3  . Coenzyme Q10 (CO Q 10 PO) Take 1 capsule by mouth daily.     . fish oil-omega-3 fatty acids 1000 MG capsule Take by mouth daily.    [name] FOLIC ACID PO Take 2 tablets by mouth daily.    . meloxicam (MOBIC) 15 MG tablet Take 1 tablet by mouth daily.  1  . rosuvastatin (CRESTOR) 5 MG tablet Take 1 tablet (5 mg total) by mouth daily. 30 tablet 11  . Vortioxetine HBr (TRINTELLIX PO) Take 7 mg by mouth daily.     No facility-administered medications prior to visit.     ROS Review of Systems  Constitutional: Negative for activity change, appetite change, chills, fatigue and unexpected weight change.  HENT: Negative for congestion, mouth sores and sinus pressure.   Eyes: Negative for visual disturbance.  Respiratory: Negative for cough and chest tightness.   Gastrointestinal: Negative for abdominal pain and nausea.  Genitourinary: Negative for difficulty urinating, frequency and vaginal pain.  Musculoskeletal: Positive for arthralgias and back pain. Negative for gait problem.  Skin: Negative for pallor and rash.  Neurological: Negative for dizziness, tremors, weakness, numbness and headaches.  Hematological: Positive for adenopathy. Does not bruise/bleed easily.  Psychiatric/Behavioral: Negative for confusion and sleep disturbance.    Objective:  BP 114/62 (BP Location: Left Arm, Patient Position: Sitting, Cuff Size: Normal)   Pulse 92   Temp 98.5 F (36.9 C) (Oral)   Ht 5'  2" (1.575 m)   Wt 122 lb 1.3 oz (55.4 kg)   LMP 01/08/1999   SpO2 99%   BMI 22.33 kg/m   BP Readings from Last 3 Encounters:  09/09/16 114/62  12/11/15 (!) 100/50  10/04/15 128/82    Wt Readings from Last 3 Encounters:  09/09/16 122 lb 1.3 oz (55.4 kg)  12/11/15 128 lb (58.1 kg)  10/04/15 123 lb (55.8 kg)    Physical Exam  Constitutional: She appears well-developed. No distress.  HENT:  Head: Normocephalic.  Right Ear: External ear normal.  Left Ear: External ear normal.  Nose: Nose normal.  Mouth/Throat: Oropharynx is clear and moist.  Eyes: Conjunctivae are normal. Pupils are equal, round, and reactive to light. Right eye exhibits no discharge. Left eye exhibits no discharge.  Neck: Normal range of motion. Neck supple. No JVD present. No tracheal deviation present. No thyromegaly present.  Cardiovascular: Normal rate, regular rhythm and normal heart sounds.   Pulmonary/Chest: No stridor. No respiratory distress. She has no wheezes.  Abdominal: Soft. Bowel sounds are normal. She exhibits no distension and no mass. There is no tenderness. There is no rebound and no guarding.  Musculoskeletal: She exhibits tenderness. She exhibits no edema.  Lymphadenopathy:    She has no cervical adenopathy.  Neurological: She displays normal reflexes. No cranial nerve deficit. She exhibits normal muscle tone. Coordination normal.  Skin: No rash noted. No erythema.  Psychiatric: She has a normal mood  and affect. Her behavior is normal. Judgment and thought content normal.   R inner thigh papule 6 mm R submandibular tender swollen gland  Lab Results  Component Value Date   WBC 6.0 05/09/2015   HGB 13.9 05/09/2015   HCT 41.8 05/09/2015   PLT 300.0 05/09/2015   GLUCOSE 88 05/04/2015   CHOL 260 (H) 06/06/2016   TRIG 62 06/06/2016   HDL 109 06/06/2016   LDLDIRECT 120.7 08/04/2012   LDLCALC 139 (H) 06/06/2016   ALT 12 06/06/2016   AST 19 06/06/2016   NA 140 05/04/2015   K 4.5  05/04/2015   CL 103 05/04/2015   CREATININE 0.77 05/04/2015   BUN 13 05/04/2015   CO2 29 05/04/2015   TSH 2.52 05/04/2015   INR 0.96 01/08/2012   HGBA1C 5.6 10/02/2011    Ct Cardiac Scoring  Addendum Date: 06/13/2015   ADDENDUM REPORT: 06/13/2015 19:36 CLINICAL DATA:  Risk stratification EXAM: Coronary Calcium Score TECHNIQUE: The patient was scanned on a Siemens Sensation 16 slice scanner. Axial non-contrast 3 mm slices were carried out through the heart. The data set was analyzed on a dedicated work station and scored using the [place]. FINDINGS: Non-cardiac: See separate report from [facility]. Ascending Aorta:  Normal caliber. Pericardium: Normal. Coronary arteries:  Normal origin. IMPRESSION: Coronary calcium score of 209. This was 29 percentile for age and sex matched control. Ena Dawley Electronically Signed   By: Ena Dawley   On: 06/13/2015 19:36  Result Date: 06/13/2015 EXAM: OVER-READ INTERPRETATION  CT CHEST The following report is an over-read performed by radiologist Dr. Laurence Ferrari [facility] on 06/13/2015. This over-read does not include interpretation of cardiac or coronary anatomy or pathology. The coronary calcium interpretation by the cardiologist is attached. COMPARISON:  Report from 06/03/2000 FINDINGS: Mediastinum/Nodes: Unremarkable Lungs/Pleura: 3 mm right lower lobe pulmonary nodule, image 24 series 3. 3 mm subpleural nodule in the right middle lobe, image 13 series 3. 4 mm right middle lobe nodule suspected on image 23 series 3. 2-3 mm right lower lobe pulmonary nodule, image 27 series 3. 4 by 3 mm left lower lobe pulmonary nodule, image 36 series 3. 3 mm pulmonary nodule in the left lower lobe anteriorly on image 24 series 3. Upper abdomen: Unremarkable Musculoskeletal: Mild spondylosis in the visualized thoracic spine. IMPRESSION: 1. Small bibasilar pulmonary nodules in the 3-4 mm range. No prior cross-sectional imaging  studies through this region are available for comparison. If the patient is at high risk for bronchogenic carcinoma, follow-up chest CT at 1 year is recommended. If the patient is at low risk, no follow-up is needed. This recommendation follows the consensus statement: Guidelines for Management of Small Pulmonary Nodules Detected on CT Scans: A Statement from the [employer] as published in Radiology 2005; 237:395-400. Electronically Signed: By: Van Clines M.D. On: 06/13/2015 16:59    Assessment & Plan:   There are no diagnoses linked to this encounter. I have discontinued [name]'s Vortioxetine HBr (TRINTELLIX PO) and rosuvastatin. I am also having her maintain her fish oil-omega-3 fatty acids, Coenzyme Q10 (CO Q 10 PO), Vitamin D3, meloxicam, FOLIC ACID PO, and aspirin.  No orders of the defined types were placed in this encounter.    Follow-up: No Follow-up on file.  Walker Kehr, MD

## 2016-09-09 NOTE — Assessment & Plan Note (Addendum)
Not on Rx 

## 2016-09-10 ENCOUNTER — Ambulatory Visit (HOSPITAL_COMMUNITY): Payer: Federal, State, Local not specified - PPO

## 2016-09-10 ENCOUNTER — Ambulatory Visit (HOSPITAL_COMMUNITY): Admission: RE | Admit: 2016-09-10 | Payer: Federal, State, Local not specified - PPO | Source: Ambulatory Visit

## 2016-09-11 ENCOUNTER — Ambulatory Visit (HOSPITAL_COMMUNITY): Payer: Federal, State, Local not specified - PPO

## 2016-09-15 ENCOUNTER — Telehealth: Payer: Self-pay | Admitting: *Deleted

## 2016-09-15 MED ORDER — AMOXICILLIN 500 MG PO CAPS: 500.0000 mg | ORAL_CAPSULE | Freq: Three times a day (TID) | ORAL | 0 refills | Status: DC

## 2016-09-15 NOTE — Telephone Encounter (Signed)
Pt left msg on triage stating saw MD last week gave rx for amoxicillin & she has lost the prescription. She is now in [facility] for a week. Wanting to know can MD call rx into the [facility] there @ [phone]. Is this ok...Courtney [name]

## 2016-09-15 NOTE — Telephone Encounter (Signed)
Notified pt MD ok to call rx to [facility] rx has been sent electronically...Courtney [name]

## 2016-09-15 NOTE — Telephone Encounter (Signed)
OK to send the Rx to [facility] Thx

## 2016-12-23 DIAGNOSIS — I2510 Atherosclerotic heart disease of native coronary artery without angina pectoris: Secondary | ICD-10-CM | POA: Insufficient documentation

## 2016-12-23 NOTE — Progress Notes (Addendum)
Cardiology Office Note    Date:  12/24/2016   ID:  Courtney [name], DOB Sep 21, 1950, MRN [id number]  PCP:  Courtney Anger, MD  Cardiologist: Sinclair Grooms, MD   Chief Complaint  Patient presents with  . Coronary Artery Disease    History of Present Illness:  Courtney [name] is a 66 y.o. female for follow-up of strong family history CAD, asymptomatic CAD, and hyperlipidemia.  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  She has coronary calcification by CT calcium scoring. She has a history of hyperlipidemia that is untreated, and family history of cardiac events. She does not smoke. She has not diabetic. We tried rosuvastatin 20 mg a year ago and after 6-8 weeks she discontinued therapy because of aching in her joints. She was lost to follow-up at that point.  She denies chest discomfort and dyspnea on exertion although as above, she has been inactive.  Past Medical History:  Diagnosis Date  . ADD (attention deficit disorder)   . Anxiety   . Celiac disease    possible vs IBS  . Depression    no bipolar per Dr. [name]  . GI problem    [name]  . Hyperlipidemia   . IBS (irritable bowel syndrome)   . Osteoarthritis   . Osteoporosis   . Seasonal allergies 01-08-12   hx. of multiple bronchitis related to this.  . Shoulder pain, right     Past Surgical History:  Procedure Laterality Date  . EXPLORATORY LAPAROTOMY  01-08-12   due to endometriosis-portions of both ovaries removed  . TONSILLECTOMY    . TOTAL HIP ARTHROPLASTY     right. 2002  . TOTAL HIP ARTHROPLASTY  01/14/2012   Procedure: TOTAL HIP ARTHROPLASTY;  Surgeon: Gearlean Alf, MD;  Location: [facility];  Service: Orthopedics;  Laterality: Left;  [name] VENTRAL HERNIA REPAIR  01-08-12   abdominal    Current Medications: Outpatient Medications Prior to Visit  Medication Sig Dispense Refill  .  aspirin 81 MG tablet Take 81 mg by mouth daily.    . Cholecalciferol (VITAMIN D3) 2000 UNITS capsule Take 1 capsule (2,000 Units total) by mouth daily. 100 capsule 3  . Coenzyme Q10 (CO Q 10 PO) Take 1 capsule by mouth daily.     . fish oil-omega-3 fatty acids 1000 MG capsule Take by mouth daily.    [name] FOLIC ACID PO Take 2 tablets by mouth daily.    [name] amoxicillin (AMOXIL) 500 MG capsule Take 1 capsule (500 mg total) by mouth 3 (three) times daily. 30 capsule 0  . meloxicam (MOBIC) 15 MG tablet Take 1 tablet by mouth daily.  1   No facility-administered medications prior to visit.      Allergies:   Bactrim [sulfamethoxazole-trimethoprim]; Morphine; Morphine and related; Ceclor [cefaclor]; Clarithromycin; Codeine; Erythromycin; Levofloxacin; Loratadine; Nitrofurantoin; Propoxyphene; Propoxyphene n-acetaminophen; Statins; and Zithromax [azithromycin]   Social History   Social History  . Marital status: Married    Spouse name: N/A  . Number of children: N/A  . Years of education: N/A   Occupational History  . Futures trader    Social History Main Topics  . Smoking status: Never Smoker  . Smokeless tobacco: Never Used     Comment: only social  . Alcohol use 0.0 oz/week     Comment: rare social   . Drug use: No  . Sexual activity:  Yes   Other Topics Concern  . None   Social History Narrative  . None     Family History:  The patient's family history includes ADD / ADHD in her unknown relative; Coronary artery disease in her unknown relative; Heart attack in her mother; Heart disease in her mother; Prostate cancer in her father.   ROS:   Please see the history of present illness.    Right shoulder discomfort and back discomfort as mentioned above. Not exercising.  All other systems reviewed and are negative.   PHYSICAL EXAM:   VS:  BP 104/70   Pulse 64   Ht 5\' 2"  (1.575 m)   Wt 113 lb 12.8 oz (51.6 kg)   LMP 01/08/1999   BMI 20.81 kg/m    GEN: Well nourished, well  developed, in no acute distress  HEENT: normal  Neck: no JVD, carotid bruits, or masses Cardiac: RRR; no murmurs, rubs, or gallops,no edema  Respiratory:  clear to auscultation bilaterally, normal work of breathing GI: soft, nontender, nondistended, + BS MS: no deformity or atrophy  Skin: warm and dry, no rash Neuro:  Alert and Oriented x 3, Strength and sensation are intact Psych: euthymic mood, full affect  Wt Readings from Last 3 Encounters:  12/24/16 113 lb 12.8 oz (51.6 kg)  09/09/16 122 lb 1.3 oz (55.4 kg)  12/11/15 128 lb (58.1 kg)      Studies/Labs Reviewed:   EKG:  EKG  Normal sinus rhythm with no change compared to prior.  Recent Labs: 06/06/2016: ALT 12   Lipid Panel    Component Value Date/Time   CHOL 260 (H) 06/06/2016 0759   CHOL 259 (H) 06/13/2015 1700   TRIG 62 06/06/2016 0759   TRIG 100 06/13/2015 1700   HDL 109 06/06/2016 0759   HDL 103 06/13/2015 1700   CHOLHDL 2.4 06/06/2016 0759   CHOLHDL 1.6 12/11/2015 0825   VLDL 9 12/11/2015 0825   LDLCALC 139 (H) 06/06/2016 0759   LDLCALC 136 (H) 06/13/2015 1700   LDLDIRECT 120.7 08/04/2012 1607    Additional studies/ records that were reviewed today include:  Coronary calcium score 06/13/15: IMPRESSION: Coronary calcium score of 209. This was 26 percentile for age and sex matched control. Ena Dawley Electronically Signed   By: Ena Dawley   On: 06/13/2015 19:36   ASSESSMENT:    1. Dyslipidemia   2. CAD in native artery   3. TOBACCO USE, QUIT   4. Bilateral carotid bruits      PLAN:  In order of problems listed above:  1. LDL 140 HDL 109 but has coronary calcification by CT scoring. The coronary calcium score is greater than 200. Resume statin therapy, rosuvastatin 5 mg per day. Liver and lipid panel in 8 weeks. Clinical follow-up in one year. Further direction concerning lipids will be dependent upon results. 2. Asymptomatic without angina. Positive coronary calcium score. 3. Not  currently smoking.  Asymptomatic with reference to vascular problems. Primary prevention of cardiac events is discussed. She fell moderate dose rosuvastatin due to musculoskeletal discomfort. Start 5 mg rosuvastatin daily. Repeat lipid panel. Further instruction dependent upon data.  Prolonged office visit with greater than 50% of the time spent in counseling concerning primary prevention, the pros and cons of statin therapy, discussion of alternative therapies, and the pathophysiology of progression of CAD  Medication Adjustments/Labs and Tests Ordered: Current medicines are reviewed at length with the patient today.  Concerns regarding medicines are outlined above.  Medication changes, Labs  and Tests ordered today are listed in the Patient Instructions below. Patient Instructions  Medication Instructions:  1) START Rosuvastatin 5mg  once daily 2) Take your Coenzyme Q10 daily  Labwork: Your physician recommends that you return for lab work in: 8 weeks (Lipid, liver)   Testing/Procedures: None  Follow-Up: Your physician wants you to follow-up in: 1 year with Dr. Tamala Julian.  You will receive a reminder letter in the mail two months in advance. If you don't receive a letter, please call our office to schedule the follow-up appointment.   Any Other Special Instructions Will Be Listed Below (If Applicable).     If you need a refill on your cardiac medications before your next appointment, please call your pharmacy.      Signed, Sinclair Grooms, MD  12/24/2016 1:19 PM    [facility] [address], [place], [place]  [address] Phone: [phone]; Fax: [phone]

## 2016-12-24 ENCOUNTER — Telehealth: Payer: Self-pay | Admitting: Interventional Cardiology

## 2016-12-24 ENCOUNTER — Telehealth: Payer: Self-pay | Admitting: Internal Medicine

## 2016-12-24 ENCOUNTER — Encounter: Payer: Self-pay | Admitting: Interventional Cardiology

## 2016-12-24 ENCOUNTER — Ambulatory Visit (INDEPENDENT_AMBULATORY_CARE_PROVIDER_SITE_OTHER): Payer: Federal, State, Local not specified - PPO | Admitting: Interventional Cardiology

## 2016-12-24 ENCOUNTER — Encounter (INDEPENDENT_AMBULATORY_CARE_PROVIDER_SITE_OTHER): Payer: Self-pay

## 2016-12-24 VITALS — BP 104/70 | HR 64 | Ht 62.0 in | Wt 113.8 lb

## 2016-12-24 DIAGNOSIS — R0989 Other specified symptoms and signs involving the circulatory and respiratory systems: Secondary | ICD-10-CM

## 2016-12-24 DIAGNOSIS — Z87891 Personal history of nicotine dependence: Secondary | ICD-10-CM | POA: Diagnosis not present

## 2016-12-24 DIAGNOSIS — I2510 Atherosclerotic heart disease of native coronary artery without angina pectoris: Secondary | ICD-10-CM | POA: Diagnosis not present

## 2016-12-24 DIAGNOSIS — E785 Hyperlipidemia, unspecified: Secondary | ICD-10-CM | POA: Diagnosis not present

## 2016-12-24 MED ORDER — ROSUVASTATIN CALCIUM 5 MG PO TABS: 5.0000 mg | ORAL_TABLET | Freq: Every day | ORAL | 3 refills | Status: DC

## 2016-12-24 NOTE — Telephone Encounter (Signed)
Spoke with pt and wanted to ask about the visit dx that was on her AVS today.  Went over these with her.  Pt verbalized understanding and was appreciative for call.

## 2016-12-24 NOTE — Telephone Encounter (Signed)
Patient has been placed on shingrix wait list, I will call her when I receive her vaccine in our office

## 2016-12-24 NOTE — Addendum Note (Signed)
Addended by: Loren Racer on: 12/24/2016 01:53 PM   Modules accepted: Orders

## 2016-12-24 NOTE — Telephone Encounter (Signed)
Pt called in and would like to be place on the wait list for shingrex

## 2016-12-24 NOTE — Patient Instructions (Signed)
Medication Instructions:  1) START Rosuvastatin 5mg  once daily 2) Take your Coenzyme Q10 daily  Labwork: Your physician recommends that you return for lab work in: 8 weeks (Lipid, liver)   Testing/Procedures: None  Follow-Up: Your physician wants you to follow-up in: 1 year with Dr. Tamala Julian.  You will receive a reminder letter in the mail two months in advance. If you don't receive a letter, please call our office to schedule the follow-up appointment.   Any Other Special Instructions Will Be Listed Below (If Applicable).     If you need a refill on your cardiac medications before your next appointment, please call your pharmacy.

## 2016-12-24 NOTE — Telephone Encounter (Signed)
Spoke with Dr. Tamala Julian about pt having carotid bruits.  Dr. Tamala Julian said to order carotid dopplers on pt.  Spoke with pt and made her aware.  Pt verbalized understanding and was in agreement with this plan.

## 2016-12-24 NOTE — Telephone Encounter (Signed)
New message     Pt was seen this am.  Calling to discuss with the nurse the printout she received when she left.  Please call

## 2017-01-06 ENCOUNTER — Ambulatory Visit (HOSPITAL_COMMUNITY)
Admission: RE | Admit: 2017-01-06 | Discharge: 2017-01-06 | Disposition: A | Discharge status: Home / Self Care | Payer: Federal, State, Local not specified - PPO | Source: Ambulatory Visit | Attending: Cardiovascular Disease | Admitting: Cardiovascular Disease

## 2017-01-06 DIAGNOSIS — R0989 Other specified symptoms and signs involving the circulatory and respiratory systems: Secondary | ICD-10-CM | POA: Diagnosis not present

## 2017-01-06 DIAGNOSIS — I6523 Occlusion and stenosis of bilateral carotid arteries: Secondary | ICD-10-CM | POA: Diagnosis not present

## 2017-01-12 DIAGNOSIS — G5601 Carpal tunnel syndrome, right upper limb: Secondary | ICD-10-CM | POA: Insufficient documentation

## 2017-01-22 ENCOUNTER — Telehealth: Payer: Self-pay

## 2017-01-22 NOTE — Telephone Encounter (Signed)
Advised patient that shingrix is now in office, her name was on a waitlist---patient has already starting getting at her pharmacy, she will complete shingrix vaccine at her pharmacy---patient's name has been removed from wait list

## 2017-01-26 ENCOUNTER — Telehealth: Payer: Self-pay | Admitting: Internal Medicine

## 2017-01-26 NOTE — Telephone Encounter (Signed)
Pt left me a vm needing a referral. I called back and left her a msg to call back with what kind of referral.

## 2017-01-27 NOTE — Telephone Encounter (Signed)
Spoke with Gareth Eagle and she stated the pt said this was from a conversation from a while ago and you have given her a name of someone to see, please advise.

## 2017-01-27 NOTE — Telephone Encounter (Signed)
The pt can call Dr Stephani Police office - no ref is needed Thx

## 2017-01-27 NOTE — Telephone Encounter (Signed)
Needing a referral for a psychiatrist.  Call back# [phone] She would like a call back today.

## 2017-01-28 NOTE — Telephone Encounter (Signed)
Spoke to pt regarding referral and gave her Dr. Starleen Arms name and number.

## 2017-02-18 ENCOUNTER — Other Ambulatory Visit: Payer: Federal, State, Local not specified - PPO | Admitting: *Deleted

## 2017-02-18 DIAGNOSIS — E785 Hyperlipidemia, unspecified: Secondary | ICD-10-CM

## 2017-02-18 LAB — HEPATIC FUNCTION PANEL
ALT: 14 IU/L (ref 0–32)
AST: 20 IU/L (ref 0–40)
Albumin: 4.5 g/dL (ref 3.6–4.8)
Alkaline Phosphatase: 49 IU/L (ref 39–117)
Bilirubin Total: 0.3 mg/dL (ref 0.0–1.2)
Bilirubin, Direct: 0.11 mg/dL (ref 0.00–0.40)
Total Protein: 6.5 g/dL (ref 6.0–8.5)

## 2017-02-18 LAB — LIPID PANEL
Chol/HDL Ratio: 1.9 ratio (ref 0.0–4.4)
Cholesterol, Total: 203 mg/dL — ABNORMAL HIGH (ref 100–199)
HDL: 108 mg/dL (ref >39)
LDL Calculated: 86 mg/dL (ref 0–99)
Triglycerides: 45 mg/dL (ref 0–149)
VLDL Cholesterol Cal: 9 mg/dL (ref 5–40)

## 2017-02-26 ENCOUNTER — Ambulatory Visit (INDEPENDENT_AMBULATORY_CARE_PROVIDER_SITE_OTHER): Payer: Federal, State, Local not specified - PPO | Admitting: Sports Medicine

## 2017-02-26 ENCOUNTER — Encounter: Payer: Self-pay | Admitting: Sports Medicine

## 2017-02-26 DIAGNOSIS — M48062 Spinal stenosis, lumbar region with neurogenic claudication: Secondary | ICD-10-CM | POA: Diagnosis not present

## 2017-02-26 DIAGNOSIS — M5386 Other specified dorsopathies, lumbar region: Secondary | ICD-10-CM

## 2017-02-26 MED ORDER — GABAPENTIN 300 MG PO CAPS: 300.0000 mg | ORAL_CAPSULE | Freq: Every day | ORAL | 2 refills | Status: DC

## 2017-02-26 NOTE — Assessment & Plan Note (Signed)
Add abdominal exercises to lessen spondylolithesis  Work on hip abduction strength This could be primary or secondary  Reck in 1 mo

## 2017-02-26 NOTE — Assessment & Plan Note (Signed)
I think she should start a conservative program and see if she benefits  Trial of gabapentin 300 qhs and gradually increase  PT with [facility]  Consider trying pilates to see if she can work strength without more pain

## 2017-02-26 NOTE — Progress Notes (Signed)
CC; Chronic sciatica on RT  Patient has had extensive evaluation [facility]/ [place] [facility]/ Dr Ramos/ Elsner in neurosurgery Injection ro RT piriformis helped  Short term Epidural steroids x 2 with no real benefit MRI confirms 3 lower levels have spondylolithesis Marked DJD and DDD  NS has suggested fusion  Saw Katrine Coho who suggested she could try rehab and comes for my opinion  Pain in RT leg with walking < 1 mile Numbness in leg and foot with activity Prolonged sitting sometimes causes pain Yoga sometimes flares pain and worse numbness Does not feel as stable on RT leg for hiking  Past HX THR bilat Recent RT shoulder surgery RCT Steroid dose pack did not help  Soc HX Architect  ROS Chronic sciatic pain to RT leg Pain over mid RT buttocks with sitting worsse balance on RT  PE THIN W F in NAD BP 124/70   Ht 5' (1.524 m)   Wt 117 lb (53.1 kg)   LMP 01/08/1999   BMI 22.85 kg/m   Low back with lumbar scoliotic change No TTP Able to walk on toes and heels SLR to 80 deg bilat with no pain  Normal Hip ROM bilat with no pain FABER is pain free Good hip flexion Very weak on RT hip abduction   Some TTP over RT piriformis  Able to do step ups/ cross over step ups

## 2017-03-09 DIAGNOSIS — M65312 Trigger thumb, left thumb: Secondary | ICD-10-CM | POA: Insufficient documentation

## 2017-03-13 ENCOUNTER — Other Ambulatory Visit: Payer: Self-pay

## 2017-03-13 MED ORDER — GABAPENTIN 300 MG PO CAPS: ORAL_CAPSULE | ORAL | 2 refills | Status: DC

## 2017-03-31 ENCOUNTER — Ambulatory Visit (INDEPENDENT_AMBULATORY_CARE_PROVIDER_SITE_OTHER): Payer: Federal, State, Local not specified - PPO | Admitting: Sports Medicine

## 2017-03-31 ENCOUNTER — Encounter: Payer: Self-pay | Admitting: Sports Medicine

## 2017-03-31 VITALS — BP 118/64 | Ht 60.0 in | Wt 116.0 lb

## 2017-03-31 DIAGNOSIS — M5386 Other specified dorsopathies, lumbar region: Secondary | ICD-10-CM

## 2017-03-31 DIAGNOSIS — M48062 Spinal stenosis, lumbar region with neurogenic claudication: Secondary | ICD-10-CM | POA: Diagnosis not present

## 2017-03-31 MED ORDER — GABAPENTIN 300 MG PO CAPS: ORAL_CAPSULE | ORAL | 2 refills | Status: DC

## 2017-03-31 MED ORDER — TRAMADOL HCL 50 MG PO TABS: 50.0000 mg | ORAL_TABLET | Freq: Three times a day (TID) | ORAL | 3 refills | Status: DC

## 2017-03-31 NOTE — Assessment & Plan Note (Signed)
We  Discussed options Surgical - [facility] favored A and P stabilization [facility] favored 3 level post stabilization Elsner discussed using myelogram to try to see if a 1 level approach might relieve pain  Pros and Cons of each discussed  We discussed conservative care:  Trial with gabapentin 300 tid coupled with tramadol 50 tid Stop meloxicam  Try meds first and reck in 1 mo Stop all but easy home exercises - later may use PT or pilates if better pain control  I spent 35 minutes with this patient. Over 50% of visit was spend in counseling and coordination of care for problems with sciatica and spinal stenosis

## 2017-03-31 NOTE — Progress Notes (Signed)
CC: RT sciatica  On last visit for her spinal stenosis and chronic sciatica we tried: Gabapentin 300 Cont. PT with Katrine Coho Pilates with Beverley Fiedler  PT causes her to have mopre pain the next day although she can do core and other exercises without pain during the session Pilates - lowest level of reformer caused pain Working through easy mat class she still felt more sciatica for 48 hrs  Gabapentin prob. Helped her sleep a bit better but felt somewhat groggy even taking low dose  PE Older F with some discomfort even sitting BP 118/64   Ht 5' (1.524 m)   Wt 116 lb (52.6 kg)   LMP 01/08/1999   BMI 22.65 kg/m   1 leg balance - can do bilaterally but feel worse on RT Walks heel and toe No foot drop Step up feels weaker on Rt  XRay reviewed and lots of DDD and anterolisthesis at multiple levels

## 2017-04-17 ENCOUNTER — Other Ambulatory Visit (HOSPITAL_COMMUNITY): Payer: Self-pay | Admitting: Neurological Surgery

## 2017-04-17 ENCOUNTER — Ambulatory Visit (HOSPITAL_COMMUNITY)
Admission: RE | Admit: 2017-04-17 | Discharge: 2017-04-17 | Disposition: A | Discharge status: Home / Self Care | Payer: Federal, State, Local not specified - PPO | Source: Ambulatory Visit | Attending: Neurological Surgery | Admitting: Neurological Surgery

## 2017-04-17 DIAGNOSIS — M4150 Other secondary scoliosis, site unspecified: Secondary | ICD-10-CM

## 2017-04-17 DIAGNOSIS — M4180 Other forms of scoliosis, site unspecified: Secondary | ICD-10-CM

## 2017-04-17 DIAGNOSIS — M47816 Spondylosis without myelopathy or radiculopathy, lumbar region: Secondary | ICD-10-CM | POA: Diagnosis not present

## 2017-04-17 DIAGNOSIS — M4316 Spondylolisthesis, lumbar region: Secondary | ICD-10-CM | POA: Insufficient documentation

## 2017-04-17 MED ORDER — DIAZEPAM 5 MG PO TABS
10.0000 mg | ORAL_TABLET | Freq: Once | ORAL | Status: AC
  Administered 2017-04-17: 10 mg via ORAL
  Filled 2017-04-17: qty 2

## 2017-04-17 MED ORDER — ONDANSETRON HCL 4 MG/2ML IJ SOLN: 4.0000 mg | Freq: Four times a day (QID) | INTRAMUSCULAR | Status: DC | PRN

## 2017-04-17 MED ORDER — HYDROCODONE-ACETAMINOPHEN 5-325 MG PO TABS: 1.0000 | ORAL_TABLET | ORAL | Status: DC | PRN

## 2017-04-17 MED ORDER — IOPAMIDOL (ISOVUE-M 300) INJECTION 61%
15.0000 mL | Freq: Once | INTRAMUSCULAR | Status: AC | PRN
  Administered 2017-04-17: 8 mL via INTRATHECAL

## 2017-04-17 MED ORDER — DIAZEPAM 5 MG PO TABS
ORAL_TABLET | ORAL | Status: AC
  Administered 2017-04-17: 10 mg via ORAL
  Filled 2017-04-17: qty 2

## 2017-04-17 MED ORDER — LIDOCAINE HCL (PF) 1 % IJ SOLN
5.0000 mL | Freq: Once | INTRAMUSCULAR | Status: AC
  Administered 2017-04-17: 5 mL via INTRADERMAL

## 2017-04-17 NOTE — Discharge Instructions (Signed)
Myelogram, Care After °Refer to this sheet in the next few weeks. These instructions provide you with information about caring for yourself after your procedure. Your health care provider may also give you more specific instructions. Your treatment has been planned according to current medical practices, but problems sometimes occur. Call your health care provider if you have any problems or questions after your procedure. °What can I expect after the procedure? °After the procedure, it is common to have: °· Soreness at your injection site. °· A mild headache. ° °Follow these instructions at home: °· Drink enough fluid to keep your urine clear or pale yellow. This will help flush out the dye (contrast material) from your spine. °· Rest as told by your health care provider. Lie flat with your head slightly raised (elevated) to reduce the risk of headache. °· Do not bend, lift, or do any strenuous activity for 24-48 hours or as told by your health care provider. °· Take over-the-counter and prescription medicines only as told by your health care provider. °· Take care of and remove your bandage (dressing) as told by your health care provider. °· Bathe or shower as told by your health care provider. °Contact a health care provider if: °· You have a fever. °· You have a headache that lasts longer than 24 hours. °· You feel nauseous or vomit. °· You have a stiff neck or numbness in your legs. °· You are unable to urinate or have a bowel movement. °· You develop a rash, itching, or sneezing. °Get help right away if: °· You have new symptoms or your symptoms get worse. °· You have a seizure. °· You have trouble breathing. °This information is not intended to replace advice given to you by your health care provider. Make sure you discuss any questions you have with your health care provider. °Document Released: 05/11/2015 Document Revised: 09/20/2015 Document Reviewed: 01/25/2015 °Elsevier Interactive Patient Education ©  2018 [employer] ° °

## 2017-04-17 NOTE — Procedures (Signed)
Courtney [name] is a 66 year old individual who's had significant problems with back and leg pain. She's been having this problem for over 2 years period of time and has advanced spondylosis noted on plain x-rays with degenerative listhesis at L3-4 L4-5 and L5-S1. Because of the significant bony arthropathy that is present I advised a CT scan to be done with myelography to better identify the bony prominences and planned surgical intervention to decompress and stabilize her spine. She is now to undergo myelogram.  Pre op Dx: Lumbar spondylolisthesis Post op Dx: Lumbar spondylolisthesis Procedure: Lumbar myelogram Surgeon: Orla Jolliff Puncture level: L2-3 Fluid color: Clear colorless Injection: Isovue 300, 8 mL Findings: Severe spondylolisthesis with stenosis at L3-4 L4-5 and L5-S1. More imaging by CT scanning

## 2017-05-05 ENCOUNTER — Ambulatory Visit: Payer: Federal, State, Local not specified - PPO | Admitting: Sports Medicine

## 2017-05-12 ENCOUNTER — Ambulatory Visit: Payer: Federal, State, Local not specified - PPO | Admitting: Sports Medicine

## 2017-06-02 ENCOUNTER — Telehealth: Payer: Self-pay | Admitting: Internal Medicine

## 2017-06-02 NOTE — Telephone Encounter (Signed)
Patient said her fever is now 102.

## 2017-06-02 NOTE — Telephone Encounter (Signed)
Please advise 

## 2017-06-02 NOTE — Telephone Encounter (Signed)
Copied from [place] #[id number]. Topic: General - Other >> Jun 02, 2017  2:38 PM Yvette Rack wrote: Reason for CRM: pt calling stating that she has been around some people that has the flu she would like to get sometime for it she has cough and fever 101 body ache chills please send it to the [facility], [place] - [address] [place] [address] [place] [address] [phone] (Phone) [phone] (Fax)

## 2017-06-03 ENCOUNTER — Encounter: Payer: Self-pay | Admitting: Family Medicine

## 2017-06-03 ENCOUNTER — Ambulatory Visit: Payer: Federal, State, Local not specified - PPO | Admitting: Family Medicine

## 2017-06-03 ENCOUNTER — Ambulatory Visit: Payer: Self-pay | Admitting: *Deleted

## 2017-06-03 VITALS — BP 120/62 | Temp 98.5°F | Wt 114.0 lb

## 2017-06-03 DIAGNOSIS — J111 Influenza due to unidentified influenza virus with other respiratory manifestations: Secondary | ICD-10-CM

## 2017-06-03 DIAGNOSIS — R6889 Other general symptoms and signs: Secondary | ICD-10-CM

## 2017-06-03 DIAGNOSIS — J101 Influenza due to other identified influenza virus with other respiratory manifestations: Secondary | ICD-10-CM | POA: Diagnosis not present

## 2017-06-03 LAB — POC INFLUENZA A&B (BINAX/QUICKVUE)
Influenza A, POC: POSITIVE — AB
Influenza B, POC: NEGATIVE

## 2017-06-03 MED ORDER — ONDANSETRON HCL 8 MG PO TABS: 8.0000 mg | ORAL_TABLET | Freq: Four times a day (QID) | ORAL | 0 refills | Status: DC | PRN

## 2017-06-03 MED ORDER — AMOXICILLIN-POT CLAVULANATE 875-125 MG PO TABS: 1.0000 | ORAL_TABLET | Freq: Two times a day (BID) | ORAL | 0 refills | Status: DC

## 2017-06-03 MED ORDER — OSELTAMIVIR PHOSPHATE 75 MG PO CAPS: 75.0000 mg | ORAL_CAPSULE | Freq: Two times a day (BID) | ORAL | 0 refills | Status: DC

## 2017-06-03 NOTE — Addendum Note (Signed)
Addended by: Cassandria Anger on: 06/03/2017 12:36 AM   Modules accepted: Orders

## 2017-06-03 NOTE — Telephone Encounter (Signed)
I will email a prescription for Tamiflu.  Please see your doctor in the office or go to ER if worse. thank you

## 2017-06-03 NOTE — Telephone Encounter (Signed)
Pt   Reports fever  Dry heaves    Cough  /  Body  Aches   X  sev  Days    Tamiflu    Called  In   Earlier  But   Has  Vomiting  And  Heaves . Attempted  To  Make  Appointment  With  Her  PCP  Today but they  Have no  Openings . Appt  Made  Today   With  Dr Sarajane Jews   Directions  Given  Pt  Verbalizes  Knowledge  Of plan of  Care .   Reason for Disposition . Fever present > 3 days (72 hours)  Answer Assessment - Initial Assessment Questions 1. TEMPERATURE: "What is the most recent temperature?"  "How was it measured?"         101  This   Am    2. ONSET: "When did the fever start?"          2  Days   Ago   3. SYMPTOMS: "Do you have any other symptoms besides the fever?"  (e.g., colds, headache, sore throat, earache, cough, rash, diarrhea, vomiting, abdominal pain)            Vomiting     Cough  And  Congestion    Green    Mucous body  aches    4. CAUSE: If there are no symptoms, ask: "What do you think is causing the fever?"             Some  TAMIFLU  THIS  AM     5. CONTACTS: "Does anyone else in the family have an infection?"         No   6. TREATMENT: "What have you done so far to treat this fever?" (e.g., medications)        Tylenol  2 -3  Hours  Ago     7. IMMUNOCOMPROMISE: "Do you have of the following: diabetes, HIV positive, splenectomy, cancer chemotherapy, chronic steroid treatment, transplant patient, etc."         None    8. PREGNANCY: "Is there any chance you are pregnant?" "When was your last menstrual period?"     n/a 9. TRAVEL: "Have you traveled out of the country in the last month?" (e.g., travel history, exposures)     no  Protocols used: FEVER-A-AH

## 2017-06-03 NOTE — Progress Notes (Signed)
   Subjective:    Patient ID: Courtney [name], female    DOB: [birth year], 67 y.o.   MRN: [id number]  HPI Here for 3 days of fever to 101 degrees, body aches, a deep cough producing green sputum, and nausea without vomiting. She started on Tamiflu this morning. Drinking fluids.    Review of Systems  Constitutional: Positive for chills and fever.  HENT: Negative.   Eyes: Negative.   Respiratory: Positive for cough.   Cardiovascular: Negative.   Gastrointestinal: Positive for nausea. Negative for abdominal pain, constipation, diarrhea and vomiting.  Genitourinary: Negative.        Objective:   Physical Exam  Constitutional:  Ill appearing   HENT:  Right Ear: External ear normal.  Left Ear: External ear normal.  Nose: Nose normal.  Mouth/Throat: Oropharynx is clear and moist.  Eyes: Conjunctivae are normal.  Neck: No thyromegaly present.  Cardiovascular: Normal rate, regular rhythm, normal heart sounds and intact distal pulses.  Pulmonary/Chest: Effort normal. No respiratory distress. She has no wheezes. She has no rales.  Scattered rhonchi   Abdominal: Soft. Bowel sounds are normal. She exhibits no distension and no mass. There is no tenderness. There is no rebound and no guarding.  Lymphadenopathy:    She has no cervical adenopathy.          Assessment & Plan:  Influenza A with a secondary bronchitis. Finish out the Tamiflu. Add Augmentin for 10 days. Use Zofran for nausea. Use Tylenol for fever or body aches.  Alysia Penna, MD

## 2017-06-04 ENCOUNTER — Ambulatory Visit: Payer: Federal, State, Local not specified - PPO | Admitting: Sports Medicine

## 2017-08-27 ENCOUNTER — Telehealth: Payer: Self-pay | Admitting: *Deleted

## 2017-08-27 NOTE — Telephone Encounter (Signed)
Per Dr Oneida Alar, patient is to take gabapentin 300 TID, tramadol 50 TID, AND mobic 15 QD  Patient voiced understanding

## 2017-09-10 ENCOUNTER — Other Ambulatory Visit: Payer: Self-pay

## 2017-09-10 ENCOUNTER — Telehealth: Payer: Self-pay

## 2017-09-10 MED ORDER — GABAPENTIN 600 MG PO TABS: 600.0000 mg | ORAL_TABLET | Freq: Three times a day (TID) | ORAL | 1 refills | Status: DC

## 2017-09-10 NOTE — Telephone Encounter (Signed)
Will refill gabapentin 600 mg TID. Pt schedule a f/u with Dr. Oneida Alar when she returns from vacation.

## 2017-10-20 ENCOUNTER — Encounter: Payer: Self-pay | Admitting: Sports Medicine

## 2017-10-20 ENCOUNTER — Ambulatory Visit: Payer: Federal, State, Local not specified - PPO | Admitting: Sports Medicine

## 2017-10-20 VITALS — BP 110/56 | Ht 60.0 in | Wt 116.0 lb

## 2017-10-20 DIAGNOSIS — M48062 Spinal stenosis, lumbar region with neurogenic claudication: Secondary | ICD-10-CM

## 2017-10-20 DIAGNOSIS — M4316 Spondylolisthesis, lumbar region: Secondary | ICD-10-CM

## 2017-10-20 MED ORDER — TRAMADOL HCL 50 MG PO TABS: 50.0000 mg | ORAL_TABLET | Freq: Every evening | ORAL | 2 refills | Status: DC | PRN

## 2017-10-20 NOTE — Progress Notes (Signed)
Subjective:    Courtney [name] is a 67 y.o. 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  She was referred to neurosurgery in the past.  She was recommended three-level interbody fusion by couple of neurosurgeons.   Today, she presents to clinic to discuss about the recommendations by her neurosurgeons and other therapeutic options she could exhaust before inevitable surgery. She tried gabapentin 600 mg 3 times daily without significant improvement in her pain.  So she went back to gabapentin 300 mg nightly and tramadol which gave her some relief and helped her sleep better.  She is also very active in yoga class.  She is cautious about back over extension.  She does state that her condition is limiting, and affecting her quality of life.  She states that she cannot take long trips, sit for long time or walk long as those will cause bad back and leg spasm.  She reports nocturnal spasm frequently. She denies bowel or bladder issue. PMH/Problem List: has ONYCHOMYCOSIS; Dyslipidemia; ANXIETY; DEPRESSION; A D D; Irritable bowel syndrome; Actinic keratosis; OSTEOARTHRITIS; Bilateral carotid bruits; TOBACCO USE, QUIT; OSTEOARTHRITIS, HIPS, BILATERAL; Otitis media, serous; Family history of coronary artery disease; Dry skin dermatitis; Constipation - functional; History of hypothyroidism; Abdominal pain, epigastric; CAD in native artery; Spinal stenosis, lumbar region, with neurogenic claudication; Sciatica of right side associated with disorder of lumbar spine; and Spondylolisthesis of lumbar region on their problem list.   has a past medical history of ADD (attention deficit disorder), Anxiety, Celiac disease, Depression, GI problem, Hyperlipidemia, IBS (irritable bowel syndrome), Osteoarthritis, Osteoporosis, Seasonal allergies (01-08-12), and Shoulder pain, right.  FH:  Family History  Problem  Relation Age of Onset  . Heart disease Mother   . Heart attack Mother   . Prostate cancer Father   . Coronary artery disease Unknown        FH Female 1st degree relative <60  . ADD / ADHD Unknown     SH Social History   Tobacco Use  . Smoking status: Never Smoker  . Smokeless tobacco: Never Used  . Tobacco comment: only social  Substance Use Topics  . Alcohol use: Yes    Alcohol/week: 0.0 oz    Comment: rare social   . Drug use: No    Review of Systems Review of systems negative except for pertinent positives and negatives in history of present illness above.     Objective:     Vitals:   10/20/17 0910  BP: (!) 110/56  Weight: 116 lb (52.6 kg)  Height: 5' (1.524 m)   Body mass index is 22.65 kg/m.  Physical Exam  GEN: appears well & comfortable. No apparent distress. RESP: no IWOB MSK: No obvious asymmetry.  No atrophy.  Full flexion and extension in her back.  Normal gait SKIN: no apparent skin lesion NEURO: alert and oiented appropriately, no gross deficits   PSYCH: euthymic mood with congruent affect  Assessment and Plan:  1. Spinal stenosis, lumbar region, with neurogenic claudication: patient has minimal to no improvement in her pain with conservative measures.  She feels it is very limiting. She  says she is not able to make trips, walk as she likes to or sleep.  At this point, I think it is reasonable to consider the surgical options given to her by her neurosurgeons.  Meanwhile, recommend continuing home exercise regimen, gabapentin nightly and tramadol, and avoiding over-extension or  flexion. -Tramadol 50 mg nightly.  She is also on SSRI.  Discussed about the risk of serotonin syndrome  2. Spondylolisthesis of lumbar region -As above  Mercy Riding, MD 10/20/17 Pager: [phone]  I observed and examined the patient with the resident and agree with assessment and plan.  Note reviewed and modified by me. Ila Mcgill, MD

## 2017-10-26 DIAGNOSIS — M75121 Complete rotator cuff tear or rupture of right shoulder, not specified as traumatic: Secondary | ICD-10-CM | POA: Diagnosis not present

## 2017-10-26 DIAGNOSIS — M19011 Primary osteoarthritis, right shoulder: Secondary | ICD-10-CM | POA: Diagnosis not present

## 2017-11-12 DIAGNOSIS — Z6822 Body mass index (BMI) 22.0-22.9, adult: Secondary | ICD-10-CM | POA: Diagnosis not present

## 2017-11-12 DIAGNOSIS — R6882 Decreased libido: Secondary | ICD-10-CM | POA: Diagnosis not present

## 2017-11-12 DIAGNOSIS — R5383 Other fatigue: Secondary | ICD-10-CM | POA: Diagnosis not present

## 2017-11-12 DIAGNOSIS — Z1231 Encounter for screening mammogram for malignant neoplasm of breast: Secondary | ICD-10-CM | POA: Diagnosis not present

## 2017-11-12 DIAGNOSIS — Z124 Encounter for screening for malignant neoplasm of cervix: Secondary | ICD-10-CM | POA: Diagnosis not present

## 2017-12-10 DIAGNOSIS — Z791 Long term (current) use of non-steroidal anti-inflammatories (NSAID): Secondary | ICD-10-CM | POA: Diagnosis not present

## 2017-12-10 DIAGNOSIS — G629 Polyneuropathy, unspecified: Secondary | ICD-10-CM | POA: Diagnosis not present

## 2017-12-10 DIAGNOSIS — M150 Primary generalized (osteo)arthritis: Secondary | ICD-10-CM | POA: Diagnosis not present

## 2017-12-10 DIAGNOSIS — R768 Other specified abnormal immunological findings in serum: Secondary | ICD-10-CM | POA: Diagnosis not present

## 2017-12-10 DIAGNOSIS — L603 Nail dystrophy: Secondary | ICD-10-CM | POA: Diagnosis not present

## 2017-12-10 DIAGNOSIS — D7589 Other specified diseases of blood and blood-forming organs: Secondary | ICD-10-CM | POA: Diagnosis not present

## 2017-12-10 DIAGNOSIS — Z6823 Body mass index (BMI) 23.0-23.9, adult: Secondary | ICD-10-CM | POA: Diagnosis not present

## 2017-12-10 DIAGNOSIS — R5383 Other fatigue: Secondary | ICD-10-CM | POA: Diagnosis not present

## 2017-12-10 DIAGNOSIS — M0579 Rheumatoid arthritis with rheumatoid factor of multiple sites without organ or systems involvement: Secondary | ICD-10-CM | POA: Diagnosis not present

## 2017-12-14 ENCOUNTER — Other Ambulatory Visit: Payer: Self-pay

## 2017-12-14 ENCOUNTER — Telehealth: Payer: Self-pay

## 2017-12-14 MED ORDER — TRAMADOL HCL 50 MG PO TABS: 50.0000 mg | ORAL_TABLET | Freq: Every evening | ORAL | 0 refills | Status: DC | PRN

## 2017-12-14 NOTE — Telephone Encounter (Signed)
Tramadol refill faxed to pharmacy.

## 2018-01-12 DIAGNOSIS — F411 Generalized anxiety disorder: Secondary | ICD-10-CM | POA: Diagnosis not present

## 2018-01-12 DIAGNOSIS — F3342 Major depressive disorder, recurrent, in full remission: Secondary | ICD-10-CM | POA: Diagnosis not present

## 2018-01-12 DIAGNOSIS — F900 Attention-deficit hyperactivity disorder, predominantly inattentive type: Secondary | ICD-10-CM | POA: Diagnosis not present

## 2018-02-10 ENCOUNTER — Telehealth: Payer: Self-pay | Admitting: Interventional Cardiology

## 2018-02-10 NOTE — Telephone Encounter (Signed)
Pt is calling in inquiring if she should have her yearly labs done prior to seeing Dr Tamala Julian on 02/16/18.  Pt states she usually has her lipids and LFTs done, prior to the vist.  Pt is requesting, if possible, to have her calcium and magnesium level checked as well.  Pt also prefers having her labs done prior to her 10/22 OV, so that she can go over the results with Dr Tamala Julian at that time.  Informed the pt that I will route her request to both Dr Tamala Julian and covering Nurse, for further review, recommendation, and follow-up with the pt thereafter.  Pt verbalized understanding and agrees with this

## 2018-02-10 NOTE — Telephone Encounter (Signed)
New Message:     Pt wants to know if she needs her Cholesterol and any lab work before her appt to see Dr Tamala Julian on 02-16-18? If she does, please order and call her to let her know please.

## 2018-02-11 DIAGNOSIS — Z1321 Encounter for screening for nutritional disorder: Secondary | ICD-10-CM | POA: Diagnosis not present

## 2018-02-11 DIAGNOSIS — R6882 Decreased libido: Secondary | ICD-10-CM | POA: Diagnosis not present

## 2018-02-11 DIAGNOSIS — E785 Hyperlipidemia, unspecified: Secondary | ICD-10-CM | POA: Diagnosis not present

## 2018-02-11 DIAGNOSIS — Z13228 Encounter for screening for other metabolic disorders: Secondary | ICD-10-CM | POA: Diagnosis not present

## 2018-02-11 DIAGNOSIS — Z131 Encounter for screening for diabetes mellitus: Secondary | ICD-10-CM | POA: Diagnosis not present

## 2018-02-11 DIAGNOSIS — Z130 Encounter for screening for diseases of the blood and blood-forming organs and certain disorders involving the immune mechanism: Secondary | ICD-10-CM | POA: Diagnosis not present

## 2018-02-11 DIAGNOSIS — Z1329 Encounter for screening for other suspected endocrine disorder: Secondary | ICD-10-CM | POA: Diagnosis not present

## 2018-02-11 DIAGNOSIS — E349 Endocrine disorder, unspecified: Secondary | ICD-10-CM | POA: Diagnosis not present

## 2018-02-11 LAB — HEPATIC FUNCTION PANEL
ALT: 13 (ref 7–35)
AST: 16 (ref 13–35)
Alkaline Phosphatase: 51 (ref 25–125)
Bilirubin, Total: 0.4

## 2018-02-11 LAB — LIPID PANEL
Cholesterol: 287 — AB (ref 0–200)
HDL: 102 — AB (ref 35–70)
LDL Cholesterol: 165
Triglycerides: 92 (ref 40–160)

## 2018-02-11 LAB — BASIC METABOLIC PANEL
BUN: 13 (ref 4–21)
Creatinine: 0.8 (ref 0.5–1.1)
Glucose: 93
Potassium: 4.5 (ref 3.4–5.3)
Sodium: 138 (ref 137–147)

## 2018-02-11 LAB — TSH: TSH: 1.58 (ref 0.41–5.90)

## 2018-02-11 LAB — HEMOGLOBIN A1C: Hgb A1c MFr Bld: 5.4 (ref 4.0–6.0)

## 2018-02-11 LAB — CBC AND DIFFERENTIAL
HCT: 44 (ref 36–46)
Hemoglobin: 14.8 (ref 12.0–16.0)
Platelets: 315 (ref 150–399)
WBC: 4

## 2018-02-11 NOTE — Telephone Encounter (Signed)
Please do liver and lipid panel priro to allow discussion at [facility].

## 2018-02-11 NOTE — Telephone Encounter (Signed)
Spoke with pt and advised Dr. Tamala Julian said ok to draw labs.  Pt states she actually just had labs drawn at GYN this morning.  She is out to lunch and will call me back once she leaves.

## 2018-02-12 NOTE — Telephone Encounter (Signed)
Spoke with pt and she states she had a Lipid, CMP, Vit D, CBC with diff, and TSH at her GYN office.  Advised they covered all the labs that we would need.  Pt states all of the results are not back in yet.  Advised they should be by Monday and I would have my medical records dept reach out on Monday to obtain results.  Pt verbalized understanding and was appreciative for assistance.

## 2018-02-15 NOTE — Progress Notes (Signed)
Cardiology Office Note:    Date:  02/16/2018   ID:  Courtney [name], DOB June 16, 1950, MRN [id number]  PCP:  Cassandria Anger, MD  Cardiologist:  Sinclair Grooms, MD   Referring MD: Cassandria Anger, MD   Chief Complaint  Patient presents with  . Advice Only    Hyperlipidemia    History of Present Illness:    Courtney [name] is a 67 y.o. female with a hx of strong family history CAD, asymptomatic CAD, and hyperlipidemia.  No cardiac complaints.  No claudication, transient neurological symptoms, shortness of breath, angina, or syncope.  Not currently able to exercise because of sciatica.  Denies orthopnea, PND, or chest pain.  Unable to tolerate statin therapy.  We have tried twice.  She is able to take the medicine for 2 to 8 months and then developed significant muscle aching and pain.  Past Medical History:  Diagnosis Date  . ADD (attention deficit disorder)   . Anxiety   . Celiac disease    possible vs IBS  . Depression    no bipolar per Dr. [name]  . GI problem    [name]  . Hyperlipidemia   . IBS (irritable bowel syndrome)   . Osteoarthritis   . Osteoporosis   . Seasonal allergies 01-08-12   hx. of multiple bronchitis related to this.  . Shoulder pain, right     Past Surgical History:  Procedure Laterality Date  . EXPLORATORY LAPAROTOMY  01-08-12   due to endometriosis-portions of both ovaries removed  . TONSILLECTOMY    . TOTAL HIP ARTHROPLASTY     right. 2002  . TOTAL HIP ARTHROPLASTY  01/14/2012   Procedure: TOTAL HIP ARTHROPLASTY;  Surgeon: Gearlean Alf, MD;  Location: [facility];  Service: Orthopedics;  Laterality: Left;  [name] VENTRAL HERNIA REPAIR  01-08-12   abdominal    Current Medications: Current Meds  Medication Sig  . amphetamine-dextroamphetamine (ADDERALL) 10 MG tablet Take 10 mg by mouth daily as needed.  [name] aspirin 81 MG tablet Take 81 mg by mouth daily.  . Calcium Carb-Cholecalciferol (CALCIUM 600+D3 PO) Take 1,200  tablets by mouth daily.   . Cholecalciferol (VITAMIN D3) 2000 UNITS capsule Take 1 capsule (2,000 Units total) by mouth daily.  . folic acid (FOLVITE) 1 MG tablet Take 2 mg by mouth daily.   [name] gabapentin (NEURONTIN) 100 MG capsule Take 100 mg by mouth 3 (three) times daily as needed (nerve pain).  [name] gabapentin (NEURONTIN) 600 MG tablet Take 1 tablet (600 mg total) by mouth 3 (three) times daily.  . meloxicam (MOBIC) 15 MG tablet Take 15 mg by mouth daily.  . traMADol (ULTRAM) 50 MG tablet Take 1 tablet (50 mg total) by mouth at bedtime as needed.  . vitamin E 400 UNIT capsule Take 400 Units by mouth daily.     Allergies:   Bactrim [sulfamethoxazole-trimethoprim]; Morphine; Morphine and related; Ceclor [cefaclor]; Clarithromycin; Codeine; Erythromycin; Levofloxacin; Loratadine; Nitrofurantoin; Propoxyphene; Propoxyphene n-acetaminophen; and Zithromax [azithromycin]   Social History   Socioeconomic History  . Marital status: Married    Spouse name: Not on file  . Number of children: Not on file  . Years of education: Not on file  . Highest education level: Not on file  Occupational History  . Occupation: Radiation protection practitioner  . Financial resource strain: Not on file  . Food insecurity:    Worry: Not on file    Inability: Not on file  . Transportation needs:  Medical: Not on file    Non-medical: Not on file  Tobacco Use  . Smoking status: Never Smoker  . Smokeless tobacco: Never Used  . Tobacco comment: only social  Substance and Sexual Activity  . Alcohol use: Yes    Alcohol/week: 0.0 standard drinks    Comment: rare social   . Drug use: No  . Sexual activity: Yes  Lifestyle  . Physical activity:    Days per week: Not on file    Minutes per session: Not on file  . Stress: Not on file  Relationships  . Social connections:    Talks on phone: Not on file    Gets together: Not on file    Attends religious service: Not on file    Active member of club or  organization: Not on file    Attends meetings of clubs or organizations: Not on file    Relationship status: Not on file  Other Topics Concern  . Not on file  Social History Narrative  . Not on file     Family History: The patient's family history includes ADD / ADHD in her unknown relative; Coronary artery disease in her unknown relative; Heart attack in her mother; Heart disease in her mother; Prostate cancer in her father.  ROS:   Please see the history of present illness.    Coulddecreased memory, back discomfort, sciatica preventing exercise, difficulty urinating. All other systems reviewed and are negative.  EKGs/Labs/Other Studies Reviewed:    The following studies were reviewed today: 2017 coronary calcium score: IMPRESSION: Coronary calcium score of 209. This was 69 percentile for age and sex matched control  EKG:  EKG is  ordered today.  The ekg ordered today demonstrates normal sinus rhythm with poor R wave progression V1 and V2 otherwise normal  Recent Labs: 02/18/2017: ALT 14  Recent Lipid Panel    Component Value Date/Time   CHOL 203 (H) 02/18/2017 0759   CHOL 259 (H) 06/13/2015 1700   TRIG 45 02/18/2017 0759   TRIG 100 06/13/2015 1700   HDL 108 02/18/2017 0759   HDL 103 06/13/2015 1700   CHOLHDL 1.9 02/18/2017 0759   CHOLHDL 1.6 12/11/2015 0825   VLDL 9 12/11/2015 0825   LDLCALC 86 02/18/2017 0759   LDLCALC 136 (H) 06/13/2015 1700   LDLDIRECT 120.7 08/04/2012 1607    Physical Exam:    VS:  BP 132/80   Pulse 87   Ht 5' (1.524 m)   Wt 121 lb 9.6 oz (55.2 kg)   LMP 01/08/1999   BMI 23.75 kg/m     Wt Readings from Last 3 Encounters:  02/16/18 121 lb 9.6 oz (55.2 kg)  10/20/17 116 lb (52.6 kg)  06/03/17 114 lb (51.7 kg)     GEN:  Well nourished, well developed in no acute distress HEENT: Normal NECK: No JVD. LYMPHATICS: No lymphadenopathy CARDIAC: RRR, no murmur, no gallop, no edema. VASCULAR: 2+ bilateral carotid and radial pulses.   Posterior tibials are 2+ in bilateral pulses.  No bruits. RESPIRATORY:  Clear to auscultation without rales, wheezing or rhonchi  ABDOMEN: Soft, non-tender, non-distended, No pulsatile mass, MUSCULOSKELETAL: No deformity  SKIN: Warm and dry NEUROLOGIC:  Alert and oriented x 3 PSYCHIATRIC:  Normal affect   ASSESSMENT:    1. CAD in native artery   2. Bilateral carotid bruits   3. Dyslipidemia   4. TOBACCO USE, QUIT    PLAN:    In order of problems listed above:  1. Based upon  coronary calcium present on coronary calcium score.  Eggleston score was 200. 2. No carotid bruits are heard on exam today. 3. The most recent LDL was 165.  She was unable to tolerate low-dose rosuvastatin because of muscle aches and pains.  This is a second time that we have discontinued statin therapy because of musculoskeletal side effects. 4. Remote history of tobacco use.  Will refer to the lipid clinic for initiation of PCSK9 therapy.  Long discussion concerning primary risk prevention in the setting of positive family history of premature atherosclerosis (sister and mother), severe elevation in lipids, and positive coronary calcium score.  Greater than 50% of the time during this office visit was spent in education, counseling, and coordination of care related to underlying disease process and testing as outlined.    Medication Adjustments/Labs and Tests Ordered: Current medicines are reviewed at length with the patient today.  Concerns regarding medicines are outlined above.  Orders Placed This Encounter  Procedures  . AMB Referral to [facility]  . EKG 12-Lead   No orders of the defined types were placed in this encounter.   Patient Instructions  Medication Instructions:  Your physician recommends that you continue on your current medications as directed. Please refer to the Current Medication list given to you today.  If you need a refill on your cardiac medications  before your next appointment, please call your pharmacy.   Lab work: None If you have labs (blood work) drawn today and your tests are completely normal, you will receive your results only by: [name] MyChart Message (if you have MyChart) OR . A paper copy in the mail If you have any lab test that is abnormal or we need to change your treatment, we will call you to review the results.  Testing/Procedures: None  Follow-Up: At [facility], you and your health needs are our priority.  As part of our continuing mission to provide you with exceptional heart care, we have created designated Provider Care Teams.  These Care Teams include your primary Cardiologist (physician) and Advanced Practice Providers (APPs -  Physician Assistants and Nurse Practitioners) who all work together to provide you with the care you need, when you need it. You will need a follow up appointment in 12 months.  Please call our office 2 months in advance to schedule this appointment.  You may see Sinclair Grooms, MD or one of the following Advanced Practice Providers on your designated Care Team:   Truitt Merle, NP Cecilie Kicks, NP . [name], NP  Any Other Special Instructions Will Be Listed Below (If Applicable).  You have been referred to the [facility].       Signed, Sinclair Grooms, MD  02/16/2018 4:58 PM    [place]

## 2018-02-16 ENCOUNTER — Encounter (INDEPENDENT_AMBULATORY_CARE_PROVIDER_SITE_OTHER): Payer: Self-pay

## 2018-02-16 ENCOUNTER — Ambulatory Visit (INDEPENDENT_AMBULATORY_CARE_PROVIDER_SITE_OTHER): Payer: Medicare Other | Admitting: Interventional Cardiology

## 2018-02-16 ENCOUNTER — Encounter: Payer: Self-pay | Admitting: Interventional Cardiology

## 2018-02-16 VITALS — BP 132/80 | HR 87 | Ht 60.0 in | Wt 121.6 lb

## 2018-02-16 DIAGNOSIS — I2510 Atherosclerotic heart disease of native coronary artery without angina pectoris: Secondary | ICD-10-CM | POA: Diagnosis not present

## 2018-02-16 DIAGNOSIS — R0989 Other specified symptoms and signs involving the circulatory and respiratory systems: Secondary | ICD-10-CM | POA: Diagnosis not present

## 2018-02-16 DIAGNOSIS — E785 Hyperlipidemia, unspecified: Secondary | ICD-10-CM

## 2018-02-16 DIAGNOSIS — Z87891 Personal history of nicotine dependence: Secondary | ICD-10-CM | POA: Diagnosis not present

## 2018-02-16 NOTE — Patient Instructions (Signed)
Medication Instructions:  Your physician recommends that you continue on your current medications as directed. Please refer to the Current Medication list given to you today.  If you need a refill on your cardiac medications before your next appointment, please call your pharmacy.   Lab work: None If you have labs (blood work) drawn today and your tests are completely normal, you will receive your results only by: [name] MyChart Message (if you have MyChart) OR . A paper copy in the mail If you have any lab test that is abnormal or we need to change your treatment, we will call you to review the results.  Testing/Procedures: None  Follow-Up: At [facility], you and your health needs are our priority.  As part of our continuing mission to provide you with exceptional heart care, we have created designated Provider Care Teams.  These Care Teams include your primary Cardiologist (physician) and Advanced Practice Providers (APPs -  Physician Assistants and Nurse Practitioners) who all work together to provide you with the care you need, when you need it. You will need a follow up appointment in 12 months.  Please call our office 2 months in advance to schedule this appointment.  You may see Sinclair Grooms, MD or one of the following Advanced Practice Providers on your designated Care Team:   Truitt Merle, NP Cecilie Kicks, NP . [name], NP  Any Other Special Instructions Will Be Listed Below (If Applicable).  You have been referred to the [facility].

## 2018-03-02 ENCOUNTER — Telehealth: Payer: Self-pay | Admitting: Interventional Cardiology

## 2018-03-02 NOTE — Telephone Encounter (Signed)
New Message    Patient wants to discuss trying another statin before she goes to the Pharmacist , she wants to discuss the side effects and risks as well

## 2018-03-02 NOTE — Telephone Encounter (Signed)
Spoke with Courtney [name] and she said she read up on PCSK9 Inhibitors and was concerned about possible side effects.  She wanted to know if Dr. Tamala Julian felt like she should try another statin prior to discussing the PCSK9 Inhibitor.  Advised Courtney [name] to keep the appt with the Lipid Clinic and they will discuss all options for Lipid control.  Courtney [name] verbalized understanding and was appreciative for call.

## 2018-03-03 NOTE — Progress Notes (Signed)
Patient ID: CLARAMAE RIGDON                 DOB: 1950/11/18                    MRN: [id number]     HPI: Sariya Trickey Andres-Gregg is a 67 y.o. female patient of Dr. Thompson Caul that presents today for lipid evaluation.  PMH includes family hx of CAD, asymptomatic CAD, and hyperlipidemia. Patient has been tried on a statin twice. Patient is unable to tolerate statin therapy - she is able to take a statin for 2-8 months and then she develops significant muscle aches and pain.   Patient presents today by herself with many questions about lipid management and coronary artery disease prevention. Patient asks about coronary calcium score and how often it is repeated. Inquires about other diagnostic tests for CAD. Patient has tried rosuvastatin twice, at 20mg  and 5mg  doses. Reports that she felt muscle pain with both doses of Crestor. Patient asks about Lipitor and the risk of myalgia with this medication. She also asks about efficacy of red yeast rice and its benefit in risk reduction.   Risk Factors: Family hx of CAD, LDL >100, coronary calcium score, ASCVD risk: 8.9% LDL Goal: <70 mg/dL  Current Medications: aspirin 81 mg daily Intolerances: rosuvastatin (20 mg, 5 mg; myalgia)   Diet: most meals are from home. She does eat meat, fish 1-2x/month, red meat, pork, and chicken. Does not eat fried foods. Uses olive oil when cooking, adds butter if she needs to increase the smoke point of her cooking medium. Uses smart balance in place of butter for toast. Patient is gluten free.  Exercise: Unable to exercise d/t sciatica and lumbar spine issues   Family History: CAD (unknown relative), MI (mother), heart disease (mother), prostate cancer (father)  Labs: Lipid panel (02/12/18): TC 287, HDL 102, LDL 165 (no therapy)  Coronary Calcium score (06/13/15): 84 (81st percentile for age and sex matched control)  Past Medical History:  Diagnosis Date  . ADD (attention deficit disorder)   . Anxiety   .  Celiac disease    possible vs IBS  . Depression    no bipolar per Dr. [name]  . GI problem    [name]  . Hyperlipidemia   . IBS (irritable bowel syndrome)   . Osteoarthritis   . Osteoporosis   . Seasonal allergies 01-08-12   hx. of multiple bronchitis related to this.  . Shoulder pain, right     Current Outpatient Medications on File Prior to Visit  Medication Sig Dispense Refill  . amphetamine-dextroamphetamine (ADDERALL) 10 MG tablet Take 10 mg by mouth daily as needed.    [name] aspirin 81 MG tablet Take 81 mg by mouth daily.    . Calcium Carb-Cholecalciferol (CALCIUM 600+D3 PO) Take 1,200 tablets by mouth daily.     . Cholecalciferol (VITAMIN D3) 2000 UNITS capsule Take 1 capsule (2,000 Units total) by mouth daily. 211 capsule 3  . folic acid (FOLVITE) 1 MG tablet Take 2 mg by mouth daily.     [name] gabapentin (NEURONTIN) 100 MG capsule Take 100 mg by mouth 3 (three) times daily as needed (nerve pain).    [name] gabapentin (NEURONTIN) 600 MG tablet Take 1 tablet (600 mg total) by mouth 3 (three) times daily. 90 tablet 1  . meloxicam (MOBIC) 15 MG tablet Take 15 mg by mouth daily.    . traMADol (ULTRAM) 50 MG tablet Take 1 tablet (50 mg total)  by mouth at bedtime as needed. 90 tablet 0  . vitamin E 400 UNIT capsule Take 400 Units by mouth daily.     No current facility-administered medications on file prior to visit.     Allergies  Allergen Reactions  . Bactrim [Sulfamethoxazole-Trimethoprim] Nausea And Vomiting  . Morphine Nausea And Vomiting  . Morphine And Related Nausea And Vomiting  . Ceclor [Cefaclor] Nausea And Vomiting    Can take Augmentin ok  . Clarithromycin Nausea Only    REACTION: nausea  . Codeine Nausea And Vomiting  . Erythromycin Nausea And Vomiting  . Levofloxacin Nausea Only    REACTION: nausea  . Loratadine     REACTION: bruises  . Nitrofurantoin Nausea Only  . Propoxyphene Nausea Only    dizzy  . Propoxyphene N-Acetaminophen Nausea Only    dizzy  .  Zithromax [Azithromycin] Nausea And Vomiting    Assessment/Plan: Hyperlipidemia: Based on her most recent lipid panel, LDL is not at goal <70 given her elevated calcium score and patient may need high intensity statin therapy to reach goal. Patient is willing to rechallenge statin before trying a PCSK9 inhibitor. Since she has had intolerances to rosuvastatin at both a low and high dose, started atorvastatin 10 mg po daily with plans to titrate if tolerability allows. Counseled patient on what the coronary calcium score means and its relation to the risk of an event as well as dietary changes that would benefit her cholesterol. Follow up for labs on 05/04/18 and with lipid clinic on 05/06/18.    Seen with Cleotis Lema, PharmD Student   Thank you,  Lelan Pons. Patterson Hammersmith, [facility]  03/03/2018 3:50 PM

## 2018-03-04 ENCOUNTER — Encounter: Payer: Self-pay | Admitting: Pharmacist

## 2018-03-04 ENCOUNTER — Ambulatory Visit (INDEPENDENT_AMBULATORY_CARE_PROVIDER_SITE_OTHER): Payer: Medicare Other | Admitting: Pharmacist

## 2018-03-04 DIAGNOSIS — I2510 Atherosclerotic heart disease of native coronary artery without angina pectoris: Secondary | ICD-10-CM

## 2018-03-04 DIAGNOSIS — E785 Hyperlipidemia, unspecified: Secondary | ICD-10-CM

## 2018-03-04 MED ORDER — ATORVASTATIN CALCIUM 10 MG PO TABS: 10.0000 mg | ORAL_TABLET | Freq: Every day | ORAL | 3 refills | Status: DC

## 2018-03-04 NOTE — Patient Instructions (Addendum)
Thanks for coming to see [place] today!  START taking atorvastatin 10 mg by mouth daily.   Please call the clinic if you have any issues tolerating the atorvastatin at [phone].  Make sure to come back on 05/04/18 for a recheck of your labs. Please be in a fasting state (>8 hours).  Come back and see [place] on 05/06/18 at 9:00 am.

## 2018-03-05 ENCOUNTER — Other Ambulatory Visit: Payer: Self-pay | Admitting: Sports Medicine

## 2018-03-09 ENCOUNTER — Encounter: Payer: Self-pay | Admitting: Internal Medicine

## 2018-03-09 ENCOUNTER — Other Ambulatory Visit: Payer: Self-pay | Admitting: Sports Medicine

## 2018-03-09 MED ORDER — TRAMADOL HCL 50 MG PO TABS: 50.0000 mg | ORAL_TABLET | Freq: Every evening | ORAL | 0 refills | Status: DC | PRN

## 2018-03-19 DIAGNOSIS — L814 Other melanin hyperpigmentation: Secondary | ICD-10-CM | POA: Diagnosis not present

## 2018-03-19 DIAGNOSIS — L570 Actinic keratosis: Secondary | ICD-10-CM | POA: Diagnosis not present

## 2018-03-19 DIAGNOSIS — D229 Melanocytic nevi, unspecified: Secondary | ICD-10-CM | POA: Diagnosis not present

## 2018-04-02 ENCOUNTER — Ambulatory Visit: Payer: Medicare Other | Admitting: Internal Medicine

## 2018-04-05 ENCOUNTER — Ambulatory Visit (INDEPENDENT_AMBULATORY_CARE_PROVIDER_SITE_OTHER): Payer: Medicare Other | Admitting: Internal Medicine

## 2018-04-05 ENCOUNTER — Encounter: Payer: Self-pay | Admitting: Internal Medicine

## 2018-04-05 VITALS — BP 122/68 | HR 79 | Temp 98.2°F | Ht 60.0 in | Wt 120.0 lb

## 2018-04-05 DIAGNOSIS — R1013 Epigastric pain: Secondary | ICD-10-CM

## 2018-04-05 DIAGNOSIS — Z23 Encounter for immunization: Secondary | ICD-10-CM | POA: Diagnosis not present

## 2018-04-05 DIAGNOSIS — E785 Hyperlipidemia, unspecified: Secondary | ICD-10-CM

## 2018-04-05 DIAGNOSIS — M48062 Spinal stenosis, lumbar region with neurogenic claudication: Secondary | ICD-10-CM

## 2018-04-05 DIAGNOSIS — F988 Other specified behavioral and emotional disorders with onset usually occurring in childhood and adolescence: Secondary | ICD-10-CM | POA: Diagnosis not present

## 2018-04-05 DIAGNOSIS — I2510 Atherosclerotic heart disease of native coronary artery without angina pectoris: Secondary | ICD-10-CM | POA: Diagnosis not present

## 2018-04-05 DIAGNOSIS — F324 Major depressive disorder, single episode, in partial remission: Secondary | ICD-10-CM

## 2018-04-05 DIAGNOSIS — M160 Bilateral primary osteoarthritis of hip: Secondary | ICD-10-CM

## 2018-04-05 MED ORDER — TRIAMCINOLONE ACETONIDE 0.5 % EX OINT: 1.0000 "application " | TOPICAL_OINTMENT | Freq: Two times a day (BID) | CUTANEOUS | 0 refills | Status: DC

## 2018-04-05 NOTE — Assessment & Plan Note (Signed)
TSH 

## 2018-04-05 NOTE — Assessment & Plan Note (Signed)
Coronary Calcification on CT. Ca score >200  Lipitor po

## 2018-04-05 NOTE — Assessment & Plan Note (Addendum)
S/p B THR CBD oil 10-20 mg twice a day

## 2018-04-05 NOTE — Patient Instructions (Addendum)
CBD oil 10-20 mg twice a day CoQ10 Sebamed

## 2018-04-05 NOTE — Progress Notes (Signed)
Subjective:  Patient ID: Courtney [name], female    DOB: Nov 06, 1950  Age: 68 y.o. MRN: [id number]  CC: No chief complaint on file.   HPI Courtney [name] presents for depression, ADD Cyndi Lennert, NP), RA, dyslipidemia f/u    Outpatient Medications Prior to Visit  Medication Sig Dispense Refill  . amphetamine-dextroamphetamine (ADDERALL) 10 MG tablet Take 10 mg by mouth daily as needed.    [name] aspirin 81 MG tablet Take 81 mg by mouth daily.    . Calcium Carb-Cholecalciferol (CALCIUM 600+D3 PO) Take 1,200 tablets by mouth daily.     . Cholecalciferol (VITAMIN D3) 2000 UNITS capsule Take 1 capsule (2,000 Units total) by mouth daily. 962 capsule 3  . folic acid (FOLVITE) 1 MG tablet Take 2 mg by mouth daily.     . meloxicam (MOBIC) 15 MG tablet Take 15 mg by mouth daily.    . traMADol (ULTRAM) 50 MG tablet Take 1 tablet (50 mg total) by mouth at bedtime as needed. 90 tablet 0  . vitamin E 400 UNIT capsule Take 400 Units by mouth daily.    . DULoxetine (CYMBALTA) 20 MG capsule TK 1 C PO QHS  1  . gabapentin (NEURONTIN) 100 MG capsule Take 100 mg by mouth 3 (three) times daily as needed (nerve pain).    [name] gabapentin (NEURONTIN) 600 MG tablet Take 1 tablet (600 mg total) by mouth 3 (three) times daily. (Patient not taking: Reported on 04/05/2018) 90 tablet 1  . atorvastatin (LIPITOR) 10 MG tablet Take 1 tablet (10 mg total) by mouth daily. 90 tablet 3   No facility-administered medications prior to visit.     ROS: Review of Systems  Constitutional: Negative for activity change, appetite change, chills, fatigue and unexpected weight change.  HENT: Negative for congestion, mouth sores and sinus pressure.   Eyes: Negative for visual disturbance.  Respiratory: Negative for cough and chest tightness.   Gastrointestinal: Negative for abdominal pain and nausea.  Genitourinary: Negative for difficulty urinating, frequency and vaginal pain.  Musculoskeletal: Positive for arthralgias and  gait problem. Negative for back pain.  Skin: Negative for pallor and rash.  Neurological: Negative for dizziness, tremors, weakness, numbness and headaches.  Psychiatric/Behavioral: Negative for confusion, sleep disturbance and suicidal ideas.    Objective:  BP 122/68 (BP Location: Left Arm, Patient Position: Sitting, Cuff Size: Normal)   Pulse 79   Temp 98.2 F (36.8 C) (Oral)   Ht 5' (1.524 m)   Wt 120 lb (54.4 kg)   LMP 01/08/1999   SpO2 98%   BMI 23.44 kg/m   BP Readings from Last 3 Encounters:  04/05/18 122/68  02/16/18 132/80  10/20/17 (!) 110/56    Wt Readings from Last 3 Encounters:  04/05/18 120 lb (54.4 kg)  02/16/18 121 lb 9.6 oz (55.2 kg)  10/20/17 116 lb (52.6 kg)    Physical Exam  Constitutional: She appears well-developed. No distress.  HENT:  Head: Normocephalic.  Right Ear: External ear normal.  Left Ear: External ear normal.  Nose: Nose normal.  Mouth/Throat: Oropharynx is clear and moist.  Eyes: Pupils are equal, round, and reactive to light. Conjunctivae are normal. Right eye exhibits no discharge. Left eye exhibits no discharge.  Neck: Normal range of motion. Neck supple. No JVD present. No tracheal deviation present. No thyromegaly present.  Cardiovascular: Normal rate, regular rhythm and normal heart sounds.  Pulmonary/Chest: No stridor. No respiratory distress. She has no wheezes.  Abdominal: Soft. Bowel sounds are normal.  She exhibits no distension and no mass. There is no tenderness. There is no rebound and no guarding.  Musculoskeletal: She exhibits no edema or tenderness.  Lymphadenopathy:    She has no cervical adenopathy.  Neurological: She displays normal reflexes. No cranial nerve deficit. She exhibits normal muscle tone. Coordination normal.  Skin: No rash noted. No erythema.  Psychiatric: She has a normal mood and affect. Her behavior is normal. Judgment and thought content normal.    Lab Results  Component Value Date   WBC 4.0  02/11/2018   HGB 14.8 02/11/2018   HCT 44 02/11/2018   PLT 315 02/11/2018   GLUCOSE 88 05/04/2015   CHOL 287 (A) 02/11/2018   TRIG 92 02/11/2018   HDL 102 (A) 02/11/2018   LDLDIRECT 120.7 08/04/2012   LDLCALC 165 02/11/2018   ALT 13 02/11/2018   AST 16 02/11/2018   NA 138 02/11/2018   K 4.5 02/11/2018   CL 103 05/04/2015   CREATININE 0.8 02/11/2018   BUN 13 02/11/2018   CO2 29 05/04/2015   TSH 1.58 02/11/2018   INR 0.96 01/08/2012   HGBA1C 5.4 02/11/2018    Ct Lumbar Spine W Contrast  Result Date: 04/17/2017 CLINICAL DATA:  67 year old female with scoliosis. Lumbar back pain radiating to the right leg and foot. EXAM: LUMBAR MYELOGRAM FLUOROSCOPY TIME:  1 minutes 6 seconds PROCEDURE: Lumbar puncture and intrathecal contrast administration were performed by Dr. Kristeen Miss who will separately report for the portion of the procedure. I personally supervised acquisition of the myelogram images. TECHNIQUE: Contiguous axial images were obtained through the Lumbar spine after the intrathecal infusion of infusion. Coronal and sagittal reconstructions were obtained of the axial image sets. COMPARISON:  Cervical and thoracolumbar radiographs 12/13/2015. Outside Lumbar MRI 08/04/2015 FINDINGS: LUMBAR MYELOGRAM FINDINGS: Transitional lumbosacral anatomy demonstrated on the August 2017 radiographs with full size ribs at T11, hypoplastic ribs at T12, and a fully sacralized L5 level. Correlation with radiographs is recommended prior to any operative intervention. Mild levoconvex lumbar curvature. On the lateral supine view grade 1 anterolisthesis is demonstrated at L2-L3, L3-L4 and L4-L5. L2 through L5 endplate sclerosis, disc space loss and endplate spurring. A high density intrathecal contrast was utilized, and was shifted within the thecal sac from the lower thoracic spine to the sacrum using gravity demonstrating no myelographic block. There are ventral defects on the thecal sac at all lower spinal  levels. Suggestion of mild spinal stenosis at L2-L3 and L4-L5. On the upright neutral view vertebral height and alignment remain stable. With flexion grade 1 anterolisthesis at L2-L2024-06-01be mildly more pronounced. With extension the anterolisthesis there appears diminished. No abnormal motion identified at the other lumbar levels. CT LUMBAR MYELOGRAM FINDINGS: Sacralized L5 level, same numbering system as above. Negative visualize costophrenic angles. Negative visualized noncontrast abdominal viscera. Mild Aortoiliac calcified atherosclerosis. Negative visualized posterior paraspinal soft tissues. Levoconvex lower lumbar scoliosis with grade 1 anterolisthesis L2-L3 through L4-L5. No pars defect. No acute osseous abnormality identified. Intact visible sacrum and SI joints. Normal myelographic appearance of the lower thoracic spinal cord with conus at T12-L1. Good intrathecal contrast opacification. T10-T11: Circumferential disc bulge and mild facet hypertrophy. Possible mild to moderate left T10 foraminal stenosis but no spinal stenosis. T11-12:  Minimal disc bulge.  Otherwise negative. T12-L1: Disc space loss with circumferential disc bulge. Broad-based posterior and foraminal component. No spinal stenosis. Mild to moderate T12 neural foraminal stenosis greater on the left. L1-L2: Mild vacuum disc. Circumferential disc bulge with broad-based posterior  and foraminal component. No spinal stenosis. Mild right L1 foraminal stenosis. L2-L3: Grade 1 anterolisthesis with severe disc space loss. Vacuum disc. Circumferential disc bulge with broad-based right subarticular component (series 4, image 61). Moderate to severe facet hypertrophy, right side vacuum facet. Mild spinal stenosis. Borderline to mild left and mild to moderate right lateral recess stenosis at the L3 nerve level. Moderate to severe left L2 foraminal stenosis. Mild right foraminal stenosis. L3-L4: Grade 1 anterolisthesis with severe disc space loss and  vacuum disc. Circumferential disc bulge with broad-based posterior component. Moderate to severe facet hypertrophy with bilateral vacuum facet. No significant spinal or lateral recess stenosis. Mild left but moderate to severe right L3 neural foraminal stenosis. L4-L5: Grade 1 anterolisthesis with severe disc space loss and vacuum disc. Circumferential disc bulge with broad-based posterior and biforaminal components. No spinal or lateral recess stenosis, but moderate to severe right and mild to moderate left L4 neural foraminal stenosis. L5-S1: Fully sacralized. There is a vestigial disc space. No stenosis. IMPRESSION: LUMBAR MYELOGRAM IMPRESSION: 1. 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 Correlation with radiographs is recommended prior to any operative intervention. 2. Multilevel lumbar grade 1 spondylolisthesis. Suggestion of instability at L2-L3 on flexion/extension; grade 1 anterolisthesis at that level which appears exacerbated in flexion and partially reduced in extension. 3. No other abnormal motion in flexion/extension. 4. Levoconvex lumbar scoliosis. Advanced lumbar endplate degeneration. CT LUMBAR MYELOGRAM IMPRESSION: 1. Transitional lumbosacral anatomy as stated above. 2. Advanced disc degeneration at L2-L3 with vacuum disc. Right subarticular broad-based component contributing to moderate right lateral recess stenosis an mild spinal stenosis. Superimposed multifactorial moderate to severe right neural foraminal stenosis at L3-L4. Query right L3 radiculitis. 3. No other significant lumbar spinal stenosis, but there is moderate or severe multifactorial neural foraminal stenosis also at the right L4 nerve level at L4-L5. Electronically Signed   By: Genevie Ann M.D.   On: 04/17/2017 12:05   Dg Myelogram Lumbar  Result Date: 04/17/2017 CLINICAL DATA:  67 year old female with scoliosis. Lumbar back pain radiating  to the right leg and foot. EXAM: LUMBAR MYELOGRAM FLUOROSCOPY TIME:  1 minutes 6 seconds PROCEDURE: Lumbar puncture and intrathecal contrast administration were performed by Dr. Kristeen Miss who will separately report for the portion of the procedure. I personally supervised acquisition of the myelogram images. TECHNIQUE: Contiguous axial images were obtained through the Lumbar spine after the intrathecal infusion of infusion. Coronal and sagittal reconstructions were obtained of the axial image sets. COMPARISON:  Cervical and thoracolumbar radiographs 12/13/2015. Outside Lumbar MRI 08/04/2015 FINDINGS: LUMBAR MYELOGRAM FINDINGS: Transitional lumbosacral anatomy demonstrated on the August 2017 radiographs with full size ribs at T11, hypoplastic ribs at T12, and a fully sacralized L5 level. Correlation with radiographs is recommended prior to any operative intervention. Mild levoconvex lumbar curvature. On the lateral supine view grade 1 anterolisthesis is demonstrated at L2-L3, L3-L4 and L4-L5. L2 through L5 endplate sclerosis, disc space loss and endplate spurring. A high density intrathecal contrast was utilized, and was shifted within the thecal sac from the lower thoracic spine to the sacrum using gravity demonstrating no myelographic block. There are ventral defects on the thecal sac at all lower spinal levels. Suggestion of mild spinal stenosis at L2-L3 and L4-L5. On the upright neutral view vertebral height and alignment remain stable. With flexion grade 1 anterolisthesis at L2-L05/12/24be mildly more pronounced. With extension the anterolisthesis there appears diminished. No abnormal motion  identified at the other lumbar levels. CT LUMBAR MYELOGRAM FINDINGS: Sacralized L5 level, same numbering system as above. Negative visualize costophrenic angles. Negative visualized noncontrast abdominal viscera. Mild Aortoiliac calcified atherosclerosis. Negative visualized posterior paraspinal soft tissues. Levoconvex  lower lumbar scoliosis with grade 1 anterolisthesis L2-L3 through L4-L5. No pars defect. No acute osseous abnormality identified. Intact visible sacrum and SI joints. Normal myelographic appearance of the lower thoracic spinal cord with conus at T12-L1. Good intrathecal contrast opacification. T10-T11: Circumferential disc bulge and mild facet hypertrophy. Possible mild to moderate left T10 foraminal stenosis but no spinal stenosis. T11-12:  Minimal disc bulge.  Otherwise negative. T12-L1: Disc space loss with circumferential disc bulge. Broad-based posterior and foraminal component. No spinal stenosis. Mild to moderate T12 neural foraminal stenosis greater on the left. L1-L2: Mild vacuum disc. Circumferential disc bulge with broad-based posterior and foraminal component. No spinal stenosis. Mild right L1 foraminal stenosis. L2-L3: Grade 1 anterolisthesis with severe disc space loss. Vacuum disc. Circumferential disc bulge with broad-based right subarticular component (series 4, image 61). Moderate to severe facet hypertrophy, right side vacuum facet. Mild spinal stenosis. Borderline to mild left and mild to moderate right lateral recess stenosis at the L3 nerve level. Moderate to severe left L2 foraminal stenosis. Mild right foraminal stenosis. L3-L4: Grade 1 anterolisthesis with severe disc space loss and vacuum disc. Circumferential disc bulge with broad-based posterior component. Moderate to severe facet hypertrophy with bilateral vacuum facet. No significant spinal or lateral recess stenosis. Mild left but moderate to severe right L3 neural foraminal stenosis. L4-L5: Grade 1 anterolisthesis with severe disc space loss and vacuum disc. Circumferential disc bulge with broad-based posterior and biforaminal components. No spinal or lateral recess stenosis, but moderate to severe right and mild to moderate left L4 neural foraminal stenosis. L5-S1: Fully sacralized. There is a vestigial disc space. No stenosis.  IMPRESSION: LUMBAR MYELOGRAM IMPRESSION: 1. 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 Correlation with radiographs is recommended prior to any operative intervention. 2. Multilevel lumbar grade 1 spondylolisthesis. Suggestion of instability at L2-L3 on flexion/extension; grade 1 anterolisthesis at that level which appears exacerbated in flexion and partially reduced in extension. 3. No other abnormal motion in flexion/extension. 4. Levoconvex lumbar scoliosis. Advanced lumbar endplate degeneration. CT LUMBAR MYELOGRAM IMPRESSION: 1. Transitional lumbosacral anatomy as stated above. 2. Advanced disc degeneration at L2-L3 with vacuum disc. Right subarticular broad-based component contributing to moderate right lateral recess stenosis an mild spinal stenosis. Superimposed multifactorial moderate to severe right neural foraminal stenosis at L3-L4. Query right L3 radiculitis. 3. No other significant lumbar spinal stenosis, but there is moderate or severe multifactorial neural foraminal stenosis also at the right L4 nerve level at L4-L5. Electronically Signed   By: Genevie Ann M.D.   On: 04/17/2017 12:05    Assessment & Plan:   There are no diagnoses linked to this encounter.   No orders of the defined types were placed in this encounter.    Follow-up: No follow-ups on file.  Walker Kehr, MD

## 2018-04-05 NOTE — Assessment & Plan Note (Signed)
Lipitor 

## 2018-04-05 NOTE — Assessment & Plan Note (Signed)
S/p B THR 

## 2018-04-05 NOTE — Assessment & Plan Note (Signed)
F/u w/Karen Ronnald Ramp, NP Adderall

## 2018-04-05 NOTE — Assessment & Plan Note (Addendum)
F/u w/Dr Oneida Alar Gabapentin

## 2018-04-05 NOTE — Assessment & Plan Note (Signed)
F/u w/Karen Ronnald Ramp, NP Cymbalta given 2019

## 2018-05-04 ENCOUNTER — Other Ambulatory Visit: Payer: Medicare Other

## 2018-05-06 ENCOUNTER — Ambulatory Visit: Payer: Medicare Other

## 2018-05-20 DIAGNOSIS — Z6822 Body mass index (BMI) 22.0-22.9, adult: Secondary | ICD-10-CM | POA: Diagnosis not present

## 2018-05-20 DIAGNOSIS — M4316 Spondylolisthesis, lumbar region: Secondary | ICD-10-CM | POA: Diagnosis not present

## 2018-05-20 DIAGNOSIS — H3561 Retinal hemorrhage, right eye: Secondary | ICD-10-CM | POA: Diagnosis not present

## 2018-05-20 DIAGNOSIS — M48062 Spinal stenosis, lumbar region with neurogenic claudication: Secondary | ICD-10-CM | POA: Diagnosis not present

## 2018-05-20 DIAGNOSIS — M4716 Other spondylosis with myelopathy, lumbar region: Secondary | ICD-10-CM | POA: Diagnosis not present

## 2018-05-21 ENCOUNTER — Other Ambulatory Visit: Payer: Medicare Other | Admitting: *Deleted

## 2018-05-21 ENCOUNTER — Encounter (INDEPENDENT_AMBULATORY_CARE_PROVIDER_SITE_OTHER): Payer: Self-pay

## 2018-05-21 DIAGNOSIS — E785 Hyperlipidemia, unspecified: Secondary | ICD-10-CM | POA: Diagnosis not present

## 2018-05-21 LAB — LIPID PANEL
Chol/HDL Ratio: 2.1 ratio (ref 0.0–4.4)
Cholesterol, Total: 235 mg/dL — ABNORMAL HIGH (ref 100–199)
HDL: 114 mg/dL (ref >39)
LDL Calculated: 108 mg/dL — ABNORMAL HIGH (ref 0–99)
Triglycerides: 66 mg/dL (ref 0–149)
VLDL Cholesterol Cal: 13 mg/dL (ref 5–40)

## 2018-05-21 LAB — HEPATIC FUNCTION PANEL
ALT: 14 IU/L (ref 0–32)
AST: 17 IU/L (ref 0–40)
Albumin: 4.3 g/dL (ref 3.8–4.8)
Alkaline Phosphatase: 49 IU/L (ref 39–117)
Bilirubin Total: 0.3 mg/dL (ref 0.0–1.2)
Bilirubin, Direct: 0.09 mg/dL (ref 0.00–0.40)
Total Protein: 6.4 g/dL (ref 6.0–8.5)

## 2018-05-26 ENCOUNTER — Encounter: Payer: Self-pay | Admitting: Pharmacist

## 2018-05-26 ENCOUNTER — Ambulatory Visit (INDEPENDENT_AMBULATORY_CARE_PROVIDER_SITE_OTHER): Payer: Medicare Other | Admitting: Pharmacist

## 2018-05-26 DIAGNOSIS — E785 Hyperlipidemia, unspecified: Secondary | ICD-10-CM

## 2018-05-26 MED ORDER — ROSUVASTATIN CALCIUM 5 MG PO TABS: 5.0000 mg | ORAL_TABLET | ORAL | 3 refills | Status: DC

## 2018-05-26 NOTE — Progress Notes (Signed)
Patient ID: Courtney [name]                 DOB: May 11, 1950                    MRN: [id number]     HPI: Courtney [name] is a 68 y.o. female patient of Dr. Thompson Caul that presents today for lipid evaluation.  PMH includes family hx of CAD, asymptomatic CAD, and hyperlipidemia. She has a coronary calcium score of 200. Patient has been tried on a statins several times. At last visit she was started on atorvastatin 10mg  daily.  Patient presents today for follow up. She expressed frustration that she was not informed she would not be seeing the same pharmacist she previously saw. Apologized that she was not informed, we do our best to continue to follow the same patients, but sometimes the schedule does not allow.   She states that she was not tolerating atorvastatin 10mg  daily well. Her PCP told her that she could take it a few times a week instead. So she has been taking 1/2 tablet (5mg ) about 3-4 times a week. Tolerating this regimen better.  Risk Factors: Family hx of CAD, LDL >100, coronary calcium score, ASCVD risk: 8.9% LDL Goal: <70 mg/dL  Current Medications: aspirin 81 mg daily, atorvastatin 5mg  3-4 times a week Intolerances: rosuvastatin (20 mg, 5 mg; myalgia), atorvastatin 10mg  daily (myalgias)  Diet: most meals are from home. She does eat meat, fish 1-2x/month, red meat, pork, and chicken. Does not eat fried foods. Uses olive oil when cooking, adds butter if she needs to increase the smoke point of her cooking medium. Uses smart balance in place of butter for toast. Patient is gluten free.  Exercise: Unable to exercise d/t sciatica and lumbar spine issues   Family History: CAD (unknown relative), MI (mother), heart disease (mother), prostate cancer (father)  Labs: Lipid panel (05/21/18) : TC 235, HLD 114, LDL 108 (atorvastatin 5mg  3-4x week) Lipid panel (02/12/18): TC 287, HDL 102, LDL 165 (no therapy)  Coronary Calcium score (06/13/15): 48 (81st percentile for age and  sex matched control)  Past Medical History:  Diagnosis Date  . ADD (attention deficit disorder)   . Anxiety   . Celiac disease    possible vs IBS  . Depression    no bipolar per Dr. [name]  . GI problem    [name]  . Hyperlipidemia   . IBS (irritable bowel syndrome)   . Osteoarthritis   . Osteoporosis   . Seasonal allergies 01-08-12   hx. of multiple bronchitis related to this.  . Shoulder pain, right     Current Outpatient Medications on File Prior to Visit  Medication Sig Dispense Refill  . amphetamine-dextroamphetamine (ADDERALL) 10 MG tablet Take 10 mg by mouth daily as needed.    [name] aspirin 81 MG tablet Take 81 mg by mouth daily.    . Calcium Carb-Cholecalciferol (CALCIUM 600+D3 PO) Take 1,200 tablets by mouth daily.     . Cholecalciferol (VITAMIN D3) 2000 UNITS capsule Take 1 capsule (2,000 Units total) by mouth daily. 100 capsule 3  . DULoxetine (CYMBALTA) 20 MG capsule TK 1 C PO QHS  1  . folic acid (FOLVITE) 1 MG tablet Take 2 mg by mouth daily.     [name] gabapentin (NEURONTIN) 100 MG capsule Take 100 mg by mouth 3 (three) times daily as needed (nerve pain).    [name] gabapentin (NEURONTIN) 600 MG tablet Take 1 tablet (600 mg  total) by mouth 3 (three) times daily. (Patient not taking: Reported on 04/05/2018) 90 tablet 1  . meloxicam (MOBIC) 15 MG tablet Take 15 mg by mouth daily.    . traMADol (ULTRAM) 50 MG tablet Take 1 tablet (50 mg total) by mouth at bedtime as needed. 90 tablet 0  . triamcinolone ointment (KENALOG) 0.5 % Apply 1 application topically 2 (two) times daily. 60 g 0  . vitamin E 400 UNIT capsule Take 400 Units by mouth daily.     No current facility-administered medications on file prior to visit.     Allergies  Allergen Reactions  . Bactrim [Sulfamethoxazole-Trimethoprim] Nausea And Vomiting  . Morphine Nausea And Vomiting  . Morphine And Related Nausea And Vomiting  . Ceclor [Cefaclor] Nausea And Vomiting    Can take Augmentin ok  . Clarithromycin Nausea  Only    REACTION: nausea  . Codeine Nausea And Vomiting  . Erythromycin Nausea And Vomiting  . Levofloxacin Nausea Only    REACTION: nausea  . Loratadine     REACTION: bruises  . Nitrofurantoin Nausea Only  . Propoxyphene Nausea Only    dizzy  . Propoxyphene N-Acetaminophen Nausea Only    dizzy  . Zithromax [Azithromycin] Nausea And Vomiting    Assessment/Plan: Hyperlipidemia: Based on her most recent lipid panel, LDL is not at goal <70. Patient was unable to take atorvastatin 10mg  daily due to muscle pains. She decreased the frequency she was taking the medication per advice from her PCP. However atorvastatin is not the ideal statin to try this regimen with due to its short half life. Rosuvastatin is the ideal medication to use due to its longer half life. Patient was unable to tolerate rosuvastatin 5mg  daily, but is willing to try it twice a week. Start rosuvastatin 5mg  twice a week and check lipid panel and LFT in 12 weeks.   Discussed the importance of statins on cardiovascular risk reduction and its role as the backbone of therapy. Reviewed lipid results with patient and emphasized our focus on LDL reduction. Provided print out per pt request. Also provided handouts on low cholesterol/heart healthy diet.    If LDL is not at goal at next visit, could consider increasing rosuvastatin to 3-4 times a week or adding zetia or a PCSK9 inhibitor.   Thank you,   Ramond Dial, Pharm.D, [place]  [address] [place], [place] [address]  Phone: [phone]; Fax: [phone]

## 2018-05-26 NOTE — Patient Instructions (Addendum)
Start taking Crestor 5mg  twice a week. Stop taking atorvastatin 10mg .  Call us at [phone] with any questions or concerns or if you are unable to tolerate your medication.

## 2018-06-08 DIAGNOSIS — F900 Attention-deficit hyperactivity disorder, predominantly inattentive type: Secondary | ICD-10-CM | POA: Diagnosis not present

## 2018-06-08 DIAGNOSIS — F3342 Major depressive disorder, recurrent, in full remission: Secondary | ICD-10-CM | POA: Diagnosis not present

## 2018-06-08 DIAGNOSIS — F411 Generalized anxiety disorder: Secondary | ICD-10-CM | POA: Diagnosis not present

## 2018-06-11 ENCOUNTER — Telehealth: Payer: Self-pay | Admitting: Interventional Cardiology

## 2018-06-11 NOTE — Telephone Encounter (Signed)
  Patient states that she has started going back to the gym and she has experienced a very high heart rate that made her feel bad while working out. She is concerned because this has not happened before. She is not sure if she needs to be seen or not.

## 2018-06-11 NOTE — Telephone Encounter (Signed)
Pt states she went to the gym on Monday and her HR jumped up to 196 while on the elliptical.  She states she was on a different machine just prior and started feeling bad in general and was lightheaded so she went over to the elliptical because it has the capabilities to check your HR.  Pt then got off the machine and it took about 5 mins for symptoms to resolve. Went to a machine at the desk that can check HR and BP.  Pt states BP was fine (didn't have value) and HR was back down to 88.  Went back to the gym Wednesday and had no issues.  She then mentioned that over the last couple of months she has noticed lightheadedness and increased SOB when exerting.  Denies CP.  Scheduled pt to see Truitt Merle, NP on Monday, 2/17.  Advised I would send message to Dr. Tamala Julian and see if he had any further recommendations in the meantime. Pt appreciative for call.

## 2018-06-12 NOTE — Telephone Encounter (Signed)
Probably had transient ?SVT/Arrhythmia

## 2018-06-13 ENCOUNTER — Emergency Department (HOSPITAL_COMMUNITY)
Admission: EM | Admit: 2018-06-13 | Discharge: 2018-06-14 | Disposition: A | Discharge status: Home / Self Care | Payer: Medicare Other | Attending: Emergency Medicine | Admitting: Emergency Medicine

## 2018-06-13 ENCOUNTER — Encounter (HOSPITAL_COMMUNITY): Payer: Self-pay | Admitting: Emergency Medicine

## 2018-06-13 DIAGNOSIS — I2510 Atherosclerotic heart disease of native coronary artery without angina pectoris: Secondary | ICD-10-CM | POA: Diagnosis not present

## 2018-06-13 DIAGNOSIS — R110 Nausea: Secondary | ICD-10-CM | POA: Diagnosis not present

## 2018-06-13 DIAGNOSIS — Z79899 Other long term (current) drug therapy: Secondary | ICD-10-CM | POA: Diagnosis not present

## 2018-06-13 DIAGNOSIS — R42 Dizziness and giddiness: Secondary | ICD-10-CM | POA: Diagnosis not present

## 2018-06-13 DIAGNOSIS — Z96643 Presence of artificial hip joint, bilateral: Secondary | ICD-10-CM | POA: Diagnosis not present

## 2018-06-13 DIAGNOSIS — R079 Chest pain, unspecified: Secondary | ICD-10-CM | POA: Diagnosis not present

## 2018-06-13 NOTE — ED Provider Notes (Signed)
[facility] Provider Note   CSN: [id number] Arrival date & time: 06/13/18  2251     History   Chief Complaint Chief Complaint  Patient presents with  . Chest Pain    HPI Courtney [name] is a 68 y.o. female.  Patient is a 68 year old female with past medical history of hyperlipidemia, anxiety, osteoporosis.  She presents today for evaluation of chest discomfort.  This began this evening at approximately 9 PM after walking upstairs to go to bed.  She describes a pressure in the center of her chest that she initially rated as a 7 or 8 out of 10.  She states the discomfort radiated to the right side of her neck but was not associated with any nausea or diaphoresis.  She denies shortness of breath, but does describe feeling "winded".  Patient does describe a similar episode that occurred while working out at the gym earlier this week.  This episode lasted approximately 25 minutes, then resolved with rest.  She was given nitroglycerin by EMS which helped somewhat.  She was also given 4 baby aspirin.  She has no prior cardiac history with the exception of a stress test sometime in her 75s.  She does describe a strong family history on her mother's side.  The history is provided by the patient.  Chest Pain  Pain location:  Substernal area Pain quality: pressure   Pain radiates to:  R jaw Pain severity:  Moderate Onset quality:  Sudden Duration:  2 hours Timing:  Constant Progression:  Improving Chronicity:  New Relieved by:  Nitroglycerin Worsened by:  Nothing   Past Medical History:  Diagnosis Date  . ADD (attention deficit disorder)   . Anxiety   . Celiac disease    possible vs IBS  . Depression    no bipolar per Dr. [name]  . GI problem    [name]  . Hyperlipidemia   . IBS (irritable bowel syndrome)   . Osteoarthritis   . Osteoporosis   . Seasonal allergies 01-08-12   hx. of multiple bronchitis related to this.  . Shoulder  pain, right     Patient Active Problem List   Diagnosis Date Noted  . 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  . Spinal stenosis, lumbar region, 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  . Sciatica of right side associated with disorder of lumbar spine 02/26/2017  . CAD in native artery 12/23/2016  . Abdominal pain, epigastric 05/09/2015  . Dry skin dermatitis 08/04/2012  . Constipation - functional 08/04/2012  . History of hypothyroidism 08/04/2012  . Otitis media, serous 12/25/2010  . OSTEOARTHRITIS, HIPS, BILATERAL 06/20/2010  . ANXIETY 05/03/2009  . TOBACCO USE, QUIT 05/03/2009  . ONYCHOMYCOSIS 06/28/2008  . Actinic keratosis 06/28/2008  . Bilateral carotid bruits 06/28/2008  . Attention deficit disorder 05/17/2007  . Irritable bowel syndrome 05/17/2007  . Dyslipidemia 02/28/2007  . Depression 02/28/2007  . Osteoarthritis 02/28/2007    Past Surgical History:  Procedure Laterality Date  . EXPLORATORY LAPAROTOMY  01-08-12   due to endometriosis-portions of both ovaries removed  . TONSILLECTOMY    . TOTAL HIP ARTHROPLASTY     right. 2002  . TOTAL HIP ARTHROPLASTY  01/14/2012   Procedure: TOTAL HIP ARTHROPLASTY;  Surgeon: Gearlean Alf, MD;  Location: [facility];  Service: Orthopedics;  Laterality: Left;  [name] VENTRAL HERNIA REPAIR  01-08-12   abdominal     OB History   No obstetric history on file.      Home  Medications    Prior to Admission medications   Medication Sig Start Date End Date Taking? Authorizing Provider  amphetamine-dextroamphetamine (ADDERALL) 10 MG tablet Take 10 mg by mouth daily as needed.    [provider]  aspirin 81 MG tablet Take 81 mg by mouth daily.    [provider]  Calcium Carb-Cholecalciferol (CALCIUM 600+D3 PO) Take 1,200 tablets by mouth daily.     [provider]  Cholecalciferol (VITAMIN D3) 2000 UNITS capsule Take 1 capsule (2,000 Units total) by mouth daily. 04/20/15   Plotnikov, Evie Lacks, MD    DULoxetine (CYMBALTA) 20 MG capsule TK 1 C PO QHS 03/03/18   [provider]  folic acid (FOLVITE) 1 MG tablet Take 2 mg by mouth daily.     [provider]  gabapentin (NEURONTIN) 100 MG capsule Take 100 mg by mouth 3 (three) times daily as needed (nerve pain).    [provider]  gabapentin (NEURONTIN) 600 MG tablet Take 1 tablet (600 mg total) by mouth 3 (three) times daily. Patient not taking: Reported on 04/05/2018 09/10/17   Stefanie Libel, MD  meloxicam (MOBIC) 15 MG tablet Take 15 mg by mouth daily.    [provider]  rosuvastatin (CRESTOR) 5 MG tablet Take 1 tablet (5 mg total) by mouth 2 (two) times a week. 05/27/18   Belva Crome, MD  traMADol (ULTRAM) 50 MG tablet Take 1 tablet (50 mg total) by mouth at bedtime as needed. 03/09/18   Stefanie Libel, MD  triamcinolone ointment (KENALOG) 0.5 % Apply 1 application topically 2 (two) times daily. 04/05/18 04/05/19  Plotnikov, Evie Lacks, MD  vitamin E 400 UNIT capsule Take 400 Units by mouth daily.    [provider]    Family History Family History  Problem Relation Age of Onset  . Heart disease Mother   . Heart attack Mother   . Prostate cancer Father   . Coronary artery disease Unknown        FH Female 1st degree relative <60  . ADD / ADHD Unknown     Social History Social History   Tobacco Use  . Smoking status: Never Smoker  . Smokeless tobacco: Never Used  . Tobacco comment: only social  Substance Use Topics  . Alcohol use: Yes    Alcohol/week: 0.0 standard drinks    Comment: rare social   . Drug use: No     Allergies   Bactrim [sulfamethoxazole-trimethoprim]; Morphine; Morphine and related; Ceclor [cefaclor]; Clarithromycin; Codeine; Erythromycin; Levofloxacin; Loratadine; Nitrofurantoin; Propoxyphene; Propoxyphene n-acetaminophen; and Zithromax [azithromycin]   Review of Systems Review of Systems  Cardiovascular: Positive for chest pain.  All other systems reviewed and  are negative.    Physical Exam Updated Vital Signs BP 124/68 (BP Location: Right Arm)   Pulse 69   Temp 98.2 F (36.8 C) (Oral)   Resp 18   Ht 5' (1.524 m)   Wt 54.4 kg   LMP 01/08/1999   SpO2 100%   BMI 23.44 kg/m   Physical Exam Vitals signs and nursing note reviewed.  Constitutional:      General: She is not in acute distress.    Appearance: She is well-developed. She is not diaphoretic.  HENT:     Head: Normocephalic and atraumatic.  Neck:     Musculoskeletal: Normal range of motion and neck supple.  Cardiovascular:     Rate and Rhythm: Normal rate and regular rhythm.     Heart sounds: No murmur. No friction  rub. No gallop.   Pulmonary:     Effort: Pulmonary effort is normal. No respiratory distress.     Breath sounds: Normal breath sounds. No wheezing.  Abdominal:     General: Bowel sounds are normal. There is no distension.     Palpations: Abdomen is soft.     Tenderness: There is no abdominal tenderness.  Musculoskeletal: Normal range of motion.  Skin:    General: Skin is warm and dry.  Neurological:     Mental Status: She is alert and oriented to person, place, and time.      ED Treatments / Results  Labs (all labs ordered are listed, but only abnormal results are displayed) Labs Reviewed - No data to display  EKG EKG Interpretation  Date/Time:  Sunday June 13 2018 22:58:19 EST Ventricular Rate:  68 PR Interval:    QRS Duration: 73 QT Interval:  397 QTC Calculation: 423 R Axis:   62 Text Interpretation:  Sinus rhythm Normal ECG Confirmed by Veryl Speak [phone]) on 06/13/2018 11:02:10 PM   Radiology No results found.  Procedures Procedures (including critical care time)  Medications Ordered in ED Medications - No data to display   Initial Impression / Assessment and Plan / ED Course  I have reviewed the triage vital signs and the nursing notes.  Pertinent labs & imaging results that were available during my care of the patient  were reviewed by me and considered in my medical decision making (see chart for details).  Patient presents with complaints of chest discomfort, the etiology of which is a am uncertain.  There is no evidence for a cardiac etiology.  Her EKG is unchanged and troponin x2 is negative.  She does have a follow-up appointment this week with cardiology and I have advised her to keep this appointment.  She is to do no strenuous activity until cardiology sees her.  She is to return in the meantime if symptoms worsen or change.  Final Clinical Impressions(s) / ED Diagnoses   Final diagnoses:  None    ED Discharge Orders    None       Veryl Speak, MD 06/14/18 [id number]

## 2018-06-13 NOTE — ED Triage Notes (Signed)
  Patient BIB EMS for CP and pressure that started around 2100.  Patient states she was getting ready for bed and started having mid sternal chest pain with radiation to R neck.  Patient states she was lightheaded and nauseous but no vomiting.  No hx of cardiac issues.  Was given 324 ASA and 1 nitro via EMS.  Pain 4/10.

## 2018-06-14 ENCOUNTER — Emergency Department (HOSPITAL_COMMUNITY): Payer: Medicare Other

## 2018-06-14 ENCOUNTER — Ambulatory Visit (INDEPENDENT_AMBULATORY_CARE_PROVIDER_SITE_OTHER): Payer: Medicare Other | Admitting: Cardiology

## 2018-06-14 ENCOUNTER — Encounter: Payer: Self-pay | Admitting: Cardiology

## 2018-06-14 VITALS — BP 118/78 | HR 79 | Ht 60.0 in | Wt 120.4 lb

## 2018-06-14 DIAGNOSIS — I200 Unstable angina: Secondary | ICD-10-CM

## 2018-06-14 DIAGNOSIS — E785 Hyperlipidemia, unspecified: Secondary | ICD-10-CM

## 2018-06-14 DIAGNOSIS — R079 Chest pain, unspecified: Secondary | ICD-10-CM | POA: Diagnosis not present

## 2018-06-14 LAB — CBC WITH DIFFERENTIAL/PLATELET
Abs Immature Granulocytes: 0.01 10*3/uL (ref 0.00–0.07)
Basophils Absolute: 0 10*3/uL (ref 0.0–0.1)
Basophils Relative: 0 %
Eosinophils Absolute: 0.2 10*3/uL (ref 0.0–0.5)
Eosinophils Relative: 3 %
HCT: 35.6 % — ABNORMAL LOW (ref 36.0–46.0)
Hemoglobin: 12.1 g/dL (ref 12.0–15.0)
Immature Granulocytes: 0 %
Lymphocytes Relative: 39 %
Lymphs Abs: 2 10*3/uL (ref 0.7–4.0)
MCH: 32.2 pg (ref 26.0–34.0)
MCHC: 34 g/dL (ref 30.0–36.0)
MCV: 94.7 fL (ref 80.0–100.0)
Monocytes Absolute: 0.7 10*3/uL (ref 0.1–1.0)
Monocytes Relative: 13 %
Neutro Abs: 2.2 10*3/uL (ref 1.7–7.7)
Neutrophils Relative %: 45 %
Platelets: 258 10*3/uL (ref 150–400)
RBC: 3.76 MIL/uL — ABNORMAL LOW (ref 3.87–5.11)
RDW: 11.9 % (ref 11.5–15.5)
WBC: 5 10*3/uL (ref 4.0–10.5)
nRBC: 0 % (ref 0.0–0.2)

## 2018-06-14 LAB — COMPREHENSIVE METABOLIC PANEL
ALT: 15 U/L (ref 0–44)
AST: 25 U/L (ref 15–41)
Albumin: 3.7 g/dL (ref 3.5–5.0)
Alkaline Phosphatase: 40 U/L (ref 38–126)
Anion gap: 10 (ref 5–15)
BUN: 15 mg/dL (ref 8–23)
CO2: 24 mmol/L (ref 22–32)
Calcium: 8.9 mg/dL (ref 8.9–10.3)
Chloride: 92 mmol/L — ABNORMAL LOW (ref 98–111)
Creatinine, Ser: 0.77 mg/dL (ref 0.44–1.00)
GFR calc Af Amer: >60 mL/min (ref >60)
GFR calc non Af Amer: >60 mL/min (ref >60)
Glucose, Bld: 89 mg/dL (ref 70–99)
Potassium: 4 mmol/L (ref 3.5–5.1)
Sodium: 126 mmol/L — ABNORMAL LOW (ref 135–145)
Total Bilirubin: 0.5 mg/dL (ref 0.3–1.2)
Total Protein: 6 g/dL — ABNORMAL LOW (ref 6.5–8.1)

## 2018-06-14 LAB — TROPONIN I
Troponin I: <0.03 ng/mL (ref <0.03)
Troponin I: <0.03 ng/mL (ref <0.03)

## 2018-06-14 MED ORDER — NITROGLYCERIN 0.4 MG SL SUBL: 0.4000 mg | SUBLINGUAL_TABLET | SUBLINGUAL | 2 refills | Status: AC | PRN

## 2018-06-14 NOTE — Telephone Encounter (Signed)
Spoke with pt and advised her may have been an episode of SVT.  Advised to continue with plan from appt today.  Pt appreciative for call.

## 2018-06-14 NOTE — Progress Notes (Addendum)
Cardiology Office Note   Date:  06/14/2018   ID:  [name], DOB Jul 11, 1950, MRN [id number]  PCP:  Cassandria Anger, MD  Cardiologist: Dr. Daneen Schick, MD   Chief Complaint  Patient presents with  . Chest Pain  . Follow-up    History of Present Illness: Courtney [name] is a 68 y.o. female who presents for follow up for chest pain, seen for Dr. Tamala Julian.   Courtney [name] has a strong family hx of CAD and hyperlipidemia. She was last seen by Dr. Tamala Julian on 02/16/2018 for follow up. At that time she had no cardiac complaints. She was noted to be intolerant to statins. Per chart, they have been tried twice and she had significant muscle aching and pain.   She was seen in the ED yesterday, 06/13/2018 for anginal symptoms. She states that last week she was at the gym and was doing some isometric exercises when she suddenly became dizzy. She stood up and went over to a machine that had HR capabilities which read that her HR was 190bpm. She had no palpitations. She then went to another machine several minutes later with a reading in the 80's. At that time, she denied chest pain, N/V, diaphoresis. She had been in her usual state of health up until that point. She was recently placed on Cymbalta and was having difficulty with nausea related to this.   On day of ED presentation, she was on her way up to bed when she had similar symptoms with dizziness however she had associated chest pressure which radiated to her right neck area. Again, she denied N/V, diaphoresis, or shortness of breath. Given her symptoms and known risk factors, she proceeded to call EMS for further evaluation. She was given SL NTG with relief per EMS and was chest pain free on arrival.   On ED evaluation, there was no evidence for a cardiac etiology. Her EKG was noted to be unchanged from prior tracings and troponin x2 is negative. She was discharged from the ED without cardiology consultation given that she  was to have follow up today which had already been scheduled. She was instructed not to perform strenuous activity until cardiology sees her.    Today, she denies recurrent chest pressure, however was only discharged from the ED at 0300 this AM. Given her strong family history and single episode of what sounds like unstable angina, we will plan for exercise stress test for further evaluation. She reports no concern with ambulating on the treadmill. She states that in regards to her recent prescription of Cymbalta, she has noticed more dehydration symptoms and re[ported that on Sunday she drank up to 12 glasses of water given this. She did undergo a CT calcium score in 2017 which was noted to elavted for age and sex control at 40. Up until this point she has been symptom free.   Pt seen in conjunction with Truitt Merle, NP.   Past Medical History:  Diagnosis Date  . ADD (attention deficit disorder)   . Anxiety   . Celiac disease    possible vs IBS  . Depression    no bipolar per Dr. [name]  . GI problem    [name]  . Hyperlipidemia   . IBS (irritable bowel syndrome)   . Osteoarthritis   . Osteoporosis   . Seasonal allergies 01-08-12   hx. of multiple bronchitis related to this.  . Shoulder pain, right     Past Surgical History:  Procedure Laterality Date  . EXPLORATORY LAPAROTOMY  01-08-12   due to endometriosis-portions of both ovaries removed  . TONSILLECTOMY    . TOTAL HIP ARTHROPLASTY     right. 2002  . TOTAL HIP ARTHROPLASTY  01/14/2012   Procedure: TOTAL HIP ARTHROPLASTY;  Surgeon: Gearlean Alf, MD;  Location: [facility];  Service: Orthopedics;  Laterality: Left;  [name] VENTRAL HERNIA REPAIR  01-08-12   abdominal     Current Outpatient Medications  Medication Sig Dispense Refill  . amphetamine-dextroamphetamine (ADDERALL) 10 MG tablet Take 10 mg by mouth daily as needed (ADHD).     [name] aspirin 81 MG tablet Take 81 mg by mouth daily.    . Calcium Carb-Cholecalciferol (CALCIUM  600+D3 PO) Take 1,200 tablets by mouth daily.     . cholecalciferol (VITAMIN D3) 25 MCG (1000 UT) tablet Take 4,000 Units by mouth daily.    . folic acid (FOLVITE) 1 MG tablet Take 2 mg by mouth daily.     [name] gabapentin (NEURONTIN) 100 MG capsule Take 100 mg by mouth 3 (three) times daily as needed (nerve pain).    . meloxicam (MOBIC) 15 MG tablet Take 15 mg by mouth daily.    . rosuvastatin (CRESTOR) 5 MG tablet Take 1 tablet (5 mg total) by mouth 2 (two) times a week. 8 tablet 3  . traMADol (ULTRAM) 50 MG tablet Take 50 mg by mouth as needed (back).    . vitamin E 400 UNIT capsule Take 400 Units by mouth daily.    . nitroGLYCERIN (NITROSTAT) 0.4 MG SL tablet Place 1 tablet (0.4 mg total) under the tongue every 5 (five) minutes as needed for chest pain. 25 tablet 2   No current facility-administered medications for this visit.     Allergies:   Bactrim [sulfamethoxazole-trimethoprim]; Morphine; Morphine and related; Ceclor [cefaclor]; Clarithromycin; Codeine; Erythromycin; Levofloxacin; Loratadine; Nitrofurantoin; Propoxyphene; Propoxyphene n-acetaminophen; and Zithromax [azithromycin]    Social History:  The patient  reports that she has never smoked. She has never used smokeless tobacco. She reports current alcohol use. She reports that she does not use drugs.   Family History:  The patient's family history includes ADD / ADHD in her unknown relative; Coronary artery disease in her unknown relative; Heart attack in her mother; Heart disease in her mother; Prostate cancer in her father.    ROS:  Please see the history of present illness. Otherwise, review of systems are positive for none.  All other systems are reviewed and negative.    PHYSICAL EXAM: VS:  BP 118/78 (BP Location: Left Arm, Patient Position: Sitting, Cuff Size: Normal)   Pulse 79   Ht 5' (1.524 m)   Wt 120 lb 6.4 oz (54.6 kg)   LMP 01/08/1999   SpO2 97% Comment: at rest  BMI 23.51 kg/m  , BMI Body mass index is 23.51  kg/m.  General: Well developed, well nourished, NAD Skin: Warm, dry, intact  Head: Normocephalic, atraumatic, clear, moist mucus membranes. Neck: Negative for carotid bruits. No JVD Lungs:Clear to ausculation bilaterally. No wheezes, rales, or rhonchi. Breathing is unlabored. Cardiovascular: RRR with S1 S2. No murmurs, rubs, gallops, or LV heave appreciated. Abdomen: Soft, non-tender, non-distended with normoactive bowel sounds.No obvious abdominal masses. MSK: Strength and tone appear normal for age. 5/5 in all extremities Extremities: No edema. No clubbing or cyanosis. DP/PT pulses 2+ bilaterally Neuro: Alert and oriented. No focal deficits. No facial asymmetry. MAE spontaneously. Psych: Responds to questions appropriately with normal affect.  EKG:  EKG is not ordered today.  Recent Labs: 02/11/2018: TSH 1.58 06/14/2018: ALT 15; BUN 15; Creatinine, Ser 0.77; Hemoglobin 12.1; Platelets 258; Potassium 4.0; Sodium 126    Lipid Panel    Component Value Date/Time   CHOL 235 (H) 05/21/2018 1015   CHOL 259 (H) 06/13/2015 1700   TRIG 66 05/21/2018 1015   TRIG 100 06/13/2015 1700   HDL 114 05/21/2018 1015   HDL 103 06/13/2015 1700   CHOLHDL 2.1 05/21/2018 1015   CHOLHDL 1.6 12/11/2015 0825   VLDL 9 12/11/2015 0825   LDLCALC 108 (H) 05/21/2018 1015   LDLCALC 136 (H) 06/13/2015 1700   LDLDIRECT 120.7 08/04/2012 1607     Wt Readings from Last 3 Encounters:  06/14/18 120 lb 6.4 oz (54.6 kg)  06/13/18 120 lb (54.4 kg)  04/05/18 120 lb (54.4 kg)     Other studies Reviewed: Additional studies/ records that were reviewed today include: None    ASSESSMENT AND PLAN:  1. Recent c/o of chest pain with known strong family hx and elevated calcium score: -Pt was seen in the ED on 06/13/2018 for chest pain with associated dizziness.  -Known family hx of CAD and elevated calcium score per cardiac CT 06/13/2015, calcium score 209 which places her in the 81st percentile for age and sex  matched control -Last seen by Dr. Tamala Julian and plan was for primary risk reduction in the setting of positive family hx of premature atherosclerosis (sister and mother), severe elevation in lipids, and positive coronary calcium score -Will plan for further evaluation given the above with exercise stress test in the setting of recent unstable anginal symptoms, positive family history of premature atherosclerosis (sister and mother), severe elevation in lipids, and positive coronary calcium score -Will stop Cymbalta for the time being until stress test performed. -Continue ASA daily -Will add SL NTG PRN>>>indications and ED precautions reviewed.      2. Hyperlipidemia: -LDL, 165 on 02/11/2018 with improvement to 108 by 05/21/2018 -Plan was to refer to lipid clinic for possible initiation of PCSK9 given intolerance to statins on two separate occasions>>>per telephone encounter from 03/02/2018 she wanted to try another statin before being seen in the lipid clinic and was started on Crestor 72m  -Will likely need further therapy given marked elevation     Current medicines are reviewed at length with the patient today.  The patient does not have concerns regarding medicines.  The following changes have been made: Add SL NTG PRN   Labs/ tests ordered today include: CMET and exercise stress test to be performed    Orders Placed This Encounter  Procedures  . Comp Met (CMET)  . Myocardial Perfusion Imaging    Disposition:   FU with Dr. STamala Julianbased on test results   Signed, JKathyrn Drown NP  06/14/2018 2:47 PM    [facility] [address] [place]  [id number]: [phone] Fax: [phone]

## 2018-06-14 NOTE — ED Notes (Signed)
Patient verbalizes understanding of discharge instructions. Opportunity for questioning and answers were provided. Armband removed by staff, pt discharged from ED in wheelchair.  

## 2018-06-14 NOTE — Discharge Instructions (Addendum)
Follow-up with your cardiology this week as scheduled, and return to the ER if you develop worsening pain, difficulty breathing, high fever, or other new and concerning symptoms.

## 2018-06-14 NOTE — Patient Instructions (Addendum)
Medication Instructions:  Your physician has recommended you make the following change in your medication:  1. Discontinue Cymbalta 2. Sent in today  nitroglycerine take one tablet under tongue every 5 minutes for chest pain and take up to 3 in 15 minutes, if you have no relief call 99  Labwork: Your physician recommends that you have lab today: CMET   Testing/Procedures: Your physician has requested that you have a lexiscan myoview. For further information please visit [email or web address]. Please follow instruction sheet, as given.    Follow-Up: To be determined by test results.  Any Other Special Instructions Will Be Listed Below (If Applicable).  You are scheduled for a Myocardial Perfusion Imaging Study on Thursday, February 20 at 7:30 am.   Please arrive 15 minutes prior to your appointment time for registration and insurance purposes.   The test will take approximately 3 to 4 hours to complete; you may bring reading material. If someone comes with you to your appointment, they will need to remain in the main lobby due to limited space in the testing area.   If you are pregnant or breastfeeding, please notify the nuclear lab prior to your appointment.   How to prepare for your Myocardial Perfusion test:   Do not eat or drink 3 hours prior to your test, except you may have water.    Do not consume products containing caffeine (regular or decaffeinated) 12 hours prior to your test (ex: coffee, chocolate, soda, tea)   Do bring a list of your current medications with you. If not listed below, you may take your medications as normal.    Bring any held medication to your appointment, as you may be required to take it once the test is complete.   Do wear comfortable clothes (no dresses or overalls) and walking shoes. Tennis shoes are preferred. No heels or open toed shoes.  Do not wear cologne, perfume, aftershave or lotions (deodorant is allowed).   If these instructions are  not followed, you test will have to be rescheduled.   Please report to [address] for your test. If you have questions or concerns about your appointment, please call the Nuclear Lab at [phone].  If you cannot keep your appointment, please provide 24 hour notification to the Nuclear lab to avoid a possible $50 charge to your account.        If you need a refill on your cardiac medications before your next appointment, please call your pharmacy.

## 2018-06-14 NOTE — ED Notes (Signed)
Patient transported to X-ray 

## 2018-06-15 ENCOUNTER — Telehealth: Payer: Self-pay | Admitting: Internal Medicine

## 2018-06-15 ENCOUNTER — Telehealth (HOSPITAL_COMMUNITY): Payer: Self-pay

## 2018-06-15 DIAGNOSIS — M48062 Spinal stenosis, lumbar region with neurogenic claudication: Secondary | ICD-10-CM

## 2018-06-15 DIAGNOSIS — M160 Bilateral primary osteoarthritis of hip: Secondary | ICD-10-CM

## 2018-06-15 LAB — COMPREHENSIVE METABOLIC PANEL
ALT: 16 IU/L (ref 0–32)
AST: 22 IU/L (ref 0–40)
Albumin/Globulin Ratio: 2.2 (ref 1.2–2.2)
Albumin: 4.6 g/dL (ref 3.8–4.8)
Alkaline Phosphatase: 54 IU/L (ref 39–117)
BUN/Creatinine Ratio: 13 (ref 12–28)
BUN: 10 mg/dL (ref 8–27)
Bilirubin Total: 0.2 mg/dL (ref 0.0–1.2)
CO2: 24 mmol/L (ref 20–29)
Calcium: 9.3 mg/dL (ref 8.7–10.3)
Chloride: 94 mmol/L — ABNORMAL LOW (ref 96–106)
Creatinine, Ser: 0.77 mg/dL (ref 0.57–1.00)
GFR calc Af Amer: 91 mL/min/{1.73_m2} (ref >59)
GFR calc non Af Amer: 79 mL/min/{1.73_m2} (ref >59)
Globulin, Total: 2.1 g/dL (ref 1.5–4.5)
Glucose: 96 mg/dL (ref 65–99)
Potassium: 4.4 mmol/L (ref 3.5–5.2)
Sodium: 134 mmol/L (ref 134–144)
Total Protein: 6.7 g/dL (ref 6.0–8.5)

## 2018-06-15 NOTE — Telephone Encounter (Signed)
Copied from [place] [phone]. Topic: Referral - Request for Referral >> Jun 15, 2018  3:45 PM Yvette Rack wrote: Has patient seen PCP for this complaint? yes  *If NO, is insurance requiring patient see PCP for this issue before PCP can refer them? Referral for which specialty: physical therapy Preferred provider/office: [facility] Reason for referral: shoulder and spine

## 2018-06-15 NOTE — Telephone Encounter (Signed)
Detailed instructions left for the patient for her stress test. Pt asked to call back with any questions. S.Noam Karaffa EMTP

## 2018-06-16 DIAGNOSIS — Z791 Long term (current) use of non-steroidal anti-inflammatories (NSAID): Secondary | ICD-10-CM | POA: Diagnosis not present

## 2018-06-16 DIAGNOSIS — Z6823 Body mass index (BMI) 23.0-23.9, adult: Secondary | ICD-10-CM | POA: Diagnosis not present

## 2018-06-16 DIAGNOSIS — M150 Primary generalized (osteo)arthritis: Secondary | ICD-10-CM | POA: Diagnosis not present

## 2018-06-16 DIAGNOSIS — R42 Dizziness and giddiness: Secondary | ICD-10-CM | POA: Diagnosis not present

## 2018-06-16 DIAGNOSIS — M0579 Rheumatoid arthritis with rheumatoid factor of multiple sites without organ or systems involvement: Secondary | ICD-10-CM | POA: Diagnosis not present

## 2018-06-16 NOTE — Telephone Encounter (Signed)
Please advise 

## 2018-06-16 NOTE — Addendum Note (Signed)
Addended by: Cassandria Anger on: 06/16/2018 01:23 PM   Modules accepted: Orders

## 2018-06-16 NOTE — Telephone Encounter (Signed)
Ok Thx 

## 2018-06-17 ENCOUNTER — Ambulatory Visit (HOSPITAL_COMMUNITY): Payer: Medicare Other | Attending: Cardiovascular Disease

## 2018-06-17 DIAGNOSIS — I200 Unstable angina: Secondary | ICD-10-CM

## 2018-06-17 LAB — MYOCARDIAL PERFUSION IMAGING
Estimated workload: 11.7 METS
Exercise duration (min): 10 min
Exercise duration (sec): 0 s
LV dias vol: 31 mL (ref 46–106)
LV sys vol: 6 mL
MPHR: 151 {beats}/min
Peak HR: 150 {beats}/min
Percent HR: 99 %
Rest HR: 70 {beats}/min
SDS: 3
SRS: 0
SSS: 3
TID: 0.6

## 2018-06-17 MED ORDER — TECHNETIUM TC 99M TETROFOSMIN IV KIT
10.1000 | PACK | Freq: Once | INTRAVENOUS | Status: AC | PRN
  Administered 2018-06-17: 10.1 via INTRAVENOUS
  Filled 2018-06-17: qty 11

## 2018-06-17 MED ORDER — TECHNETIUM TC 99M TETROFOSMIN IV KIT
32.4000 | PACK | Freq: Once | INTRAVENOUS | Status: AC | PRN
  Administered 2018-06-17: 32.4 via INTRAVENOUS
  Filled 2018-06-17: qty 33

## 2018-06-22 ENCOUNTER — Telehealth: Payer: Self-pay | Admitting: Internal Medicine

## 2018-06-22 NOTE — Telephone Encounter (Signed)
Copied from [facility] [id number]. Topic: General - Other >> Jun 22, 2018  2:56 PM Percell Belt A wrote: Reason for CRM:   Pt called in and just wanted to let Dr Camila Li know that she went to the ER , thought it may have been a heart attack.  She was referred to cardiology.  She wanted to me to sent a message to just bring this to Dr Camila Li attention and would he look at her stress test and see if there is anything else that she needs to do?    Best number [phone]

## 2018-06-23 NOTE — Telephone Encounter (Signed)
I agree w/Cardiol ref Stress test was good Thx

## 2018-06-23 NOTE — Telephone Encounter (Signed)
LM notifying pt

## 2018-07-09 DIAGNOSIS — R030 Elevated blood-pressure reading, without diagnosis of hypertension: Secondary | ICD-10-CM | POA: Diagnosis not present

## 2018-07-09 DIAGNOSIS — M5416 Radiculopathy, lumbar region: Secondary | ICD-10-CM | POA: Diagnosis not present

## 2018-07-09 DIAGNOSIS — M48062 Spinal stenosis, lumbar region with neurogenic claudication: Secondary | ICD-10-CM | POA: Diagnosis not present

## 2018-08-18 ENCOUNTER — Telehealth: Payer: Self-pay | Admitting: Pharmacist

## 2018-08-18 NOTE — Telephone Encounter (Signed)
Spoke with patient to reschedule labs and in office visit. Advised to push out another 3-4 months, pt would prefer 2 months and reschedule if not safe at that time. Rescheduled lab and pharmD visit for 2 months out. She is doing well with current regimen, but eager to see if working to control cholesterol. She is aware to call with any concerns and that we will call if unable to safely have come in office in June.

## 2018-08-20 ENCOUNTER — Ambulatory Visit: Payer: Medicare Other

## 2018-08-20 ENCOUNTER — Other Ambulatory Visit: Payer: Medicare Other

## 2018-08-23 ENCOUNTER — Other Ambulatory Visit: Payer: Self-pay | Admitting: Interventional Cardiology

## 2018-08-23 MED ORDER — ROSUVASTATIN CALCIUM 5 MG PO TABS: 5.0000 mg | ORAL_TABLET | ORAL | 1 refills | Status: DC

## 2018-08-24 ENCOUNTER — Ambulatory Visit: Payer: Medicare Other

## 2018-10-06 ENCOUNTER — Ambulatory Visit: Payer: Medicare Other | Admitting: Internal Medicine

## 2018-10-06 DIAGNOSIS — Z20828 Contact with and (suspected) exposure to other viral communicable diseases: Secondary | ICD-10-CM | POA: Diagnosis not present

## 2018-10-15 ENCOUNTER — Telehealth: Payer: Self-pay | Admitting: *Deleted

## 2018-10-15 NOTE — Telephone Encounter (Signed)
    COVID-19 Pre-Screening Questions:  . In the past 7 to 10 days have you had a cough,  shortness of breath, headache, congestion, fever (100 or greater) body aches, chills, sore throat, or sudden loss of taste or sense of smell? . Have you been around anyone with known Covid 19. . Have you been around anyone who is awaiting Covid 19 test results in the past 7 to 10 days? . Have you been around anyone who has been exposed to Covid 19, or has mentioned symptoms of Covid 19 within the past 7 to 10 days?  If you have any concerns/questions about symptoms patients report during screening (either on the phone or at threshold). Contact the provider seeing the patient or DOD for further guidance.  If neither are available contact a member of the leadership team.           Contacted patient via telephone call. All no to Covid 19 questions . Has a mask.KB 

## 2018-10-18 ENCOUNTER — Other Ambulatory Visit: Payer: Self-pay

## 2018-10-18 ENCOUNTER — Other Ambulatory Visit: Payer: Medicare Other | Admitting: *Deleted

## 2018-10-18 ENCOUNTER — Encounter (INDEPENDENT_AMBULATORY_CARE_PROVIDER_SITE_OTHER): Payer: Self-pay

## 2018-10-18 DIAGNOSIS — E785 Hyperlipidemia, unspecified: Secondary | ICD-10-CM | POA: Diagnosis not present

## 2018-10-18 LAB — HEPATIC FUNCTION PANEL
ALT: 15 IU/L (ref 0–32)
AST: 21 IU/L (ref 0–40)
Albumin: 4.3 g/dL (ref 3.8–4.8)
Alkaline Phosphatase: 53 IU/L (ref 39–117)
Bilirubin Total: 0.4 mg/dL (ref 0.0–1.2)
Bilirubin, Direct: 0.11 mg/dL (ref 0.00–0.40)
Total Protein: 6.6 g/dL (ref 6.0–8.5)

## 2018-10-18 LAB — LIPID PANEL
Chol/HDL Ratio: 1.9 ratio (ref 0.0–4.4)
Cholesterol, Total: 208 mg/dL — ABNORMAL HIGH (ref 100–199)
HDL: 107 mg/dL (ref >39)
LDL Calculated: 93 mg/dL (ref 0–99)
Triglycerides: 39 mg/dL (ref 0–149)
VLDL Cholesterol Cal: 8 mg/dL (ref 5–40)

## 2018-10-19 ENCOUNTER — Telehealth (INDEPENDENT_AMBULATORY_CARE_PROVIDER_SITE_OTHER): Payer: Medicare Other | Admitting: Pharmacist

## 2018-10-19 DIAGNOSIS — E785 Hyperlipidemia, unspecified: Secondary | ICD-10-CM

## 2018-10-19 MED ORDER — ROSUVASTATIN CALCIUM 5 MG PO TABS: 5.0000 mg | ORAL_TABLET | ORAL | 3 refills | Status: DC

## 2018-10-19 NOTE — Progress Notes (Signed)
Patient ID: [name]                 DOB: 02-08-1951                    MRN: [id number]     HPI: Courtney [name] is a 68 y.o. female patient of Dr. Thompson Caul that presents today for lipid evaluation.  PMH includes family hx of CAD, asymptomatic CAD, and hyperlipidemia. She has a coronary calcium score of 200. Patient has been tried on a statins several times. At last visit she was started on rosuvastatin 5mg  two times a week.  Today's visit was done over the phone due to COVID-19 pandemic.   Patient states she is doing fine on rosuvastatin 5mg  two time a week. She states that her knees have been bothering her, but is unsure if she is having an RA flare, osteoarthitis or the statin.   She states that she eats a diet low in carbs. She also asked about a "low inflammatory diet." If this is something I could help her with. She is very interested in trying to control her disease states with her diet.   Risk Factors: Family hx of CAD, LDL >100, coronary calcium score, ASCVD risk: 8.9% LDL Goal: <70 mg/dL  Current Medications: aspirin 81 mg daily, rosuvastatin 5mg  twice a week Intolerances: rosuvastatin (20 mg, 5 mg daily; myalgia), atorvastatin 10mg  daily (myalgias)  Diet: most meals are from home. She does eat meat, fish 1-2x/month, red meat, pork, and chicken. Does not eat fried foods. Uses olive oil when cooking, adds butter if she needs to increase the smoke point of her cooking medium. Uses smart balance in place of butter for toast. Patient is gluten free.  Exercise: Unable to exercise d/t sciatica and lumbar spine issues   Family History: CAD (unknown relative), MI (mother), heart disease (mother), prostate cancer (father)  Labs: Lipid panel  (10/18/18) : TC 208, HDL 107, LDL 93 (rosuvastatin 5mg  twice a week) Lipid panel (05/21/18) : TC 235, HLD 114, LDL 108 (atorvastatin 5mg  3-4x week) Lipid panel (02/12/18): TC 287, HDL 102, LDL 165 (no therapy)  Coronary Calcium  score (06/13/15): 54 (81st percentile for age and sex matched control)  Past Medical History:  Diagnosis Date  . ADD (attention deficit disorder)   . Anxiety   . Celiac disease    possible vs IBS  . Depression    no bipolar per Dr. [name]  . GI problem    [name]  . Hyperlipidemia   . IBS (irritable bowel syndrome)   . Osteoarthritis   . Osteoporosis   . Seasonal allergies 01-08-12   hx. of multiple bronchitis related to this.  . Shoulder pain, right     Current Outpatient Medications on File Prior to Visit  Medication Sig Dispense Refill  . amphetamine-dextroamphetamine (ADDERALL) 10 MG tablet Take 10 mg by mouth daily as needed (ADHD).     [name] aspirin 81 MG tablet Take 81 mg by mouth daily.    . Calcium Carb-Cholecalciferol (CALCIUM 600+D3 PO) Take 1,200 tablets by mouth daily.     . cholecalciferol (VITAMIN D3) 25 MCG (1000 UT) tablet Take 4,000 Units by mouth daily.    . folic acid (FOLVITE) 1 MG tablet Take 2 mg by mouth daily.     [name] gabapentin (NEURONTIN) 100 MG capsule Take 100 mg by mouth 3 (three) times daily as needed (nerve pain).    . meloxicam (MOBIC) 15 MG tablet Take  15 mg by mouth daily.    . nitroGLYCERIN (NITROSTAT) 0.4 MG SL tablet Place 1 tablet (0.4 mg total) under the tongue every 5 (five) minutes as needed for chest pain. 25 tablet 2  . rosuvastatin (CRESTOR) 5 MG tablet Take 1 tablet (5 mg total) by mouth 2 (two) times a week. 8 tablet 1  . traMADol (ULTRAM) 50 MG tablet Take 50 mg by mouth as needed (back).    . vitamin E 400 UNIT capsule Take 400 Units by mouth daily.     No current facility-administered medications on file prior to visit.     Allergies  Allergen Reactions  . Bactrim [Sulfamethoxazole-Trimethoprim] Nausea And Vomiting  . Morphine Nausea And Vomiting  . Morphine And Related Nausea And Vomiting  . Ceclor [Cefaclor] Nausea And Vomiting    Can take Augmentin ok  . Clarithromycin Nausea Only    REACTION: nausea  . Codeine Nausea And  Vomiting  . Erythromycin Nausea And Vomiting  . Levofloxacin Nausea Only    REACTION: nausea  . Loratadine     REACTION: bruises  . Nitrofurantoin Nausea Only  . Propoxyphene Nausea Only    dizzy  . Propoxyphene N-Acetaminophen Nausea Only    dizzy  . Zithromax [Azithromycin] Nausea And Vomiting    Assessment/Plan: Hyperlipidemia: Based on her most recent lipid panel, LDL is not at goal <70. Results of last lipid panel were reviewed in detail with patient. We did discuss the importance of diet on her cardiovascular risk and the importance of lowering lipids, but did remind patient that there are several factors affecting atherosclerosis such as blood pressure, insulin resistance and exercise. I did encourage her to continue a diet that is lower in carbs. Advised that she many want to see a dietician about a "low inflammatory diet" as would not be able to help with this.  Discussed the importance of statins on stabilizing plaque, reducing/preventing plaque build up, reducing inflammation and oxidation. These are the main reasons we want patient to remain on maximally tolerated statin therapy. Discussed additional treatment options. 1. Increase rosuvastatin to 3 or 4 times a week 2. Add zetia 3. Add PCSK9 inhibitor. Reviewed side effects of each medication with patient, LDL lowering capability and cardiovascular outcomes data. Patient is concerned about the increase in knee pain she has been having lately and is concerned about changing medication. She would like to wait to see if her knee pain gets worse or resolves. She would like to follow up in 3 months which would give her time to see her rheumatologist or orthopedic doctor if needed. I think this is a very reasonable plan as I am convinced her knee pain is much more likely associated with RA or OA (patient is not on any DMARD) than it is statin associated. Appointment made for 3 months to follow up Patient requested some information on the  additional medications we discussed. I will mail.   Thank you,   Ramond Dial, Pharm.D, [facility]  [address], [place], [place] [address]  Phone: [phone]; Fax: [phone]

## 2018-10-21 ENCOUNTER — Ambulatory Visit: Payer: Medicare Other

## 2018-11-04 DIAGNOSIS — F900 Attention-deficit hyperactivity disorder, predominantly inattentive type: Secondary | ICD-10-CM | POA: Diagnosis not present

## 2018-11-04 DIAGNOSIS — F3342 Major depressive disorder, recurrent, in full remission: Secondary | ICD-10-CM | POA: Diagnosis not present

## 2018-11-05 DIAGNOSIS — Z20828 Contact with and (suspected) exposure to other viral communicable diseases: Secondary | ICD-10-CM | POA: Diagnosis not present

## 2018-11-23 DIAGNOSIS — N959 Unspecified menopausal and perimenopausal disorder: Secondary | ICD-10-CM | POA: Diagnosis not present

## 2018-11-23 DIAGNOSIS — N958 Other specified menopausal and perimenopausal disorders: Secondary | ICD-10-CM | POA: Diagnosis not present

## 2018-11-23 DIAGNOSIS — E871 Hypo-osmolality and hyponatremia: Secondary | ICD-10-CM | POA: Diagnosis not present

## 2018-11-23 DIAGNOSIS — R5383 Other fatigue: Secondary | ICD-10-CM | POA: Diagnosis not present

## 2018-11-23 DIAGNOSIS — M8588 Other specified disorders of bone density and structure, other site: Secondary | ICD-10-CM | POA: Diagnosis not present

## 2018-11-23 DIAGNOSIS — Z124 Encounter for screening for malignant neoplasm of cervix: Secondary | ICD-10-CM | POA: Diagnosis not present

## 2018-11-23 DIAGNOSIS — R42 Dizziness and giddiness: Secondary | ICD-10-CM | POA: Diagnosis not present

## 2018-11-23 DIAGNOSIS — Z1231 Encounter for screening mammogram for malignant neoplasm of breast: Secondary | ICD-10-CM | POA: Diagnosis not present

## 2018-11-23 DIAGNOSIS — D649 Anemia, unspecified: Secondary | ICD-10-CM | POA: Diagnosis not present

## 2018-11-23 DIAGNOSIS — R6882 Decreased libido: Secondary | ICD-10-CM | POA: Diagnosis not present

## 2018-12-02 ENCOUNTER — Ambulatory Visit: Payer: Medicare Other | Admitting: Internal Medicine

## 2018-12-03 ENCOUNTER — Other Ambulatory Visit: Payer: Self-pay

## 2018-12-03 DIAGNOSIS — Z20822 Contact with and (suspected) exposure to covid-19: Secondary | ICD-10-CM

## 2018-12-04 LAB — NOVEL CORONAVIRUS, NAA: SARS-CoV-2, NAA: NOT DETECTED

## 2018-12-09 DIAGNOSIS — M160 Bilateral primary osteoarthritis of hip: Secondary | ICD-10-CM | POA: Diagnosis not present

## 2018-12-09 DIAGNOSIS — M48062 Spinal stenosis, lumbar region with neurogenic claudication: Secondary | ICD-10-CM | POA: Diagnosis not present

## 2018-12-28 DIAGNOSIS — Z791 Long term (current) use of non-steroidal anti-inflammatories (NSAID): Secondary | ICD-10-CM | POA: Diagnosis not present

## 2018-12-28 DIAGNOSIS — M150 Primary generalized (osteo)arthritis: Secondary | ICD-10-CM | POA: Diagnosis not present

## 2018-12-28 DIAGNOSIS — M0579 Rheumatoid arthritis with rheumatoid factor of multiple sites without organ or systems involvement: Secondary | ICD-10-CM | POA: Diagnosis not present

## 2018-12-28 DIAGNOSIS — R42 Dizziness and giddiness: Secondary | ICD-10-CM | POA: Diagnosis not present

## 2018-12-28 DIAGNOSIS — L603 Nail dystrophy: Secondary | ICD-10-CM | POA: Diagnosis not present

## 2018-12-28 DIAGNOSIS — Z6822 Body mass index (BMI) 22.0-22.9, adult: Secondary | ICD-10-CM | POA: Diagnosis not present

## 2018-12-30 DIAGNOSIS — M48062 Spinal stenosis, lumbar region with neurogenic claudication: Secondary | ICD-10-CM | POA: Diagnosis not present

## 2018-12-30 DIAGNOSIS — M160 Bilateral primary osteoarthritis of hip: Secondary | ICD-10-CM | POA: Diagnosis not present

## 2019-01-10 NOTE — Patient Instructions (Addendum)
It was a pleasure seeing you in clinic today Courtney [name]!  Today the plan is...  Please call the PharmD clinic at [phone] if you have any questions that you would like to speak with a pharmacist about [facility], [place], [place]).

## 2019-01-10 NOTE — Progress Notes (Signed)
Patient ID: [name]                 DOB: 07-06-1950                      MRN: [id number]     HPI: Courtney [name] is a 68 y.o. female patient of Dr. Thompson [name] that presents today for lipid evaluation.  PMH includes family hx of CAD, asymptomatic CAD, and hyperlipidemia. Her most recent coronary calcium score was 209 (81st percentile for age and sex matched control), which was calculated on 06/13/2015. Patient has been tried on a statins several times. At last visit, pt was complaining of knee pain and was unsure if knee pain was related to RA flare, OA, or statin therapy. Rosuvastatin dose was continued at 5mg  two times a week until pt was able to see provider managing arthritis.   Pt presents today for f/u lipid clinic appt with PharmD.  Risk Factors: Family hx of CAD, LDL >100, coronary calcium score, ASCVD risk: 8.9% LDL Goal: <70 mg/dL  Current Medications: aspirin 81 mg daily, rosuvastatin 5mg  twice a week Intolerances: rosuvastatin (20 mg, 5 mg daily; myalgia), atorvastatin 10mg  daily (myalgias)  Diet: most meals are from home. She does eat meat, fish 1-2x/month, red meat, pork, and chicken. Does not eat fried foods. Uses olive oil when cooking, adds butter if she needs to increase the smoke point of her cooking medium. Uses smart balance in place of butter for toast. Patient is gluten free.  Exercise: Unable to exercise d/t sciatica and lumbar spine issues   Family History: CAD (unknown relative), MI (mother), heart disease (mother), prostate cancer (father)  Labs: Lipid panel  (10/18/18) : TC 208, HDL 107, LDL 93 (rosuvastatin 5mg  twice a week) Lipid panel (05/21/18) : TC 235, HLD 114, LDL 108 (atorvastatin 5mg  3-4x week) Lipid panel (02/12/18): TC 287, HDL 102, LDL 165 (no therapy)   Wt Readings from Last 3 Encounters:  06/17/18 120 lb (54.4 kg)  06/14/18 120 lb 6.4 oz (54.6 kg)  06/13/18 120 lb (54.4 kg)   BP Readings from Last 3 Encounters:  06/14/18  118/78  06/14/18 (!) 98/49  04/05/18 122/68   Pulse Readings from Last 3 Encounters:  06/14/18 79  06/14/18 67  04/05/18 79    Renal function: CrCl cannot be calculated (Patient's most recent lab result is older than the maximum 21 days allowed.).  Past Medical History:  Diagnosis Date   ADD (attention deficit disorder)    Anxiety    Celiac disease    possible vs IBS   Depression    no bipolar per Dr. [name]   GI problem    Olevia Perches   Hyperlipidemia    IBS (irritable bowel syndrome)    Osteoarthritis    Osteoporosis    Seasonal allergies 01-08-12   hx. of multiple bronchitis related to this.   Shoulder pain, right     Current Outpatient Medications on File Prior to Visit  Medication Sig Dispense Refill   amphetamine-dextroamphetamine (ADDERALL) 10 MG tablet Take 10 mg by mouth daily as needed (ADHD).      aspirin 81 MG tablet Take 81 mg by mouth daily.     Calcium Carb-Cholecalciferol (CALCIUM 600+D3 PO) Take 1,200 tablets by mouth daily.      cholecalciferol (VITAMIN D3) 25 MCG (1000 UT) tablet Take 4,000 Units by mouth daily.     folic acid (FOLVITE) 1 MG tablet Take 2 mg by mouth  daily.      gabapentin (NEURONTIN) 100 MG capsule Take 100 mg by mouth 3 (three) times daily as needed (nerve pain).     meloxicam (MOBIC) 15 MG tablet Take 15 mg by mouth daily.     nitroGLYCERIN (NITROSTAT) 0.4 MG SL tablet Place 1 tablet (0.4 mg total) under the tongue every 5 (five) minutes as needed for chest pain. 25 tablet 2   rosuvastatin (CRESTOR) 5 MG tablet Take 1 tablet (5 mg total) by mouth 2 (two) times a week. 12 tablet 3   traMADol (ULTRAM) 50 MG tablet Take 50 mg by mouth as needed (back).     vitamin E 400 UNIT capsule Take 400 Units by mouth daily.     No current facility-administered medications on file prior to visit.     Allergies  Allergen Reactions   Bactrim [Sulfamethoxazole-Trimethoprim] Nausea And Vomiting   Morphine Nausea And Vomiting    Morphine And Related Nausea And Vomiting   Ceclor [Cefaclor] Nausea And Vomiting    Can take Augmentin ok   Clarithromycin Nausea Only    REACTION: nausea   Codeine Nausea And Vomiting   Erythromycin Nausea And Vomiting   Levofloxacin Nausea Only    REACTION: nausea   Loratadine     REACTION: bruises   Nitrofurantoin Nausea Only   Propoxyphene Nausea Only    dizzy   Propoxyphene N-Acetaminophen Nausea Only    dizzy   Zithromax [Azithromycin] Nausea And Vomiting     Assessment/Plan:  1. Hyperlipidemia - 1. Increase rosuvastatin to 3 or 4 times a week 2. Add zetia 3. Add PCSK9 inhibitor (Repatha is Tier 4 on her plan; $65 for 30 day supply).  Thank you for involving pharmacy to assist in providing [name]'s care.   Drexel Iha, PharmD PGY2 Ambulatory Care Pharmacy Resident

## 2019-01-11 ENCOUNTER — Encounter: Payer: Self-pay | Admitting: Pharmacist

## 2019-01-11 ENCOUNTER — Telehealth (INDEPENDENT_AMBULATORY_CARE_PROVIDER_SITE_OTHER): Payer: Self-pay | Admitting: Pharmacist

## 2019-01-11 DIAGNOSIS — E785 Hyperlipidemia, unspecified: Secondary | ICD-10-CM

## 2019-01-11 NOTE — Progress Notes (Signed)
HPI: Courtney [name] is a 68 y.o. female patient of Dr. Thompson Caul that presents today for lipid evaluation. PMH includes family hx of CAD, asymptomatic CAD, and hyperlipidemia. Her most recent coronary calcium score was 209 (81st percentile for age and sex matched control), which was calculated on 06/13/2015. Patient has been tried on a statins several times. At last visit (09/2018), pt was complaining of knee pain and was unsure if knee pain was related to RA flare, OA, or statin therapy. Rosuvastatin dose was continued at 5mg  two times a week until pt was able to see provider managing arthritis.   Pt presents today for f/u lipid clinic appt via telemedicine with PharmD. Pt states she has seen her rheumatologist since last appt and had labs drawn to determine if her knee pain was correlated to her rheumatoid arthritis. She has not received results of labs yet. Pt states knee pain has improved. Pt voices concern about statin therapy, specifically, she states being on a statin if she pulls a muscle it will be significantly more painful than when she was not on statin therapy.  Risk Factors: Family hx of CAD, LDL >100, coronary calcium score, ASCVD risk: 8.9% LDL Goal: <70 mg/dL  Current Medications: aspirin 81 mg daily,rosuvastatin 5mg  twice a week Intolerances: rosuvastatin (20 mg, 5 mgdaily; myalgia), atorvastatin 10mg  daily (myalgias)  Diet: most meals are from home. She does eat meat, fish 1-2x/month, red meat, pork, and chicken. Does not eat fried foods. Uses olive oil when cooking, adds butter if she needs to increase the smoke point of her cooking medium. Uses smart balance in place of butter for toast. Patient is gluten free.  Exercise: Unable to exercise d/t sciatica and lumbar spine issues   Family History: CAD (unknown relative), MI (mother), heart disease (mother), prostate cancer (father)  Labs: Lipid panel(10/18/18) : TC 208, HDL 107, LDL 93 (rosuvastatin 5mg  twice a  week) Lipid panel (05/21/18) : TC 235, HLD 114, LDL 108 (atorvastatin 5mg  3-4x week) Lipid panel (02/12/18): TC 287, HDL 102, LDL 165 (no therapy)      Wt Readings from Last 3 Encounters:  06/17/18 120 lb (54.4 kg)  06/14/18 120 lb 6.4 oz (54.6 kg)  06/13/18 120 lb (54.4 kg)      BP Readings from Last 3 Encounters:  06/14/18 118/78  06/14/18 (!) 98/49  04/05/18 122/68      Pulse Readings from Last 3 Encounters:  06/14/18 79  06/14/18 67  04/05/18 79    Renal function: CrCl cannot be calculated (Patient's most recent lab result is older than the maximum 21 days allowed.).      Past Medical History:  Diagnosis Date  . ADD (attention deficit disorder)   . Anxiety   . Celiac disease    possible vs IBS  . Depression    no bipolar per Dr. [name]  . GI problem    [name]  . Hyperlipidemia   . IBS (irritable bowel syndrome)   . Osteoarthritis   . Osteoporosis   . Seasonal allergies 01-08-12   hx. of multiple bronchitis related to this.  . Shoulder pain, right           Current Outpatient Medications on File Prior to Visit  Medication Sig Dispense Refill  . amphetamine-dextroamphetamine (ADDERALL) 10 MG tablet Take 10 mg by mouth daily as needed (ADHD).     [name] aspirin 81 MG tablet Take 81 mg by mouth daily.    . Calcium Carb-Cholecalciferol (CALCIUM 600+D3 PO) Take  1,200 tablets by mouth daily.     . cholecalciferol (VITAMIN D3) 25 MCG (1000 UT) tablet Take 4,000 Units by mouth daily.    . folic acid (FOLVITE) 1 MG tablet Take 2 mg by mouth daily.     [name] gabapentin (NEURONTIN) 100 MG capsule Take 100 mg by mouth 3 (three) times daily as needed (nerve pain).    . meloxicam (MOBIC) 15 MG tablet Take 15 mg by mouth daily.    . nitroGLYCERIN (NITROSTAT) 0.4 MG SL tablet Place 1 tablet (0.4 mg total) under the tongue every 5 (five) minutes as needed for chest pain. 25 tablet 2  . rosuvastatin (CRESTOR) 5 MG tablet Take 1 tablet (5 mg total) by  mouth 2 (two) times a week. 12 tablet 3  . traMADol (ULTRAM) 50 MG tablet Take 50 mg by mouth as needed (back).    . vitamin E 400 UNIT capsule Take 400 Units by mouth daily.     No current facility-administered medications on file prior to visit.          Allergies  Allergen Reactions  . Bactrim [Sulfamethoxazole-Trimethoprim] Nausea And Vomiting  . Morphine Nausea And Vomiting  . Morphine And Related Nausea And Vomiting  . Ceclor [Cefaclor] Nausea And Vomiting    Can take Augmentin ok  . Clarithromycin Nausea Only    REACTION: nausea  . Codeine Nausea And Vomiting  . Erythromycin Nausea And Vomiting  . Levofloxacin Nausea Only    REACTION: nausea  . Loratadine     REACTION: bruises  . Nitrofurantoin Nausea Only  . Propoxyphene Nausea Only    dizzy  . Propoxyphene N-Acetaminophen Nausea Only    dizzy  . Zithromax [Azithromycin] Nausea And Vomiting     Assessment/Plan:  1. Hyperlipidemia - LDL goal <70 mg/dL; therefore, pt is not at goal. Pt is not interested in increasing her statin dose. Pt is interested in receiving education about ASCVD risk as well as other HLD pharmacotherapy options. Plan to discuss Zetia or Repatha at future appt. Ezetimibe is Tier 1 on her insurance plan ($10/30day supply) and Repatha is Tier 4 on her plan ($65/30 day supply). Follow up with patient on 01/18/19 at 11:30AM per patient preference.  Thank you for involving pharmacy to assist in providing Courtney [name]'s care.             Drexel Iha, PharmD PGY2 Ambulatory Care Pharmacy Resident

## 2019-01-11 NOTE — Telephone Encounter (Signed)
This encounter was created in error - please disregard.

## 2019-01-18 ENCOUNTER — Telehealth: Payer: Self-pay | Admitting: Pharmacist

## 2019-01-18 ENCOUNTER — Telehealth: Payer: Medicare Other

## 2019-01-18 DIAGNOSIS — E785 Hyperlipidemia, unspecified: Secondary | ICD-10-CM

## 2019-01-18 NOTE — Telephone Encounter (Signed)
Called pt at 12:44 PM on 01/18/19 and left HIPAA-compliant VM with instructions to call [place] [facility] and speak to PharmD regarding medications.   Specifically, calling to discuss how pt's LDL 93 (10/18/18) on rosuvastatin 5 mg twice a week which is greater than goal of <70 mg/dL. She was not interested in increasing statin dose. Plan to discuss Zetia or Repatha to bring pt to LDL goal. Ezetimibe is Tier 1 on her insurance plan ($10/30day supply) and Repatha is Tier 4 on her plan ($65/30 day supply).  Will f/u with pt again next week 01/25/19 if she does not call back.

## 2019-01-19 NOTE — Addendum Note (Signed)
Addended by: Marcelle Overlie D on: 01/19/2019 02:50 PM   Modules accepted: Orders

## 2019-01-19 NOTE — Telephone Encounter (Signed)
Spoke with patient about her cholesterol panel and options as described below by Drexel Iha. Discussed the cardiovascular risk reductions and times to benefit (~6 months to 1 year for Repatha vs 2-3 years for Zetia). Discussed side effects of Repatha including muscle aches, runny nose, flu like reaction and injection site reaction.  Patient is currently taking Rosuvastatin 5mg  twice a week. She states that she is getting cramps in her muscles like charlie horses.  We discussed decreasing to once a week to see if this makes improvement. Patient would like to decrease to once a week and repeat her lipid panel in 2 months. If her LDL is higher she said she will convince herself to start [place]. Labs scheduled for Nov 19. Will call patient when resulted to discuss further.

## 2019-01-21 ENCOUNTER — Ambulatory Visit: Payer: Medicare Other

## 2019-02-02 DIAGNOSIS — F900 Attention-deficit hyperactivity disorder, predominantly inattentive type: Secondary | ICD-10-CM | POA: Diagnosis not present

## 2019-02-04 DIAGNOSIS — E782 Mixed hyperlipidemia: Secondary | ICD-10-CM | POA: Insufficient documentation

## 2019-02-04 NOTE — Progress Notes (Signed)
This encounter was created in error - please disregard.

## 2019-02-07 DIAGNOSIS — M160 Bilateral primary osteoarthritis of hip: Secondary | ICD-10-CM | POA: Diagnosis not present

## 2019-02-07 DIAGNOSIS — M48062 Spinal stenosis, lumbar region with neurogenic claudication: Secondary | ICD-10-CM | POA: Diagnosis not present

## 2019-02-10 DIAGNOSIS — M160 Bilateral primary osteoarthritis of hip: Secondary | ICD-10-CM | POA: Diagnosis not present

## 2019-02-10 DIAGNOSIS — M48062 Spinal stenosis, lumbar region with neurogenic claudication: Secondary | ICD-10-CM | POA: Diagnosis not present

## 2019-03-03 DIAGNOSIS — R6882 Decreased libido: Secondary | ICD-10-CM | POA: Diagnosis not present

## 2019-03-15 ENCOUNTER — Telehealth: Payer: Self-pay

## 2019-03-15 DIAGNOSIS — M160 Bilateral primary osteoarthritis of hip: Secondary | ICD-10-CM | POA: Diagnosis not present

## 2019-03-15 DIAGNOSIS — E538 Deficiency of other specified B group vitamins: Secondary | ICD-10-CM

## 2019-03-15 DIAGNOSIS — I2510 Atherosclerotic heart disease of native coronary artery without angina pectoris: Secondary | ICD-10-CM

## 2019-03-15 DIAGNOSIS — E785 Hyperlipidemia, unspecified: Secondary | ICD-10-CM

## 2019-03-15 DIAGNOSIS — M48062 Spinal stenosis, lumbar region with neurogenic claudication: Secondary | ICD-10-CM | POA: Diagnosis not present

## 2019-03-15 NOTE — Telephone Encounter (Signed)
Copied from [place] [id number]. Topic: Appointment Scheduling - Scheduling Inquiry for Clinic >> Mar 14, 2019 11:20 AM Alanda Slim E wrote: Reason for CRM: Pt wants to come in and have blood work done/ and then have her 6 month f/u rescheduled for after the blood work to have something to go over/ Pt would like a call from the nurse to go over what she would like blood work for [employer] advise >> Mar 14, 2019 11:30 AM Richardo Priest, NT wrote: Pt called in again and would also like PCP to coordinate with her other doctors to see what other labs may be needed. Please advise.

## 2019-03-16 ENCOUNTER — Telehealth: Payer: Self-pay

## 2019-03-16 ENCOUNTER — Ambulatory Visit: Payer: Medicare Other | Admitting: Internal Medicine

## 2019-03-16 NOTE — Addendum Note (Signed)
Addended by: Cassandria Anger on: 03/16/2019 07:00 AM   Modules accepted: Orders

## 2019-03-16 NOTE — Telephone Encounter (Signed)
Returned a call to pt got a msg that she wanted to r/s lmomed the pt and will await callback

## 2019-03-16 NOTE — Telephone Encounter (Signed)
LMTCB

## 2019-03-16 NOTE — Telephone Encounter (Signed)
Labs are ordered. Thanks

## 2019-03-17 ENCOUNTER — Other Ambulatory Visit: Payer: Medicare Other

## 2019-03-17 NOTE — Telephone Encounter (Signed)
Pt would like to know if we can order a folate test to make sure she is not taking to much folic acid.   Please advise

## 2019-03-18 NOTE — Telephone Encounter (Signed)
ok 

## 2019-03-18 NOTE — Addendum Note (Signed)
Addended by: Cassandria Anger on: 03/18/2019 09:36 AM   Modules accepted: Orders

## 2019-03-28 ENCOUNTER — Other Ambulatory Visit: Payer: Medicare Other

## 2019-03-30 ENCOUNTER — Telehealth: Payer: Self-pay | Admitting: Pharmacist

## 2019-03-30 NOTE — Telephone Encounter (Signed)
Called pt on 03/30/2019 at 2:26PM and left HIPAA-compliant VM with instructions to call [facility] clinic back   Plan to discuss toleration of rosuvastatin 5 mg once weekly and to schedule updated lipid panel to discuss further HLD management. Plan to f/u again on 04/01/2019.  Drexel Iha, PharmD PGY2 Ambulatory Care Pharmacy Resident

## 2019-04-05 ENCOUNTER — Other Ambulatory Visit: Payer: Medicare Other

## 2019-04-05 ENCOUNTER — Other Ambulatory Visit: Payer: Self-pay

## 2019-04-06 DIAGNOSIS — F900 Attention-deficit hyperactivity disorder, predominantly inattentive type: Secondary | ICD-10-CM | POA: Diagnosis not present

## 2019-04-06 DIAGNOSIS — F3342 Major depressive disorder, recurrent, in full remission: Secondary | ICD-10-CM | POA: Diagnosis not present

## 2019-04-06 DIAGNOSIS — F411 Generalized anxiety disorder: Secondary | ICD-10-CM | POA: Diagnosis not present

## 2019-04-11 ENCOUNTER — Other Ambulatory Visit: Payer: Self-pay | Admitting: Interventional Cardiology

## 2019-04-11 ENCOUNTER — Other Ambulatory Visit: Payer: Self-pay

## 2019-04-11 ENCOUNTER — Other Ambulatory Visit: Payer: Medicare Other | Admitting: *Deleted

## 2019-04-11 DIAGNOSIS — E785 Hyperlipidemia, unspecified: Secondary | ICD-10-CM | POA: Diagnosis not present

## 2019-04-12 ENCOUNTER — Telehealth: Payer: Self-pay | Admitting: Pharmacist

## 2019-04-12 LAB — LIPID PANEL
Chol/HDL Ratio: 2 ratio (ref 0.0–4.4)
Cholesterol, Total: 237 mg/dL — ABNORMAL HIGH (ref 100–199)
HDL: 120 mg/dL (ref >39)
LDL Chol Calc (NIH): 110 mg/dL — ABNORMAL HIGH (ref 0–99)
Triglycerides: 41 mg/dL (ref 0–149)
VLDL Cholesterol Cal: 7 mg/dL (ref 5–40)

## 2019-04-12 NOTE — Telephone Encounter (Signed)
Called pt on 04/12/2019 at 3:18PM and left HIPAA-compliant VM with instructions to call [facility] clinic back   Plan to discuss toleration of rosuvastatin 5 mg once weekly and possible initiation of PCSK9 inhibitor considering elevated LDL level on recent lipid panel.   Drexel Iha, PharmD PGY2 Ambulatory Care Pharmacy Resident

## 2019-04-18 NOTE — Telephone Encounter (Addendum)
Spoke with pt regarding lipids. She is tolerating rosuvastatin 5mg  twice weekly. She is frustrated her LDL has increased since June when she was taking the same medication (LDL was 93 then, is 110 now). She doesn't report any big changes in diet or exercise.  Spent 20 minutes on the phone discussing other lipid lowering medications including Zetia and Repatha. Ultimately she decided she wishes to try Repatha as she states she has GI issues at baseline and is fearful of GI side effects associated with Zetia. Will submit prior authorization and follow up with pt once approved. She has Pharmacist, community and qualifies for $5 copay card. She wishes to give her first injection in clinic.

## 2019-04-20 ENCOUNTER — Other Ambulatory Visit: Payer: Self-pay

## 2019-04-20 MED ORDER — ROSUVASTATIN CALCIUM 5 MG PO TABS: 5.0000 mg | ORAL_TABLET | ORAL | 1 refills | Status: DC

## 2019-04-26 MED ORDER — REPATHA SURECLICK 140 MG/ML ~~LOC~~ SOAJ: 1.0000 "pen " | SUBCUTANEOUS | 11 refills | Status: DC

## 2019-04-26 NOTE — Telephone Encounter (Signed)
Repatha prior auth approved through 04/20/20. Rx sent to pharmacy. Unable to activate $5 copay card for pt - website flagged her since age > 68 even though she has Pharmacist, community. She will need to call Repatha Ready at [phone] to activate $5 copay card. Copay is $60/month without it.  Pt is aware to call Repatha Ready to activate copay card. She will call clinic once she receives her first supply of Repatha - she wishes to give her first injection in the office.

## 2019-04-26 NOTE — Addendum Note (Signed)
Addended by: Marybella Ethier E on: 04/26/2019 09:12 AM   Modules accepted: Orders

## 2019-06-06 DIAGNOSIS — R6883 Chills (without fever): Secondary | ICD-10-CM | POA: Diagnosis not present

## 2019-06-14 ENCOUNTER — Ambulatory Visit (INDEPENDENT_AMBULATORY_CARE_PROVIDER_SITE_OTHER): Payer: Medicare Other | Admitting: Internal Medicine

## 2019-06-14 ENCOUNTER — Other Ambulatory Visit: Payer: Self-pay

## 2019-06-14 ENCOUNTER — Encounter: Payer: Self-pay | Admitting: Internal Medicine

## 2019-06-14 DIAGNOSIS — M79604 Pain in right leg: Secondary | ICD-10-CM

## 2019-06-14 DIAGNOSIS — M79605 Pain in left leg: Secondary | ICD-10-CM

## 2019-06-14 DIAGNOSIS — M5386 Other specified dorsopathies, lumbar region: Secondary | ICD-10-CM | POA: Diagnosis not present

## 2019-06-14 DIAGNOSIS — L97501 Non-pressure chronic ulcer of other part of unspecified foot limited to breakdown of skin: Secondary | ICD-10-CM

## 2019-06-14 DIAGNOSIS — M79606 Pain in leg, unspecified: Secondary | ICD-10-CM | POA: Insufficient documentation

## 2019-06-14 DIAGNOSIS — E785 Hyperlipidemia, unspecified: Secondary | ICD-10-CM | POA: Diagnosis not present

## 2019-06-14 DIAGNOSIS — F324 Major depressive disorder, single episode, in partial remission: Secondary | ICD-10-CM | POA: Diagnosis not present

## 2019-06-14 DIAGNOSIS — K582 Mixed irritable bowel syndrome: Secondary | ICD-10-CM | POA: Diagnosis not present

## 2019-06-14 DIAGNOSIS — I2510 Atherosclerotic heart disease of native coronary artery without angina pectoris: Secondary | ICD-10-CM | POA: Diagnosis not present

## 2019-06-14 DIAGNOSIS — L97509 Non-pressure chronic ulcer of other part of unspecified foot with unspecified severity: Secondary | ICD-10-CM | POA: Insufficient documentation

## 2019-06-14 MED ORDER — TRAMADOL HCL 50 MG PO TABS: 50.0000 mg | ORAL_TABLET | Freq: Four times a day (QID) | ORAL | 0 refills | Status: DC | PRN

## 2019-06-14 MED ORDER — TRIAMCINOLONE ACETONIDE 0.5 % EX OINT: 1.0000 "application " | TOPICAL_OINTMENT | Freq: Two times a day (BID) | CUTANEOUS | 3 refills | Status: DC

## 2019-06-14 NOTE — Assessment & Plan Note (Signed)
On Lipitor 

## 2019-06-14 NOTE — Assessment & Plan Note (Signed)
Chronic. 

## 2019-06-14 NOTE — Assessment & Plan Note (Signed)
On gluten free diet We can do MRT (Mediator release test) for allergies

## 2019-06-14 NOTE — Patient Instructions (Addendum)
SebaMed soap   MTHFR We can do MRT (Mediator release test) for allergies

## 2019-06-14 NOTE — Assessment & Plan Note (Signed)
R>L Art doppler US

## 2019-06-14 NOTE — Assessment & Plan Note (Signed)
Multiple toes - ?etiology Doppler US - arterial ?neuropathy vs other ?RArelated

## 2019-06-14 NOTE — Assessment & Plan Note (Addendum)
F/u w/Dr Tamala Julian Lipitor po 2/wk due to pains. CoQ10, Vit D Pt is thinking about Repatha

## 2019-06-14 NOTE — Progress Notes (Signed)
Subjective:  Patient ID: Courtney [name], female    DOB: 07-17-1950  Age: 69 y.o. MRN: [id number]  CC: No chief complaint on file.   HPI Courtney [name] presents for a spinal stenosis, R radiculopathy C/o not able to walk distance any longer The pt asked me to do a test called MTHFR Tramadol helped in the past  C/o dry skin - hands  Past labs: Vit D 35 Vit B12 >1000  C/o RA - hands, feet   C/o shallow ulcers on toes   Outpatient Medications Prior to Visit  Medication Sig Dispense Refill  . aspirin 81 MG tablet Take 81 mg by mouth daily.    . Calcium Carb-Cholecalciferol (CALCIUM 600+D3 PO) Take 1,200 tablets by mouth daily.     . cholecalciferol (VITAMIN D3) 25 MCG (1000 UT) tablet Take 4,000 Units by mouth daily.    [name] gabapentin (NEURONTIN) 100 MG capsule Take 100 mg by mouth 3 (three) times daily as needed (nerve pain).    . meloxicam (MOBIC) 15 MG tablet Take 15 mg by mouth daily.    . traMADol (ULTRAM) 50 MG tablet Take 50 mg by mouth as needed (back).    . vitamin E 400 UNIT capsule Take 400 Units by mouth daily.    [name] VYVANSE 50 MG capsule Take 50 mg by mouth daily.    . nitroGLYCERIN (NITROSTAT) 0.4 MG SL tablet Place 1 tablet (0.4 mg total) under the tongue every 5 (five) minutes as needed for chest pain. 25 tablet 2  . amphetamine-dextroamphetamine (ADDERALL) 10 MG tablet Take 10 mg by mouth daily as needed (ADHD).     . Evolocumab (REPATHA SURECLICK) [id number] MG/ML SOAJ Inject 1 pen into the skin every 14 (fourteen) days. 2 pen 11  . folic acid (FOLVITE) 1 MG tablet Take 2 mg by mouth daily.     . rosuvastatin (CRESTOR) 5 MG tablet Take 1 tablet (5 mg total) by mouth 2 (two) times a week. 12 tablet 1   No facility-administered medications prior to visit.    ROS: Review of Systems  Constitutional: Negative for activity change, appetite change, chills, fatigue and unexpected weight change.  HENT: Negative for congestion, mouth sores and sinus pressure.     Eyes: Negative for visual disturbance.  Respiratory: Negative for cough and chest tightness.   Gastrointestinal: Negative for abdominal pain and nausea.  Genitourinary: Negative for difficulty urinating, frequency and vaginal pain.  Musculoskeletal: Positive for arthralgias, back pain and gait problem.  Skin: Positive for rash and wound. Negative for pallor.  Neurological: Negative for dizziness, tremors, weakness, numbness and headaches.  Psychiatric/Behavioral: Negative for confusion, sleep disturbance and suicidal ideas.    Objective:  BP 126/72 (BP Location: Left Arm, Patient Position: Sitting, Cuff Size: Normal)   Pulse (!) 112   Temp 98.3 F (36.8 C) (Oral)   Ht 4' 11.5" (1.511 m)   Wt 118 lb (53.5 kg)   LMP 01/08/1999   BMI 23.43 kg/m   BP Readings from Last 3 Encounters:  06/14/19 126/72  06/14/18 118/78  06/14/18 (!) 98/49    Wt Readings from Last 3 Encounters:  06/14/19 118 lb (53.5 kg)  06/17/18 120 lb (54.4 kg)  06/14/18 120 lb 6.4 oz (54.6 kg)    Physical Exam Constitutional:      General: She is not in acute distress.    Appearance: She is well-developed.  HENT:     Head: Normocephalic.     Right Ear: External ear normal.  Left Ear: External ear normal.     Nose: Nose normal.  Eyes:     General:        Right eye: No discharge.        Left eye: No discharge.     Conjunctiva/sclera: Conjunctivae normal.     Pupils: Pupils are equal, round, and reactive to light.  Neck:     Thyroid: No thyromegaly.     Vascular: No JVD.     Trachea: No tracheal deviation.  Cardiovascular:     Rate and Rhythm: Normal rate and regular rhythm.     Heart sounds: Normal heart sounds.  Pulmonary:     Effort: No respiratory distress.     Breath sounds: No stridor. No wheezing.  Abdominal:     General: Bowel sounds are normal. There is no distension.     Palpations: Abdomen is soft. There is no mass.     Tenderness: There is no abdominal tenderness. There is no  guarding or rebound.  Musculoskeletal:        General: Tenderness present.     Cervical back: Normal range of motion and neck supple.  Lymphadenopathy:     Cervical: No cervical adenopathy.  Skin:    Findings: Lesion present. No erythema or rash.  Neurological:     Cranial Nerves: No cranial nerve deficit.     Motor: No abnormal muscle tone.     Coordination: Coordination normal.     Gait: Gait abnormal.     Deep Tendon Reflexes: Reflexes normal.  Psychiatric:        Behavior: Behavior normal.        Thought Content: Thought content normal.        Judgment: Judgment normal.    Very flexible B troch major tender  LS tender A few distal  toes w/lesions - ?4-6 mm shallow ulcers   Lab Results  Component Value Date   WBC 5.0 06/14/2018   HGB 12.1 06/14/2018   HCT 35.6 (L) 06/14/2018   PLT 258 06/14/2018   GLUCOSE 96 06/14/2018   CHOL 237 (H) 04/11/2019   TRIG 41 04/11/2019   HDL 120 04/11/2019   LDLDIRECT 120.7 08/04/2012   LDLCALC 110 (H) 04/11/2019   ALT 15 10/18/2018   AST 21 10/18/2018   NA 134 06/14/2018   K 4.4 06/14/2018   CL 94 (L) 06/14/2018   CREATININE 0.77 06/14/2018   BUN 10 06/14/2018   CO2 24 06/14/2018   TSH 1.58 02/11/2018   INR 0.96 01/08/2012   HGBA1C 5.4 02/11/2018    DG Chest 2 View  Result Date: 06/14/2018 CLINICAL DATA:  Mid chest pain EXAM: CHEST - 2 VIEW COMPARISON:  06/03/2000 CXR report, chest CT 06/13/2015 FINDINGS: Normal heart size. Tortuous atherosclerotic aorta. Clear lungs. Moderate subchondral degenerative cystic change and sclerosis about the glenohumeral joints bilaterally with inferomedial spurring off both humeral heads. IMPRESSION: No active cardiopulmonary disease. Electronically Signed   By: Ashley Royalty M.D.   On: 06/14/2018 00:34    Assessment & Plan:   Walker Kehr, MD

## 2019-06-15 ENCOUNTER — Ambulatory Visit (INDEPENDENT_AMBULATORY_CARE_PROVIDER_SITE_OTHER): Payer: Medicare Other | Admitting: Family Medicine

## 2019-06-15 ENCOUNTER — Encounter: Payer: Self-pay | Admitting: Family Medicine

## 2019-06-15 VITALS — BP 110/72 | HR 84 | Ht 59.0 in | Wt 119.0 lb

## 2019-06-15 DIAGNOSIS — M542 Cervicalgia: Secondary | ICD-10-CM | POA: Diagnosis not present

## 2019-06-15 DIAGNOSIS — I2510 Atherosclerotic heart disease of native coronary artery without angina pectoris: Secondary | ICD-10-CM

## 2019-06-15 DIAGNOSIS — M4316 Spondylolisthesis, lumbar region: Secondary | ICD-10-CM | POA: Diagnosis not present

## 2019-06-15 DIAGNOSIS — M5030 Other cervical disc degeneration, unspecified cervical region: Secondary | ICD-10-CM | POA: Diagnosis not present

## 2019-06-15 DIAGNOSIS — M2170 Unequal limb length (acquired), unspecified site: Secondary | ICD-10-CM

## 2019-06-15 DIAGNOSIS — M48062 Spinal stenosis, lumbar region with neurogenic claudication: Secondary | ICD-10-CM | POA: Diagnosis not present

## 2019-06-15 MED ORDER — VITAMIN D (ERGOCALCIFEROL) 1.25 MG (50000 UNIT) PO CAPS: 50000.0000 [IU] | ORAL_CAPSULE | ORAL | 0 refills | Status: DC

## 2019-06-15 MED ORDER — DULOXETINE HCL 20 MG PO CPEP: 20.0000 mg | ORAL_CAPSULE | Freq: Every day | ORAL | 0 refills | Status: DC

## 2019-06-15 NOTE — Progress Notes (Signed)
[place] Phone: [phone] Subjective:   Fontaine No, am serving as a scribe for Dr. Hulan Saas. This visit occurred during the SARS-CoV-2 public health emergency.  Safety protocols were in place, including screening questions prior to the visit, additional usage of staff PPE, and extensive cleaning of exam room while observing appropriate contact time as indicated for disinfecting solutions.   I'm seeing this patient by the request  of:  Plotnikov, Evie Lacks, MD  CC: Low back pain and neck pain  [id number]  Amberly A Andres-Gregg is a 69 y.o. female coming in with complaint of back pain. Patient states that she has been having chronic back pain. History of bilateral hip replacements. Was told that she needs fusion in lumbar spine. Does have right leg numbness 90% of the time.   Patient also states that her cervical spine has also been bothering her for months. Denies any radiating symptoms from her neck.    CT Myelogram 06/18/2016 IMPRESSION: LUMBAR MYELOGRAM IMPRESSION:  1. 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 Correlation with radiographs is recommended prior to any operative intervention. 2. Multilevel lumbar grade 1 spondylolisthesis. Suggestion of instability at L2-L3 on flexion/extension; grade 1 anterolisthesis at that level which appears exacerbated in flexion and partially reduced in extension. 3. No other abnormal motion in flexion/extension. 4. Levoconvex lumbar scoliosis. Advanced lumbar endplate degeneration.  CT LUMBAR MYELOGRAM IMPRESSION:  1. Transitional lumbosacral anatomy as stated above. 2. Advanced disc degeneration at L2-L3 with vacuum disc. Right subarticular broad-based component contributing to moderate right lateral recess stenosis an mild spinal stenosis.  Superimposed multifactorial moderate to severe right neural foraminal stenosis at L3-L4. Query right L3 radiculitis. 3. No other significant lumbar spinal stenosis, but there is moderate or severe multifactorial neural foraminal stenosis also at the right L4 nerve level at L4-L5.  Patient brought in a notebook with at least 7 different CDs of MRIs.  Most of these are in electronic medical record and had been reviewed previously.  Reviewed patient's chart for greater than 35 minutes this morning.  Patient notebook had multiple different things from outside other providers who have discussed things such as surgical intervention secondary to her back but no promises that would make difference.  Patient has been to formal physical therapy multiple times.    Past Medical History:  Diagnosis Date  . ADD (attention deficit disorder)   . Anxiety   . Celiac disease    possible vs IBS  . Depression    no bipolar per Dr. [name]  . GI problem    [name]  . Hyperlipidemia   . IBS (irritable bowel syndrome)   . Osteoarthritis   . Osteoporosis   . Seasonal allergies 01-08-12   hx. of multiple bronchitis related to this.  . Shoulder pain, right    Past Surgical History:  Procedure Laterality Date  . EXPLORATORY LAPAROTOMY  01-08-12   due to endometriosis-portions of both ovaries removed  . TONSILLECTOMY    . TOTAL HIP ARTHROPLASTY     right. 2002  . TOTAL HIP ARTHROPLASTY  01/14/2012   Procedure: TOTAL HIP ARTHROPLASTY;  Surgeon: Gearlean Alf, MD;  Location: [facility];  Service: Orthopedics;  Laterality: Left;  [name] VENTRAL HERNIA REPAIR  01-08-12   abdominal   Social History   Socioeconomic History  . Marital status: Married    Spouse name:  Not on file  . Number of children: Not on file  . Years of education: Not on file  . Highest education level: Not on file  Occupational History  . Occupation: Futures trader  Tobacco Use  . Smoking status: Never Smoker  . Smokeless tobacco:  Never Used  . Tobacco comment: only social  Substance and Sexual Activity  . Alcohol use: Yes    Alcohol/week: 0.0 standard drinks    Comment: rare social   . Drug use: No  . Sexual activity: Yes  Other Topics Concern  . Not on file  Social History Narrative  . Not on file   Social Determinants of Health   Financial Resource Strain:   . Difficulty of Paying Living Expenses: Not on file  Food Insecurity:   . Worried About Charity fundraiser in the Last Year: Not on file  . Ran Out of Food in the Last Year: Not on file  Transportation Needs:   . Lack of Transportation (Medical): Not on file  . Lack of Transportation (Non-Medical): Not on file  Physical Activity:   . Days of Exercise per Week: Not on file  . Minutes of Exercise per Session: Not on file  Stress:   . Feeling of Stress : Not on file  Social Connections:   . Frequency of Communication with Friends and Family: Not on file  . Frequency of Social Gatherings with Friends and Family: Not on file  . Attends Religious Services: Not on file  . Active Member of Clubs or Organizations: Not on file  . Attends Archivist Meetings: Not on file  . Marital Status: Not on file   Allergies  Allergen Reactions  . Bactrim [Sulfamethoxazole-Trimethoprim] Nausea And Vomiting  . Morphine Nausea And Vomiting  . Morphine And Related Nausea And Vomiting  . Ceclor [Cefaclor] Nausea And Vomiting    Can take Augmentin ok  . Clarithromycin Nausea Only    REACTION: nausea  . Codeine Nausea And Vomiting  . Erythromycin Nausea And Vomiting  . Levofloxacin Nausea Only    REACTION: nausea  . Loratadine     REACTION: bruises  . Nitrofurantoin Nausea Only  . Propoxyphene Nausea Only    dizzy  . Propoxyphene N-Acetaminophen Nausea Only    dizzy  . Zithromax [Azithromycin] Nausea And Vomiting   Family History  Problem Relation Age of Onset  . Heart disease Mother   . Heart attack Mother   . Prostate cancer Father   .  Coronary artery disease Unknown        FH Female 1st degree relative <60  . ADD / ADHD Unknown      Current Outpatient Medications (Cardiovascular):  .  nitroGLYCERIN (NITROSTAT) 0.4 MG SL tablet, Place 1 tablet (0.4 mg total) under the tongue every 5 (five) minutes as needed for chest pain.   Current Outpatient Medications (Analgesics):  .  aspirin 81 MG tablet, Take 81 mg by mouth daily. .  meloxicam (MOBIC) 15 MG tablet, Take 15 mg by mouth daily. .  traMADol (ULTRAM) 50 MG tablet, Take 1 tablet (50 mg total) by mouth every 6 (six) hours as needed for severe pain (back).   Current Outpatient Medications (Other):  [name]  Calcium Carb-Cholecalciferol (CALCIUM 600+D3 PO), Take 1,200 tablets by mouth daily.  .  cholecalciferol (VITAMIN D3) 25 MCG (1000 UT) tablet, Take 4,000 Units by mouth daily. [name]  gabapentin (NEURONTIN) 100 MG capsule, Take 100 mg by mouth 3 (three) times daily as  needed (nerve pain). .  triamcinolone ointment (KENALOG) 0.5 %, Apply 1 application topically 2 (two) times daily. .  vitamin E 400 UNIT capsule, Take 400 Units by mouth daily. [name]  VYVANSE 50 MG capsule, Take 50 mg by mouth daily. .  DULoxetine (CYMBALTA) 20 MG capsule, Take 1 capsule (20 mg total) by mouth daily. .  Vitamin D, Ergocalciferol, (DRISDOL) 1.25 MG (50000 UNIT) CAPS capsule, Take 1 capsule (50,000 Units total) by mouth every 7 (seven) days.   Reviewed prior external information including notes and imaging from  primary care provider As well as notes that were available from care everywhere and other healthcare systems.  Past medical history, social, surgical and family history all reviewed in electronic medical record.  No pertanent information unless stated regarding to the chief complaint.   Review of Systems:  No headache, visual changes, nausea, vomiting, diarrhea, constipation, dizziness, abdominal pain, skin rash, fevers, chills, night sweats, weight loss, swollen lymph nodes, , chest pain,  shortness of breath, mood changes. POSITIVE muscle aches, body aches, joint swelling  Objective  Blood pressure 110/72, pulse 84, height 4\' 11"  (1.499 m), weight 119 lb (54 kg), last menstrual period 01/08/1999, SpO2 99 %.   General: No apparent distress alert and oriented x3 mood and affect normal, dressed appropriately.  Patient is though quite disagreeable interrupting significant number of times.  Very standoffish from the very beginning stating things of not even knowing why she is here. HEENT: Pupils equal, extraocular movements intact  Respiratory: Patient's speak in full sentences and does not appear short of breath  Cardiovascular: No lower extremity edema, non tender, no erythema  Skin: Warm dry intact with no signs of infection or rash on extremities or on axial skeleton.  Abdomen: Soft nontender  Neuro: Cranial nerves II through XII are intact, neurovascularly intact in all extremities with 2+ DTRs and 2+ pulses.  Lymph: No lymphadenopathy of posterior or anterior cervical chain or axillae bilaterally.  Gait mild antalgic MSK: Neck exam does have some loss of lordosis, only has near full range of motion but unfortunately lacks sidebending of 10 degrees bilaterally.  5 out of 5 strength of the upper extremities with patient moving them considerably during the office visit.  Good grip strength and good dexterity with patient going through her notebook  Low back exam does show some degenerative scoliosis noted that is quite severe.  Patient has forward flexion of 30 degrees and extension of 5 degrees.  Patient has some mild atrophy of the lower extremities bilaterally of the thigh musculature.  Neurovascularly intact distally.  Tightness of the hamstrings noted mild positive Corky Sox right greater than left.  Leg length discrepancy noted with the right shorter than left approximately eighth of an inch   [id number]; 30 additional minutes spent for Therapeutic exercises as stated in above notes.   This included exercises focusing on stretching, strengthening, with significant focus on eccentric aspects.   Long term goals include an improvement in range of motion, strength, endurance as well as avoiding reinjury. Patient's frequency would include in 1-2 times a day, 3-5 times a week for a duration of 6-12 weeks. Low back exercises that included:  Pelvic tilt/bracing instruction to focus on control of the pelvic girdle and lower abdominal muscles  Glute strengthening exercises, focusing on proper firing of the glutes without engaging the low back muscles Proper stretching techniques for maximum relief for the hamstrings, hip flexors, low back and some rotation where tolerated   Proper technique shown  and discussed handout in great detail with ATC.  All questions were discussed and answered.     Impression and Recommendations:     This case required medical decision making of moderate complexity. The above documentation has been reviewed and is accurate and complete Lyndal Pulley, DO       Note: This dictation was prepared with Dragon dictation along with smaller phrase technology. Any transcriptional errors that result from this process are unintentional.

## 2019-06-15 NOTE — Assessment & Plan Note (Signed)
Associated with patient's degenerative scoliosis and likely hip replacements.  Discussed adding a orthotic into the right shoe only.  Do not want to fix the entire amount.

## 2019-06-15 NOTE — Assessment & Plan Note (Addendum)
Patient is quite standoffish and pessimistic.  Patient was fairly rude even to the staff here today.  Patient most recent imaging does show that patient does have a right sided L4 nerve impingement that likely contributes to this.  We discussed with patient icing regimen, exercises, discussed the possibility of formal physical therapy where they could potentially do some dry needling.  We discussed the possibility of aquatic therapy.  Patient wants to try to do more with natural approach.  Started once weekly vitamin D but also did start on Cymbalta patient is to increase certain activities that I think will be beneficial.  Discussed the gabapentin and different type of treatments.  Discussed possible need for laboratory work-up.  Follow-up again in 4 to 8 weeks history of osteopenia made some concern for underlying osteochondral manipulation did discuss this with patient.  We discussed with patient that there is going to be some amount of pain at all times but hopefully we will make some mild improvement at the end of the exam patient also work with athletic trainer in great amount of time.  Given home exercises until patient can get into aquatic therapy

## 2019-06-15 NOTE — Assessment & Plan Note (Signed)
Patient was in our office for greater than 2 hours in total.  We discussed with her I do feel formal physical therapy will be beneficial.  I do not believe that further advanced imaging would warrant different changes in medical management at the moment.  We discussed different medications and patient will try the Cymbalta and see if we make improvement as well as do the physical therapy first follow-up again in 6 weeks

## 2019-06-15 NOTE — Patient Instructions (Addendum)
Good to see you.  Ice 20 minutes 2 times daily. Usually after activity and before bed. Exercises 3 times a week.  pennsaid pinkie amount topically 2 times daily as needed.  cymbalta 20 mg daily  Once weekly vitamin D for 12 weeks.  Tart cherry extract 1200mg  at night Rocker bottom shoes like finn comfort, HOKA, Xelero could all be good.  [place], [employer] and stern or beauty rest black mattresses, look ta furniture land [employer] pillow on Sultana would be great  I do not believe manipulation is an option for you   See me again in 6 weeks

## 2019-06-21 DIAGNOSIS — M542 Cervicalgia: Secondary | ICD-10-CM | POA: Diagnosis not present

## 2019-06-21 DIAGNOSIS — M79604 Pain in right leg: Secondary | ICD-10-CM | POA: Diagnosis not present

## 2019-06-21 DIAGNOSIS — M79605 Pain in left leg: Secondary | ICD-10-CM | POA: Diagnosis not present

## 2019-06-21 DIAGNOSIS — M25511 Pain in right shoulder: Secondary | ICD-10-CM | POA: Diagnosis not present

## 2019-06-21 DIAGNOSIS — M545 Low back pain: Secondary | ICD-10-CM | POA: Diagnosis not present

## 2019-06-21 NOTE — Progress Notes (Addendum)
Cardiology Office Note:    Date:  06/22/2019   ID:  Courtney [name], DOB [birth year], MRN [id number]  PCP:  Cassandria Anger, MD  Cardiologist:  Sinclair Grooms, MD   Referring MD: Cassandria Anger, MD   Chief Complaint  Patient presents with  . Coronary Artery Disease    Asymptomatic    History of Present Illness:    Courtney [name] is a 69 y.o. female with a hx strong family history CAD, asymptomatic CAD,and hyperlipidemia.  She has no symptoms.  She has great concern about therapy for hyperlipidemia.  Statins caused unacceptable muscle pain and cramping.  She is concerned about taking injectable therapy because of the impact it may have on her body.  We discussed her data including calcium score 4 years ago greater than 200, LDL cholesterol levels consistently above 100, chronic inflammatory disease impact on cardiovascular risk, and the increased risk of clotting associated with nonsteroidal anti-inflammatory therapy (chronic).  Hence our recommendation has been to lower the LDL to give added protection despite her significantly elevated HDL.  Past Medical History:  Diagnosis Date  . ADD (attention deficit disorder)   . Anxiety   . Celiac disease    possible vs IBS  . Depression    no bipolar per Dr. [name]  . GI problem    [name]  . Hyperlipidemia   . IBS (irritable bowel syndrome)   . Osteoarthritis   . Osteoporosis   . Seasonal allergies 01-08-12   hx. of multiple bronchitis related to this.  . Shoulder pain, right     Past Surgical History:  Procedure Laterality Date  . EXPLORATORY LAPAROTOMY  01-08-12   due to endometriosis-portions of both ovaries removed  . TONSILLECTOMY    . TOTAL HIP ARTHROPLASTY     right. 2002  . TOTAL HIP ARTHROPLASTY  01/14/2012   Procedure: TOTAL HIP ARTHROPLASTY;  Surgeon: Gearlean Alf, MD;  Location: [facility];  Service: Orthopedics;  Laterality: Left;  [name] VENTRAL HERNIA REPAIR  01-08-12   abdominal     Current Medications: Current Meds  Medication Sig  . aspirin 81 MG tablet Take 81 mg by mouth daily.  . Calcium Carb-Cholecalciferol (CALCIUM 600+D3 PO) Take 1,200 tablets by mouth daily.   . cholecalciferol (VITAMIN D3) 25 MCG (1000 UT) tablet Take 4,000 Units by mouth daily.  . DULoxetine (CYMBALTA) 20 MG capsule Take 1 capsule (20 mg total) by mouth daily.  [name] gabapentin (NEURONTIN) 100 MG capsule Take 100 mg by mouth 3 (three) times daily as needed (nerve pain).  . meloxicam (MOBIC) 15 MG tablet Take 15 mg by mouth daily.  . nitroGLYCERIN (NITROSTAT) 0.4 MG SL tablet Place 1 tablet (0.4 mg total) under the tongue every 5 (five) minutes as needed for chest pain.  . traMADol (ULTRAM) 50 MG tablet Take 1 tablet (50 mg total) by mouth every 6 (six) hours as needed for severe pain (back).  . triamcinolone ointment (KENALOG) 0.5 % Apply 1 application topically 2 (two) times daily.  . Vitamin D, Ergocalciferol, (DRISDOL) 1.25 MG (50000 UNIT) CAPS capsule Take 1 capsule (50,000 Units total) by mouth every 7 (seven) days.  . vitamin E 400 UNIT capsule Take 800 Units by mouth daily.   [name] VYVANSE 50 MG capsule Take 50 mg by mouth daily.     Allergies:   Bactrim [sulfamethoxazole-trimethoprim], Morphine, Morphine and related, Ceclor [cefaclor], Clarithromycin, Codeine, Erythromycin, Levofloxacin, Loratadine, Nitrofurantoin, Propoxyphene, Propoxyphene n-acetaminophen, and Zithromax [azithromycin]   Social  History   Socioeconomic History  . Marital status: Married    Spouse name: Not on file  . Number of children: Not on file  . Years of education: Not on file  . Highest education level: Not on file  Occupational History  . Occupation: Futures trader  Tobacco Use  . Smoking status: Never Smoker  . Smokeless tobacco: Never Used  . Tobacco comment: only social  Substance and Sexual Activity  . Alcohol use: Yes    Alcohol/week: 0.0 standard drinks    Comment: rare social   . Drug use: No   . Sexual activity: Yes  Other Topics Concern  . Not on file  Social History Narrative  . Not on file   Social Determinants of Health   Financial Resource Strain:   . Difficulty of Paying Living Expenses: Not on file  Food Insecurity:   . Worried About Charity fundraiser in the Last Year: Not on file  . Ran Out of Food in the Last Year: Not on file  Transportation Needs:   . Lack of Transportation (Medical): Not on file  . Lack of Transportation (Non-Medical): Not on file  Physical Activity:   . Days of Exercise per Week: Not on file  . Minutes of Exercise per Session: Not on file  Stress:   . Feeling of Stress : Not on file  Social Connections:   . Frequency of Communication with Friends and Family: Not on file  . Frequency of Social Gatherings with Friends and Family: Not on file  . Attends Religious Services: Not on file  . Active Member of Clubs or Organizations: Not on file  . Attends Archivist Meetings: Not on file  . Marital Status: Not on file     Family History: The patient's family history includes ADD / ADHD in her unknown relative; Coronary artery disease in her unknown relative; Heart attack in her mother; Heart disease in her mother; Prostate cancer in her father.  ROS:   Please see the history of present illness.    Musculoskeletal aches and pains.  All other systems reviewed and are negative.  EKGs/Labs/Other Studies Reviewed:    The following studies were reviewed today:  Myocardial perfusion imaging February 2020: Study Highlights    Nuclear stress EF: 80%.  Blood pressure demonstrated a normal response to exercise.  There was no ST segment deviation noted during stress.  The study is normal.  This is a low risk study.  The left ventricular ejection fraction is hyperdynamic (>65%).   Normal resting and stress perfusion. No ischemia or infarction EF 80%     Coronary calcium score February 2017: IMPRESSION: Coronary calcium  score of 209. This was 69 percentile for age and sex matched control.  EKG:  EKG normal sinus rhythm, vertical axis, poor R wave progression V1 through V4.  There is no change when compared to June 14, 2018.  Recent Labs: 10/18/2018: ALT 15  Recent Lipid Panel    Component Value Date/Time   CHOL 237 (H) 04/11/2019 1316   CHOL 259 (H) 06/13/2015 1700   TRIG 41 04/11/2019 1316   TRIG 100 06/13/2015 1700   HDL 120 04/11/2019 1316   HDL 103 06/13/2015 1700   CHOLHDL 2.0 04/11/2019 1316   CHOLHDL 1.6 12/11/2015 0825   VLDL 9 12/11/2015 0825   LDLCALC 110 (H) 04/11/2019 1316   LDLCALC 136 (H) 06/13/2015 1700   LDLDIRECT 120.7 08/04/2012 1607    Physical Exam:  VS:  BP 124/72   Pulse 85   Ht 4\' 11"  (1.499 m)   Wt 118 lb 9.6 oz (53.8 kg)   LMP 01/08/1999   SpO2 100%   BMI 23.95 kg/m     Wt Readings from Last 3 Encounters:  06/22/19 118 lb 9.6 oz (53.8 kg)  06/15/19 119 lb (54 kg)  06/14/19 118 lb (53.5 kg)     GEN: Slender and age-appropriate. No acute distress HEENT: Normal NECK: No JVD. LYMPHATICS: No lymphadenopathy CARDIAC:  RRR without murmur, gallop, or edema. VASCULAR:  Normal Pulses. No bruits. RESPIRATORY:  Clear to auscultation without rales, wheezing or rhonchi  ABDOMEN: Soft, non-tender, non-distended, No pulsatile mass, MUSCULOSKELETAL: No deformity  SKIN: Warm and dry NEUROLOGIC:  Alert and oriented x 3 PSYCHIATRIC:  Normal affect   ASSESSMENT:    1. CAD in native artery   2. Dyslipidemia   3. Bilateral carotid bruits   4. TOBACCO USE, QUIT   5. Educated about COVID-19 virus infection    PLAN:    In order of problems listed above:  1. Her prevention with lipid lowering to 70 or less given concomitant inflammatory disease and nonsteroidal anti-inflammatory therapy. 2. Most recent LDL on low-dose statin taken twice per week was 110 mg/dL.  HDL was 120.  Total cholesterol 240.  I recommend that she use PCSK9 therapy. 3. No symptoms to  suggest TIA. 4. Not currently a smoker. 5. Has reached see both doses of her vaccine.  Overall education and awareness concerning primary/secondary risk prevention was discussed in detail: LDL less than 70, hemoglobin A1c less than 7, blood pressure target less than 130/80 mmHg, >150 minutes of moderate aerobic activity per week, avoidance of smoking, weight control (via diet and exercise), and continued surveillance/management of/for obstructive sleep apnea.   Extended office visit due to counseling with greater than 50% of time spent in that aspect of care.  I do think she understands our recommendation now based upon the multitude of issues as noted above.  I will plan to see her back in 1 year.  We will continue to culture and hopefully influence decisions concerning primary prevention.  Medication Adjustments/Labs and Tests Ordered: Current medicines are reviewed at length with the patient today.  Concerns regarding medicines are outlined above.  Orders Placed This Encounter  Procedures  . EKG 12-Lead   No orders of the defined types were placed in this encounter.   Patient Instructions  Medication Instructions:  Your physician recommends that you continue on your current medications as directed. Please refer to the Current Medication list given to you today.  *If you need a refill on your cardiac medications before your next appointment, please call your pharmacy*  Lab Work: None If you have labs (blood work) drawn today and your tests are completely normal, you will receive your results only by: [name] MyChart Message (if you have MyChart) OR . A paper copy in the mail If you have any lab test that is abnormal or we need to change your treatment, we will call you to review the results.  Testing/Procedures: None  Follow-Up: At [facility], you and your health needs are our priority.  As part of our continuing mission to provide you with exceptional heart care, we have created  designated Provider Care Teams.  These Care Teams include your primary Cardiologist (physician) and Advanced Practice Providers (APPs -  Physician Assistants and Nurse Practitioners) who all work together to provide you with the care you  need, when you need it.  Your next appointment:   12 month(s)  The format for your next appointment:   In Person  Provider:   You may see Sinclair Grooms, MD or one of the following Advanced Practice Providers on your designated Care Team:    Truitt Merle, NP  Cecilie Kicks, NP  [name], NP   Other Instructions      Signed, Sinclair Grooms, MD  06/22/2019 9:23 AM    [place]

## 2019-06-22 ENCOUNTER — Ambulatory Visit (INDEPENDENT_AMBULATORY_CARE_PROVIDER_SITE_OTHER): Payer: Medicare Other | Admitting: Interventional Cardiology

## 2019-06-22 ENCOUNTER — Other Ambulatory Visit: Payer: Self-pay

## 2019-06-22 ENCOUNTER — Telehealth: Payer: Self-pay | Admitting: Pharmacist

## 2019-06-22 ENCOUNTER — Encounter: Payer: Self-pay | Admitting: Interventional Cardiology

## 2019-06-22 VITALS — BP 124/72 | HR 85 | Ht 59.0 in | Wt 118.6 lb

## 2019-06-22 DIAGNOSIS — Z7189 Other specified counseling: Secondary | ICD-10-CM | POA: Diagnosis not present

## 2019-06-22 DIAGNOSIS — E785 Hyperlipidemia, unspecified: Secondary | ICD-10-CM | POA: Diagnosis not present

## 2019-06-22 DIAGNOSIS — Z87891 Personal history of nicotine dependence: Secondary | ICD-10-CM

## 2019-06-22 DIAGNOSIS — I2510 Atherosclerotic heart disease of native coronary artery without angina pectoris: Secondary | ICD-10-CM | POA: Diagnosis not present

## 2019-06-22 DIAGNOSIS — R0989 Other specified symptoms and signs involving the circulatory and respiratory systems: Secondary | ICD-10-CM

## 2019-06-22 MED ORDER — REPATHA SURECLICK 140 MG/ML ~~LOC~~ SOAJ: 1.0000 "pen " | SUBCUTANEOUS | 11 refills | Status: DC

## 2019-06-22 NOTE — Patient Instructions (Signed)

## 2019-06-22 NOTE — Telephone Encounter (Addendum)
Pt was previously seen in lipid clinic 3x in 2020 with encouragement to start Repatha given intolerance to multiple statins. She was finally agreeable to this during December 2020 phone call, however reported today at office visit with Dr Tamala Julian that she never started therapy.  Prior authorization is still on file through 04/20/20. She qualifies for $5 copay card but needs to call to activate this at [phone].  Left message for pt to discuss.

## 2019-06-22 NOTE — Telephone Encounter (Signed)
Spoke with pt, she is still hesitant to start [place] but is eventually agreeable. She will call to activate copay card and will call clinic when she picks up her first rx - she wishes to give her first injection in the office.

## 2019-06-23 ENCOUNTER — Other Ambulatory Visit: Payer: Self-pay | Admitting: Internal Medicine

## 2019-06-23 DIAGNOSIS — M25511 Pain in right shoulder: Secondary | ICD-10-CM | POA: Diagnosis not present

## 2019-06-23 DIAGNOSIS — M542 Cervicalgia: Secondary | ICD-10-CM | POA: Diagnosis not present

## 2019-06-23 DIAGNOSIS — M79604 Pain in right leg: Secondary | ICD-10-CM | POA: Diagnosis not present

## 2019-06-23 DIAGNOSIS — M545 Low back pain: Secondary | ICD-10-CM | POA: Diagnosis not present

## 2019-06-23 DIAGNOSIS — M79605 Pain in left leg: Secondary | ICD-10-CM | POA: Diagnosis not present

## 2019-06-23 DIAGNOSIS — I739 Peripheral vascular disease, unspecified: Secondary | ICD-10-CM

## 2019-06-28 ENCOUNTER — Encounter (HOSPITAL_COMMUNITY): Payer: Medicare Other

## 2019-06-28 DIAGNOSIS — M79604 Pain in right leg: Secondary | ICD-10-CM | POA: Diagnosis not present

## 2019-06-28 DIAGNOSIS — M25511 Pain in right shoulder: Secondary | ICD-10-CM | POA: Diagnosis not present

## 2019-06-28 DIAGNOSIS — M545 Low back pain: Secondary | ICD-10-CM | POA: Diagnosis not present

## 2019-06-28 DIAGNOSIS — M542 Cervicalgia: Secondary | ICD-10-CM | POA: Diagnosis not present

## 2019-06-28 DIAGNOSIS — M79605 Pain in left leg: Secondary | ICD-10-CM | POA: Diagnosis not present

## 2019-06-30 DIAGNOSIS — M542 Cervicalgia: Secondary | ICD-10-CM | POA: Diagnosis not present

## 2019-06-30 DIAGNOSIS — M545 Low back pain: Secondary | ICD-10-CM | POA: Diagnosis not present

## 2019-06-30 DIAGNOSIS — M79605 Pain in left leg: Secondary | ICD-10-CM | POA: Diagnosis not present

## 2019-06-30 DIAGNOSIS — M25511 Pain in right shoulder: Secondary | ICD-10-CM | POA: Diagnosis not present

## 2019-06-30 DIAGNOSIS — M79604 Pain in right leg: Secondary | ICD-10-CM | POA: Diagnosis not present

## 2019-07-01 ENCOUNTER — Telehealth: Payer: Self-pay | Admitting: Family Medicine

## 2019-07-01 DIAGNOSIS — M5416 Radiculopathy, lumbar region: Secondary | ICD-10-CM | POA: Diagnosis not present

## 2019-07-01 DIAGNOSIS — M4316 Spondylolisthesis, lumbar region: Secondary | ICD-10-CM | POA: Diagnosis not present

## 2019-07-01 DIAGNOSIS — M48062 Spinal stenosis, lumbar region with neurogenic claudication: Secondary | ICD-10-CM | POA: Diagnosis not present

## 2019-07-01 NOTE — Telephone Encounter (Signed)
Patient called stating that she was given a prescription for Vitamin D. She did not know anything about it. And was concerned if it was appropriate to take with her high calcium levels.   Patient would like a call back (okay to leave a message).

## 2019-07-01 NOTE — Telephone Encounter (Signed)
Sent patient reply in [place].

## 2019-07-04 NOTE — Telephone Encounter (Signed)
Courtney [name]'s real question is "is it OK to take the D3 with her history of elevated calcium?" Please leave a voicemail for patient, she is driving and will not check her MyChart.

## 2019-07-05 ENCOUNTER — Encounter (HOSPITAL_COMMUNITY): Payer: Medicare Other

## 2019-07-13 DIAGNOSIS — Z6823 Body mass index (BMI) 23.0-23.9, adult: Secondary | ICD-10-CM | POA: Diagnosis not present

## 2019-07-13 DIAGNOSIS — M0579 Rheumatoid arthritis with rheumatoid factor of multiple sites without organ or systems involvement: Secondary | ICD-10-CM | POA: Diagnosis not present

## 2019-07-13 DIAGNOSIS — M2550 Pain in unspecified joint: Secondary | ICD-10-CM | POA: Diagnosis not present

## 2019-07-13 DIAGNOSIS — M150 Primary generalized (osteo)arthritis: Secondary | ICD-10-CM | POA: Diagnosis not present

## 2019-07-14 ENCOUNTER — Other Ambulatory Visit: Payer: Self-pay

## 2019-07-14 ENCOUNTER — Ambulatory Visit (HOSPITAL_COMMUNITY)
Admission: RE | Admit: 2019-07-14 | Discharge: 2019-07-14 | Disposition: A | Discharge status: Home / Self Care | Payer: Medicare Other | Source: Ambulatory Visit | Attending: Internal Medicine | Admitting: Internal Medicine

## 2019-07-14 DIAGNOSIS — I739 Peripheral vascular disease, unspecified: Secondary | ICD-10-CM

## 2019-07-14 DIAGNOSIS — M545 Low back pain: Secondary | ICD-10-CM | POA: Diagnosis not present

## 2019-07-14 DIAGNOSIS — M48062 Spinal stenosis, lumbar region with neurogenic claudication: Secondary | ICD-10-CM | POA: Diagnosis not present

## 2019-07-15 DIAGNOSIS — M542 Cervicalgia: Secondary | ICD-10-CM | POA: Diagnosis not present

## 2019-07-15 DIAGNOSIS — M25511 Pain in right shoulder: Secondary | ICD-10-CM | POA: Diagnosis not present

## 2019-07-15 DIAGNOSIS — M79605 Pain in left leg: Secondary | ICD-10-CM | POA: Diagnosis not present

## 2019-07-15 DIAGNOSIS — M79604 Pain in right leg: Secondary | ICD-10-CM | POA: Diagnosis not present

## 2019-07-15 DIAGNOSIS — M545 Low back pain: Secondary | ICD-10-CM | POA: Diagnosis not present

## 2019-07-19 DIAGNOSIS — F411 Generalized anxiety disorder: Secondary | ICD-10-CM | POA: Diagnosis not present

## 2019-07-19 DIAGNOSIS — F900 Attention-deficit hyperactivity disorder, predominantly inattentive type: Secondary | ICD-10-CM | POA: Diagnosis not present

## 2019-07-19 DIAGNOSIS — F3342 Major depressive disorder, recurrent, in full remission: Secondary | ICD-10-CM | POA: Diagnosis not present

## 2019-07-20 DIAGNOSIS — M48062 Spinal stenosis, lumbar region with neurogenic claudication: Secondary | ICD-10-CM | POA: Diagnosis not present

## 2019-07-20 DIAGNOSIS — M542 Cervicalgia: Secondary | ICD-10-CM | POA: Diagnosis not present

## 2019-07-20 DIAGNOSIS — M4316 Spondylolisthesis, lumbar region: Secondary | ICD-10-CM | POA: Diagnosis not present

## 2019-07-20 DIAGNOSIS — M4150 Other secondary scoliosis, site unspecified: Secondary | ICD-10-CM | POA: Diagnosis not present

## 2019-07-21 DIAGNOSIS — M79604 Pain in right leg: Secondary | ICD-10-CM | POA: Diagnosis not present

## 2019-07-21 DIAGNOSIS — M79605 Pain in left leg: Secondary | ICD-10-CM | POA: Diagnosis not present

## 2019-07-21 DIAGNOSIS — M542 Cervicalgia: Secondary | ICD-10-CM | POA: Diagnosis not present

## 2019-07-21 DIAGNOSIS — M545 Low back pain: Secondary | ICD-10-CM | POA: Diagnosis not present

## 2019-07-21 DIAGNOSIS — M25511 Pain in right shoulder: Secondary | ICD-10-CM | POA: Diagnosis not present

## 2019-07-25 DIAGNOSIS — M79605 Pain in left leg: Secondary | ICD-10-CM | POA: Diagnosis not present

## 2019-07-25 DIAGNOSIS — M79604 Pain in right leg: Secondary | ICD-10-CM | POA: Diagnosis not present

## 2019-07-25 DIAGNOSIS — M25511 Pain in right shoulder: Secondary | ICD-10-CM | POA: Diagnosis not present

## 2019-07-25 DIAGNOSIS — M545 Low back pain: Secondary | ICD-10-CM | POA: Diagnosis not present

## 2019-07-25 DIAGNOSIS — M542 Cervicalgia: Secondary | ICD-10-CM | POA: Diagnosis not present

## 2019-07-27 ENCOUNTER — Ambulatory Visit: Payer: Medicare Other | Admitting: Family Medicine

## 2019-07-28 DIAGNOSIS — M79605 Pain in left leg: Secondary | ICD-10-CM | POA: Diagnosis not present

## 2019-07-28 DIAGNOSIS — M79604 Pain in right leg: Secondary | ICD-10-CM | POA: Diagnosis not present

## 2019-07-28 DIAGNOSIS — M542 Cervicalgia: Secondary | ICD-10-CM | POA: Diagnosis not present

## 2019-07-28 DIAGNOSIS — M545 Low back pain: Secondary | ICD-10-CM | POA: Diagnosis not present

## 2019-07-28 DIAGNOSIS — M25511 Pain in right shoulder: Secondary | ICD-10-CM | POA: Diagnosis not present

## 2019-08-01 DIAGNOSIS — M25511 Pain in right shoulder: Secondary | ICD-10-CM | POA: Diagnosis not present

## 2019-08-01 DIAGNOSIS — M79604 Pain in right leg: Secondary | ICD-10-CM | POA: Diagnosis not present

## 2019-08-01 DIAGNOSIS — M545 Low back pain: Secondary | ICD-10-CM | POA: Diagnosis not present

## 2019-08-01 DIAGNOSIS — M542 Cervicalgia: Secondary | ICD-10-CM | POA: Diagnosis not present

## 2019-08-01 DIAGNOSIS — M79605 Pain in left leg: Secondary | ICD-10-CM | POA: Diagnosis not present

## 2019-08-03 DIAGNOSIS — M5481 Occipital neuralgia: Secondary | ICD-10-CM | POA: Diagnosis not present

## 2019-08-15 DIAGNOSIS — M79604 Pain in right leg: Secondary | ICD-10-CM | POA: Diagnosis not present

## 2019-08-15 DIAGNOSIS — M545 Low back pain: Secondary | ICD-10-CM | POA: Diagnosis not present

## 2019-08-15 DIAGNOSIS — M79605 Pain in left leg: Secondary | ICD-10-CM | POA: Diagnosis not present

## 2019-08-15 DIAGNOSIS — M25511 Pain in right shoulder: Secondary | ICD-10-CM | POA: Diagnosis not present

## 2019-08-15 DIAGNOSIS — M542 Cervicalgia: Secondary | ICD-10-CM | POA: Diagnosis not present

## 2019-08-18 DIAGNOSIS — M545 Low back pain: Secondary | ICD-10-CM | POA: Diagnosis not present

## 2019-08-18 DIAGNOSIS — M542 Cervicalgia: Secondary | ICD-10-CM | POA: Diagnosis not present

## 2019-08-18 DIAGNOSIS — M25511 Pain in right shoulder: Secondary | ICD-10-CM | POA: Diagnosis not present

## 2019-08-18 DIAGNOSIS — M79604 Pain in right leg: Secondary | ICD-10-CM | POA: Diagnosis not present

## 2019-08-18 DIAGNOSIS — M79605 Pain in left leg: Secondary | ICD-10-CM | POA: Diagnosis not present

## 2019-08-25 DIAGNOSIS — M79604 Pain in right leg: Secondary | ICD-10-CM | POA: Diagnosis not present

## 2019-08-25 DIAGNOSIS — M542 Cervicalgia: Secondary | ICD-10-CM | POA: Diagnosis not present

## 2019-08-25 DIAGNOSIS — M79605 Pain in left leg: Secondary | ICD-10-CM | POA: Diagnosis not present

## 2019-08-25 DIAGNOSIS — M545 Low back pain: Secondary | ICD-10-CM | POA: Diagnosis not present

## 2019-08-25 DIAGNOSIS — M25511 Pain in right shoulder: Secondary | ICD-10-CM | POA: Diagnosis not present

## 2019-08-30 ENCOUNTER — Ambulatory Visit (INDEPENDENT_AMBULATORY_CARE_PROVIDER_SITE_OTHER): Payer: Medicare Other | Admitting: Psychiatry

## 2019-08-30 ENCOUNTER — Encounter: Payer: Self-pay | Admitting: Psychiatry

## 2019-08-30 ENCOUNTER — Other Ambulatory Visit: Payer: Self-pay

## 2019-08-30 DIAGNOSIS — F4323 Adjustment disorder with mixed anxiety and depressed mood: Secondary | ICD-10-CM

## 2019-08-30 NOTE — Progress Notes (Signed)
      [facility] Counselor/Therapist Progress Note  Patient ID: Courtney [name], MRN: [id number],    Date: 08/30/2019  Time Spent: 50 minutes   Treatment Type: Individual Therapy  Reported Symptoms: anxious, irritable  Mental Status Exam:  Appearance:   Casual     Behavior:  Appropriate  Motor:  Normal  Speech/Language:   Clear and Coherent  Affect:  Appropriate  Mood:  anxious and irritable  Thought process:  normal  Thought content:    WNL  Sensory/Perceptual disturbances:    WNL  Orientation:  oriented to person, place, time/date and situation  Attention:  Good  Concentration:  Good  Memory:  WNL  Fund of knowledge:   Good  Insight:    Good  Judgment:   Good  Impulse Control:  Good   Risk Assessment: Danger to Self:  No Self-injurious Behavior: No Danger to Others: No Duty to Warn:no Physical Aggression / Violence:No  Access to Firearms a concern: No  Gang Involvement:No   Subjective: The client has been separated from her husband for the last few months.  "When I am around my husband I believe he is the source of my chaos and lack of control in my whole life."  As the client has been pondering this, she realizes that it means that she has this external locus of control and no internal locus.  She realizes that is incorrect.  Today we used eye-movement focusing on her husband.  Her negative cognition is, "he is the source of chaos and lack of control."  She feels irritability in her stomach.  Her subjective units of distress is an 8+.  As the client processed that she realizes that she has abdicated her control.  She then went to an image of her as a 7-year-old girl.  She realized that her mother was the source of her chaos and lack of control then.  As she went into adolescence it became boyfriends.  As the client continued to process this, I asked her what did she need to do to take back her control?  She was not sure what her first step should be.  She knows that  she does not want to be back with her husband but they need to separate their property and finances.  That is an overwhelming concept to her.  I suggested to the client that she start with one thing, complete that and then move on to the next logical thing.  The client agrees and will work on this.  Interventions: Assertiveness/Communication, Mindfulness Meditation, Motivational Interviewing, Solution-Oriented/Positive Psychology, [facility] Desensitization and Reprocessing (EMDR) and Insight-Oriented  Diagnosis:   ICD-10-CM   1. Adjustment disorder with mixed anxiety and depressed mood  F43.23     Plan: Mindful prayer, mood independent behavior, positive self talk, self-care, assertiveness, boundaries.  Djuna Frechette, [facility]

## 2019-09-01 ENCOUNTER — Telehealth: Payer: Self-pay

## 2019-09-01 DIAGNOSIS — M545 Low back pain: Secondary | ICD-10-CM | POA: Diagnosis not present

## 2019-09-01 DIAGNOSIS — M25511 Pain in right shoulder: Secondary | ICD-10-CM | POA: Diagnosis not present

## 2019-09-01 DIAGNOSIS — M79605 Pain in left leg: Secondary | ICD-10-CM | POA: Diagnosis not present

## 2019-09-01 DIAGNOSIS — M542 Cervicalgia: Secondary | ICD-10-CM | POA: Diagnosis not present

## 2019-09-01 DIAGNOSIS — M79604 Pain in right leg: Secondary | ICD-10-CM | POA: Diagnosis not present

## 2019-09-01 NOTE — Telephone Encounter (Signed)
Spoke with pt and states she will come in next week to have the blood test done. She also states she has spoke with the Rheumatologist and Dermatologist and they informed her that what was going on had nothing to do with them and they couldn't help her. The ulcers have not returned so she did know if you would still want to do a biopsy.

## 2019-09-01 NOTE — Telephone Encounter (Signed)
  New message    The patient wants to CMA to call her to discuss my chart messages from Dr. Darius Bump that was noted at the end of Feb and the suggestion was made

## 2019-09-02 NOTE — Telephone Encounter (Signed)
We do not need to do the biopsy if there is no active lesions.  Thanks

## 2019-09-06 NOTE — Telephone Encounter (Signed)
LM notifying pt

## 2019-09-09 ENCOUNTER — Other Ambulatory Visit: Payer: Medicare Other

## 2019-09-09 DIAGNOSIS — M79604 Pain in right leg: Secondary | ICD-10-CM

## 2019-09-09 DIAGNOSIS — E785 Hyperlipidemia, unspecified: Secondary | ICD-10-CM | POA: Diagnosis not present

## 2019-09-09 DIAGNOSIS — M79605 Pain in left leg: Secondary | ICD-10-CM | POA: Diagnosis not present

## 2019-09-09 DIAGNOSIS — L97501 Non-pressure chronic ulcer of other part of unspecified foot limited to breakdown of skin: Secondary | ICD-10-CM

## 2019-09-12 ENCOUNTER — Telehealth: Payer: Self-pay | Admitting: Internal Medicine

## 2019-09-12 NOTE — Telephone Encounter (Signed)
    Patient requesting order to have labs to determine her blood type

## 2019-09-13 LAB — MTHFR DNA ANALYSIS

## 2019-09-14 ENCOUNTER — Other Ambulatory Visit: Payer: Self-pay | Admitting: Internal Medicine

## 2019-09-14 DIAGNOSIS — E559 Vitamin D deficiency, unspecified: Secondary | ICD-10-CM

## 2019-09-14 MED ORDER — OMEGA-3-ACID ETHYL ESTERS 1 G PO CAPS: 2.0000 g | ORAL_CAPSULE | Freq: Two times a day (BID) | ORAL | 11 refills | Status: AC

## 2019-09-15 ENCOUNTER — Ambulatory Visit: Payer: Medicare Other | Admitting: Psychiatry

## 2019-09-15 DIAGNOSIS — F411 Generalized anxiety disorder: Secondary | ICD-10-CM | POA: Diagnosis not present

## 2019-09-15 DIAGNOSIS — F3342 Major depressive disorder, recurrent, in full remission: Secondary | ICD-10-CM | POA: Diagnosis not present

## 2019-09-15 DIAGNOSIS — F900 Attention-deficit hyperactivity disorder, predominantly inattentive type: Secondary | ICD-10-CM | POA: Diagnosis not present

## 2019-09-16 ENCOUNTER — Other Ambulatory Visit (INDEPENDENT_AMBULATORY_CARE_PROVIDER_SITE_OTHER): Payer: Medicare Other

## 2019-09-16 DIAGNOSIS — E559 Vitamin D deficiency, unspecified: Secondary | ICD-10-CM | POA: Diagnosis not present

## 2019-09-16 DIAGNOSIS — E785 Hyperlipidemia, unspecified: Secondary | ICD-10-CM

## 2019-09-16 LAB — HEPATIC FUNCTION PANEL
ALT: 15 U/L (ref 0–35)
AST: 19 U/L (ref 0–37)
Albumin: 4.5 g/dL (ref 3.5–5.2)
Alkaline Phosphatase: 57 U/L (ref 39–117)
Bilirubin, Direct: 0 mg/dL (ref 0.0–0.3)
Total Bilirubin: 0.5 mg/dL (ref 0.2–1.2)
Total Protein: 7.2 g/dL (ref 6.0–8.3)

## 2019-09-16 LAB — BASIC METABOLIC PANEL
BUN: 12 mg/dL (ref 6–23)
CO2: 31 mEq/L (ref 19–32)
Calcium: 9.8 mg/dL (ref 8.4–10.5)
Chloride: 98 mEq/L (ref 96–112)
Creatinine, Ser: 0.7 mg/dL (ref 0.40–1.20)
GFR: 82.53 mL/min (ref >60.00)
Glucose, Bld: 97 mg/dL (ref 70–99)
Potassium: 4.1 mEq/L (ref 3.5–5.1)
Sodium: 130 mEq/L — ABNORMAL LOW (ref 135–145)

## 2019-09-16 LAB — LIPID PANEL
Cholesterol: 263 mg/dL — ABNORMAL HIGH (ref 0–200)
HDL: 112.2 mg/dL (ref >39.00)
LDL Cholesterol: 138 mg/dL — ABNORMAL HIGH (ref 0–99)
NonHDL: 150.71
Total CHOL/HDL Ratio: 2
Triglycerides: 64 mg/dL (ref 0.0–149.0)
VLDL: 12.8 mg/dL (ref 0.0–40.0)

## 2019-09-16 LAB — VITAMIN D 25 HYDROXY (VIT D DEFICIENCY, FRACTURES): VITD: 92.83 ng/mL (ref 30.00–100.00)

## 2019-09-19 DIAGNOSIS — M542 Cervicalgia: Secondary | ICD-10-CM | POA: Diagnosis not present

## 2019-09-19 DIAGNOSIS — M545 Low back pain: Secondary | ICD-10-CM | POA: Diagnosis not present

## 2019-09-19 DIAGNOSIS — M79604 Pain in right leg: Secondary | ICD-10-CM | POA: Diagnosis not present

## 2019-09-19 DIAGNOSIS — M79605 Pain in left leg: Secondary | ICD-10-CM | POA: Diagnosis not present

## 2019-09-19 DIAGNOSIS — M25511 Pain in right shoulder: Secondary | ICD-10-CM | POA: Diagnosis not present

## 2019-09-23 ENCOUNTER — Ambulatory Visit: Payer: Medicare Other | Admitting: Psychiatry

## 2019-09-29 DIAGNOSIS — L814 Other melanin hyperpigmentation: Secondary | ICD-10-CM | POA: Diagnosis not present

## 2019-09-29 DIAGNOSIS — D229 Melanocytic nevi, unspecified: Secondary | ICD-10-CM | POA: Diagnosis not present

## 2019-09-29 DIAGNOSIS — Z419 Encounter for procedure for purposes other than remedying health state, unspecified: Secondary | ICD-10-CM | POA: Diagnosis not present

## 2019-09-29 DIAGNOSIS — Z7189 Other specified counseling: Secondary | ICD-10-CM | POA: Diagnosis not present

## 2019-10-06 DIAGNOSIS — M79604 Pain in right leg: Secondary | ICD-10-CM | POA: Diagnosis not present

## 2019-10-06 DIAGNOSIS — M545 Low back pain: Secondary | ICD-10-CM | POA: Diagnosis not present

## 2019-10-06 DIAGNOSIS — M79605 Pain in left leg: Secondary | ICD-10-CM | POA: Diagnosis not present

## 2019-10-06 DIAGNOSIS — M25511 Pain in right shoulder: Secondary | ICD-10-CM | POA: Diagnosis not present

## 2019-10-06 DIAGNOSIS — M542 Cervicalgia: Secondary | ICD-10-CM | POA: Diagnosis not present

## 2019-10-11 MED ORDER — ROSUVASTATIN CALCIUM 5 MG PO TABS
5.0000 mg | ORAL_TABLET | ORAL | 3 refills | Status: DC
Start: 2019-10-12 — End: 2020-04-10

## 2019-10-12 DIAGNOSIS — M25511 Pain in right shoulder: Secondary | ICD-10-CM | POA: Diagnosis not present

## 2019-10-12 DIAGNOSIS — M79604 Pain in right leg: Secondary | ICD-10-CM | POA: Diagnosis not present

## 2019-10-12 DIAGNOSIS — M79605 Pain in left leg: Secondary | ICD-10-CM | POA: Diagnosis not present

## 2019-10-12 DIAGNOSIS — M542 Cervicalgia: Secondary | ICD-10-CM | POA: Diagnosis not present

## 2019-10-12 DIAGNOSIS — M545 Low back pain: Secondary | ICD-10-CM | POA: Diagnosis not present

## 2019-10-17 DIAGNOSIS — M79605 Pain in left leg: Secondary | ICD-10-CM | POA: Diagnosis not present

## 2019-10-17 DIAGNOSIS — M79604 Pain in right leg: Secondary | ICD-10-CM | POA: Diagnosis not present

## 2019-10-17 DIAGNOSIS — M542 Cervicalgia: Secondary | ICD-10-CM | POA: Diagnosis not present

## 2019-10-17 DIAGNOSIS — M545 Low back pain: Secondary | ICD-10-CM | POA: Diagnosis not present

## 2019-10-17 DIAGNOSIS — M25511 Pain in right shoulder: Secondary | ICD-10-CM | POA: Diagnosis not present

## 2019-10-19 ENCOUNTER — Other Ambulatory Visit: Payer: Self-pay | Admitting: *Deleted

## 2019-10-22 ENCOUNTER — Encounter: Payer: Self-pay | Admitting: Internal Medicine

## 2019-10-24 DIAGNOSIS — M79604 Pain in right leg: Secondary | ICD-10-CM | POA: Diagnosis not present

## 2019-10-24 DIAGNOSIS — M545 Low back pain: Secondary | ICD-10-CM | POA: Diagnosis not present

## 2019-10-24 DIAGNOSIS — M542 Cervicalgia: Secondary | ICD-10-CM | POA: Diagnosis not present

## 2019-10-24 DIAGNOSIS — M25511 Pain in right shoulder: Secondary | ICD-10-CM | POA: Diagnosis not present

## 2019-10-24 DIAGNOSIS — M79605 Pain in left leg: Secondary | ICD-10-CM | POA: Diagnosis not present

## 2019-10-26 ENCOUNTER — Other Ambulatory Visit: Payer: Self-pay | Admitting: Internal Medicine

## 2019-10-26 MED ORDER — METHOCARBAMOL 500 MG PO TABS: 500.0000 mg | ORAL_TABLET | Freq: Four times a day (QID) | ORAL | 1 refills | Status: DC

## 2019-10-26 MED ORDER — TRAMADOL HCL 50 MG PO TABS: 25.0000 mg | ORAL_TABLET | Freq: Three times a day (TID) | ORAL | 1 refills | Status: DC | PRN

## 2019-11-02 DIAGNOSIS — M542 Cervicalgia: Secondary | ICD-10-CM | POA: Diagnosis not present

## 2019-11-02 DIAGNOSIS — M25511 Pain in right shoulder: Secondary | ICD-10-CM | POA: Diagnosis not present

## 2019-11-02 DIAGNOSIS — M79605 Pain in left leg: Secondary | ICD-10-CM | POA: Diagnosis not present

## 2019-11-02 DIAGNOSIS — M79604 Pain in right leg: Secondary | ICD-10-CM | POA: Diagnosis not present

## 2019-11-02 DIAGNOSIS — M545 Low back pain: Secondary | ICD-10-CM | POA: Diagnosis not present

## 2019-12-15 DIAGNOSIS — F3342 Major depressive disorder, recurrent, in full remission: Secondary | ICD-10-CM | POA: Diagnosis not present

## 2019-12-15 DIAGNOSIS — F411 Generalized anxiety disorder: Secondary | ICD-10-CM | POA: Diagnosis not present

## 2019-12-15 DIAGNOSIS — F900 Attention-deficit hyperactivity disorder, predominantly inattentive type: Secondary | ICD-10-CM | POA: Diagnosis not present

## 2019-12-29 ENCOUNTER — Ambulatory Visit: Payer: Medicare Other | Admitting: Psychiatry

## 2019-12-30 ENCOUNTER — Other Ambulatory Visit: Payer: Self-pay | Admitting: Internal Medicine

## 2019-12-30 MED ORDER — TRAMADOL HCL 50 MG PO TABS: 25.0000 mg | ORAL_TABLET | Freq: Three times a day (TID) | ORAL | 1 refills | Status: DC | PRN

## 2020-01-05 ENCOUNTER — Other Ambulatory Visit: Payer: Self-pay | Admitting: Internal Medicine

## 2020-01-10 DIAGNOSIS — T520X1A Toxic effect of petroleum products, accidental (unintentional), initial encounter: Secondary | ICD-10-CM | POA: Diagnosis not present

## 2020-01-10 DIAGNOSIS — R110 Nausea: Secondary | ICD-10-CM | POA: Diagnosis not present

## 2020-01-10 DIAGNOSIS — T5992XA Toxic effect of unspecified gases, fumes and vapors, intentional self-harm, initial encounter: Secondary | ICD-10-CM | POA: Diagnosis not present

## 2020-01-10 DIAGNOSIS — H10213 Acute toxic conjunctivitis, bilateral: Secondary | ICD-10-CM | POA: Diagnosis not present

## 2020-01-10 DIAGNOSIS — T2056XA Corrosion of first degree of forehead and cheek, initial encounter: Secondary | ICD-10-CM | POA: Diagnosis not present

## 2020-01-10 DIAGNOSIS — T2250XA Corrosion of first degree of shoulder and upper limb, except wrist and hand unspecified site, initial encounter: Secondary | ICD-10-CM | POA: Diagnosis not present

## 2020-01-10 DIAGNOSIS — R531 Weakness: Secondary | ICD-10-CM | POA: Diagnosis not present

## 2020-01-10 DIAGNOSIS — T320 Corrosions involving less than 10% of body surface: Secondary | ICD-10-CM | POA: Diagnosis not present

## 2020-01-10 DIAGNOSIS — T6591XA Toxic effect of unspecified substance, accidental (unintentional), initial encounter: Secondary | ICD-10-CM | POA: Diagnosis not present

## 2020-01-18 ENCOUNTER — Ambulatory Visit: Payer: Medicare Other | Admitting: Psychiatry

## 2020-01-30 DIAGNOSIS — M4150 Other secondary scoliosis, site unspecified: Secondary | ICD-10-CM | POA: Diagnosis not present

## 2020-01-30 DIAGNOSIS — M542 Cervicalgia: Secondary | ICD-10-CM | POA: Diagnosis not present

## 2020-01-30 DIAGNOSIS — M160 Bilateral primary osteoarthritis of hip: Secondary | ICD-10-CM | POA: Diagnosis not present

## 2020-01-30 DIAGNOSIS — M1990 Unspecified osteoarthritis, unspecified site: Secondary | ICD-10-CM | POA: Diagnosis not present

## 2020-01-30 DIAGNOSIS — M5481 Occipital neuralgia: Secondary | ICD-10-CM | POA: Diagnosis not present

## 2020-01-30 DIAGNOSIS — M19019 Primary osteoarthritis, unspecified shoulder: Secondary | ICD-10-CM | POA: Diagnosis not present

## 2020-01-30 DIAGNOSIS — M48062 Spinal stenosis, lumbar region with neurogenic claudication: Secondary | ICD-10-CM | POA: Diagnosis not present

## 2020-01-30 DIAGNOSIS — M4316 Spondylolisthesis, lumbar region: Secondary | ICD-10-CM | POA: Diagnosis not present

## 2020-02-01 DIAGNOSIS — M79605 Pain in left leg: Secondary | ICD-10-CM | POA: Diagnosis not present

## 2020-02-01 DIAGNOSIS — Z6822 Body mass index (BMI) 22.0-22.9, adult: Secondary | ICD-10-CM | POA: Diagnosis not present

## 2020-02-01 DIAGNOSIS — M150 Primary generalized (osteo)arthritis: Secondary | ICD-10-CM | POA: Diagnosis not present

## 2020-02-01 DIAGNOSIS — M0579 Rheumatoid arthritis with rheumatoid factor of multiple sites without organ or systems involvement: Secondary | ICD-10-CM | POA: Diagnosis not present

## 2020-02-01 DIAGNOSIS — M2550 Pain in unspecified joint: Secondary | ICD-10-CM | POA: Diagnosis not present

## 2020-02-06 DIAGNOSIS — M75121 Complete rotator cuff tear or rupture of right shoulder, not specified as traumatic: Secondary | ICD-10-CM | POA: Diagnosis not present

## 2020-02-06 DIAGNOSIS — M85811 Other specified disorders of bone density and structure, right shoulder: Secondary | ICD-10-CM | POA: Diagnosis not present

## 2020-02-06 DIAGNOSIS — M19011 Primary osteoarthritis, right shoulder: Secondary | ICD-10-CM | POA: Diagnosis not present

## 2020-02-06 DIAGNOSIS — Z881 Allergy status to other antibiotic agents status: Secondary | ICD-10-CM | POA: Diagnosis not present

## 2020-02-06 DIAGNOSIS — M25511 Pain in right shoulder: Secondary | ICD-10-CM | POA: Diagnosis not present

## 2020-02-06 DIAGNOSIS — Z885 Allergy status to narcotic agent status: Secondary | ICD-10-CM | POA: Diagnosis not present

## 2020-02-17 DIAGNOSIS — M25562 Pain in left knee: Secondary | ICD-10-CM | POA: Diagnosis not present

## 2020-02-17 DIAGNOSIS — M1712 Unilateral primary osteoarthritis, left knee: Secondary | ICD-10-CM | POA: Diagnosis not present

## 2020-03-01 ENCOUNTER — Encounter: Payer: Self-pay | Admitting: Psychiatry

## 2020-03-01 ENCOUNTER — Other Ambulatory Visit: Payer: Self-pay

## 2020-03-01 ENCOUNTER — Ambulatory Visit (INDEPENDENT_AMBULATORY_CARE_PROVIDER_SITE_OTHER): Payer: Medicare Other | Admitting: Psychiatry

## 2020-03-01 VITALS — BP 146/82 | HR 89 | Ht 59.0 in | Wt 114.0 lb

## 2020-03-01 DIAGNOSIS — F902 Attention-deficit hyperactivity disorder, combined type: Secondary | ICD-10-CM

## 2020-03-01 DIAGNOSIS — I2510 Atherosclerotic heart disease of native coronary artery without angina pectoris: Secondary | ICD-10-CM | POA: Diagnosis not present

## 2020-03-01 DIAGNOSIS — F411 Generalized anxiety disorder: Secondary | ICD-10-CM

## 2020-03-01 MED ORDER — VIIBRYD 20 MG PO TABS: 1.0000 | ORAL_TABLET | Freq: Every day | ORAL | 1 refills | Status: DC

## 2020-03-01 MED ORDER — METHYLPHENIDATE HCL ER (OSM) 54 MG PO TBCR: 54.0000 mg | EXTENDED_RELEASE_TABLET | ORAL | 0 refills | Status: DC

## 2020-03-01 NOTE — Progress Notes (Signed)
[facility] MD/PA/NP Initial Note  03/01/2020 12:10 PM Courtney [name]  MRN:  [id number]  Chief Complaint:  Chief Complaint    ADHD; undiagnosed dyslexic; Anxiety      HPI: Referred by friend Courtney [name].  Used to go to psychiatrist Courtney Moynahan MD and ended up with Courtney [name].  Tried Trintellix and then Vyvanse and questions about dosing. Doesn't feel happy about meds. Is interested in getting the [place] testing.  Wonders about whether she should be doing something bc it feels like guesswork.    Raised in [place] and lived in [place] and moved here 26 years ago.   Was suggested she try antidepressant and don't remember any benefit.  She's felt it was more about anxiety.  Not sure ever was depressed but is anxious.  No panic but worry and angry lately.  Can get rageful with adrenaline rushes.  Anxiety is constant.  No history of OCD.  Except wants to anticipate all possible needs or problems and take care of them ahead of time.  Brain constantly going.   On Trintellix several years ago without help .  Always fought sx by being very active even from childhood.   Has done some therapy but hard to stay calm enough. Some out of control circumstances and not remaining calm enough to stay on task and take care of things.  Financial issues, wills, trusts she has to deal with and overwhelms her. Vyvanse 50 AM and has dry mouth she doesn't like.  It does help her moving forward and focused on tasks.  Without it seems to slump into inactivity. Without stimulant can't complete tasks, gets overwhelmed and sees everything at the same time. Before took Adderall. When in college was told adrenals were exhausted. When don't take anything then feels more down.  Never took MPH as far as she remembers..  No Prozac.  Zoloft took but don't remember it's effect.  ? Paxil.  Would tend to stop things that affected her stomach.  Hx Lexapro. ? Effect.  Sleep [name].  Appetite not great for years.  Lost  weight over the years.    Concerns about meds.  Visit Diagnosis:    ICD-10-CM   1. Generalized anxiety disorder  F41.1 Vilazodone HCl (VIIBRYD) 20 MG TABS  2. Attention deficit hyperactivity disorder (ADHD), combined type  F90.2 methylphenidate (CONCERTA) 54 MG PO CR tablet    Past Psychiatric History: Courtney [name] as noted.  Past Medical History:  Heart problems stable except cholesterol Past Medical History:  Diagnosis Date  . ADD (attention deficit disorder)   . Anxiety   . Celiac disease    possible vs IBS  . Depression    no bipolar per Dr. [name]  . GI problem    Courtney [name]  . Hyperlipidemia   . IBS (irritable bowel syndrome)   . Osteoarthritis   . Osteoporosis   . Seasonal allergies 01-08-12   hx. of multiple bronchitis related to this.  . Shoulder pain, right     Past Surgical History:  Procedure Laterality Date  . EXPLORATORY LAPAROTOMY  01-08-12   due to endometriosis-portions of both ovaries removed  . TONSILLECTOMY    . TOTAL HIP ARTHROPLASTY     right. 2002  . TOTAL HIP ARTHROPLASTY  01/14/2012   Procedure: TOTAL HIP ARTHROPLASTY;  Surgeon: Courtney Alf, MD;  Location: [facility];  Service: Orthopedics;  Laterality: Left;  [name] VENTRAL HERNIA REPAIR  01-08-12   abdominal    Family Psychiatric History: depression untreated.  Mo was chronically ill with first MI at 69 yo from rheumatic fever and another while giving birth to pt. No D&A abuse history.*/  Family History:  Family History  Problem Relation Age of Onset  . Heart disease Mother   . Heart attack Mother   . Prostate cancer Father   . Coronary artery disease Unknown        FH Female 1st degree relative <60  . ADD / ADHD Unknown     Social History:  Was architectural designer Married Courtney [name] for 43 years. 2 kids Courtney [name] 7, Courtney [name] 31. Doing well out [place].   Remote history of speed use and diet pills that helped her get things done.  No negative consequences. No alcohol abuse.  Social  History   Socioeconomic History  . Marital status: Married    Spouse name: Not on file  . Number of children: Not on file  . Years of education: Not on file  . Highest education level: Not on file  Occupational History  . Occupation: Futures trader  Tobacco Use  . Smoking status: Never Smoker  . Smokeless tobacco: Never Used  . Tobacco comment: only social  Substance and Sexual Activity  . Alcohol use: Yes    Alcohol/week: 0.0 standard drinks    Comment: rare social   . Drug use: No  . Sexual activity: Yes  Other Topics Concern  . Not on file  Social History Narrative  . Not on file   Social Determinants of Health   Financial Resource Strain:   . Difficulty of Paying Living Expenses: Not on file  Food Insecurity:   . Worried About Charity fundraiser in the Last Year: Not on file  . Ran Out of Food in the Last Year: Not on file  Transportation Needs:   . Lack of Transportation (Medical): Not on file  . Lack of Transportation (Non-Medical): Not on file  Physical Activity:   . Days of Exercise per Week: Not on file  . Minutes of Exercise per Session: Not on file  Stress:   . Feeling of Stress : Not on file  Social Connections:   . Frequency of Communication with Friends and Family: Not on file  . Frequency of Social Gatherings with Friends and Family: Not on file  . Attends Religious Services: Not on file  . Active Member of Clubs or Organizations: Not on file  . Attends Archivist Meetings: Not on file  . Marital Status: Not on file    Allergies:  Allergies  Allergen Reactions  . Bactrim [Sulfamethoxazole-Trimethoprim] Nausea And Vomiting  . Morphine Nausea And Vomiting  . Morphine And Related Nausea And Vomiting  . Ceclor [Cefaclor] Nausea And Vomiting    Can take Augmentin ok  . Clarithromycin Nausea Only    REACTION: nausea  . Codeine Nausea And Vomiting  . Cymbalta [Duloxetine Hcl]   . Erythromycin Nausea And Vomiting  . Levofloxacin  Nausea Only    REACTION: nausea  . Loratadine     REACTION: bruises  . Nitrofurantoin Nausea Only  . Propoxyphene Nausea Only    dizzy  . Propoxyphene N-Acetaminophen Nausea Only    dizzy  . Zithromax [Azithromycin] Nausea And Vomiting    Metabolic Disorder Labs: Lab Results  Component Value Date   HGBA1C 5.4 02/11/2018   No results found for: PROLACTIN Lab Results  Component Value Date   CHOL 263 (H) 09/16/2019   TRIG 64.0 09/16/2019   HDL 112.20 09/16/2019  CHOLHDL 2 09/16/2019   VLDL 12.8 09/16/2019   LDLCALC 138 (H) 09/16/2019   LDLCALC 110 (H) 04/11/2019   Lab Results  Component Value Date   TSH 1.58 02/11/2018   TSH 2.52 05/04/2015    Therapeutic Level Labs: No results found for: LITHIUM No results found for: VALPROATE No components found for:  CBMZ  Current Medications: Current Outpatient Medications  Medication Sig Dispense Refill  . aspirin 81 MG tablet Take 81 mg by mouth daily.    . Calcium Carb-Cholecalciferol (CALCIUM 600+D3 PO) Take 1,200 tablets by mouth daily.     . cholecalciferol (VITAMIN D3) 25 MCG (1000 UT) tablet Take 4,000 Units by mouth daily.    . Evolocumab (REPATHA SURECLICK) 710 MG/ML SOAJ Inject 1 pen into the skin every 14 (fourteen) days. 2 pen 11  . gabapentin (NEURONTIN) 100 MG capsule Take 100 mg by mouth 3 (three) times daily as needed (nerve pain).    . meloxicam (MOBIC) 15 MG tablet Take 15 mg by mouth daily.    . methocarbamol (ROBAXIN) 500 MG tablet Take 1 tablet (500 mg total) by mouth 4 (four) times daily. 60 tablet 1  . omega-3 acid ethyl esters (LOVAZA) 1 g capsule Take 2 capsules (2 g total) by mouth 2 (two) times daily. 120 capsule 11  . rosuvastatin (CRESTOR) 5 MG tablet Take 1 tablet (5 mg total) by mouth every Monday, Wednesday, and Friday. 39 tablet 3  . traMADol (ULTRAM) 50 MG tablet Take 0.5-1 tablets (25-50 mg total) by mouth every 8 (eight) hours as needed for severe pain (back). 90 tablet 1  . triamcinolone  ointment (KENALOG) 0.5 % Apply 1 application topically 2 (two) times daily. 120 g 3  . Vitamin D, Ergocalciferol, (DRISDOL) 1.25 MG (50000 UNIT) CAPS capsule Take 1 capsule (50,000 Units total) by mouth every 7 (seven) days. 12 capsule 0  . vitamin E 400 UNIT capsule Take 800 Units by mouth daily.     [name] VYVANSE 50 MG capsule Take 50 mg by mouth daily.    . methylphenidate (CONCERTA) 54 MG PO CR tablet Take 1 tablet (54 mg total) by mouth every morning. 30 tablet 0  . nitroGLYCERIN (NITROSTAT) 0.4 MG SL tablet Place 1 tablet (0.4 mg total) under the tongue every 5 (five) minutes as needed for chest pain. 25 tablet 2  . Vilazodone HCl (VIIBRYD) 20 MG TABS Take 1 tablet (20 mg total) by mouth daily at 12 noon. 30 tablet 1   No current facility-administered medications for this visit.    Medication Side Effects: dry mouth  Orders placed this visit:  No orders of the defined types were placed in this encounter.   Psychiatric Specialty Exam:  Review of Systems  Constitutional: Negative for diaphoresis, fatigue and unexpected weight change.  HENT: Negative for congestion, ear pain, hearing loss, sinus pressure, tinnitus and trouble swallowing.   Eyes: Negative for visual disturbance.  Respiratory: Negative for cough, chest tightness and shortness of breath.   Cardiovascular: Negative for chest pain and palpitations.  Gastrointestinal: Negative for constipation, diarrhea, nausea and vomiting.  Endocrine: Negative for polyuria.  Genitourinary: Negative for decreased urine volume, difficulty urinating, dysuria, frequency and pelvic pain.  Musculoskeletal: Negative for arthralgias, gait problem and myalgias.  Skin: Negative for rash.  Allergic/Immunologic: Negative for environmental allergies.  Neurological: Negative for dizziness, tremors, speech difficulty, weakness, numbness and headaches.    Last menstrual period 01/08/1999.There is no height or weight on file to calculate BMI.  General  Appearance: Casual  Eye Contact:  [name]  Speech:  Clear and Coherent, Normal Rate and Talkative  Volume:  Normal  Mood:  Anxious  Affect:  Full Range and Anxious  Thought Process:  Coherent, Goal Directed and Descriptions of Associations: Intact  Orientation:  Full (Time, Place, and Person)  Thought Content: Logical and Hallucinations: None   Suicidal Thoughts:  No  Homicidal Thoughts:  No  Memory:  WNL  Judgement:  [name]  Insight:  [name]  Psychomotor Activity:  Normal and Increased  Concentration:  Concentration: [name]  Recall:  [name]  Fund of Knowledge: [name]  Language: [name]  Assets:  Communication Skills Desire for Improvement Financial Resources/Insurance Housing Social Support Talents/Skills Transportation Vocational/Educational  ADL's:  Intact  Cognition: WNL  Prognosis:  [name]   Screenings:  PHQ2-9     Office Visit from 01/05/2015 in [facility]  PHQ-2 Total Score 0      Receiving Psychotherapy: History Fred [name], [facility]  Treatment Plan/Recommendations:   Discussed in detail her experience with psychiatric meds which have not gone well from her perspective.  Discussed her concerns about medication sensitivity.  Answered questions about genetics and med response in detail. Consider Genesight and discussed in detail.  Viibryd would be safe per [employer]  10-20 mg daily.  Disc SE.  For better anxiety management avoid BZ due to stimulants and rec trial antidepressant for anxiety.  Given genetic concerns rec trial Viibryd.  Discussed how generally considered undesirable medical practice to use a combination of benzodiazepines plus stimulants to manage anxiety.  Option change Vyvanse to MPH ER to try to get less dry mouth and anxiety. Concerta 54 mg AM.  In general methylphenidate products tend to cause less anxiety than due to amphetamine-based products for ADHD.  This was discussed in some detail. Discussed potential benefits, risks, and side  effects of stimulants with patient to include increased heart rate, palpitations, insomnia, increased anxiety, increased irritability, or decreased appetite.  Instructed patient to contact office if experiencing any significant tolerability issues.  She agres with the plan  FU 6-8 weeks  Purnell Shoemaker, MD

## 2020-03-12 ENCOUNTER — Telehealth: Payer: Self-pay

## 2020-03-12 NOTE — Telephone Encounter (Signed)
Prior authorization submitted and approved for METHYLPHENIDATE ER 54 MG effective 02/05/2020-03/06/2021 with FEP/BCBS [employer] ID# [id number]

## 2020-03-26 ENCOUNTER — Telehealth: Payer: Self-pay

## 2020-03-26 NOTE — Telephone Encounter (Signed)
Called and lmom pt to see if they are still taking the repatha because ldl at last lipid draw was still 138 and we need new lipids drawn to even send a pa. Will await pt call back

## 2020-03-28 DIAGNOSIS — M1712 Unilateral primary osteoarthritis, left knee: Secondary | ICD-10-CM | POA: Diagnosis not present

## 2020-03-29 DIAGNOSIS — Z124 Encounter for screening for malignant neoplasm of cervix: Secondary | ICD-10-CM | POA: Diagnosis not present

## 2020-03-29 DIAGNOSIS — M8580 Other specified disorders of bone density and structure, unspecified site: Secondary | ICD-10-CM | POA: Insufficient documentation

## 2020-03-29 DIAGNOSIS — M48062 Spinal stenosis, lumbar region with neurogenic claudication: Secondary | ICD-10-CM | POA: Diagnosis not present

## 2020-03-29 DIAGNOSIS — R309 Painful micturition, unspecified: Secondary | ICD-10-CM | POA: Diagnosis not present

## 2020-03-29 DIAGNOSIS — Z6822 Body mass index (BMI) 22.0-22.9, adult: Secondary | ICD-10-CM | POA: Diagnosis not present

## 2020-03-29 DIAGNOSIS — M160 Bilateral primary osteoarthritis of hip: Secondary | ICD-10-CM | POA: Diagnosis not present

## 2020-03-29 DIAGNOSIS — Z01419 Encounter for gynecological examination (general) (routine) without abnormal findings: Secondary | ICD-10-CM | POA: Diagnosis not present

## 2020-03-29 DIAGNOSIS — Z1231 Encounter for screening mammogram for malignant neoplasm of breast: Secondary | ICD-10-CM | POA: Diagnosis not present

## 2020-04-03 DIAGNOSIS — M1712 Unilateral primary osteoarthritis, left knee: Secondary | ICD-10-CM | POA: Diagnosis not present

## 2020-04-09 ENCOUNTER — Encounter: Payer: Self-pay | Admitting: Psychiatry

## 2020-04-10 ENCOUNTER — Other Ambulatory Visit: Payer: Self-pay

## 2020-04-10 ENCOUNTER — Encounter (INDEPENDENT_AMBULATORY_CARE_PROVIDER_SITE_OTHER): Payer: Self-pay | Admitting: Internal Medicine

## 2020-04-10 ENCOUNTER — Ambulatory Visit (INDEPENDENT_AMBULATORY_CARE_PROVIDER_SITE_OTHER): Payer: Medicare Other | Admitting: Internal Medicine

## 2020-04-10 VITALS — BP 132/82 | HR 78 | Temp 97.3°F | Ht <= 58 in | Wt 114.4 lb

## 2020-04-10 DIAGNOSIS — E559 Vitamin D deficiency, unspecified: Secondary | ICD-10-CM | POA: Diagnosis not present

## 2020-04-10 DIAGNOSIS — I2510 Atherosclerotic heart disease of native coronary artery without angina pectoris: Secondary | ICD-10-CM | POA: Diagnosis not present

## 2020-04-10 DIAGNOSIS — M8580 Other specified disorders of bone density and structure, unspecified site: Secondary | ICD-10-CM | POA: Diagnosis not present

## 2020-04-10 DIAGNOSIS — E782 Mixed hyperlipidemia: Secondary | ICD-10-CM | POA: Diagnosis not present

## 2020-04-10 DIAGNOSIS — F520 Hypoactive sexual desire disorder: Secondary | ICD-10-CM

## 2020-04-10 DIAGNOSIS — F324 Major depressive disorder, single episode, in partial remission: Secondary | ICD-10-CM

## 2020-04-10 DIAGNOSIS — E785 Hyperlipidemia, unspecified: Secondary | ICD-10-CM

## 2020-04-10 DIAGNOSIS — M160 Bilateral primary osteoarthritis of hip: Secondary | ICD-10-CM | POA: Diagnosis not present

## 2020-04-10 NOTE — Progress Notes (Signed)
Metrics: Intervention Frequency ACO  Documented Smoking Status Yearly  Screened one or more times in 24 months  Cessation Counseling or  Active cessation medication Past 24 months  Past 24 months   Guideline developer: UpToDate (See UpToDate for funding source) Date Released: 2014       Wellness Office Visit  Subjective:  Patient ID: Courtney [name], female    DOB: [birth year]  Age: 69 y.o. MRN: [id number]  CC: This 69 year old lady comes to our practice as a new patient, referred by her OB/GYN, Dr. Vernice Jefferson. HPI She is concerned about multiple joint pains, degree of fatigue, sense of wellbeing.  She would like another opinion regarding her overall health.  She has hypercholesterolemia and was recommended statin therapy.  She has no history of coronary artery disease or cerebrovascular disease. She also is on medication for ADD. She has vitamin D deficiency and takes vitamin D3 supplementation and her levels in May were in a good level above 90. On closer questioning, she describes virtually no libido.  She says she would like to have some.  She also describes vagina dryness. In the past, she apparently was started on thyroid medication of some sort and felt better.  Past Medical History:  Diagnosis Date  . ADD (attention deficit disorder)   . Anxiety   . Celiac disease    possible vs IBS  . Depression    no bipolar per Dr. [name]  . GI problem    [name]  . Hyperlipidemia   . IBS (irritable bowel syndrome)   . Osteoarthritis   . Osteoporosis   . Seasonal allergies 01-08-12   hx. of multiple bronchitis related to this.  . Shoulder pain, right    Past Surgical History:  Procedure Laterality Date  . EXPLORATORY LAPAROTOMY  01-08-12   due to endometriosis-portions of both ovaries removed  . TONSILLECTOMY    . TOTAL HIP ARTHROPLASTY     right. 2002  . TOTAL HIP ARTHROPLASTY  01/14/2012   Procedure: TOTAL HIP ARTHROPLASTY;  Surgeon: Gearlean Alf, MD;   Location: [facility];  Service: Orthopedics;  Laterality: Left;  [name] VENTRAL HERNIA REPAIR  01-08-12   abdominal     Family History  Problem Relation Age of Onset  . Heart disease Mother   . Heart attack Mother   . Prostate cancer Father   . Coronary artery disease Other        FH Female 1st degree relative <60  . ADD / ADHD Other     Social History   Social History Narrative   Married for last 1 years.Lives with husband.Retired Insurance account manager.Originally from [place].   Social History   Tobacco Use  . Smoking status: Never Smoker  . Smokeless tobacco: Never Used  . Tobacco comment: only social  Substance Use Topics  . Alcohol use: Yes    Alcohol/week: 0.0 standard drinks    Comment: rare social     Current Meds  Medication Sig  . ALPRAZolam (XANAX) 1 MG tablet Take 0.5-1 mg by mouth daily as needed.  [name] amoxicillin (AMOXIL) 500 MG tablet Take 1,000 mg by mouth 2 (two) times daily. Dental procedures only  . Calcium Carb-Cholecalciferol (CALCIUM 600+D3 PO) Take 1,200 tablets by mouth daily.   . cholecalciferol (VITAMIN D3) 25 MCG (1000 UT) tablet Take 4,000 Units by mouth daily.  [name] gabapentin (NEURONTIN) 100 MG capsule Take 100 mg by mouth 3 (three) times daily as needed (nerve pain).  . meloxicam (MOBIC) 15  MG tablet Take 15 mg by mouth daily.  . methocarbamol (ROBAXIN) 500 MG tablet Take 1 tablet (500 mg total) by mouth 4 (four) times daily.  . methylphenidate (CONCERTA) 54 MG PO CR tablet Take 1 tablet (54 mg total) by mouth every morning.  . nitroGLYCERIN (NITROSTAT) 0.4 MG SL tablet Place 1 tablet (0.4 mg total) under the tongue every 5 (five) minutes as needed for chest pain.  [name] omega-3 acid ethyl esters (LOVAZA) 1 g capsule Take 2 capsules (2 g total) by mouth 2 (two) times daily.  . penicillin v potassium (VEETID) 500 MG tablet Take 500 mg by mouth 4 (four) times daily.  . traMADol (ULTRAM) 50 MG tablet Take 0.5-1 tablets (25-50 mg total) by mouth every 8  (eight) hours as needed for severe pain (back).  . triamcinolone ointment (KENALOG) 0.5 % Apply 1 application topically 2 (two) times daily.  . Vilazodone HCl (VIIBRYD) 20 MG TABS Take 1 tablet (20 mg total) by mouth daily at 12 noon.  . vitamin E 400 UNIT capsule Take 800 Units by mouth daily.       Depression screen PHQ 2/9 01/05/2015  Decreased Interest 0  Down, Depressed, Hopeless 0  PHQ - 2 Score 0     Objective:   Today's Vitals: BP 132/82   Pulse 78   Temp (!) 97.3 F (36.3 C) (Temporal)   Ht 4\' 10"  (1.473 m)   Wt 114 lb 6.4 oz (51.9 kg)   LMP 01/08/1999   SpO2 97%   BMI 23.91 kg/m  Vitals with BMI 04/10/2020 03/01/2020 06/22/2019  Height 4\' 10"  4\' 11"  4\' 11"   Weight 114 lbs 6 oz 114 lbs 118 lbs 10 oz  BMI 23.92 16.10 96.04  Systolic 540 981 191  Diastolic 82 82 72  Pulse 78 89 85     Physical Exam  She looks good for age.  Healthy weight.  Good blood pressure.     Assessment   1. Dyslipidemia   2. Vitamin D deficiency   3. Major depressive disorder in partial remission, unspecified whether recurrent ([place])   4. CAD in native artery   5. Primary osteoarthritis of both hips   6. Osteopenia, unspecified location   7. Hypoactive sexual desire disorder   8. Mixed hyperlipidemia       Tests ordered Orders Placed This Encounter  Procedures  . Lipid panel  . T3, free  . T4, free  . TSH  . Progesterone  . Estradiol  . Testos,Total,Free and SHBG (Female)  . COMPLETE METABOLIC PANEL WITH GFR  . DHEA-sulfate     Plan: 1. Blood work as ordered above. 2. I will review all the blood test with her and make further recommendations.  I explained the philosophy of the practice based on nutrition, exercise and bioidentical hormone therapy. 3. I spent 35 to 40 minutes with this patient evaluating her symptoms and reviewing her medical records from the past.   No orders of the defined types were placed in this encounter.   Doree Albee, MD

## 2020-04-11 DIAGNOSIS — M1712 Unilateral primary osteoarthritis, left knee: Secondary | ICD-10-CM | POA: Diagnosis not present

## 2020-04-13 LAB — COMPLETE METABOLIC PANEL WITH GFR
AG Ratio: 1.8 (calc) (ref 1.0–2.5)
ALT: 11 U/L (ref 6–29)
AST: 17 U/L (ref 10–35)
Albumin: 4.1 g/dL (ref 3.6–5.1)
Alkaline phosphatase (APISO): 54 U/L (ref 37–153)
BUN: 19 mg/dL (ref 7–25)
CO2: 30 mmol/L (ref 20–32)
Calcium: 9.6 mg/dL (ref 8.6–10.4)
Chloride: 98 mmol/L (ref 98–110)
Creat: 0.76 mg/dL (ref 0.60–0.93)
GFR, Est African American: 91 mL/min/{1.73_m2} (ref >=60)
GFR, Est Non African American: 79 mL/min/{1.73_m2} (ref >=60)
Globulin: 2.3 g/dL (calc) (ref 1.9–3.7)
Glucose, Bld: 95 mg/dL (ref 65–99)
Potassium: 4.4 mmol/L (ref 3.5–5.3)
Sodium: 135 mmol/L (ref 135–146)
Total Bilirubin: 0.3 mg/dL (ref 0.2–1.2)
Total Protein: 6.4 g/dL (ref 6.1–8.1)

## 2020-04-13 LAB — PROGESTERONE: Progesterone: <0.5 ng/mL

## 2020-04-13 LAB — TESTOS,TOTAL,FREE AND SHBG (FEMALE)
Free Testosterone: 0.4 pg/mL (ref 0.2–3.7)
Sex Hormone Binding: 109 nmol/L — ABNORMAL HIGH (ref 14–73)
Testosterone, Total, LC-MS-MS: 7 ng/dL (ref 2–45)

## 2020-04-13 LAB — LIPID PANEL
Cholesterol: 235 mg/dL — ABNORMAL HIGH (ref <200)
HDL: 85 mg/dL (ref >=50)
LDL Cholesterol (Calc): 118 mg/dL (calc) — ABNORMAL HIGH
Non-HDL Cholesterol (Calc): 150 mg/dL (calc) — ABNORMAL HIGH (ref <130)
Total CHOL/HDL Ratio: 2.8 (calc) (ref <5.0)
Triglycerides: 199 mg/dL — ABNORMAL HIGH (ref <150)

## 2020-04-13 LAB — TSH: TSH: 2.29 mIU/L (ref 0.40–4.50)

## 2020-04-13 LAB — T4, FREE: Free T4: 1 ng/dL (ref 0.8–1.8)

## 2020-04-13 LAB — DHEA-SULFATE: DHEA-SO4: 34 ug/dL (ref 7–177)

## 2020-04-13 LAB — ESTRADIOL: Estradiol: <15 pg/mL

## 2020-04-13 LAB — T3, FREE: T3, Free: 3.3 pg/mL (ref 2.3–4.2)

## 2020-04-30 DIAGNOSIS — M5416 Radiculopathy, lumbar region: Secondary | ICD-10-CM | POA: Diagnosis not present

## 2020-04-30 DIAGNOSIS — M48062 Spinal stenosis, lumbar region with neurogenic claudication: Secondary | ICD-10-CM | POA: Diagnosis not present

## 2020-05-02 ENCOUNTER — Encounter: Payer: Self-pay | Admitting: Psychiatry

## 2020-05-02 ENCOUNTER — Ambulatory Visit (INDEPENDENT_AMBULATORY_CARE_PROVIDER_SITE_OTHER): Payer: Medicare Other | Admitting: Psychiatry

## 2020-05-02 ENCOUNTER — Other Ambulatory Visit: Payer: Self-pay

## 2020-05-02 ENCOUNTER — Other Ambulatory Visit: Payer: Self-pay | Admitting: Psychiatry

## 2020-05-02 DIAGNOSIS — D508 Other iron deficiency anemias: Secondary | ICD-10-CM

## 2020-05-02 DIAGNOSIS — R202 Paresthesia of skin: Secondary | ICD-10-CM

## 2020-05-02 DIAGNOSIS — F902 Attention-deficit hyperactivity disorder, combined type: Secondary | ICD-10-CM | POA: Diagnosis not present

## 2020-05-02 DIAGNOSIS — F411 Generalized anxiety disorder: Secondary | ICD-10-CM | POA: Diagnosis not present

## 2020-05-02 MED ORDER — VIIBRYD 20 MG PO TABS: 1.0000 | ORAL_TABLET | Freq: Every day | ORAL | 0 refills | Status: DC

## 2020-05-02 MED ORDER — VIIBRYD 20 MG PO TABS: 1.0000 | ORAL_TABLET | Freq: Every day | ORAL | 1 refills | Status: DC

## 2020-05-02 MED ORDER — METHYLPHENIDATE HCL ER (OSM) 54 MG PO TBCR: 54.0000 mg | EXTENDED_RELEASE_TABLET | ORAL | 0 refills | Status: DC

## 2020-05-02 MED ORDER — VIIBRYD 20 MG PO TABS
1.0000 | ORAL_TABLET | Freq: Every day | ORAL | 1 refills | Status: DC
Start: 2020-05-02 — End: 2020-07-03

## 2020-05-02 NOTE — Patient Instructions (Addendum)
Disc the off-label use of N-Acetylcysteine at 600 mg daily to help with mild cognitive problems.  It can be combined with a B-complex vitamin as the B-12 and folate have been shown to sometimes enhance the effect.    Dialectical Behavior Therapy at [facility]   Call by end of January if there is no benefit and we'll increase the dose of Viibryd.

## 2020-05-02 NOTE — Progress Notes (Signed)
Chassidy [name] [id number] 1951-04-01 70 y.o.  Subjective:   Patient ID:  Courtney [name] is a 70 y.o. (DOB 08/10/1950) female.  Chief Complaint:  Chief Complaint  Patient presents with  . Follow-up  . Anxiety  . ADHD  . medication changes    HPI Courtney [name] presents to the office today for follow-up of first visit 03/01/2020 referred by a friend [name].  Was prescribed Viibryd and Vyvanse was changed to Concerta 54 mg to try to reduce the amount of dry mouth.  05/02/2020 appointment with the following noted: Less dry mouth with Concerta but didn't seem to last beyond 3-4 PM. Viibryd 20 mg daily.  Hard to get enough calories at breakfast.  But has been taking both in the morning.  Some benefit with Viibryd.  Is there something I can take besides a stimulant, bc fears it is stressing her body.  Dx college exhausted adrenal glands.  It helps.  Asked questions about alternatives to stimulants.  Reduce Concerta to reduce anxiety and see if can achieve, but she thinks it's worse in the evening.  Anxiety is still a struggle.  Life crisis moment.  Chronic marital dissatisfaction worse now that both are retired.  Feels like she's in a prison.  Family notices she's stressed.  Has done therapy since age 60 yo.  Has done Hindu meditation without help.  Christian faith disciplines.  Grew up caretaking.  I'm done and I need to change.  Goal is calm down enough to function through the situation.  Tingling foot and wants B12 testing.  Past Psychiatric Medication Trials: Trintellix NR, sertraline?,  Vyvanse, Adderall History Genesight  Review of Systems:  Review of Systems  Neurological: Negative for tremors and weakness.  Psychiatric/Behavioral: Positive for decreased concentration and dysphoric mood. The patient is nervous/anxious.     Medications: I have reviewed the patient's current medications.  Current Outpatient Medications  Medication Sig Dispense Refill  .  ALPRAZolam (XANAX) 1 MG tablet Take 0.5-1 mg by mouth daily as needed.    [name] amoxicillin (AMOXIL) 500 MG tablet Take 1,000 mg by mouth 2 (two) times daily. Dental procedures only    . Calcium Carb-Cholecalciferol (CALCIUM 600+D3 PO) Take 1,200 tablets by mouth daily.     . cholecalciferol (VITAMIN D3) 25 MCG (1000 UT) tablet Take 4,000 Units by mouth daily.    [name] gabapentin (NEURONTIN) 100 MG capsule Take 100 mg by mouth 3 (three) times daily as needed (nerve pain).    . meloxicam (MOBIC) 15 MG tablet Take 15 mg by mouth daily.    . methocarbamol (ROBAXIN) 500 MG tablet Take 1 tablet (500 mg total) by mouth 4 (four) times daily. 60 tablet 1  . omega-3 acid ethyl esters (LOVAZA) 1 g capsule Take 2 capsules (2 g total) by mouth 2 (two) times daily. 120 capsule 11  . penicillin v potassium (VEETID) 500 MG tablet Take 500 mg by mouth 4 (four) times daily.    . traMADol (ULTRAM) 50 MG tablet Take 0.5-1 tablets (25-50 mg total) by mouth every 8 (eight) hours as needed for severe pain (back). 90 tablet 1  . triamcinolone ointment (KENALOG) 0.5 % Apply 1 application topically 2 (two) times daily. 120 g 3  . vitamin E 400 UNIT capsule Take 800 Units by mouth daily.     . methylphenidate (CONCERTA) 54 MG PO CR tablet Take 1 tablet (54 mg total) by mouth every morning. 30 tablet 0  . nitroGLYCERIN (NITROSTAT) 0.4 MG SL tablet Place  1 tablet (0.4 mg total) under the tongue every 5 (five) minutes as needed for chest pain. 25 tablet 2  . Vilazodone HCl (VIIBRYD) 20 MG TABS Take 1 tablet (20 mg total) by mouth daily at 12 noon. 30 tablet 0   No current facility-administered medications for this visit.    Medication Side Effects: Other: dry mouth  Allergies:  Allergies  Allergen Reactions  . Clarithromycin Nausea Only    REACTION: nausea  . Bactrim [Sulfamethoxazole-Trimethoprim] Nausea And Vomiting  . Morphine Nausea And Vomiting  . Morphine And Related Nausea And Vomiting  . Ceclor [Cefaclor] Nausea And  Vomiting    Can take Augmentin ok  . Codeine Nausea And Vomiting  . Cymbalta [Duloxetine Hcl]   . Erythromycin Nausea And Vomiting  . Levofloxacin Nausea Only    REACTION: nausea  . Loratadine     REACTION: bruises  . Nitrofurantoin Nausea Only  . Propoxyphene Nausea Only    dizzy  . Propoxyphene N-Acetaminophen Nausea Only    dizzy  . Zithromax [Azithromycin] Nausea And Vomiting    Past Medical History:  Diagnosis Date  . ADD (attention deficit disorder)   . Anxiety   . Celiac disease    possible vs IBS  . Depression    no bipolar per Dr. Nolen Mu  . GI problem    [name]  . Hyperlipidemia   . IBS (irritable bowel syndrome)   . Osteoarthritis   . Osteoporosis   . Seasonal allergies 01-08-12   hx. of multiple bronchitis related to this.  . Shoulder pain, right     Family History  Problem Relation Age of Onset  . Heart disease Mother   . Heart attack Mother   . Prostate cancer Father   . Coronary artery disease Other        FH Female 1st degree relative <60  . ADD / ADHD Other     Social History   Socioeconomic History  . Marital status: Married    Spouse name: Not on file  . Number of children: Not on file  . Years of education: Not on file  . Highest education level: Not on file  Occupational History  . Occupation: Network engineer  Tobacco Use  . Smoking status: Never Smoker  . Smokeless tobacco: Never Used  . Tobacco comment: only social  Vaping Use  . Vaping Use: Never used  Substance and Sexual Activity  . Alcohol use: Yes    Alcohol/week: 0.0 standard drinks    Comment: rare social   . Drug use: No  . Sexual activity: Yes  Other Topics Concern  . Not on file  Social History Narrative   Married for last 42 years.Lives with husband.Retired Human resources officer.Originally from [place].   Social Determinants of Health   Financial Resource Strain: Not on file  Food Insecurity: Not on file  Transportation Needs: Not on file   Physical Activity: Not on file  Stress: Not on file  Social Connections: Not on file  Intimate Partner Violence: Not on file    Past Medical History, Surgical history, Social history, and Family history were reviewed and updated as appropriate.   Please see review of systems for further details on the patient's review from today.   Objective:   Physical Exam:  LMP 01/08/1999   Physical Exam Constitutional:      General: She is not in acute distress. Musculoskeletal:        General: No deformity.  Neurological:  Mental Status: She is alert and oriented to person, place, and time.     Coordination: Coordination normal.  Psychiatric:        Attention and Perception: Attention and perception normal. She does not perceive auditory or visual hallucinations.        Mood and Affect: Mood is anxious and depressed. Affect is not labile, blunt, angry or inappropriate.        Speech: Speech normal.        Behavior: Behavior is hyperactive.        Thought Content: Thought content normal. Thought content is not paranoid or delusional. Thought content does not include homicidal or suicidal ideation. Thought content does not include homicidal or suicidal plan.        Cognition and Memory: Cognition and memory normal.        Judgment: Judgment normal.     Comments: Insight intact Hyperactive style Angry     Lab Review:     Component Value Date/Time   NA 135 04/10/2020 1431   NA 134 06/14/2018 1444   K 4.4 04/10/2020 1431   CL 98 04/10/2020 1431   CO2 30 04/10/2020 1431   GLUCOSE 95 04/10/2020 1431   BUN 19 04/10/2020 1431   BUN 10 06/14/2018 1444   CREATININE 0.76 04/10/2020 1431   CALCIUM 9.6 04/10/2020 1431   PROT 6.4 04/10/2020 1431   PROT 6.6 10/18/2018 1022   ALBUMIN 4.5 09/16/2019 1200   ALBUMIN 4.3 10/18/2018 1022   AST 17 04/10/2020 1431   ALT 11 04/10/2020 1431   ALKPHOS 57 09/16/2019 1200   BILITOT 0.3 04/10/2020 1431   BILITOT 0.4 10/18/2018 1022   GFRNONAA  79 04/10/2020 1431   GFRAA 91 04/10/2020 1431       Component Value Date/Time   WBC 5.0 06/14/2018 0005   RBC 3.76 (L) 06/14/2018 0005   HGB 12.1 06/14/2018 0005   HCT 35.6 (L) 06/14/2018 0005   PLT 258 06/14/2018 0005   MCV 94.7 06/14/2018 0005   MCH 32.2 06/14/2018 0005   MCHC 34.0 06/14/2018 0005   RDW 11.9 06/14/2018 0005   LYMPHSABS 2.0 06/14/2018 0005   MONOABS 0.7 06/14/2018 0005   EOSABS 0.2 06/14/2018 0005   BASOSABS 0.0 06/14/2018 0005    No results found for: POCLITH, LITHIUM   No results found for: PHENYTOIN, PHENOBARB, VALPROATE, CBMZ   .res Assessment: Plan:    Shastina was seen today for follow-up, anxiety, adhd and medication changes.  Diagnoses and all orders for this visit:  Generalized anxiety disorder -     Vilazodone HCl (VIIBRYD) 20 MG TABS; Take 1 tablet (20 mg total) by mouth daily at 12 noon.  Attention deficit hyperactivity disorder (ADHD), combined type -     methylphenidate (CONCERTA) 54 MG PO CR tablet; Take 1 tablet (54 mg total) by mouth every morning.  Other iron deficiency anemia -     Vitamin B12  Paresthesias -     Vitamin B12  Switch Viibryd to 20 mg in evening meal to get better absorption.  Disc the off-label use of N-Acetylcysteine at 600 mg daily to help with mild cognitive problems.  It can be combined with a B-complex vitamin as the B-12 and folate have been shown to sometimes enhance the effect.  She wants B12 level checked.  She doesn't want to reduce Concerta.  Disc her ambivalence at length about it.  . Dialectical Behavior Therapy at [facility]   Call by end of  January if there is no benefit and we'll increase the dose of Viibryd.  Supportive therapy around marital crisis.  Reviewed Genesight testing  FU 6 weeks  Lynder Parents, MD, DFAPA   Please see After Visit Summary for patient specific instructions.  Future Appointments  Date Time Provider [place]  05/08/2020 11:45 AM Doree Albee, MD [place] None    Orders Placed This Encounter  Procedures  . Vitamin B12    -------------------------------

## 2020-05-08 ENCOUNTER — Encounter (INDEPENDENT_AMBULATORY_CARE_PROVIDER_SITE_OTHER): Payer: Self-pay | Admitting: Internal Medicine

## 2020-05-08 ENCOUNTER — Other Ambulatory Visit: Payer: Self-pay

## 2020-05-08 ENCOUNTER — Ambulatory Visit (INDEPENDENT_AMBULATORY_CARE_PROVIDER_SITE_OTHER): Payer: Medicare Other | Admitting: Internal Medicine

## 2020-05-08 VITALS — BP 148/80 | HR 94 | Temp 97.5°F | Ht <= 58 in | Wt 113.2 lb

## 2020-05-08 DIAGNOSIS — E559 Vitamin D deficiency, unspecified: Secondary | ICD-10-CM

## 2020-05-08 DIAGNOSIS — R5381 Other malaise: Secondary | ICD-10-CM | POA: Diagnosis not present

## 2020-05-08 DIAGNOSIS — E785 Hyperlipidemia, unspecified: Secondary | ICD-10-CM

## 2020-05-08 DIAGNOSIS — R5383 Other fatigue: Secondary | ICD-10-CM

## 2020-05-08 DIAGNOSIS — F520 Hypoactive sexual desire disorder: Secondary | ICD-10-CM | POA: Diagnosis not present

## 2020-05-08 DIAGNOSIS — F324 Major depressive disorder, single episode, in partial remission: Secondary | ICD-10-CM | POA: Diagnosis not present

## 2020-05-08 MED ORDER — TESTOSTERONE 1.62 % TD GEL: 5.0000 mg | Freq: Every day | TRANSDERMAL | 1 refills | Status: DC

## 2020-05-08 MED ORDER — ESTRADIOL 1 MG PO TABS: 1.0000 mg | ORAL_TABLET | Freq: Every day | ORAL | 3 refills | Status: DC

## 2020-05-08 MED ORDER — NP THYROID 60 MG PO TABS: 60.0000 mg | ORAL_TABLET | Freq: Every day | ORAL | 3 refills | Status: DC

## 2020-05-08 MED ORDER — PROGESTERONE 200 MG PO CAPS: 200.0000 mg | ORAL_CAPSULE | Freq: Every day | ORAL | 3 refills | Status: DC

## 2020-05-08 NOTE — Progress Notes (Signed)
Metrics: Intervention Frequency ACO  Documented Smoking Status Yearly  Screened one or more times in 24 months  Cessation Counseling or  Active cessation medication Past 24 months  Past 24 months   Guideline developer: UpToDate (See UpToDate for funding source) Date Released: 2014       Wellness Office Visit  Subjective:  Patient ID: Courtney [name], female    DOB: [birth year]  Age: 70 y.o. MRN: [id number]  CC: This lady comes in for follow-up and review of her blood work and further recommendations. HPI  I discussed all the results in detail.  She has nonexistent estradiol and progesterone levels and very suboptimal testosterone levels as would be expected in the woman in the menopause.  She has elevated cholesterol and triglycerides although I am not sure that sample was fasting. She has free T3 levels which are in the normal range but she does have symptoms of thyroid hypofunction.  Past Medical History:  Diagnosis Date  . ADD (attention deficit disorder)   . Anxiety   . Celiac disease    possible vs IBS  . Depression    no bipolar per Dr. [name]  . GI problem    [name]  . Hyperlipidemia   . IBS (irritable bowel syndrome)   . Osteoarthritis   . Osteoporosis   . Seasonal allergies 01-08-12   hx. of multiple bronchitis related to this.  . Shoulder pain, right    Past Surgical History:  Procedure Laterality Date  . EXPLORATORY LAPAROTOMY  01-08-12   due to endometriosis-portions of both ovaries removed  . TONSILLECTOMY    . TOTAL HIP ARTHROPLASTY     right. 2002  . TOTAL HIP ARTHROPLASTY  01/14/2012   Procedure: TOTAL HIP ARTHROPLASTY;  Surgeon: Gearlean Alf, MD;  Location: [facility];  Service: Orthopedics;  Laterality: Left;  [name] VENTRAL HERNIA REPAIR  01-08-12   abdominal     Family History  Problem Relation Age of Onset  . Heart disease Mother   . Heart attack Mother   . Prostate cancer Father   . Coronary artery disease Other        FH Female 1st degree  relative <60  . ADD / ADHD Other     Social History   Social History Narrative   Married for last 67 years.Lives with husband.Retired Insurance account manager.Originally from [place].   Social History   Tobacco Use  . Smoking status: Never Smoker  . Smokeless tobacco: Never Used  . Tobacco comment: only social  Substance Use Topics  . Alcohol use: Yes    Alcohol/week: 0.0 standard drinks    Comment: rare social     Current Meds  Medication Sig  . ALPRAZolam (XANAX) 1 MG tablet Take 0.5-1 mg by mouth daily as needed.  [name] amoxicillin (AMOXIL) 500 MG tablet Take 1,000 mg by mouth 2 (two) times daily. Dental procedures only  . Calcium Carb-Cholecalciferol (CALCIUM 600+D3 PO) Take 1,200 tablets by mouth daily.   . cholecalciferol (VITAMIN D3) 25 MCG (1000 UT) tablet Take 4,000 Units by mouth daily.  [name] estradiol (ESTRACE) 1 MG tablet Take 1 tablet (1 mg total) by mouth daily.  . meloxicam (MOBIC) 15 MG tablet Take 15 mg by mouth daily.  . methocarbamol (ROBAXIN) 500 MG tablet Take 1 tablet (500 mg total) by mouth 4 (four) times daily.  . methylphenidate (CONCERTA) 54 MG PO CR tablet Take 1 tablet (54 mg total) by mouth every morning.  . NP THYROID 60 MG tablet Take  1 tablet (60 mg total) by mouth daily before breakfast.  . omega-3 acid ethyl esters (LOVAZA) 1 g capsule Take 2 capsules (2 g total) by mouth 2 (two) times daily.  . pregabalin (LYRICA) 75 MG capsule Take 75 mg by mouth 2 (two) times daily.  . progesterone (PROMETRIUM) 200 MG capsule Take 1 capsule (200 mg total) by mouth daily.  . Testosterone 1.62 % GEL Place 5 mg onto the skin daily.  . traMADol (ULTRAM) 50 MG tablet Take 0.5-1 tablets (25-50 mg total) by mouth every 8 (eight) hours as needed for severe pain (back).  . triamcinolone ointment (KENALOG) 0.5 % Apply 1 application topically 2 (two) times daily.  . Vilazodone HCl (VIIBRYD) 20 MG TABS Take 1 tablet (20 mg total) by mouth daily at 12 noon.  . vitamin E 400  UNIT capsule Take 800 Units by mouth daily.       Depression screen [facility] 2/9 05/08/2020 01/05/2015  Decreased Interest 0 0  Down, Depressed, Hopeless 0 0  PHQ - 2 Score 0 0  Altered sleeping 0 -  Tired, decreased energy 0 -  Change in appetite 0 -  Feeling bad or failure about yourself  0 -  Trouble concentrating 1 -  Moving slowly or fidgety/restless 0 -  Suicidal thoughts 0 -  PHQ-9 Score 1 -  Difficult doing work/chores Not difficult at all -     Objective:   Today's Vitals: BP (!) 148/80   Pulse 94   Temp (!) 97.5 F (36.4 C) (Temporal)   Ht 4\' 10"  (1.473 m)   Wt 113 lb 3.2 oz (51.3 kg)   LMP 01/08/1999   SpO2 96%   BMI 23.66 kg/m  Vitals with BMI 05/08/2020 04/10/2020 03/01/2020  Height 4\' 10"  4\' 10"  4\' 11"   Weight 113 lbs 3 oz 114 lbs 6 oz 114 lbs  BMI 23.67 86.76 19.50  Systolic 932 671 245  Diastolic 80 82 82  Pulse 94 78 89     Physical Exam  She look systemically well.  No new physical findings.     Assessment   1. Dyslipidemia   2. Hypoactive sexual desire disorder   3. Malaise and fatigue   4. Major depressive disorder in partial remission, unspecified whether recurrent ([place])   5. Vitamin D deficiency       Tests ordered No orders of the defined types were placed in this encounter.    Plan: 1. I discussed all the results in detail. 2. After full discussion and shared decision making, we have decided to engage with biochemical hormone therapy including estradiol, progesterone and testosterone.  I have explained possible side effects of all these hormones. 3. In terms of her thyroid, she does appear to have symptoms of thyroid hypofunction and we will try to optimize this further so I have started her on NP thyroid 60 mg daily.  I explained possible side effects and how to deal with them. 4. She will continue with vitamin D3 and her levels are optimal previously. 5. Follow-up in about 4 to 6 weeks time to see how she is doing.  Today I spent  over 50 minutes explaining all results and answering all her questions and discussing possible side effects of these hormones and the benefits in particular.   Meds ordered this encounter  Medications  . NP THYROID 60 MG tablet    Sig: Take 1 tablet (60 mg total) by mouth daily before breakfast.    Dispense:  30  tablet    Refill:  3  . estradiol (ESTRACE) 1 MG tablet    Sig: Take 1 tablet (1 mg total) by mouth daily.    Dispense:  30 tablet    Refill:  3  . progesterone (PROMETRIUM) 200 MG capsule    Sig: Take 1 capsule (200 mg total) by mouth daily.    Dispense:  30 capsule    Refill:  3  . Testosterone 1.62 % GEL    Sig: Place 5 mg onto the skin daily.    Dispense:  30 g    Refill:  1    Danasia Baker Luther Parody, MD

## 2020-05-10 DIAGNOSIS — M1712 Unilateral primary osteoarthritis, left knee: Secondary | ICD-10-CM | POA: Diagnosis not present

## 2020-05-14 ENCOUNTER — Telehealth: Payer: Self-pay | Admitting: Interventional Cardiology

## 2020-05-14 DIAGNOSIS — E785 Hyperlipidemia, unspecified: Secondary | ICD-10-CM

## 2020-05-14 MED ORDER — PRAVASTATIN SODIUM 20 MG PO TABS: ORAL_TABLET | ORAL | 11 refills | Status: DC

## 2020-05-14 NOTE — Telephone Encounter (Signed)
Pt had lipids checked so these do not need to be redrawn, however her lipids were elevated and Repatha was removed form her med list on 04/10/20 as pt reported no longer taking it. Her LDL was 118 above goal < 70 and TG 199 above goal < 150 on most recent labs. She's previously intolerant to rosuvastatin 5mg  2x/week, daily, and 20mg  daily and atorvastatin 10mg  daily, currently only taking Lovaza 2g BID.  Spoke with pt, she never started Repatha shots (have discussed starting these at least 3x and she still has not). She remains unwilling to try PCSK9i therapy and prefers to try another pill. She is willing to start pravastatin 20mg  3 days weekly and titrate up as tolerated. She also notes she wasn't fasting when her labs were drawn, which explains her elevated TG (historically ranges 40-60 so these will not need to be treated). Will recheck lipids 2/14 prior to 2/16 visit with APP.  Will let Dr Tamala Julian weigh in on any other labs needed prior to Feb visit, these can be added on to her lab appt already scheduled on 2/14.

## 2020-05-14 NOTE — Telephone Encounter (Signed)
Pt is calling to request having labs done prior to her one year office visit with [name] NP on 2/18.  She is an established Dr. Tamala Julian pt.  Pt would like to have labs done prior to this visit, so that any results can be further discussed at that time.  Pt is seen in our lipid clinic for repatha.  Pt just recently had labs done on 04/10/20 by Internal Medicine Dr. Anastasio Champion.  Will route this message to Dr. Tamala Julian, his primary covering RN, as well as our lipid clinic, to further review and advise if labs are necessary prior to Feb appt, for she just had them done on 12/14.  Dr. Thompson Caul Nurse or triage to follow-up with the pt accordingly thereafter, with further  recommendations.

## 2020-05-14 NOTE — Telephone Encounter (Signed)
Doesn't look like she's taking PCSK-9 inhibitor, most recent cholesterol lab is good if Sharee Pimple wants to ask her about starting med

## 2020-05-14 NOTE — Telephone Encounter (Signed)
Patient requesting her yearly lab work prior to her appointment in February. She would like a call back when the lab orders are put in. She states it is okay to leave message.

## 2020-05-15 NOTE — Telephone Encounter (Signed)
All appropriate labs have been done.  Okay to repeat lipd as suggested

## 2020-05-16 ENCOUNTER — Telehealth (INDEPENDENT_AMBULATORY_CARE_PROVIDER_SITE_OTHER): Payer: Self-pay

## 2020-05-16 ENCOUNTER — Telehealth: Payer: Self-pay | Admitting: *Deleted

## 2020-05-16 NOTE — Telephone Encounter (Signed)
   [facility] Pre-operative Risk Assessment    HEARTCARE STAFF: - Please ensure there is not already an duplicate clearance open for this procedure. - Under Visit Info/Reason for Call, type in Other and utilize the format Clearance MM/DD/YY or Clearance TBD. Do not use dashes or single digits. - If request is for dental extraction, please clarify the # of teeth to be extracted.  Request for surgical clearance:  1. What type of surgery is being performed? LEFT KNEE ARTHROPLASTY   2. When is this surgery scheduled? 07/16/20   3. What type of clearance is required (medical clearance vs. Pharmacy clearance to hold med vs. Both)? MEDICAL  4. Are there any medications that need to be held prior to surgery and how long? NONE LISTED   5. Practice name and name of physician performing surgery? [facility]; DR. FRANK ALUISIO   6. What is the office phone number? [phone]   7.   What is the office fax number? [phone] ATTN: [place]  8.   Anesthesia type (None, local, MAC, general) ? CHOICE   Julaine Hua 05/16/2020, 12:39 PM  _________________________________________________________________   (provider comments below)

## 2020-05-16 NOTE — Telephone Encounter (Signed)
Patient called and left a detailed voice message that she wanted to start on only 2 of the 4 medications that Dr. Anastasio Champion recommended.   I advised Judson Roch and she suggested to start on the Estradiol 1 mg and the Progesterone 200 mg together.  I called the patient back and gave her the recommendations. Patient verbalized an understanding and will reach out to [place] if she has further questions.

## 2020-05-17 NOTE — Telephone Encounter (Addendum)
   Primary Cardiologist: Sinclair Grooms, MD  Chart reviewed as part of pre-operative protocol coverage. Because of Courtney [name]'s past medical history and time since last visit, she will require a follow-up visit in order to better assess preoperative cardiovascular risk.  She has an appointment scheduled for 06/13/20 with [name], NP. I have updated the appointment notes.   Will route to patient's primary cardiologist for recommendations regarding holding Aspirin prior to procedure.   Will route to requesting party's office via [facility] fax function so they are aware.    Loel Dubonnet, NP  05/17/2020, 11:17 AM

## 2020-05-17 NOTE — Telephone Encounter (Signed)
Per Dr. Tamala Julian may hold Aspirin prior to orthopedic surgery at the direction of the surgeon. Has office visit with [name] upcoming for cardiac clearance. Will route to her as FYI.   Loel Dubonnet, NP

## 2020-05-17 NOTE — Telephone Encounter (Signed)
She can hold aspirin based on instructions from Dr. Wynelle Link

## 2020-05-17 NOTE — Telephone Encounter (Signed)
Removing from preop pool.  Loel Dubonnet, NP

## 2020-05-23 ENCOUNTER — Telehealth (INDEPENDENT_AMBULATORY_CARE_PROVIDER_SITE_OTHER): Payer: Self-pay

## 2020-05-23 ENCOUNTER — Other Ambulatory Visit (INDEPENDENT_AMBULATORY_CARE_PROVIDER_SITE_OTHER): Payer: Self-pay | Admitting: Internal Medicine

## 2020-05-23 MED ORDER — PROGESTERONE 200 MG PO CAPS: 400.0000 mg | ORAL_CAPSULE | Freq: Every day | ORAL | 3 refills | Status: DC

## 2020-05-23 NOTE — Telephone Encounter (Signed)
Let the patient know that I would recommend that she increase the progesterone at night so that she is taking progesterone 200 mg capsules, take 2 capsules instead of 1 capsule that she is currently taking.  Of course, with the increased dose, she will need a new prescription and I will send this to her pharmacy.  If this does not work, then she may be one of the extremely rare patients who respond opposite with progesterone than the expected symptom relief.

## 2020-05-23 NOTE — Telephone Encounter (Signed)
Called the patient and gave her the message from Dr. Anastasio Champion and let her know that the new prescription has been sent to her pharmacy. Patient verbalized an understanding and stated that she will let us know how she is feeling.

## 2020-05-23 NOTE — Telephone Encounter (Signed)
Patient called and stated that after she started her hormones that she has not been able to sleep at night and she has never had this happen before. Patient stated that she is taking her progesterone before bedtime and she is taking estradiol in the morning after breakfast. Patient stated that she has tried the melatonin and this is not helping at all. Patient is wondering if she needs to take this at different times?   Please advise.

## 2020-05-28 ENCOUNTER — Other Ambulatory Visit: Payer: Self-pay

## 2020-05-28 ENCOUNTER — Ambulatory Visit (INDEPENDENT_AMBULATORY_CARE_PROVIDER_SITE_OTHER): Payer: Medicare Other | Admitting: Allergy

## 2020-05-28 ENCOUNTER — Encounter: Payer: Self-pay | Admitting: Allergy

## 2020-05-28 VITALS — BP 120/86 | HR 77 | Temp 96.0°F | Resp 18 | Ht 59.0 in | Wt 114.4 lb

## 2020-05-28 DIAGNOSIS — L239 Allergic contact dermatitis, unspecified cause: Secondary | ICD-10-CM | POA: Diagnosis not present

## 2020-05-28 NOTE — Assessment & Plan Note (Signed)
Questionable remote history of reaction to nickel in the form of rash. Had bilateral titanium hip replacements previously with no issues. Scheduled for left knee replacement.  . Metal patches placed today. . Please avoid strenuous physical activities and do not get the patches on the back wet. No showering until final patch reading done. Faythe Ghee to take antihistamines for itching but avoid placing any creams on the back where the patches are. . We will remove the patches on Wednesday and will do our initial read. . Then you will come back on Friday for a final read.

## 2020-05-28 NOTE — Patient Instructions (Signed)
.   Patches placed today. . Please avoid strenuous physical activities and do not get the patches on the back wet. No showering until final patch reading done. . Okay to take antihistamines for itching but avoid placing any creams on the back where the patches are. . We will remove the patches on Wednesday and will do our initial read. . Then you will come back on Friday for a final read. 

## 2020-05-28 NOTE — Progress Notes (Signed)
New Patient Note  RE: Courtney [name] MRN: [id number] DOB: 1950/08/30 Date of Office Visit: 05/28/2020  Referring provider: Cassandria Anger, MD Primary care provider: Cassandria Anger, MD  Chief Complaint: Allergic Reaction Harlow Mares)  History of Present Illness: I had the pleasure of seeing Courtney [name] for initial evaluation at the [facility] on 05/28/2020. She is a 70 y.o. female, who is referred here by Dr. Wynelle Link (orthopedics) for the evaluation of nickel allergy.  Patient scheduled for left knee arthroplasty on 07/16/2020. Patient had hip replacements in 2002 and 2013 without any issues - healed well, no pain and no post-op complications. She states those joints were made of titanium.   Patient states there may have been a very remote history of possible reaction to cheap jewelry but she is not sure as she has not used any costume jewelry in a long time.  Patient also states she broke out in a rash after wearing a cheaper watch in the past.  Patient may have had rashes after contact with metals on the pants but she is not sure.   No issues with glasses.  No issues with gold, silver or any other metals.   Assessment and Plan: Courtney [name] is a 70 y.o. female with: Allergic contact dermatitis Questionable remote history of reaction to nickel in the form of rash. Had bilateral titanium hip replacements previously with no issues. Scheduled for left knee replacement.  . Metal patches placed today. . Please avoid strenuous physical activities and do not get the patches on the back wet. No showering until final patch reading done. Faythe Ghee to take antihistamines for itching but avoid placing any creams on the back where the patches are. . We will remove the patches on Wednesday and will do our initial read. . Then you will come back on Friday for a final read.  Return in about 2 days (around 05/30/2020) for Patch reading.  Other allergy  screening: Asthma: no Rhino conjunctivitis:  Mild rhinitis symptoms.  Food allergy: gluten Medication allergy: yes Hymenoptera allergy: no Urticaria: no Eczema:no History of recurrent infections suggestive of immunodeficency: no  Diagnostics:   Metals Patch - 05/28/20 0901    Time Antigen Placed 0901    Manufacturer [employer]er    Location Back    Number of Test 11    Reading Interval Day 1    Select Select           Past Medical History: Patient Active Problem List   Diagnosis Date Noted  . Allergic contact dermatitis 05/28/2020  . Degenerative disc disease, cervical 06/15/2019  . Acquired leg length discrepancy 06/15/2019  . Leg pain 06/14/2019  . Toe ulcer ([place]) 06/14/2019  . Mixed hyperlipidemia 02/04/2019  . 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  . Spinal stenosis, lumbar region, 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  . Sciatica of right side associated with disorder of lumbar spine 02/26/2017  . CAD in native artery 12/23/2016  . Abdominal pain, epigastric 05/09/2015  . Dry skin dermatitis 08/04/2012  . Constipation - functional 08/04/2012  . History of hypothyroidism 08/04/2012  . Otitis media, serous 12/25/2010  . OSTEOARTHRITIS, HIPS, BILATERAL 06/20/2010  . ANXIETY 05/03/2009  . TOBACCO USE, QUIT 05/03/2009  . ONYCHOMYCOSIS 06/28/2008  . Actinic keratosis 06/28/2008  . Bilateral carotid bruits 06/28/2008  . Attention deficit disorder 05/17/2007  . Irritable bowel syndrome 05/17/2007  . Dyslipidemia 02/28/2007  . Depression 02/28/2007  . Osteoarthritis 02/28/2007   Past Medical History:  Diagnosis Date  .  ADD (attention deficit disorder)   . Anxiety   . Celiac disease    possible vs IBS  . Depression    no bipolar per Dr. [name]  . GI problem    [name]  . Hyperlipidemia   . IBS (irritable bowel syndrome)   . Osteoarthritis   . Osteoporosis   . Seasonal allergies 01-08-12   hx. of multiple bronchitis related to this.  . Shoulder  pain, right    Past Surgical History: Past Surgical History:  Procedure Laterality Date  . EXPLORATORY LAPAROTOMY  01-08-12   due to endometriosis-portions of both ovaries removed  . TONSILLECTOMY    . TOTAL HIP ARTHROPLASTY     right. 2002  . TOTAL HIP ARTHROPLASTY  01/14/2012   Procedure: TOTAL HIP ARTHROPLASTY;  Surgeon: Gearlean Alf, MD;  Location: [facility];  Service: Orthopedics;  Laterality: Left;  [name] VENTRAL HERNIA REPAIR  01-08-12   abdominal   Medication List:  Current Outpatient Medications  Medication Sig Dispense Refill  . ALPRAZolam (XANAX) 1 MG tablet Take 0.5-1 mg by mouth daily as needed.    [name] amoxicillin (AMOXIL) 500 MG tablet Take 1,000 mg by mouth 2 (two) times daily. Dental procedures only    . Calcium Carb-Cholecalciferol (CALCIUM 600+D3 PO) Take 1,200 tablets by mouth daily.     . cholecalciferol (VITAMIN D3) 25 MCG (1000 UT) tablet Take 4,000 Units by mouth daily.    [name] estradiol (ESTRACE) 1 MG tablet Take 1 tablet (1 mg total) by mouth daily. 30 tablet 3  . gabapentin (NEURONTIN) 100 MG capsule Take 100 mg by mouth 3 (three) times daily as needed (nerve pain).    . meloxicam (MOBIC) 15 MG tablet Take 15 mg by mouth daily.    . methocarbamol (ROBAXIN) 500 MG tablet Take 1 tablet (500 mg total) by mouth 4 (four) times daily. 60 tablet 1  . methylphenidate (CONCERTA) 54 MG PO CR tablet Take 1 tablet (54 mg total) by mouth every morning. 30 tablet 0  . NP THYROID 60 MG tablet Take 1 tablet (60 mg total) by mouth daily before breakfast. 30 tablet 3  . omega-3 acid ethyl esters (LOVAZA) 1 g capsule Take 2 capsules (2 g total) by mouth 2 (two) times daily. 120 capsule 11  . penicillin v potassium (VEETID) 500 MG tablet Take 500 mg by mouth 4 (four) times daily.    . pravastatin (PRAVACHOL) 20 MG tablet Take up to 1 tablet daily as tolerated. 30 tablet 11  . pregabalin (LYRICA) 75 MG capsule Take 75 mg by mouth 2 (two) times daily.    . progesterone (PROMETRIUM) 200 MG  capsule Take 2 capsules (400 mg total) by mouth daily. 60 capsule 3  . Testosterone 1.62 % GEL Place 5 mg onto the skin daily. 30 g 1  . traMADol (ULTRAM) 50 MG tablet Take 0.5-1 tablets (25-50 mg total) by mouth every 8 (eight) hours as needed for severe pain (back). 90 tablet 1  . triamcinolone ointment (KENALOG) 0.5 % Apply 1 application topically 2 (two) times daily. 120 g 3  . Vilazodone HCl (VIIBRYD) 20 MG TABS Take 1 tablet (20 mg total) by mouth daily at 12 noon. 30 tablet 1  . vitamin E 400 UNIT capsule Take 800 Units by mouth daily.     . nitroGLYCERIN (NITROSTAT) 0.4 MG SL tablet Place 1 tablet (0.4 mg total) under the tongue every 5 (five) minutes as needed for chest pain. 25 tablet 2   No  current facility-administered medications for this visit.   Allergies: Allergies  Allergen Reactions  . Clarithromycin Nausea Only    REACTION: nausea  . Other Other (See Comments)  . Bactrim [Sulfamethoxazole-Trimethoprim] Nausea And Vomiting  . Morphine Nausea And Vomiting  . Morphine And Related Nausea And Vomiting  . Ceclor [Cefaclor] Nausea And Vomiting    Can take Augmentin ok  . Codeine Nausea And Vomiting  . Cymbalta [Duloxetine Hcl]   . Erythromycin Nausea And Vomiting  . Erythromycin Base Nausea And Vomiting  . Levofloxacin Nausea Only    REACTION: nausea  . Loratadine     REACTION: bruises  . Nitrofurantoin Nausea Only  . Propoxyphene Nausea Only    dizzy  . Propoxyphene N-Acetaminophen Nausea Only    dizzy  . Zithromax [Azithromycin] Nausea And Vomiting   Social History: Social History   Socioeconomic History  . Marital status: Married    Spouse name: Not on file  . Number of children: Not on file  . Years of education: Not on file  . Highest education level: Not on file  Occupational History  . Occupation: Futures trader  Tobacco Use  . Smoking status: Never Smoker  . Smokeless tobacco: Never Used  . Tobacco comment: only social  Vaping Use  . Vaping  Use: Never used  Substance and Sexual Activity  . Alcohol use: Yes    Alcohol/week: 0.0 standard drinks    Comment: rare social   . Drug use: No  . Sexual activity: Yes  Other Topics Concern  . 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   Social Determinants of Health   Financial Resource Strain: Not on file  Food Insecurity: Not on file  Transportation Needs: Not on file  Physical Activity: Not on file  Stress: Not on file  Social Connections: Not on file   Lives in a house which is built in 1994. Smoking: denies Occupation: retired  Programme researcher, broadcasting/film/video HistoryFreight forwarder in the house: no Charity fundraiser in the family room: yes Carpet in the bedroom: yes Heating: gas Cooling: central Pet: no  Family History: Family History  Problem Relation Age of Onset  . Heart disease Mother   . Heart attack Mother   . Prostate cancer Father   . Coronary artery disease Other        FH Female 1st degree relative <60  . ADD / ADHD Other    Problem                               Relation Asthma                                   No  Eczema                                No  Food allergy                          No  Allergic rhino conjunctivitis     No   Review of Systems  Constitutional: Negative for appetite change, chills, fever and unexpected weight change.  HENT: Negative for congestion and rhinorrhea.   Eyes: Negative for itching.  Respiratory: Negative  for cough, chest tightness, shortness of breath and wheezing.   Cardiovascular: Negative for chest pain.  Gastrointestinal: Negative for abdominal pain.  Genitourinary: Negative for difficulty urinating.  Skin: Negative for rash.  Neurological: Negative for headaches.   Objective: BP 120/86   Pulse 77   Temp (!) 96 F (35.6 C) (Temporal)   Resp 18   Ht 4\' 11"  (1.499 m)   Wt 114 lb 6.4 oz (51.9 kg)   LMP 01/08/1999    SpO2 98%   BMI 23.11 kg/m  Body mass index is 23.11 kg/m. Physical Exam Vitals and nursing note reviewed.  Constitutional:      Appearance: Normal appearance. She is well-developed.  HENT:     Head: Normocephalic and atraumatic.     Right Ear: External ear normal.     Left Ear: External ear normal.     Nose: Nose normal.     Mouth/Throat:     Mouth: Mucous membranes are moist.     Pharynx: Oropharynx is clear.  Eyes:     Conjunctiva/sclera: Conjunctivae normal.  Cardiovascular:     Rate and Rhythm: Normal rate and regular rhythm.     Heart sounds: Normal heart sounds. No murmur heard. No friction rub. No gallop.   Pulmonary:     Effort: Pulmonary effort is normal.     Breath sounds: Normal breath sounds. No wheezing, rhonchi or rales.  Abdominal:     Palpations: Abdomen is soft.  Musculoskeletal:     Cervical back: Neck supple.  Skin:    General: Skin is warm.     Findings: No rash.  Neurological:     Mental Status: She is alert and oriented to person, place, and time.  Psychiatric:        Behavior: Behavior normal.    The plan was reviewed with the patient/family, and all questions/concerned were addressed.  It was my pleasure to see Courtney [name] today and participate in her care. Please feel free to contact me with any questions or concerns.  Sincerely,  Rexene Alberts, DO Allergy & Immunology  [facility] office: [place] office: [phone]

## 2020-05-29 NOTE — Progress Notes (Signed)
   Follow Up Note  RE: Courtney [name] MRN: [id number] DOB: 1950-04-29 Date of Office Visit: 05/30/2020  Referring provider: Cassandria Anger, MD Primary care provider: Cassandria Anger, MD  History of Present Illness: I had the pleasure of seeing Courtney [name] for a follow up visit at the [facility] [place] on 05/30/2020. She is a 70 y.o. female, who is being followed for possible contact dermatitis to nickel. Today she is here for initial patch test interpretation, given suspected history of contact dermatitis.   Diagnostics:  Metal patch test 48 hour reading:   Metals Patch - 05/30/20 1000    Time Antigen Placed 1003    Number of Test 11    Reading Interval Day 3    Aluminum Hydroxide 10% 0    Chromium chloride 1% 0    Cobalt chloride hexahydrate 1% 0    Molybdenum chloride 0.5% 0    Nickel sulfate hexahydrate 5% 0    Potassium dichromate 0.25% 0    Copper sulfate pentahydrate 2% 0    Tantal 1% 0    Titanium 0.1% 0    Manganese chloride 0.5% 0    Vanadium Pentoxide 10% 0            Assessment and Plan: Courtney [name] is a 70 y.o. female with: Allergic contact dermatitis Past history - Questionable remote history of reaction to nickel in the form of rash. Had bilateral titanium hip replacements previously with no issues. Scheduled for left knee replacement.  Interim history  . Metal patches removed today. . 48 hour reading - all negative. . Follow up on Friday for final read.  Return in about 2 days (around 06/01/2020) for Patch reading.  It was my pleasure to see Courtney [name] today and participate in her care. Please feel free to contact me with any questions or concerns.  Sincerely,  Rexene Alberts, DO Allergy & Immunology  [facility] office: [phone] [facility] office: [place] office: [phone]

## 2020-05-30 ENCOUNTER — Encounter: Payer: Self-pay | Admitting: Allergy

## 2020-05-30 ENCOUNTER — Other Ambulatory Visit: Payer: Self-pay

## 2020-05-30 ENCOUNTER — Ambulatory Visit: Payer: Medicare Other | Admitting: Allergy

## 2020-05-30 VITALS — Temp 97.3°F

## 2020-05-30 DIAGNOSIS — L239 Allergic contact dermatitis, unspecified cause: Secondary | ICD-10-CM

## 2020-05-30 NOTE — Assessment & Plan Note (Signed)
Past history - Questionable remote history of reaction to nickel in the form of rash. Had bilateral titanium hip replacements previously with no issues. Scheduled for left knee replacement.  Interim history  . Metal patches removed today. . 48 hour reading - all negative. . Follow up on Friday for final read.

## 2020-05-31 ENCOUNTER — Encounter: Payer: Self-pay | Admitting: Internal Medicine

## 2020-05-31 ENCOUNTER — Ambulatory Visit (INDEPENDENT_AMBULATORY_CARE_PROVIDER_SITE_OTHER): Payer: Medicare Other | Admitting: Internal Medicine

## 2020-05-31 VITALS — BP 130/80 | HR 89 | Temp 98.4°F | Ht 59.0 in | Wt 115.4 lb

## 2020-05-31 DIAGNOSIS — F324 Major depressive disorder, single episode, in partial remission: Secondary | ICD-10-CM

## 2020-05-31 DIAGNOSIS — I2510 Atherosclerotic heart disease of native coronary artery without angina pectoris: Secondary | ICD-10-CM | POA: Diagnosis not present

## 2020-05-31 DIAGNOSIS — E785 Hyperlipidemia, unspecified: Secondary | ICD-10-CM | POA: Diagnosis not present

## 2020-05-31 DIAGNOSIS — Z780 Asymptomatic menopausal state: Secondary | ICD-10-CM | POA: Diagnosis not present

## 2020-05-31 DIAGNOSIS — Z01818 Encounter for other preprocedural examination: Secondary | ICD-10-CM

## 2020-05-31 NOTE — Assessment & Plan Note (Addendum)
Dr Anastasio Champion is prescribing hormones: testosterone, progesterone, estrogen, thyroid. The patient has continued to see Dr. Anastasio Champion.

## 2020-05-31 NOTE — Progress Notes (Addendum)
Subjective:  Patient ID: Courtney [name], female    DOB: 08/12/1950  Age: 70 y.o. MRN: [id number]  CC: Medical Clearance ((L) Knee Replacement)   HPI Courtney [name] presents for pre-op exam  Reason L TKR on 3/31 by Dr. Wynelle [name].  Needs medical clearance  Requested by Dr. Wynelle [name]  History: Follow-up on osteoarthritis, hypothyroidism, anxiety-all stable  She is seeing Dr. Anastasio Champion for endocrine support.  Dr Anastasio Champion is prescribing hormones: testosterone, progesterone, estrogen, thyroid  Past Medical History:  Diagnosis Date  . ADD (attention deficit disorder)   . Anxiety   . Celiac disease    possible vs IBS  . Depression    no bipolar per Dr. [name]  . GI problem    [name]  . Hyperlipidemia   . IBS (irritable bowel syndrome)   . Osteoarthritis   . Osteoporosis   . Seasonal allergies 01-08-12   hx. of multiple bronchitis related to this.  . Shoulder pain, right    Past Surgical History:  Procedure Laterality Date  . EXPLORATORY LAPAROTOMY  01-08-12   due to endometriosis-portions of both ovaries removed  . TONSILLECTOMY    . TOTAL HIP ARTHROPLASTY     right. 2002  . TOTAL HIP ARTHROPLASTY  01/14/2012   Procedure: TOTAL HIP ARTHROPLASTY;  Surgeon: Gearlean Alf, MD;  Location: [facility];  Service: Orthopedics;  Laterality: Left;  [name] VENTRAL HERNIA REPAIR  01-08-12   abdominal    reports that she has never smoked. She has never used smokeless tobacco. She reports current alcohol use. She reports that she does not use drugs. family history includes ADD / ADHD in an other family member; Coronary artery disease in an other family member; Heart attack in her mother; Heart disease in her mother; Prostate cancer in her father. Allergies  Allergen Reactions  . Clarithromycin Nausea Only    REACTION: nausea  . Other Other (See Comments)  . Bactrim [Sulfamethoxazole-Trimethoprim] Nausea And Vomiting  . Morphine Nausea And Vomiting  . Morphine And Related Nausea And  Vomiting  . Ceclor [Cefaclor] Nausea And Vomiting    Can take Augmentin ok  . Codeine Nausea And Vomiting  . Cymbalta [Duloxetine Hcl]   . Erythromycin Nausea And Vomiting  . Erythromycin Base Nausea And Vomiting  . Levofloxacin Nausea Only    REACTION: nausea  . Loratadine     REACTION: bruises  . Nitrofurantoin Nausea Only  . Propoxyphene Nausea Only    dizzy  . Propoxyphene N-Acetaminophen Nausea Only    dizzy  . Zithromax [Azithromycin] Nausea And Vomiting     Outpatient Medications Prior to Visit  Medication Sig Dispense Refill  . ALPRAZolam (XANAX) 1 MG tablet Take 0.5-1 mg by mouth daily as needed.    [name] amoxicillin (AMOXIL) 500 MG tablet Take 1,000 mg by mouth 2 (two) times daily. Dental procedures only    . Calcium Carb-Cholecalciferol (CALCIUM 600+D3 PO) Take 1,200 tablets by mouth daily.     . cholecalciferol (VITAMIN D3) 25 MCG (1000 UT) tablet Take 4,000 Units by mouth daily.    [name] estradiol (ESTRACE) 1 MG tablet Take 1 tablet (1 mg total) by mouth daily. 30 tablet 3  . meloxicam (MOBIC) 15 MG tablet Take 15 mg by mouth daily.    . methocarbamol (ROBAXIN) 500 MG tablet Take 1 tablet (500 mg total) by mouth 4 (four) times daily. 60 tablet 1  . methylphenidate (CONCERTA) 54 MG PO CR tablet Take 1 tablet (54 mg total) by mouth every morning. [place]  tablet 0  . NP THYROID 60 MG tablet Take 1 tablet (60 mg total) by mouth daily before breakfast. 30 tablet 3  . omega-3 acid ethyl esters (LOVAZA) 1 g capsule Take 2 capsules (2 g total) by mouth 2 (two) times daily. 120 capsule 11  . pravastatin (PRAVACHOL) 20 MG tablet Take up to 1 tablet daily as tolerated. 30 tablet 11  . pregabalin (LYRICA) 75 MG capsule Take 75 mg by mouth 2 (two) times daily.    . progesterone (PROMETRIUM) 200 MG capsule Take 2 capsules (400 mg total) by mouth daily. 60 capsule 3  . Testosterone 1.62 % GEL Place 5 mg onto the skin daily. 30 g 1  . traMADol (ULTRAM) 50 MG tablet Take 0.5-1 tablets (25-50 mg  total) by mouth every 8 (eight) hours as needed for severe pain (back). 90 tablet 1  . triamcinolone ointment (KENALOG) 0.5 % Apply 1 application topically 2 (two) times daily. 120 g 3  . Vilazodone HCl (VIIBRYD) 20 MG TABS Take 1 tablet (20 mg total) by mouth daily at 12 noon. 30 tablet 1  . vitamin E 400 UNIT capsule Take 800 Units by mouth daily.     . nitroGLYCERIN (NITROSTAT) 0.4 MG SL tablet Place 1 tablet (0.4 mg total) under the tongue every 5 (five) minutes as needed for chest pain. 25 tablet 2  . gabapentin (NEURONTIN) 100 MG capsule Take 100 mg by mouth 3 (three) times daily as needed (nerve pain). (Patient not taking: Reported on 05/31/2020)    . penicillin v potassium (VEETID) 500 MG tablet Take 500 mg by mouth 4 (four) times daily. (Patient not taking: Reported on 05/31/2020)     No facility-administered medications prior to visit.    ROS: Review of Systems  Constitutional: Negative for activity change, appetite change, chills, fatigue and unexpected weight change.  HENT: Negative for congestion, mouth sores and sinus pressure.   Eyes: Negative for visual disturbance.  Respiratory: Negative for cough and chest tightness.   Gastrointestinal: Negative for abdominal pain and nausea.  Genitourinary: Negative for difficulty urinating, frequency and vaginal pain.  Musculoskeletal: Positive for arthralgias and gait problem. Negative for back pain.  Skin: Negative for pallor and rash.  Neurological: Negative for dizziness, tremors, weakness, numbness and headaches.  Psychiatric/Behavioral: Positive for decreased concentration. Negative for confusion and sleep disturbance. The patient is nervous/anxious.     Objective:  BP 130/80 (BP Location: Left Arm)   Pulse 89   Temp 98.4 F (36.9 C) (Oral)   Ht 4\' 11"  (1.499 m)   Wt 115 lb 6.4 oz (52.3 kg)   LMP 01/08/1999   SpO2 98%   BMI 23.31 kg/m   BP Readings from Last 3 Encounters:  05/31/20 130/80  05/28/20 120/86  05/08/20 (!)  148/80    Wt Readings from Last 3 Encounters:  05/31/20 115 lb 6.4 oz (52.3 kg)  05/28/20 114 lb 6.4 oz (51.9 kg)  05/08/20 113 lb 3.2 oz (51.3 kg)    Physical Exam Constitutional:      General: She is not in acute distress.    Appearance: She is well-developed.  HENT:     Head: Normocephalic.     Right Ear: External ear normal.     Left Ear: External ear normal.     Nose: Nose normal.     Mouth/Throat:     Mouth: Oropharynx is clear and moist.  Eyes:     General:        Right eye:  No discharge.        Left eye: No discharge.     Conjunctiva/sclera: Conjunctivae normal.     Pupils: Pupils are equal, round, and reactive to light.  Neck:     Thyroid: No thyromegaly.     Vascular: No JVD.     Trachea: No tracheal deviation.  Cardiovascular:     Rate and Rhythm: Normal rate and regular rhythm.     Heart sounds: Normal heart sounds.  Pulmonary:     Effort: No respiratory distress.     Breath sounds: No stridor. No wheezing.  Abdominal:     General: Bowel sounds are normal. There is no distension.     Palpations: Abdomen is soft. There is no mass.     Tenderness: There is no abdominal tenderness. There is no guarding or rebound.  Musculoskeletal:        General: Tenderness present. No edema.     Cervical back: Normal range of motion and neck supple.  Lymphadenopathy:     Cervical: No cervical adenopathy.  Skin:    Findings: No erythema or rash.  Neurological:     Cranial Nerves: No cranial nerve deficit.     Motor: No abnormal muscle tone.     Coordination: Coordination normal.     Deep Tendon Reflexes: Reflexes normal.  Psychiatric:        Mood and Affect: Mood and affect normal.        Behavior: Behavior normal.        Thought Content: Thought content normal.        Judgment: Judgment normal.   Left knee is tender with range of motion  Procedure: EKG Indication: pre-op Impression: NSR. HR 83. No acute changes.    A total time of >45 minutes was spent  preparing to see the patient, reviewing tests, x-rays, records.  Also, obtaining history and performing comprehensive physical exam.  Additionally, counseling the patient regarding the above listed issues.   Finally, documenting clinical information in the health records, coordination of care, educating the patient.   Lab Results  Component Value Date   WBC 5.0 06/14/2018   HGB 12.1 06/14/2018   HCT 35.6 (L) 06/14/2018   PLT 258 06/14/2018   GLUCOSE 95 04/10/2020   CHOL 235 (H) 04/10/2020   TRIG 199 (H) 04/10/2020   HDL 85 04/10/2020   LDLDIRECT 120.7 08/04/2012   LDLCALC 118 (H) 04/10/2020   ALT 11 04/10/2020   AST 17 04/10/2020   NA 135 04/10/2020   K 4.4 04/10/2020   CL 98 04/10/2020   CREATININE 0.76 04/10/2020   BUN 19 04/10/2020   CO2 30 04/10/2020   TSH 2.29 04/10/2020   INR 0.96 01/08/2012   HGBA1C 5.4 02/11/2018    VAS [place] LOWER EXT ART SEG MULTI (SEGMENTALS & LE RAYNAUDS)  Result Date: 07/15/2019 LOWER EXTREMITY DOPPLER STUDY Indications: Patient presents today with complaints of numbness and pain to the              lower extremities, right worse than left. She states when standing              still she can feel the pain and numbness on the right starting from              her right hip/buttocks area on down to the foot. When she walks she              experiences pain in both legs, from the hip  down to the foot and              significantly at the lateral lower leg from the knee down on the              right. This has been going on for at least 5 years. In the past 6              months she has been experiencing lower lateral pain from the knee              down on the left with walking. The pain in both legs can be intense              where she has to stop and rest. She does have a history of sciatica              of the right side associated with disorder of the lumbar spine. 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 It is difficult to distinguish arterial              claudication symptoms vs neurogenic claudication symptoms based on              patient assessment. High Risk         Hyperlipidemia, past history of smoking, coronary artery Factors:          disease.  Comparison Study: NA Performing Technologist: Sharlett Iles RVT  Examination Guidelines: A complete evaluation includes at minimum, Doppler waveform signals and systolic blood pressure reading at the level of bilateral brachial, anterior tibial, and posterior tibial arteries, when vessel segments are accessible. Bilateral testing is considered an integral part of a complete examination. Photoelectric Plethysmograph (PPG) waveforms and toe systolic pressure readings are included as required and additional duplex testing as needed. Limited examinations for reoccurring indications may be performed as noted.  ABI Findings: +---------+------------------+-----+---------+--------+ Right    Rt Pressure (mmHg)IndexWaveform Comment  +---------+------------------+-----+---------+--------+ Brachial 130                                      +---------+------------------+-----+---------+--------+ CFA                             triphasic         +---------+------------------+-----+---------+--------+ Popliteal                       triphasic         +---------+------------------+-----+---------+--------+ ATA      160               1.20 triphasic         +---------+------------------+-----+---------+--------+ PTA      165               1.24 biphasic          +---------+------------------+-----+---------+--------+ PERO     161               1.21 triphasic         +---------+------------------+-----+---------+--------+ Great Toe130               0.98 Normal            +---------+------------------+-----+---------+--------+ +---------+------------------+-----+---------+-------+  Left     Lt Pressure  (mmHg)IndexWaveform Comment +---------+------------------+-----+---------+-------+ Brachial 133                                     +---------+------------------+-----+---------+-------+ CFA                             triphasic        +---------+------------------+-----+---------+-------+ Popliteal                       triphasic        +---------+------------------+-----+---------+-------+ ATA      158               1.19 triphasic        +---------+------------------+-----+---------+-------+ PTA      167               1.26 triphasic        +---------+------------------+-----+---------+-------+ PERO     150               1.13 triphasic        +---------+------------------+-----+---------+-------+ Great Toe127               0.95 Normal           +---------+------------------+-----+---------+-------+ +-------+-----------+-----------+ ABI/TBIToday's ABIToday's TBI +-------+-----------+-----------+ Right  1.24       0.98        +-------+-----------+-----------+ Left   1.26       0.95        +-------+-----------+-----------+  Summary: Right: Resting right ankle-brachial index is within normal range. No evidence of significant right lower extremity arterial disease. The right toe-brachial index is normal. Left: Resting left ankle-brachial index is within normal range. No evidence of significant left lower extremity arterial disease. The left toe-brachial index is normal.  *See table(s) above for measurements and observations.  Suggest follow up LE Arterial duplex if clinically indicated. Electronically signed by Quay Burow MD on 07/15/2019 at 8:05:47 AM.    Final     Assessment & Plan:      No orders of the defined types were placed in this encounter.    Follow-up: No follow-ups on file.  Walker Kehr, MD

## 2020-05-31 NOTE — Assessment & Plan Note (Addendum)
  On Viibrid - f/u w/Dr Clovis Pu

## 2020-05-31 NOTE — Assessment & Plan Note (Addendum)
F/u w/Dr Tamala Julian - Lipitor intolerant.  She is on pravastatin

## 2020-06-01 ENCOUNTER — Other Ambulatory Visit: Payer: Self-pay

## 2020-06-01 ENCOUNTER — Encounter: Payer: Self-pay | Admitting: Allergy

## 2020-06-01 ENCOUNTER — Ambulatory Visit (INDEPENDENT_AMBULATORY_CARE_PROVIDER_SITE_OTHER): Payer: Medicare Other | Admitting: Allergy

## 2020-06-01 ENCOUNTER — Encounter: Payer: Medicare Other | Admitting: Family

## 2020-06-01 DIAGNOSIS — L239 Allergic contact dermatitis, unspecified cause: Secondary | ICD-10-CM

## 2020-06-01 NOTE — Progress Notes (Signed)
    Follow-up Note  RE: Courtney [name] MRN: [id number] DOB: 1950-05-25 Date of Office Visit: 06/01/2020  Primary care provider: Cassandria Anger, MD Referring provider: Cassandria Anger, MD   Shynice returns to the office today for the final patch test interpretation, given suspected history of contact dermatitis.    Diagnostics:  Metal series 96 hour reading:  Negative Aluminum Hydroxide 10% 0   Chromium chloride 1% 0   Cobalt chloride hexahydrate 1% 0   Molybdenum chloride 0.5% 0   Nickel sulfate hexahydrate 5% 0   Potassium dichromate 0.25% 0   Copper sulfate pentahydrate 2% 0   Tantal 1% 0   Titanium 0.1% 0   Manganese chloride 0.5% 0   Vanadium Pentoxide 10% 0     Plan:  Patch testing to metal series is completely negative.  Will notify Dr. Wynelle Link of negative results.    Prudy Feeler, MD [facility]

## 2020-06-01 NOTE — Progress Notes (Signed)
Note sent via Epic to requested provider.

## 2020-06-03 DIAGNOSIS — Z01818 Encounter for other preprocedural examination: Secondary | ICD-10-CM | POA: Insufficient documentation

## 2020-06-03 NOTE — Assessment & Plan Note (Signed)
The patient spent a few months in [place] hiking.  There has been no chest pain or shortness of breath during exertion (knee pain only) She will continue with pravastatin

## 2020-06-03 NOTE — Assessment & Plan Note (Signed)
The patient is cleared for her left total knee replacement surgery, assuming her preop labs are acceptable.  She had an EKG done today which was okay. Thank you,

## 2020-06-04 ENCOUNTER — Other Ambulatory Visit: Payer: Self-pay | Admitting: Psychiatry

## 2020-06-04 ENCOUNTER — Telehealth: Payer: Self-pay | Admitting: Psychiatry

## 2020-06-04 DIAGNOSIS — R202 Paresthesia of skin: Secondary | ICD-10-CM | POA: Diagnosis not present

## 2020-06-04 DIAGNOSIS — D508 Other iron deficiency anemias: Secondary | ICD-10-CM | POA: Diagnosis not present

## 2020-06-04 DIAGNOSIS — F902 Attention-deficit hyperactivity disorder, combined type: Secondary | ICD-10-CM

## 2020-06-04 MED ORDER — METHYLPHENIDATE HCL ER (OSM) 54 MG PO TBCR: 54.0000 mg | EXTENDED_RELEASE_TABLET | ORAL | 0 refills | Status: DC

## 2020-06-04 NOTE — Telephone Encounter (Signed)
Pt left a message that she needs a refill on her concerta 54 mg to be sent to the [facility] in [place]. Next appt 06/18/20

## 2020-06-05 LAB — VITAMIN B12: Vitamin B-12: 496 pg/mL (ref 200–1100)

## 2020-06-06 NOTE — Progress Notes (Signed)
She says she takes vitamin B12 supplements occasionally but not daily.

## 2020-06-11 ENCOUNTER — Ambulatory Visit (INDEPENDENT_AMBULATORY_CARE_PROVIDER_SITE_OTHER): Payer: Medicare Other | Admitting: Internal Medicine

## 2020-06-11 ENCOUNTER — Other Ambulatory Visit: Payer: Medicare Other | Admitting: *Deleted

## 2020-06-11 ENCOUNTER — Other Ambulatory Visit: Payer: Self-pay

## 2020-06-11 DIAGNOSIS — E785 Hyperlipidemia, unspecified: Secondary | ICD-10-CM | POA: Diagnosis not present

## 2020-06-11 LAB — LIPID PANEL
Chol/HDL Ratio: 1.9 ratio (ref 0.0–4.4)
Cholesterol, Total: 223 mg/dL — ABNORMAL HIGH (ref 100–199)
HDL: 119 mg/dL (ref >39)
LDL Chol Calc (NIH): 93 mg/dL (ref 0–99)
Triglycerides: 61 mg/dL (ref 0–149)
VLDL Cholesterol Cal: 11 mg/dL (ref 5–40)

## 2020-06-11 NOTE — Progress Notes (Signed)
Cardiology Office Note   Date:  06/13/2020   ID:  Courtney [name], DOB [birth year], MRN [id number]  PCP:  Cassandria Anger, MD  Cardiologist:  Dr. Daneen Schick, MD   Chief Complaint  Patient presents with  . Follow-up    History of Present Illness: Courtney [name] is a 70 y.o. female who presents for annual follow-up and preoperative clearance, seen for Dr. Tamala Julian.  [name] has a history of CAD, family history of CAD and HLD.  She is followed by Dr. Tamala Julian for cardiology care.  On last office visit, she was very concerned about HLD medications.  She has tried statins in the past however he is caused muscle pain and cramping.  She was offered PCSK9 inhibitor, Repatha however she was concerned about self injection.  She underwent a nuclear stress test 05/2018 which showed an LVEF at 80% with no ST segment deviation, normal resting perfusion and no ischemia or infarct.  Prior to that, she had a coronary calcium score performed 05/2015 with a calcium score of 209 which was 15 percentile for age and sex matched control.  Today she is here for follow up and preoperative exam prior to knee replacement. She reports she has been doing well from a CV standpoint. She has recently started taking pravastatin twice weekly with hopes that she can tolerate increasing this to daily dosing. So far, she reports no muscle aches or other side effects. We reviewed her lipid panel that was performed yesterday. She has had some improvement however LDL and total CHO remain above goal. She states that her insurance would not cover Repatha. Denies chest pain, SOB, LE edema, palpitations, PND, orthopnea, dizziness, or syncope.   Upon evaluation today, she can achieve 4 METs or greater without anginal symptoms.  According to [facility] and AHA guidelines, she requires no further cardiac workup prior to her noncardiac surgery and should be at acceptable risk.  her NSQIP risk of peri-procedural MI or  cardiac arrest following knee replacement surgery is low.  Our service is available as necessary in the perioperative period.  Past Medical History:  Diagnosis Date  . ADD (attention deficit disorder)   . Anxiety   . Celiac disease    possible vs IBS  . Depression    no bipolar per Dr. [name]  . GI problem    [name]  . Hyperlipidemia   . IBS (irritable bowel syndrome)   . Osteoarthritis   . Osteoporosis   . Seasonal allergies 01-08-12   hx. of multiple bronchitis related to this.  . Shoulder pain, right     Past Surgical History:  Procedure Laterality Date  . EXPLORATORY LAPAROTOMY  01-08-12   due to endometriosis-portions of both ovaries removed  . TONSILLECTOMY    . TOTAL HIP ARTHROPLASTY     right. 2002  . TOTAL HIP ARTHROPLASTY  01/14/2012   Procedure: TOTAL HIP ARTHROPLASTY;  Surgeon: Gearlean Alf, MD;  Location: [facility];  Service: Orthopedics;  Laterality: Left;  [name] VENTRAL HERNIA REPAIR  01-08-12   abdominal     Current Outpatient Medications  Medication Sig Dispense Refill  . ALPRAZolam (XANAX) 1 MG tablet Take 0.5-1 mg by mouth daily as needed.    [name] amoxicillin (AMOXIL) 500 MG tablet Take 1,000 mg by mouth 2 (two) times daily. Dental procedures only    . Calcium Carb-Cholecalciferol (CALCIUM 600+D3 PO) Take 1,200 tablets by mouth daily.     . cholecalciferol (VITAMIN D3) 25 MCG (1000 UT)  tablet Take 4,000 Units by mouth daily.    [name] estradiol (ESTRACE) 1 MG tablet Take 1 tablet (1 mg total) by mouth daily. 30 tablet 3  . meloxicam (MOBIC) 15 MG tablet Take 15 mg by mouth daily.    . methocarbamol (ROBAXIN) 500 MG tablet Take 500 mg by mouth every 8 (eight) hours as needed for muscle spasms.    . methylphenidate (CONCERTA) 54 MG PO CR tablet Take 1 tablet (54 mg total) by mouth every morning. 30 tablet 0  . nitroGLYCERIN (NITROSTAT) 0.4 MG SL tablet Place 1 tablet (0.4 mg total) under the tongue every 5 (five) minutes as needed for chest pain. 25 tablet 2  . NP  THYROID 60 MG tablet Take 1 tablet (60 mg total) by mouth daily before breakfast. 30 tablet 3  . omega-3 acid ethyl esters (LOVAZA) 1 g capsule Take 2 capsules (2 g total) by mouth 2 (two) times daily. 120 capsule 11  . pravastatin (PRAVACHOL) 20 MG tablet Take up to 1 tablet daily as tolerated. 30 tablet 11  . pregabalin (LYRICA) 75 MG capsule Take 75 mg by mouth 2 (two) times daily.    . progesterone (PROMETRIUM) 200 MG capsule Take 2 capsules (400 mg total) by mouth daily. 60 capsule 3  . Testosterone 1.62 % GEL Place 5 mg onto the skin daily. 30 g 1  . traMADol (ULTRAM) 50 MG tablet Take 0.5-1 tablets (25-50 mg total) by mouth every 8 (eight) hours as needed for severe pain (back). 90 tablet 1  . Vilazodone HCl (VIIBRYD) 20 MG TABS Take 1 tablet (20 mg total) by mouth daily at 12 noon. 30 tablet 1  . vitamin E 400 UNIT capsule Take 800 Units by mouth daily.      No current facility-administered medications for this visit.    Allergies:   Clarithromycin, Other, Bactrim [sulfamethoxazole-trimethoprim], Morphine, Morphine and related, Ceclor [cefaclor], Codeine, Cymbalta [duloxetine hcl], Erythromycin, Erythromycin base, Levofloxacin, Loratadine, Nitrofurantoin, Propoxyphene, Propoxyphene n-acetaminophen, and Zithromax [azithromycin]    Social History:  The patient  reports that she has never smoked. She has never used smokeless tobacco. She reports current alcohol use. She reports that she does not use drugs.   Family History:  The patient's family history includes ADD / ADHD in an other family member; Coronary artery disease in an other family member; Heart attack in her mother; Heart disease in her mother; Prostate cancer in her father.    ROS:  Please see the history of present illness.   Otherwise, review of systems are positive for none. All other systems are reviewed and negative.    PHYSICAL EXAM: VS:  BP 98/60   Pulse 79   Ht 4\' 11"  (1.499 m)   Wt 117 lb (53.1 kg)   LMP  01/08/1999   SpO2 97%   BMI 23.63 kg/m  , BMI Body mass index is 23.63 kg/m.   General: Well developed, well nourished, NAD Lungs:Clear to ausculation bilaterally. Breathing is unlabored. Cardiovascular: RRR with S1 S2. No murmurs Extremities: No edema. Radial pulses 2+ bilaterally Neuro: Alert and oriented. No focal deficits. No facial asymmetry. MAE spontaneously. Psych: Responds to questions appropriately with normal affect.     EKG:  EKG is ordered today. The ekg ordered today demonstrates NSR with HR 79bpm and no acute change when compared to prior tracings   Recent Labs: 04/10/2020: ALT 11; BUN 19; Creat 0.76; Potassium 4.4; Sodium 135; TSH 2.29    Lipid Panel    Component  Value Date/Time   CHOL 223 (H) 06/11/2020 1034   CHOL 259 (H) 06/13/2015 1700   TRIG 61 06/11/2020 1034   TRIG 100 06/13/2015 1700   HDL 119 06/11/2020 1034   HDL 103 06/13/2015 1700   CHOLHDL 1.9 06/11/2020 1034   CHOLHDL 2.8 04/10/2020 1431   VLDL 12.8 09/16/2019 1200   LDLCALC 93 06/11/2020 1034   LDLCALC 118 (H) 04/10/2020 1431   LDLCALC 136 (H) 06/13/2015 1700   LDLDIRECT 120.7 08/04/2012 1607      Wt Readings from Last 3 Encounters:  06/13/20 117 lb (53.1 kg)  05/31/20 115 lb 6.4 oz (52.3 kg)  05/28/20 114 lb 6.4 oz (51.9 kg)    Other studies Reviewed: Additional studies/ records that were reviewed today include:  Review of the above records demonstrates:   Stress test 06/17/2018:    Nuclear stress EF: 80%.  Blood pressure demonstrated a normal response to exercise.  There was no ST segment deviation noted during stress.  The study is normal.  This is a low risk study.  The left ventricular ejection fraction is hyperdynamic (>65%).   Normal resting and stress perfusion. No ischemia or infarction EF 80%   ASSESSMENT AND PLAN:  1.  CAD: -Plan to continue with risk modification -Denies anginal symptoms -Continue statin with hopes to up titrate to daily dosing as  tolerated -No ASA due to intolerance  2.  HLD: -Last LDL, 93 (previously110) with an HDL at 119 and a total cholesterol at 223 (previoulsy 235) -On low-dose statin taken twice per week secondary to muscle pain>>attempting to up titrate with goal to daily dosing -PCSK9 inhibitor therapy was recommended on last office visit>>not covered under insurance. Discussed patient assistance however she deferred.  -Recheck lipid panel prior to next OV  3.  Bilateral carotid bruits: -No neurological changes -Last carotid artery duplex 01/06/2017 which showed less than 40% bilaterally -Will re check prior to next OV  4.  Preoperative assessment: -EKG and recent stress testing stable with no evidence of ischemia -The patient does not have any unstable cardiac conditions. Upon evaluation today, she can achieve 4 METs or greater without anginal symptoms. According to [facility] and AHA guidelines, she requires no further cardiac workup prior to her noncardiac surgery and should be at acceptable risk. Her revised cardiac risk index of peri-procedural MI or cardiac arrest following knee replacement surgery is low.    Current medicines are reviewed at length with the patient today.  The patient does not have concerns regarding medicines.  The following changes have been made:  no change  Labs/ tests ordered today include: Lipid panel, carotid dopplers prior to next OV    Orders Placed This Encounter  Procedures  . Lipid panel  . EKG 12-Lead  . VAS US CAROTID    Disposition:   FU with Dr. Tamala Julian in 10 months  Signed, [name], NP  06/13/2020 12:48 PM    [facility], [place], [place]  [address] Phone: [phone]; Fax: [phone]

## 2020-06-13 ENCOUNTER — Ambulatory Visit (INDEPENDENT_AMBULATORY_CARE_PROVIDER_SITE_OTHER): Payer: Medicare Other | Admitting: Cardiology

## 2020-06-13 ENCOUNTER — Encounter: Payer: Self-pay | Admitting: Cardiology

## 2020-06-13 ENCOUNTER — Other Ambulatory Visit: Payer: Self-pay

## 2020-06-13 ENCOUNTER — Ambulatory Visit: Payer: Medicare Other | Admitting: Internal Medicine

## 2020-06-13 VITALS — BP 98/60 | HR 79 | Ht 59.0 in | Wt 117.0 lb

## 2020-06-13 DIAGNOSIS — R0989 Other specified symptoms and signs involving the circulatory and respiratory systems: Secondary | ICD-10-CM

## 2020-06-13 DIAGNOSIS — Z01810 Encounter for preprocedural cardiovascular examination: Secondary | ICD-10-CM

## 2020-06-13 DIAGNOSIS — I2510 Atherosclerotic heart disease of native coronary artery without angina pectoris: Secondary | ICD-10-CM

## 2020-06-13 DIAGNOSIS — E782 Mixed hyperlipidemia: Secondary | ICD-10-CM

## 2020-06-13 DIAGNOSIS — I739 Peripheral vascular disease, unspecified: Secondary | ICD-10-CM | POA: Diagnosis not present

## 2020-06-13 NOTE — Patient Instructions (Signed)
Medication Instructions:  Your physician recommends that you continue on your current medications as directed. Please refer to the Current Medication list given to you today.  *If you need a refill on your cardiac medications before your next appointment, please call your pharmacy*   Lab Work: TO BE DONE AT FOLLOW UP APPOINTMENT: LIPIDS If you have labs (blood work) drawn today and your tests are completely normal, you will receive your results only by: [name] MyChart Message (if you have MyChart) OR . A paper copy in the mail If you have any lab test that is abnormal or we need to change your treatment, we will call you to review the results.   Testing/Procedures: Your physician has requested that you have a carotid duplex. This test is an ultrasound of the carotid arteries in your neck. It looks at blood flow through these arteries that supply the brain with blood. Allow one hour for this exam. There are no restrictions or special instructions.   Follow-Up: At [facility], you and your health needs are our priority.  As part of our continuing mission to provide you with exceptional heart care, we have created designated Provider Care Teams.  These Care Teams include your primary Cardiologist (physician) and Advanced Practice Providers (APPs -  Physician Assistants and Nurse Practitioners) who all work together to provide you with the care you need, when you need it.  We recommend signing up for the patient portal called "MyChart".  Sign up information is provided on this After Visit Summary.  MyChart is used to connect with patients for Virtual Visits (Telemedicine).  Patients are able to view lab/test results, encounter notes, upcoming appointments, etc.  Non-urgent messages can be sent to your provider as well.   To learn more about what you can do with MyChart, go to [email or web address].    Your next appointment:   JANUARY 2023  The format for your next appointment:   In  Person  Provider:   Daneen Schick, MD

## 2020-06-14 ENCOUNTER — Ambulatory Visit: Payer: Medicare Other | Admitting: Internal Medicine

## 2020-06-18 ENCOUNTER — Encounter: Payer: Self-pay | Admitting: Psychiatry

## 2020-06-18 ENCOUNTER — Other Ambulatory Visit: Payer: Self-pay

## 2020-06-18 ENCOUNTER — Ambulatory Visit (INDEPENDENT_AMBULATORY_CARE_PROVIDER_SITE_OTHER): Payer: Medicare Other | Admitting: Psychiatry

## 2020-06-18 VITALS — BP 139/62 | HR 84

## 2020-06-18 DIAGNOSIS — F902 Attention-deficit hyperactivity disorder, combined type: Secondary | ICD-10-CM

## 2020-06-18 DIAGNOSIS — F411 Generalized anxiety disorder: Secondary | ICD-10-CM | POA: Diagnosis not present

## 2020-06-18 DIAGNOSIS — R202 Paresthesia of skin: Secondary | ICD-10-CM

## 2020-06-18 DIAGNOSIS — I2510 Atherosclerotic heart disease of native coronary artery without angina pectoris: Secondary | ICD-10-CM

## 2020-06-18 NOTE — Progress Notes (Signed)
Praise Andres-Gregg [id number] 01-27-1951 70 y.o.  Subjective:   Patient ID:  Courtney [name] is a 70 y.o. (DOB Apr 11, 1951) female.  Chief Complaint:  Chief Complaint  Patient presents with  . Generalized anxiety disorder  . Follow-up    HPI Courtney [name] presents to the office today for follow-up of first visit 03/01/2020 referred by a friend [name].  Was prescribed Viibryd and Vyvanse was changed to Concerta 54 mg to try to reduce the amount of dry mouth.  05/02/2020 appointment with the following noted: Less dry mouth with Concerta but didn't seem to last beyond 3-4 PM. Viibryd 20 mg daily.  Hard to get enough calories at breakfast.  But has been taking both in the morning.  Some benefit with Viibryd. Is there something I can take besides a stimulant, bc fears it is stressing her body.  Dx college exhausted adrenal glands.  It helps.  Asked questions about alternatives to stimulants.  Reduced Concerta to reduce anxiety and see if can achieve, but she thinks it's worse in the evening. Anxiety is still a struggle.  Life crisis moment.  Chronic marital dissatisfaction worse now that both are retired.  Feels like she's in a prison.  Family notices she's stressed.  Has done therapy since age 1 yo.  Has done Hindu meditation without help.  Christian faith disciplines.  Grew up caretaking.  I'm done and I need to change.  Goal is calm down enough to function through the situation. Tingling foot and wants B12 testing. Plan: Switch Viibryd to 20 mg in evening meal to get better absorption.  06/18/2020 appointment with following noted: No difference in anxiety or mood with Viibryd 20 mg daily.  She doesn't want to increase it. Questions about Viibryd and Genesight testing.  Asks about differences between Adderall and MPH.   Missed for 3 days Concerta and took Adderall.    Tired by 4-5 PM and doesn't like that.    Past Psychiatric Medication Trials: Trintellix NR, sertraline?,   Viibryd 20, remote zoloft, Paxil  Vyvanse, Adderall, concerta History Genesight  Review of Systems:  Review of Systems  Neurological: Negative for tremors and weakness.  Psychiatric/Behavioral: Positive for decreased concentration and dysphoric mood. The patient is nervous/anxious.     Medications: I have reviewed the patient's current medications.  Current Outpatient Medications  Medication Sig Dispense Refill  . amoxicillin (AMOXIL) 500 MG tablet Take 1,000 mg by mouth 2 (two) times daily. Dental procedures only    . Calcium Carb-Cholecalciferol (CALCIUM 600+D3 PO) Take 1,200 tablets by mouth daily.     . cholecalciferol (VITAMIN D3) 25 MCG (1000 UT) tablet Take 4,000 Units by mouth daily.    [name] estradiol (ESTRACE) 1 MG tablet Take 1 tablet (1 mg total) by mouth daily. 30 tablet 3  . meloxicam (MOBIC) 15 MG tablet Take 15 mg by mouth daily.    . methocarbamol (ROBAXIN) 500 MG tablet Take 500 mg by mouth every 8 (eight) hours as needed for muscle spasms.    . methylphenidate (CONCERTA) 54 MG PO CR tablet Take 1 tablet (54 mg total) by mouth every morning. 30 tablet 0  . omega-3 acid ethyl esters (LOVAZA) 1 g capsule Take 2 capsules (2 g total) by mouth 2 (two) times daily. 120 capsule 11  . pravastatin (PRAVACHOL) 20 MG tablet Take up to 1 tablet daily as tolerated. 30 tablet 11  . pregabalin (LYRICA) 75 MG capsule Take 75 mg by mouth 2 (two) times daily.    [name]  progesterone (PROMETRIUM) 200 MG capsule Take 2 capsules (400 mg total) by mouth daily. (Patient taking differently: Take 400 mg by mouth daily. Takes 1 tab daily.) 60 capsule 3  . traMADol (ULTRAM) 50 MG tablet Take 0.5-1 tablets (25-50 mg total) by mouth every 8 (eight) hours as needed for severe pain (back). 90 tablet 1  . Vilazodone HCl (VIIBRYD) 20 MG TABS Take 1 tablet (20 mg total) by mouth daily at 12 noon. 30 tablet 1  . vitamin E 400 UNIT capsule Take 800 Units by mouth daily.     [name] ALPRAZolam (XANAX) 1 MG tablet Take  0.5-1 mg by mouth daily as needed. (Patient not taking: Reported on 06/18/2020)    . nitroGLYCERIN (NITROSTAT) 0.4 MG SL tablet Place 1 tablet (0.4 mg total) under the tongue every 5 (five) minutes as needed for chest pain. 25 tablet 2  . NP THYROID 60 MG tablet Take 1 tablet (60 mg total) by mouth daily before breakfast. (Patient not taking: Reported on 06/18/2020) 30 tablet 3  . Testosterone 1.62 % GEL Place 5 mg onto the skin daily. (Patient not taking: Reported on 06/18/2020) 30 g 1   No current facility-administered medications for this visit.    Medication Side Effects: Other: dry mouth  Allergies:  Allergies  Allergen Reactions  . Clarithromycin Nausea Only    REACTION: nausea  . Other Other (See Comments)  . Bactrim [Sulfamethoxazole-Trimethoprim] Nausea And Vomiting  . Morphine Nausea And Vomiting  . Morphine And Related Nausea And Vomiting  . Ceclor [Cefaclor] Nausea And Vomiting    Can take Augmentin ok  . Codeine Nausea And Vomiting  . Cymbalta [Duloxetine Hcl]   . Erythromycin Nausea And Vomiting  . Erythromycin Base Nausea And Vomiting  . Levofloxacin Nausea Only    REACTION: nausea  . Loratadine     REACTION: bruises  . Nitrofurantoin Nausea Only  . Propoxyphene Nausea Only    dizzy  . Propoxyphene N-Acetaminophen Nausea Only    dizzy  . Zithromax [Azithromycin] Nausea And Vomiting    Past Medical History:  Diagnosis Date  . ADD (attention deficit disorder)   . Anxiety   . Celiac disease    possible vs IBS  . Depression    no bipolar per Dr. [name]  . GI problem    [name]  . Hyperlipidemia   . IBS (irritable bowel syndrome)   . Osteoarthritis   . Osteoporosis   . Seasonal allergies 01-08-12   hx. of multiple bronchitis related to this.  . Shoulder pain, right     Family History  Problem Relation Age of Onset  . Heart disease Mother   . Heart attack Mother   . Prostate cancer Father   . Coronary artery disease Other        FH Female 1st degree  relative <60  . ADD / ADHD Other     Social History   Socioeconomic History  . Marital status: Married    Spouse name: Not on file  . Number of children: Not on file  . Years of education: Not on file  . Highest education level: Not on file  Occupational History  . Occupation: Futures trader  Tobacco Use  . Smoking status: Never Smoker  . Smokeless tobacco: Never Used  . Tobacco comment: only social  Vaping Use  . Vaping Use: Never used  Substance and Sexual Activity  . Alcohol use: Yes    Alcohol/week: 0.0 standard drinks    Comment: rare  social   . Drug use: No  . Sexual activity: Yes  Other Topics Concern  . 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   Social Determinants of Health   Financial Resource Strain: Not on file  Food Insecurity: Not on file  Transportation Needs: Not on file  Physical Activity: Not on file  Stress: Not on file  Social Connections: Not on file  Intimate Partner Violence: Not on file    Past Medical History, Surgical history, Social history, and Family history were reviewed and updated as appropriate.   Please see review of systems for further details on the patient's review from today.   Objective:   Physical Exam:  BP 139/62   Pulse 84   LMP 01/08/1999   Physical Exam Constitutional:      General: She is not in acute distress. Musculoskeletal:        General: No deformity.  Neurological:     Mental Status: She is alert and oriented to person, place, and time.     Coordination: Coordination normal.  Psychiatric:        Attention and Perception: Attention and perception normal. She does not perceive auditory or visual hallucinations.        Mood and Affect: Mood is anxious and depressed. Affect is not labile, blunt, angry or inappropriate.        Speech: Speech normal.        Behavior: Behavior is hyperactive.         Thought Content: Thought content normal. Thought content is not paranoid or delusional. Thought content does not include homicidal or suicidal ideation. Thought content does not include homicidal or suicidal plan.        Cognition and Memory: Cognition and memory normal.        Judgment: Judgment normal.     Comments: Insight intact Hyperactive style Angry     Lab Review:     Component Value Date/Time   NA 135 04/10/2020 1431   NA 134 06/14/2018 1444   K 4.4 04/10/2020 1431   CL 98 04/10/2020 1431   CO2 30 04/10/2020 1431   GLUCOSE 95 04/10/2020 1431   BUN 19 04/10/2020 1431   BUN 10 06/14/2018 1444   CREATININE 0.76 04/10/2020 1431   CALCIUM 9.6 04/10/2020 1431   PROT 6.4 04/10/2020 1431   PROT 6.6 10/18/2018 1022   ALBUMIN 4.5 09/16/2019 1200   ALBUMIN 4.3 10/18/2018 1022   AST 17 04/10/2020 1431   ALT 11 04/10/2020 1431   ALKPHOS 57 09/16/2019 1200   BILITOT 0.3 04/10/2020 1431   BILITOT 0.4 10/18/2018 1022   GFRNONAA 79 04/10/2020 1431   GFRAA 91 04/10/2020 1431       Component Value Date/Time   WBC 5.0 06/14/2018 0005   RBC 3.76 (L) 06/14/2018 0005   HGB 12.1 06/14/2018 0005   HCT 35.6 (L) 06/14/2018 0005   PLT 258 06/14/2018 0005   MCV 94.7 06/14/2018 0005   MCH 32.2 06/14/2018 0005   MCHC 34.0 06/14/2018 0005   RDW 11.9 06/14/2018 0005   LYMPHSABS 2.0 06/14/2018 0005   MONOABS 0.7 06/14/2018 0005   EOSABS 0.2 06/14/2018 0005   BASOSABS 0.0 06/14/2018 0005    No results found for: POCLITH, LITHIUM   No results found for: PHENYTOIN, PHENOBARB, VALPROATE, CBMZ   06/04/20 B12 normal at 496  .res Assessment: Plan:    Courtney [name] was seen today  for generalized anxiety disorder and follow-up.  Diagnoses and all orders for this visit:  Generalized anxiety disorder  Attention deficit hyperactivity disorder (ADHD), combined type  Paresthesias  Wean  Viibryd DT lack of effect 10 mg daily for 2 weeks and stop it.  Disc the off-label use of  N-Acetylcysteine at 600 mg daily to help with mild cognitive problems.  It can be combined with a B-complex vitamin as the B-12 and folate have been shown to sometimes enhance the effect.   B12 level checked 496 and normal.  She doesn't want to reduce Concerta.  Disc her ambivalence at length about it.  Alternative to return to Adderall.   . Dialectical Behavior Therapy at [facility]   Call by end of January if there is no benefit and we'll increase the dose of Viibryd.  Supportive therapy around marital crisis.  Reviewed Genesight testing more extensively today in detail for 30 mins.  She has a copy.  FU 8 weeks  Lynder Parents, MD, DFAPA   Please see After Visit Summary for patient specific instructions.  Future Appointments  Date Time Provider [place]  06/20/2020 11:30 AM Doree Albee, MD [place] None  07/04/2020  9:10 AM Plotnikov, Evie Lacks, MD [facility] None  07/06/2020 10:00 AM [facility] PAT 2 [facility] None  09/04/2020  1:20 PM Belva Crome, MD [facility]  05/01/2021  7:45 AM [facility]  05/01/2021  9:00 AM [facility] VASC 4 [facility]    No orders of the defined types were placed in this encounter.   -------------------------------

## 2020-06-19 DIAGNOSIS — M1712 Unilateral primary osteoarthritis, left knee: Secondary | ICD-10-CM | POA: Diagnosis not present

## 2020-06-20 ENCOUNTER — Ambulatory Visit (INDEPENDENT_AMBULATORY_CARE_PROVIDER_SITE_OTHER): Payer: Medicare Other | Admitting: Internal Medicine

## 2020-06-20 ENCOUNTER — Other Ambulatory Visit: Payer: Self-pay

## 2020-06-20 ENCOUNTER — Encounter (INDEPENDENT_AMBULATORY_CARE_PROVIDER_SITE_OTHER): Payer: Self-pay | Admitting: Internal Medicine

## 2020-06-20 VITALS — BP 128/80 | HR 78 | Temp 97.9°F | Resp 18 | Ht 59.0 in | Wt 117.0 lb

## 2020-06-20 DIAGNOSIS — Z780 Asymptomatic menopausal state: Secondary | ICD-10-CM

## 2020-06-20 DIAGNOSIS — I2510 Atherosclerotic heart disease of native coronary artery without angina pectoris: Secondary | ICD-10-CM | POA: Diagnosis not present

## 2020-06-20 DIAGNOSIS — F520 Hypoactive sexual desire disorder: Secondary | ICD-10-CM | POA: Diagnosis not present

## 2020-06-20 NOTE — Progress Notes (Signed)
Metrics: Intervention Frequency ACO  Documented Smoking Status Yearly  Screened one or more times in 24 months  Cessation Counseling or  Active cessation medication Past 24 months  Past 24 months   Guideline developer: UpToDate (See UpToDate for funding source) Date Released: 2014       Wellness Office Visit  Subjective:  Patient ID: Courtney [name], female    DOB: 04-26-1951  Age: 70 y.o. MRN: [id number]  CC: This lady comes in for follow-up regarding bioidentical hormone therapy and thyroid hypofunction. HPI  Since last visit, she has started taking estradiol and progesterone and she seems to be tolerating this reasonably well.  She takes Lyrica in the evening and together with the progesterone, I think she feels very drowsy in the morning.  She has not started taking testosterone cream or NP thyroid that I prescribed last time.  She will start taking this soon. She is due to have knee surgery in about 1 month's time. Past Medical History:  Diagnosis Date  . ADD (attention deficit disorder)   . Anxiety   . Celiac disease    possible vs IBS  . Depression    no bipolar per Dr. [name]  . GI problem    [name]  . Hyperlipidemia   . IBS (irritable bowel syndrome)   . Osteoarthritis   . Osteoporosis   . Seasonal allergies 01-08-12   hx. of multiple bronchitis related to this.  . Shoulder pain, right    Past Surgical History:  Procedure Laterality Date  . EXPLORATORY LAPAROTOMY  01-08-12   due to endometriosis-portions of both ovaries removed  . TONSILLECTOMY    . TOTAL HIP ARTHROPLASTY     right. 2002  . TOTAL HIP ARTHROPLASTY  01/14/2012   Procedure: TOTAL HIP ARTHROPLASTY;  Surgeon: Gearlean Alf, MD;  Location: [facility];  Service: Orthopedics;  Laterality: Left;  [name] VENTRAL HERNIA REPAIR  01-08-12   abdominal     Family History  Problem Relation Age of Onset  . Heart disease Mother   . Heart attack Mother   . Prostate cancer Father   . Coronary artery disease  Other        FH Female 1st degree relative <60  . ADD / ADHD Other     Social History   Social History Narrative   Married for last 62 years.Lives with husband.Retired Insurance account manager.Originally from [place].   Social History   Tobacco Use  . Smoking status: Never Smoker  . Smokeless tobacco: Never Used  . Tobacco comment: only social  Substance Use Topics  . Alcohol use: Yes    Alcohol/week: 0.0 standard drinks    Comment: rare social     Current Meds  Medication Sig  . Calcium Carb-Cholecalciferol (CALCIUM 600+D3 PO) Take 1,200 tablets by mouth daily.   . cholecalciferol (VITAMIN D3) 25 MCG (1000 UT) tablet Take 4,000 Units by mouth daily.  [name] estradiol (ESTRACE) 1 MG tablet Take 1 tablet (1 mg total) by mouth daily.  . meloxicam (MOBIC) 15 MG tablet Take 15 mg by mouth daily.  . methocarbamol (ROBAXIN) 500 MG tablet Take 500 mg by mouth every 8 (eight) hours as needed for muscle spasms.  . methylphenidate (CONCERTA) 54 MG PO CR tablet Take 1 tablet (54 mg total) by mouth every morning.  . NP THYROID 60 MG tablet Take 1 tablet (60 mg total) by mouth daily before breakfast.  . omega-3 acid ethyl esters (LOVAZA) 1 g capsule Take 2 capsules (2 g  total) by mouth 2 (two) times daily.  . pravastatin (PRAVACHOL) 20 MG tablet Take up to 1 tablet daily as tolerated.  . pregabalin (LYRICA) 75 MG capsule Take 75 mg by mouth 2 (two) times daily.  . progesterone (PROMETRIUM) 200 MG capsule Take 2 capsules (400 mg total) by mouth daily. (Patient taking differently: Take 400 mg by mouth daily. Takes 1 tab daily.)  . traMADol (ULTRAM) 50 MG tablet Take 0.5-1 tablets (25-50 mg total) by mouth every 8 (eight) hours as needed for severe pain (back).  . Vilazodone HCl (VIIBRYD) 20 MG TABS Take 1 tablet (20 mg total) by mouth daily at 12 noon.  . vitamin E 400 UNIT capsule Take 800 Units by mouth daily.      [facility] Office Visit from 05/08/2020 in [facility]  PHQ-9  Total Score 1      Objective:   Today's Vitals: BP 128/80 (BP Location: Right Arm, Patient Position: Sitting, Cuff Size: Normal)   Pulse 78   Temp 97.9 F (36.6 C) (Temporal)   Resp 18   Ht 4\' 11"  (1.499 m)   Wt 117 lb (53.1 kg)   LMP 01/08/1999   SpO2 95%   BMI 23.63 kg/m  Vitals with BMI 06/20/2020 06/18/2020 06/13/2020  Height 4\' 11"  - 4\' 11"   Weight 117 lbs - 117 lbs  BMI 97.67 - 34.19  Systolic 379 024 98  Diastolic 80 62 60  Pulse 78 84 79     Physical Exam   She looks systemically well and animated as usual.    Assessment   1. Menopause   2. Hypoactive sexual desire disorder       Tests ordered Orders Placed This Encounter  Procedures  . Estradiol  . Progesterone     Plan: 1. She will continue with estradiol and progesterone and I will check levels today. 2. She will start taking NP thyroid and testosterone cream soon. 3. Follow-up in approximately 3 months to see how she is doing.  I spent 30 minutes with this patient answering questions regarding the hormones that I have already prescribed and that I have discussed with her previously.   No orders of the defined types were placed in this encounter.   Doree Albee, MD

## 2020-06-21 LAB — ESTRADIOL: Estradiol: 49 pg/mL

## 2020-06-21 LAB — PROGESTERONE: Progesterone: 14.6 ng/mL

## 2020-06-27 NOTE — Progress Notes (Signed)
Please call patient and read to her what I have written on labs.Thanks.

## 2020-06-28 ENCOUNTER — Other Ambulatory Visit (INDEPENDENT_AMBULATORY_CARE_PROVIDER_SITE_OTHER): Payer: Self-pay | Admitting: Internal Medicine

## 2020-06-28 MED ORDER — ESTRADIOL 1 MG PO TABS: 1.5000 mg | ORAL_TABLET | Freq: Every day | ORAL | 3 refills | Status: DC

## 2020-06-28 NOTE — Progress Notes (Signed)
Okay,let her know that I have sent a new Rx to [facility] in [place] with new dosing.

## 2020-07-03 ENCOUNTER — Other Ambulatory Visit: Payer: Self-pay

## 2020-07-03 ENCOUNTER — Other Ambulatory Visit: Payer: Self-pay | Admitting: Psychiatry

## 2020-07-03 ENCOUNTER — Telehealth: Payer: Self-pay | Admitting: Psychiatry

## 2020-07-03 DIAGNOSIS — F902 Attention-deficit hyperactivity disorder, combined type: Secondary | ICD-10-CM

## 2020-07-03 DIAGNOSIS — F411 Generalized anxiety disorder: Secondary | ICD-10-CM

## 2020-07-03 MED ORDER — METHYLPHENIDATE HCL ER (OSM) 54 MG PO TBCR: 54.0000 mg | EXTENDED_RELEASE_TABLET | ORAL | 0 refills | Status: DC

## 2020-07-03 MED ORDER — VIIBRYD 20 MG PO TABS
1.0000 | ORAL_TABLET | Freq: Every day | ORAL | 1 refills | Status: DC
Start: 2020-07-03 — End: 2020-08-14

## 2020-07-03 NOTE — Telephone Encounter (Signed)
She was going to wean off Viibryd at last office visit but now reporting she is going to stay on Viibryd 20 mg.

## 2020-07-03 NOTE — Telephone Encounter (Signed)
Pt called and left a message stating that she is still taking the viibryd and was unable to stop the medicine, She will need a refill on the viibryd 20 mg tabs and the concerta 54 mg. Please send to the [facility] in [place]. Call with any questions at [phone]

## 2020-07-04 ENCOUNTER — Ambulatory Visit (INDEPENDENT_AMBULATORY_CARE_PROVIDER_SITE_OTHER): Payer: Medicare Other | Admitting: Internal Medicine

## 2020-07-04 ENCOUNTER — Encounter: Payer: Self-pay | Admitting: Internal Medicine

## 2020-07-04 ENCOUNTER — Other Ambulatory Visit: Payer: Self-pay

## 2020-07-04 VITALS — BP 132/80 | HR 76 | Temp 98.6°F | Wt 114.8 lb

## 2020-07-04 DIAGNOSIS — I2510 Atherosclerotic heart disease of native coronary artery without angina pectoris: Secondary | ICD-10-CM | POA: Diagnosis not present

## 2020-07-04 DIAGNOSIS — I776 Arteritis, unspecified: Secondary | ICD-10-CM | POA: Diagnosis not present

## 2020-07-04 DIAGNOSIS — M069 Rheumatoid arthritis, unspecified: Secondary | ICD-10-CM | POA: Insufficient documentation

## 2020-07-04 DIAGNOSIS — L97501 Non-pressure chronic ulcer of other part of unspecified foot limited to breakdown of skin: Secondary | ICD-10-CM | POA: Diagnosis not present

## 2020-07-04 DIAGNOSIS — Z23 Encounter for immunization: Secondary | ICD-10-CM | POA: Diagnosis not present

## 2020-07-04 DIAGNOSIS — M059 Rheumatoid arthritis with rheumatoid factor, unspecified: Secondary | ICD-10-CM

## 2020-07-04 NOTE — Assessment & Plan Note (Addendum)
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

## 2020-07-04 NOTE — Progress Notes (Signed)
Subjective:  Patient ID: Courtney [name], female    DOB: 25-Feb-1951  Age: 70 y.o. MRN: [id number]  CC: Pain Management (Referral for Pain Management)   HPI Courtney [name] presents for painful lesions on toes started 2 years ago after COVID shots. They never heal...  Outpatient Medications Prior to Visit  Medication Sig Dispense Refill  . ALPRAZolam (XANAX) 1 MG tablet Take 0.5-1 mg by mouth daily as needed.    [name] amoxicillin (AMOXIL) 500 MG tablet Take 2,000 mg by mouth See admin instructions. Take 2000 mg by mouth 1 hour prior to dental appt    . Calcium Carb-Cholecalciferol (CALCIUM 600+D3 PO) Take 1,200 tablets by mouth daily.     . Cholecalciferol (VITAMIN D) 125 MCG (5000 UT) CAPS Take 5,000 Units by mouth daily.    [name] estradiol (ESTRACE) 1 MG tablet Take 1.5 tablets (1.5 mg total) by mouth daily. 45 tablet 3  . meloxicam (MOBIC) 15 MG tablet Take 15 mg by mouth daily.    . methocarbamol (ROBAXIN) 500 MG tablet Take 500 mg by mouth every 8 (eight) hours as needed for muscle spasms.    . methylphenidate (CONCERTA) 54 MG PO CR tablet Take 1 tablet (54 mg total) by mouth every morning. 30 tablet 0  . nitroGLYCERIN (NITROSTAT) 0.4 MG SL tablet Place 1 tablet (0.4 mg total) under the tongue every 5 (five) minutes as needed for chest pain. 25 tablet 2  . NP THYROID 60 MG tablet Take 1 tablet (60 mg total) by mouth daily before breakfast. 30 tablet 3  . omega-3 acid ethyl esters (LOVAZA) 1 g capsule Take 2 capsules (2 g total) by mouth 2 (two) times daily. 120 capsule 11  . pravastatin (PRAVACHOL) 20 MG tablet Take up to 1 tablet daily as tolerated. (Patient taking differently: Take 20 mg by mouth 2 (two) times a week.) 30 tablet 11  . pregabalin (LYRICA) 75 MG capsule Take 75 mg by mouth daily.    . progesterone (PROMETRIUM) 200 MG capsule Take 2 capsules (400 mg total) by mouth daily. (Patient taking differently: Take 200 mg by mouth daily.) 60 capsule 3  . Testosterone 1.62 % GEL  Place 5 mg onto the skin daily. 30 g 1  . traMADol (ULTRAM) 50 MG tablet Take 0.5-1 tablets (25-50 mg total) by mouth every 8 (eight) hours as needed for severe pain (back). 90 tablet 1  . Vilazodone HCl (VIIBRYD) 20 MG TABS Take 1 tablet (20 mg total) by mouth daily at 12 noon. 30 tablet 1  . vitamin E 400 UNIT capsule Take 800 Units by mouth daily.      No facility-administered medications prior to visit.    ROS: Review of Systems  Objective:  BP 132/80 (BP Location: Left Arm)   Pulse 76   Temp 98.6 F (37 C) (Oral)   Wt 114 lb 12.8 oz (52.1 kg)   LMP 01/08/1999   SpO2 99%   BMI 23.19 kg/m   BP Readings from Last 3 Encounters:  07/04/20 132/80  06/20/20 128/80  06/13/20 98/60    Wt Readings from Last 3 Encounters:  07/04/20 114 lb 12.8 oz (52.1 kg)  06/20/20 117 lb (53.1 kg)  06/13/20 117 lb (53.1 kg)    Physical Exam  Today   Historical:            Lab Results  Component Value Date   WBC 5.0 06/14/2018   HGB 12.1 06/14/2018   HCT 35.6 (L) 06/14/2018   PLT  258 06/14/2018   GLUCOSE 95 04/10/2020   CHOL 223 (H) 06/11/2020   TRIG 61 06/11/2020   HDL 119 06/11/2020   LDLDIRECT 120.7 08/04/2012   LDLCALC 93 06/11/2020   ALT 11 04/10/2020   AST 17 04/10/2020   NA 135 04/10/2020   K 4.4 04/10/2020   CL 98 04/10/2020   CREATININE 0.76 04/10/2020   BUN 19 04/10/2020   CO2 30 04/10/2020   TSH 2.29 04/10/2020   INR 0.96 01/08/2012   HGBA1C 5.4 02/11/2018    VAS [place] LOWER EXT ART SEG MULTI (SEGMENTALS & LE RAYNAUDS)  Result Date: 07/15/2019 LOWER EXTREMITY DOPPLER STUDY Indications: Patient presents today with complaints of numbness and pain to the              lower extremities, right worse than left. She states when standing              still she can feel the pain and numbness on the right starting from              her right hip/buttocks area on down to the foot. When she walks she              experiences pain in both legs, from the hip down to  the foot and              significantly at the lateral lower leg from the knee down on the              right. This has been going on for at least 5 years. In the past 6              months she has been experiencing lower lateral pain from the knee              down on the left with walking. The pain in both legs can be intense              where she has to stop and rest. She does have a history of sciatica              of the right side associated with disorder of the lumbar spine. 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 It is difficult to distinguish arterial              claudication symptoms vs neurogenic claudication symptoms based on              patient assessment. High Risk         Hyperlipidemia, past history of smoking, coronary artery Factors:          disease.  Comparison Study: NA Performing Technologist: Sharlett Iles RVT  Examination Guidelines: A complete evaluation includes at minimum, Doppler waveform signals and systolic blood pressure reading at the level of bilateral brachial, anterior tibial, and posterior tibial arteries, when vessel segments are accessible. Bilateral testing is considered an integral part of a complete examination. Photoelectric Plethysmograph (PPG) waveforms and toe systolic pressure readings are included as required and additional duplex testing as needed. Limited examinations for reoccurring indications may be performed as noted.  ABI Findings: +---------+------------------+-----+---------+--------+ Right    Rt Pressure (mmHg)IndexWaveform Comment  +---------+------------------+-----+---------+--------+ Brachial 130                                      +---------+------------------+-----+---------+--------+  CFA                             triphasic         +---------+------------------+-----+---------+--------+ Popliteal                       triphasic          +---------+------------------+-----+---------+--------+ ATA      160               1.20 triphasic         +---------+------------------+-----+---------+--------+ PTA      165               1.24 biphasic          +---------+------------------+-----+---------+--------+ PERO     161               1.21 triphasic         +---------+------------------+-----+---------+--------+ Great Toe130               0.98 Normal            +---------+------------------+-----+---------+--------+ +---------+------------------+-----+---------+-------+ Left     Lt Pressure (mmHg)IndexWaveform Comment +---------+------------------+-----+---------+-------+ Brachial 133                                     +---------+------------------+-----+---------+-------+ CFA                             triphasic        +---------+------------------+-----+---------+-------+ Popliteal                       triphasic        +---------+------------------+-----+---------+-------+ ATA      158               1.19 triphasic        +---------+------------------+-----+---------+-------+ PTA      167               1.26 triphasic        +---------+------------------+-----+---------+-------+ PERO     150               1.13 triphasic        +---------+------------------+-----+---------+-------+ Great Toe127               0.95 Normal           +---------+------------------+-----+---------+-------+ +-------+-----------+-----------+ ABI/TBIToday's ABIToday's TBI +-------+-----------+-----------+ Right  1.24       0.98        +-------+-----------+-----------+ Left   1.26       0.95        +-------+-----------+-----------+  Summary: Right: Resting right ankle-brachial index is within normal range. No evidence of significant right lower extremity arterial disease. The right toe-brachial index is normal. Left: Resting left ankle-brachial index is within normal range. No evidence of  significant left lower extremity arterial disease. The left toe-brachial index is normal.  *See table(s) above for measurements and observations.  Suggest follow up LE Arterial duplex if clinically indicated. Electronically signed by Quay Burow MD on 07/15/2019 at 8:05:47 AM.    Final     Assessment & Plan:     Follow-up: No follow-ups on file.  Walker Kehr, MD

## 2020-07-04 NOTE — Assessment & Plan Note (Signed)
Feet and toes

## 2020-07-04 NOTE — Assessment & Plan Note (Signed)
F/u w/Dr Trudie Reed ?RA related vasculitis - toes

## 2020-07-04 NOTE — Telephone Encounter (Signed)
Rxs sent

## 2020-07-05 DIAGNOSIS — F988 Other specified behavioral and emotional disorders with onset usually occurring in childhood and adolescence: Secondary | ICD-10-CM | POA: Diagnosis not present

## 2020-07-05 DIAGNOSIS — Z6822 Body mass index (BMI) 22.0-22.9, adult: Secondary | ICD-10-CM | POA: Diagnosis not present

## 2020-07-05 DIAGNOSIS — M0579 Rheumatoid arthritis with rheumatoid factor of multiple sites without organ or systems involvement: Secondary | ICD-10-CM | POA: Diagnosis not present

## 2020-07-05 DIAGNOSIS — M2550 Pain in unspecified joint: Secondary | ICD-10-CM | POA: Diagnosis not present

## 2020-07-05 DIAGNOSIS — L97519 Non-pressure chronic ulcer of other part of right foot with unspecified severity: Secondary | ICD-10-CM | POA: Diagnosis not present

## 2020-07-05 DIAGNOSIS — M150 Primary generalized (osteo)arthritis: Secondary | ICD-10-CM | POA: Diagnosis not present

## 2020-07-05 NOTE — Patient Instructions (Addendum)
DUE TO COVID-19 ONLY ONE VISITOR IS ALLOWED TO COME WITH YOU AND STAY IN THE WAITING ROOM ONLY DURING PRE OP AND PROCEDURE DAY OF SURGERY. THE 1 VISITOR  MAY VISIT WITH YOU AFTER SURGERY IN YOUR PRIVATE ROOM DURING VISITING HOURS ONLY!  YOU NEED TO HAVE A COVID 19 TEST ON: 07/12/20 @ 9:00 AM , THIS TEST MUST BE DONE BEFORE SURGERY,  COVID TESTING SITE [place] [id number], IT IS ON THE RIGHT GOING OUT [address] APPROXIMATELY  2 MINUTES PAST [employer] ON THE RIGHT. ONCE YOUR COVID TEST IS COMPLETED,  PLEASE BEGIN THE QUARANTINE INSTRUCTIONS AS OUTLINED IN YOUR HANDOUT.                Courtney [name]    Your procedure is scheduled on: 07/16/20   Report to [facility] Main  Entrance   Report to admitting at: 6:00 AM     Call this number if you have problems the morning of surgery [phone]    Remember:  NO SOLID FOOD AFTER MIDNIGHT THE NIGHT PRIOR TO SURGERY. NOTHING BY MOUTH EXCEPT CLEAR LIQUIDS UNTIL: 5:20 AM . PLEASE FINISH ENSURE DRINK PER SURGEON ORDER  WHICH NEEDS TO BE COMPLETED AT: 5:20 AM .  CLEAR LIQUID DIET  Foods Allowed                                                                     Foods Excluded  Coffee and tea, regular and decaf                             liquids that you cannot  Plain Jell-O any favor except red or purple                                           see through such as: Fruit ices (not with fruit pulp)                                     milk, soups, orange juice  Iced Popsicles                                    All solid food Carbonated beverages, regular and diet                                    Cranberry, grape and apple juices Sports drinks like Gatorade Lightly seasoned clear broth or consume(fat free) Sugar, honey syrup  Sample Menu Breakfast                                Lunch  Supper Cranberry juice                    Beef broth                             Chicken broth Jell-O                                     Grape juice                           Apple juice Coffee or tea                        Jell-O                                      Popsicle                                                Coffee or tea                        Coffee or tea  _____________________________________________________________________   BRUSH YOUR TEETH MORNING OF SURGERY AND RINSE YOUR MOUTH OUT, NO CHEWING GUM CANDY OR MINTS.    Take these medicines the morning of surgery with A SIP OF WATER: estradiol,NP thyroid,progesterone,pregabalin,vilazodone,methylphenidate.Alprazolam as needed.                               You may not have any metal on your body including hair pins and              piercings  Do not wear jewelry, make-up, lotions, powders or perfumes, deodorant             Do not wear nail polish on your fingernails.  Do not shave  48 hours prior to surgery.    Do not bring valuables to the hospital. [name]  Contacts, dentures or bridgework may not be worn into surgery.  Leave suitcase in the car. After surgery it may be brought to your room.     Patients discharged the day of surgery will not be allowed to drive home. IF YOU ARE HAVING SURGERY AND GOING HOME THE SAME DAY, YOU MUST HAVE AN ADULT TO DRIVE YOU HOME AND BE WITH YOU FOR 24 HOURS. YOU MAY GO HOME BY TAXI OR UBER OR ORTHERWISE, BUT AN ADULT MUST ACCOMPANY YOU HOME AND STAY WITH YOU FOR 24 HOURS.  Name and phone number of your driver:  Special Instructions: N/A              Please read over the following fact sheets you were given: _____________________________________________________________________          [facility] - Preparing for Surgery Before surgery, you can play an important role.  Because skin is not sterile, your skin needs to be as free of germs as  possible.  You can reduce the number of germs on your skin by washing  with CHG (chlorahexidine gluconate) soap before surgery.  CHG is an antiseptic cleaner which kills germs and bonds with the skin to continue killing germs even after washing. Please DO NOT use if you have an allergy to CHG or antibacterial soaps.  If your skin becomes reddened/irritated stop using the CHG and inform your nurse when you arrive at Short Stay. Do not shave (including legs and underarms) for at least 48 hours prior to the first CHG shower.  You may shave your face/neck. Please follow these instructions carefully:  1.  Shower with CHG Soap the night before surgery and the  morning of Surgery.  2.  If you choose to wash your hair, wash your hair first as usual with your  normal  shampoo.  3.  After you shampoo, rinse your hair and body thoroughly to remove the  shampoo.                           4.  Use CHG as you would any other liquid soap.  You can apply chg directly  to the skin and wash                       Gently with a scrungie or clean washcloth.  5.  Apply the CHG Soap to your body ONLY FROM THE NECK DOWN.   Do not use on face/ open                           Wound or open sores. Avoid contact with eyes, ears mouth and genitals (private parts).                       Wash face,  Genitals (private parts) with your normal soap.             6.  Wash thoroughly, paying special attention to the area where your surgery  will be performed.  7.  Thoroughly rinse your body with warm water from the neck down.  8.  DO NOT shower/wash with your normal soap after using and rinsing off  the CHG Soap.                9.  Pat yourself dry with a clean towel.            10.  Wear clean pajamas.            11.  Place clean sheets on your bed the night of your first shower and do not  sleep with pets. Day of Surgery : Do not apply any lotions/deodorants the morning of surgery.  Please wear clean clothes to the hospital/surgery center.  FAILURE TO FOLLOW THESE INSTRUCTIONS MAY RESULT IN THE  CANCELLATION OF YOUR SURGERY PATIENT SIGNATURE_________________________________  NURSE SIGNATURE__________________________________  ________________________________________________________________________   [name]  An incentive spirometer is a tool that can help keep your lungs clear and active. This tool measures how well you are filling your lungs with each breath. Taking long deep breaths may help reverse or decrease the chance of developing breathing (pulmonary) problems (especially infection) following:  A long period of time when you are unable to move or be active. BEFORE THE PROCEDURE   If the spirometer includes an indicator to show your best effort, your nurse or respiratory  therapist will set it to a desired goal.  If possible, sit up straight or lean slightly forward. Try not to slouch.  Hold the incentive spirometer in an upright position. INSTRUCTIONS FOR USE  1. Sit on the edge of your bed if possible, or sit up as far as you can in bed or on a chair. 2. Hold the incentive spirometer in an upright position. 3. Breathe out normally. 4. Place the mouthpiece in your mouth and seal your lips tightly around it. 5. Breathe in slowly and as deeply as possible, raising the piston or the ball toward the top of the column. 6. Hold your breath for 3-5 seconds or for as long as possible. Allow the piston or ball to fall to the bottom of the column. 7. Remove the mouthpiece from your mouth and breathe out normally. 8. Rest for a few seconds and repeat Steps 1 through 7 at least 10 times every 1-2 hours when you are awake. Take your time and take a few normal breaths between deep breaths. 9. The spirometer may include an indicator to show your best effort. Use the indicator as a goal to work toward during each repetition. 10. After each set of 10 deep breaths, practice coughing to be sure your lungs are clear. If you have an incision (the cut made at the time of surgery),  support your incision when coughing by placing a pillow or rolled up towels firmly against it. Once you are able to get out of bed, walk around indoors and cough well. You may stop using the incentive spirometer when instructed by your caregiver.  RISKS AND COMPLICATIONS  Take your time so you do not get dizzy or light-headed.  If you are in pain, you may need to take or ask for pain medication before doing incentive spirometry. It is harder to take a deep breath if you are having pain. AFTER USE  Rest and breathe slowly and easily.  It can be helpful to keep track of a log of your progress. Your caregiver can provide you with a simple table to help with this. If you are using the spirometer at home, follow these instructions: [place] IF:   You are having difficultly using the spirometer.  You have trouble using the spirometer as often as instructed.  Your pain medication is not giving enough relief while using the spirometer.  You develop fever of 100.5 F (38.1 C) or higher. SEEK IMMEDIATE MEDICAL CARE IF:   You cough up bloody sputum that had not been present before.  You develop fever of 102 F (38.9 C) or greater.  You develop worsening pain at or near the incision site. MAKE SURE YOU:   Understand these instructions.  Will watch your condition.  Will get help right away if you are not doing well or get worse. Document Released: 08/25/2006 Document Revised: 07/07/2011 Document Reviewed: 10/26/2006 [facility] Patient Information 2014 [place], [place].   ________________________________________________________________________

## 2020-07-05 NOTE — H&P (Signed)
TOTAL KNEE ADMISSION H&P  Patient is being admitted for left total knee arthroplasty.  Subjective:  Chief Complaint: Left knee pain.  HPI: Courtney [name], 70 y.o. female has a history of pain and functional disability in the left knee due to arthritis and has failed non-surgical conservative treatments for greater than 12 weeks to include NSAID's and/or analgesics and activity modification. Onset of symptoms was gradual, starting several years ago with gradually worsening course since that time. The patient noted no past surgery on the left knee.  Patient currently rates pain in the left knee at 5 out of 10 with activity. Patient has worsening of pain with activity and weight bearing and pain that interferes with activities of daily living. AP and lateral of the bilateral knees dated 02/17/2020 demonstrate bone-on-bone arthritis in the medial compartment with varus deformity of the left knee also with some patellofemoral narrowing. Right knee has some medial narrowing. There is no active infection.  Patient Active Problem List   Diagnosis Date Noted  . Vasculitis ([place]) 07/04/2020  . RA (rheumatoid arthritis) ([place]) 07/04/2020  . Preop exam for internal medicine 06/03/2020  . Menopause 05/31/2020  . Allergic contact dermatitis 05/28/2020  . Degenerative disc disease, cervical 06/15/2019  . Acquired leg length discrepancy 06/15/2019  . Leg pain 06/14/2019  . Toe ulcer ([place]) 06/14/2019  . Mixed hyperlipidemia 02/04/2019  . 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  . Spinal stenosis, lumbar region, 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  . Sciatica of right side associated with disorder of lumbar spine 02/26/2017  . CAD in native artery 12/23/2016  . Abdominal pain, epigastric 05/09/2015  . Dry skin dermatitis 08/04/2012  . Constipation - functional 08/04/2012  . History of hypothyroidism 08/04/2012  . Otitis media, serous 12/25/2010  . OSTEOARTHRITIS, HIPS, BILATERAL 06/20/2010   . ANXIETY 05/03/2009  . TOBACCO USE, QUIT 05/03/2009  . ONYCHOMYCOSIS 06/28/2008  . Actinic keratosis 06/28/2008  . Bilateral carotid bruits 06/28/2008  . Attention deficit disorder 05/17/2007  . Irritable bowel syndrome 05/17/2007  . Dyslipidemia 02/28/2007  . Depression 02/28/2007  . Osteoarthritis 02/28/2007    Past Medical History:  Diagnosis Date  . ADD (attention deficit disorder)   . Anxiety   . Celiac disease    possible vs IBS  . Depression    no bipolar per Dr. [name]  . GI problem    [name]  . Hyperlipidemia   . IBS (irritable bowel syndrome)   . Osteoarthritis   . Osteoporosis   . Seasonal allergies 01-08-12   hx. of multiple bronchitis related to this.  . Shoulder pain, right     Past Surgical History:  Procedure Laterality Date  . EXPLORATORY LAPAROTOMY  01-08-12   due to endometriosis-portions of both ovaries removed  . TONSILLECTOMY    . TOTAL HIP ARTHROPLASTY     right. 2002  . TOTAL HIP ARTHROPLASTY  01/14/2012   Procedure: TOTAL HIP ARTHROPLASTY;  Surgeon: Gearlean Alf, MD;  Location: [facility];  Service: Orthopedics;  Laterality: Left;  [name] VENTRAL HERNIA REPAIR  01-08-12   abdominal    Prior to Admission medications   Medication Sig Start Date End Date Taking? Authorizing Provider  amoxicillin (AMOXIL) 500 MG tablet Take 2,000 mg by mouth See admin instructions. Take 2000 mg by mouth 1 hour prior to dental appt 04/05/20  Yes [provider]  Calcium Carb-Cholecalciferol (CALCIUM 600+D3 PO) Take 1,200 tablets by mouth daily.    Yes [provider]  Cholecalciferol (VITAMIN D) 125 MCG (5000 UT)  CAPS Take 5,000 Units by mouth daily.   Yes [provider]  meloxicam (MOBIC) 15 MG tablet Take 15 mg by mouth daily.   Yes [provider]  methocarbamol (ROBAXIN) 500 MG tablet Take 500 mg by mouth every 8 (eight) hours as needed for muscle spasms.   Yes [provider]  nitroGLYCERIN (NITROSTAT) 0.4 MG SL tablet  Place 1 tablet (0.4 mg total) under the tongue every 5 (five) minutes as needed for chest pain. 06/14/18 06/22/19 Yes [name] D, NP  NP THYROID 60 MG tablet Take 1 tablet (60 mg total) by mouth daily before breakfast. 05/08/20  Yes Gosrani, Nimish C, MD  omega-3 acid ethyl esters (LOVAZA) 1 g capsule Take 2 capsules (2 g total) by mouth 2 (two) times daily. 09/14/19  Yes Plotnikov, Evie Lacks, MD  pravastatin (PRAVACHOL) 20 MG tablet Take up to 1 tablet daily as tolerated. Patient taking differently: Take 20 mg by mouth 2 (two) times a week. 05/14/20  Yes Belva Crome, MD  pregabalin (LYRICA) 75 MG capsule Take 75 mg by mouth daily. 04/30/20  Yes [provider]  progesterone (PROMETRIUM) 200 MG capsule Take 2 capsules (400 mg total) by mouth daily. Patient taking differently: Take 200 mg by mouth daily. 05/23/20  Yes Gosrani, Nimish C, MD  traMADol (ULTRAM) 50 MG tablet Take 0.5-1 tablets (25-50 mg total) by mouth every 8 (eight) hours as needed for severe pain (back). 12/30/19  Yes Plotnikov, Evie Lacks, MD  vitamin E 400 UNIT capsule Take 800 Units by mouth daily.    Yes [provider]  ALPRAZolam Duanne Moron) 1 MG tablet Take 0.5-1 mg by mouth daily as needed. 12/16/19   [provider]  estradiol (ESTRACE) 1 MG tablet Take 1.5 tablets (1.5 mg total) by mouth daily. 06/28/20   Doree Albee, MD  methylphenidate (CONCERTA) 54 MG PO CR tablet Take 1 tablet (54 mg total) by mouth every morning. 07/03/20   Cottle, Billey Co., MD  Testosterone 1.62 % GEL Place 5 mg onto the skin daily. 05/08/20   Doree Albee, MD  Vilazodone HCl (VIIBRYD) 20 MG TABS Take 1 tablet (20 mg total) by mouth daily at 12 noon. 07/03/20   Cottle, Billey Co., MD    Allergies  Allergen Reactions  . Clarithromycin Nausea Only  . Other Other (See Comments)  . Bactrim [Sulfamethoxazole-Trimethoprim] Nausea And Vomiting  . Morphine Nausea And Vomiting    Other reaction(s): Nausea/Vomiting  . Morphine  And Related Nausea And Vomiting  . Azithromycin Nausea And Vomiting and Nausea Only  . Ceclor [Cefaclor] Nausea And Vomiting    Can take Augmentin ok  . Codeine Nausea And Vomiting  . Cymbalta [Duloxetine Hcl]   . Erythromycin Nausea And Vomiting    Other reaction(s): Nausea/Vomiting  . Erythromycin Base Nausea And Vomiting  . Levofloxacin Nausea Only    REACTION: nausea  . Loratadine     REACTION: bruises  . Nitrofurantoin Nausea Only  . Propoxyphene Nausea Only    dizzy  . Propoxyphene N-Acetaminophen Nausea Only    dizzy    Social History   Socioeconomic History  . Marital status: Married    Spouse name: Not on file  . Number of children: Not on file  . Years of education: Not on file  . Highest education level: Not on file  Occupational History  . Occupation: Futures trader  Tobacco Use  . Smoking status: Never Smoker  . Smokeless tobacco: Never  Used  . Tobacco comment: only social  Vaping Use  . Vaping Use: Never used  Substance and Sexual Activity  . Alcohol use: Yes    Alcohol/week: 0.0 standard drinks    Comment: rare social   . Drug use: No  . Sexual activity: Yes  Other Topics Concern  . 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   Social Determinants of Health   Financial Resource Strain: Not on file  Food Insecurity: Not on file  Transportation Needs: Not on file  Physical Activity: Not on file  Stress: Not on file  Social Connections: Not on file  Intimate Partner Violence: Not on file    Tobacco Use: Low Risk   . Smoking Tobacco Use: Never Smoker  . Smokeless Tobacco Use: Never Used   Social History   Substance and Sexual Activity  Alcohol Use Yes  . Alcohol/week: 0.0 standard drinks   Comment: rare social     Family History  Problem Relation Age of Onset  . Heart disease Mother   . Heart attack Mother   . Prostate cancer  Father   . Coronary artery disease Other        FH Female 1st degree relative <60  . ADD / ADHD Other     ROS: Constitutional Constitutional: no fever, no significant weight gain, no significant weight loss  HEENT Eyes: no dry eyes, no irritation, no vision change, no sore throat  Cardiovascular Cardiovascular: no chest pain, no palpitations  Respiratory Respiratory: no cough, no shortness of breath, No COPD  Gastrointestinal Gastrointestinal: no vomiting, no diarrhea, not vomiting blood  Genitourinary Genitourinary: no blood in urine, no difficulty urinating  Musculoskeletal Musculoskeletal: no swelling in Joints, Joint Pain, Rheumatoid Arthritis, height Loss 2 inches or greater  Integumentary Skin: no rashes, no varicose veins  Neurologic Neurologic: no seizures, no dizziness, no difficulty with balance, numbness  Endocrine temperature intolerance (normal) to heat  Hematologic/Lymphatic Hematologic/Lymphatic no swollen glands, no bruising  Objective:  Physical Exam: Well nourished and well developed.  General: Alert and oriented x3, cooperative and pleasant, no acute distress.  Head: normocephalic, atraumatic, neck supple.  Eyes: EOMI.  Respiratory: breath sounds clear in all fields, no wheezing, rales, or rhonchi. Cardiovascular: Regular rate and rhythm, no murmurs, gallops or rubs.  Abdomen: non-tender to palpation and soft, normoactive bowel sounds. Musculoskeletal:  Left Knee Exam:  Moderate effusion. Varus deformity.  Range of motion is 5 to 125 degrees.  Crepitus on range of motion of the knee.  Positive medial joint line tenderness.  No lateral joint line tenderness.  Stable knee.    Calves soft and nontender. Motor function intact in LE. Strength 5/5 LE bilaterally. Neuro: Distal pulses 2+. Sensation to light touch intact in LE.  Vital signs in last 24 hours:    Imaging Review AP and lateral of the bilateral knees dated 02/17/2020  demonstrate bone-on-bone arthritis in the medial compartment with varus deformity of the left knee also with some patellofemoral narrowing. Right knee has some medial narrowing.   Assessment/Plan:  End stage arthritis, left knee   The patient history, physical examination, clinical judgment of the provider and imaging studies are consistent with end stage degenerative joint disease of the left knee and total knee arthroplasty is deemed medically necessary. The treatment options including medical management, injection therapy arthroscopy and arthroplasty were discussed at length. The risks and benefits of total knee arthroplasty  were presented and reviewed. The risks due to aseptic loosening, infection, stiffness, patella tracking problems, thromboembolic complications and other imponderables were discussed. The patient acknowledged the explanation, agreed to proceed with the plan and consent was signed. Patient is being admitted for inpatient treatment for surgery, pain control, PT, OT, prophylactic antibiotics, VTE prophylaxis, progressive ambulation and ADLs and discharge planning. The patient is planning to be discharged home.   Patient's anticipated LOS is less than 2 midnights, meeting these requirements: - Lives within 1 hour of care - Has a competent adult at home to recover with post-op recover - NO history of  - Chronic pain requiring opiods  - Diabetes  - Coronary Artery Disease  - Heart failure  - Heart attack  - Stroke  - DVT/VTE  - Cardiac arrhythmia  - Respiratory Failure/COPD  - Renal failure  - Anemia  - Advanced Liver disease   Therapy Plans: [facility] Disposition: Home with husband Planned DVT Prophylaxis: Aspirin 325mg  DME Needed: Ice Machine, Environmental consultant, 3-in-1 PCP: Walker Kehr, MD (Clearance received) TXA: IV Allergies: Codeine (morphine causes significant HA/vomiting) Anesthesia Concerns: Describes significant reactions of being out of it for  weeks, feelings of fogginess following shoulder surgery.  BMI: 22.9 Last HgbA1c: N/A  Pharmacy: [facility] [place]     - Patient was instructed on what medications to stop prior to surgery. - Follow-up visit in 2 weeks with Dr. Wynelle Link - Begin physical therapy following surgery - Pre-operative lab work as pre-surgical testing - Prescriptions will be provided in hospital at time of discharge  Fenton Foy, [facility], PA-C Orthopedic Surgery [facility] [place]

## 2020-07-06 ENCOUNTER — Other Ambulatory Visit: Payer: Self-pay

## 2020-07-06 ENCOUNTER — Encounter (HOSPITAL_COMMUNITY)
Admission: RE | Admit: 2020-07-06 | Discharge: 2020-07-06 | Disposition: A | Discharge status: Home / Self Care | Payer: Medicare Other | Source: Ambulatory Visit | Attending: Orthopedic Surgery | Admitting: Orthopedic Surgery

## 2020-07-06 ENCOUNTER — Encounter (HOSPITAL_COMMUNITY): Payer: Self-pay

## 2020-07-06 DIAGNOSIS — Z01812 Encounter for preprocedural laboratory examination: Secondary | ICD-10-CM | POA: Insufficient documentation

## 2020-07-06 HISTORY — DX: Other complications of anesthesia, initial encounter: T88.59XA

## 2020-07-06 HISTORY — DX: Other specified postprocedural states: Z98.890

## 2020-07-06 HISTORY — DX: Atherosclerotic heart disease of native coronary artery without angina pectoris: I25.10

## 2020-07-06 HISTORY — DX: Rheumatoid arthritis, unspecified: M06.9

## 2020-07-06 HISTORY — DX: Non-celiac gluten sensitivity: K90.41

## 2020-07-06 HISTORY — DX: Other specified postprocedural states: R11.2

## 2020-07-06 LAB — COMPREHENSIVE METABOLIC PANEL
ALT: 13 U/L (ref 0–44)
AST: 21 U/L (ref 15–41)
Albumin: 3.9 g/dL (ref 3.5–5.0)
Alkaline Phosphatase: 49 U/L (ref 38–126)
Anion gap: 11 (ref 5–15)
BUN: 14 mg/dL (ref 8–23)
CO2: 21 mmol/L — ABNORMAL LOW (ref 22–32)
Calcium: 9.3 mg/dL (ref 8.9–10.3)
Chloride: 100 mmol/L (ref 98–111)
Creatinine, Ser: 0.66 mg/dL (ref 0.44–1.00)
GFR, Estimated: >60 mL/min (ref >60)
Glucose, Bld: 101 mg/dL — ABNORMAL HIGH (ref 70–99)
Potassium: 4.9 mmol/L (ref 3.5–5.1)
Sodium: 132 mmol/L — ABNORMAL LOW (ref 135–145)
Total Bilirubin: 0.5 mg/dL (ref 0.3–1.2)
Total Protein: 6.8 g/dL (ref 6.5–8.1)

## 2020-07-06 LAB — CBC
HCT: 42.8 % (ref 36.0–46.0)
Hemoglobin: 13.7 g/dL (ref 12.0–15.0)
MCH: 31.2 pg (ref 26.0–34.0)
MCHC: 32 g/dL (ref 30.0–36.0)
MCV: 97.5 fL (ref 80.0–100.0)
Platelets: 295 10*3/uL (ref 150–400)
RBC: 4.39 MIL/uL (ref 3.87–5.11)
RDW: 12.5 % (ref 11.5–15.5)
WBC: 4.5 10*3/uL (ref 4.0–10.5)
nRBC: 0 % (ref 0.0–0.2)

## 2020-07-06 LAB — SURGICAL PCR SCREEN
MRSA, PCR: NEGATIVE
Staphylococcus aureus: POSITIVE — AB

## 2020-07-06 LAB — PROTIME-INR
INR: 1 (ref 0.8–1.2)
Prothrombin Time: 12.5 seconds (ref 11.4–15.2)

## 2020-07-06 LAB — APTT: aPTT: 33 seconds (ref 24–36)

## 2020-07-06 NOTE — Progress Notes (Signed)
PCR: STAPH POSITIVE.

## 2020-07-06 NOTE — Progress Notes (Signed)
COVID Vaccine Completed: Yes Date COVID Vaccine completed: 06/13/19 COVID vaccine manufacturer:  Moderna    PCP - Dr. Tyrone Apple Plotnikov. LOV: 07/04/20. Clearance: Chart. Cardiologist - Dr. Daneen Schick.  Clearance: [name]: 06/13/20: EPIC   Chest x-ray -  EKG - 06/13/20 Stress Test -  ECHO -  Cardiac Cath -  Pacemaker/ICD device last checked:  Sleep Study -  CPAP -   Fasting Blood Sugar -  Checks Blood Sugar _____ times a day  Blood Thinner Instructions: Aspirin Instructions: Last Dose:  Anesthesia review: Hx: CAD  Patient denies shortness of breath, fever, cough and chest pain at PAT appointment   Patient verbalized understanding of instructions that were given to them at the PAT appointment. Patient was also instructed that they will need to review over the PAT instructions again at home before surgery.

## 2020-07-09 NOTE — Anesthesia Preprocedure Evaluation (Addendum)
Anesthesia Evaluation  Patient identified by MRN, date of birth, ID band Patient awake    Reviewed: Allergy & Precautions, NPO status , Patient's Chart, lab work & pertinent test results  History of Anesthesia Complications (+) PONV and history of anesthetic complications  Airway Mallampati: II  TM Distance: >3 FB Neck ROM: Full    Dental no notable dental hx.    Pulmonary neg pulmonary ROS,    Pulmonary exam normal breath sounds clear to auscultation       Cardiovascular + CAD  Normal cardiovascular exam Rhythm:Regular Rate:Normal     Neuro/Psych PSYCHIATRIC DISORDERS Anxiety Depression    GI/Hepatic negative GI ROS, Neg liver ROS, IBS (irritable bowel syndrome)   Endo/Other  Hypothyroidism   Renal/GU negative Renal ROS     Musculoskeletal  (+) Arthritis , Osteoarthritis and Rheumatoid disorders,    Abdominal   Peds  (+) ATTENTION DEFICIT DISORDER WITHOUT HYPERACTIVITY Hematology negative hematology ROS (+) HLD   Anesthesia Other Findings left knee osteoarthritis  Reproductive/Obstetrics                            Anesthesia Physical Anesthesia Plan  ASA: II  Anesthesia Plan: Spinal and Regional   Post-op Pain Management:  Regional for Post-op pain   Induction: Intravenous  PONV Risk Score and Plan: 3 and Ondansetron, Dexamethasone, Midazolam, Propofol infusion and Treatment may vary due to age or medical condition  Airway Management Planned: Simple Face Mask  Additional Equipment:   Intra-op Plan:   Post-operative Plan:   Informed Consent: I have reviewed the patients History and Physical, chart, labs and discussed the procedure including the risks, benefits and alternatives for the proposed anesthesia with the patient or authorized representative who has indicated his/her understanding and acceptance.     Dental advisory given  Plan Discussed with: CRNA  Anesthesia  Plan Comments: (Reviewed PAT note 07/06/2020, Konrad Felix, PA-C)       Anesthesia Quick Evaluation

## 2020-07-09 NOTE — Progress Notes (Signed)
Anesthesia Chart Review   Case: [id number] Date/Time: 07/16/20 0805   Procedure: TOTAL KNEE ARTHROPLASTY (Left Knee) - 44min   Anesthesia type: Choice   Pre-op diagnosis: left knee osteoarthritis   [facility] 10 / [facility]   Surgeons: Gaynelle Arabian, MD      DISCUSSION:69 y.o. never smoker with h/o PONV, RA, CAD, left knee OA scheduled for above procedure 07/16/2020 with Dr. Gaynelle Arabian.   Pt last seen by cardiology 06/13/2020. Per OV note, "-EKG and recent stress testing stable with no evidence of ischemia -The patient does not have any unstable cardiac conditions. Upon evaluation today, shecan achieve 4 METs or greater without anginal symptoms. According to [facility] and AHA guidelines, sherequires no further cardiac workup prior to hernoncardiac surgery and should be at acceptable risk. Herrevised cardiac risk index of peri-procedural MI or cardiac arrest following knee replacement surgeryis low."  Anticipate pt can proceed with planned procedure barring acute status change.   VS: BP 134/79    Pulse 75    Temp 36.7 C (Oral)    Ht 4' 11.5" (1.511 m)    Wt 50.8 kg    LMP 01/08/1999    SpO2 100%    BMI 22.24 kg/m   PROVIDERS: Plotnikov, Evie Lacks, MD is PCP   Daneen Schick, MD is Cardiologist  LABS: Labs reviewed: Acceptable for surgery. (all labs ordered are listed, but only abnormal results are displayed)  Labs Reviewed  SURGICAL PCR SCREEN - Abnormal; Notable for the following components:      Result Value   Staphylococcus aureus POSITIVE (*)    All other components within normal limits  COMPREHENSIVE METABOLIC PANEL - Abnormal; Notable for the following components:   Sodium 132 (*)    CO2 21 (*)    Glucose, Bld 101 (*)    All other components within normal limits  CBC  PROTIME-INR  APTT  TYPE AND SCREEN     IMAGES:   EKG: 06/13/2020 Rate 79 bpm  NSR  CV: Stress Test 06/17/2018  Nuclear stress EF: 80%.  Blood pressure demonstrated a normal response to  exercise.  There was no ST segment deviation noted during stress.  The study is normal.  This is a low risk study.  The left ventricular ejection fraction is hyperdynamic (>65%).   Normal resting and stress perfusion. No ischemia or infarction EF 80%   Past Medical History:  Diagnosis Date   ADD (attention deficit disorder)    Anxiety    Celiac disease    possible vs IBS   Complication of anesthesia    Coronary artery disease    Depression    no bipolar per Dr. [name]   GI problem    Olevia Perches   Gluten intolerance    Hyperlipidemia    IBS (irritable bowel syndrome)    Osteoarthritis    Osteoporosis    PONV (postoperative nausea and vomiting)    Rheumatoid arthritis ([place])    Seasonal allergies 01-08-12   hx. of multiple bronchitis related to this.   Shoulder pain, right     Past Surgical History:  Procedure Laterality Date   EXPLORATORY LAPAROTOMY  01-08-12   due to endometriosis-portions of both ovaries removed   TONSILLECTOMY     TOTAL HIP ARTHROPLASTY     right. 2002   TOTAL HIP ARTHROPLASTY  01/14/2012   Procedure: TOTAL HIP ARTHROPLASTY;  Surgeon: Gearlean Alf, MD;  Location: [facility];  Service: Orthopedics;  Laterality: Left;   TOTAL SHOULDER ARTHROPLASTY  Right    VENTRAL HERNIA REPAIR  01-08-12   abdominal    MEDICATIONS:  ALPRAZolam (XANAX) 1 MG tablet   amoxicillin (AMOXIL) 500 MG tablet   Calcium Carb-Cholecalciferol (CALCIUM 600+D3 PO)   Cholecalciferol (VITAMIN D) 125 MCG (5000 UT) CAPS   estradiol (ESTRACE) 1 MG tablet   meloxicam (MOBIC) 15 MG tablet   methocarbamol (ROBAXIN) 500 MG tablet   methylphenidate (CONCERTA) 54 MG PO CR tablet   nitroGLYCERIN (NITROSTAT) 0.4 MG SL tablet   NP THYROID 60 MG tablet   omega-3 acid ethyl esters (LOVAZA) 1 g capsule   pravastatin (PRAVACHOL) 20 MG tablet   pregabalin (LYRICA) 75 MG capsule   progesterone (PROMETRIUM) 200 MG capsule   Testosterone 1.62 % GEL    traMADol (ULTRAM) 50 MG tablet   Vilazodone HCl (VIIBRYD) 20 MG TABS   vitamin E 400 UNIT capsule   No current facility-administered medications for this encounter.     Konrad Felix, PA-C [facility] [phone]

## 2020-07-10 ENCOUNTER — Other Ambulatory Visit: Payer: Self-pay | Admitting: Internal Medicine

## 2020-07-10 ENCOUNTER — Encounter (INDEPENDENT_AMBULATORY_CARE_PROVIDER_SITE_OTHER): Payer: Self-pay | Admitting: Internal Medicine

## 2020-07-10 NOTE — Telephone Encounter (Signed)
Check [place] registry last filled 04/30/2020. MD is out of the office pls advise on refill.Marland KitchenJohny Chess

## 2020-07-12 ENCOUNTER — Other Ambulatory Visit (HOSPITAL_COMMUNITY)
Admission: RE | Admit: 2020-07-12 | Discharge: 2020-07-12 | Disposition: A | Discharge status: Home / Self Care | Payer: Medicare Other | Source: Ambulatory Visit | Attending: Orthopedic Surgery | Admitting: Orthopedic Surgery

## 2020-07-12 DIAGNOSIS — Z20822 Contact with and (suspected) exposure to covid-19: Secondary | ICD-10-CM | POA: Diagnosis not present

## 2020-07-12 DIAGNOSIS — Z01812 Encounter for preprocedural laboratory examination: Secondary | ICD-10-CM | POA: Insufficient documentation

## 2020-07-12 LAB — SARS CORONAVIRUS 2 (TAT 6-24 HRS): SARS Coronavirus 2: NEGATIVE

## 2020-07-12 NOTE — Telephone Encounter (Signed)
Duplicate msg. See previous email.Marland KitchenJohny [name]

## 2020-07-13 ENCOUNTER — Encounter (INDEPENDENT_AMBULATORY_CARE_PROVIDER_SITE_OTHER): Payer: Self-pay | Admitting: Internal Medicine

## 2020-07-14 ENCOUNTER — Other Ambulatory Visit: Payer: Self-pay | Admitting: Internal Medicine

## 2020-07-14 MED ORDER — MUPIROCIN 2 % EX OINT: TOPICAL_OINTMENT | CUTANEOUS | 0 refills | Status: DC

## 2020-07-15 MED ORDER — BUPIVACAINE LIPOSOME 1.3 % IJ SUSP
20.0000 mL | Freq: Once | INTRAMUSCULAR | Status: DC
  Filled 2020-07-15: qty 20

## 2020-07-16 ENCOUNTER — Encounter (HOSPITAL_COMMUNITY): Payer: Self-pay | Admitting: Orthopedic Surgery

## 2020-07-16 ENCOUNTER — Other Ambulatory Visit: Payer: Self-pay

## 2020-07-16 ENCOUNTER — Ambulatory Visit (HOSPITAL_COMMUNITY): Payer: Medicare Other | Admitting: Anesthesiology

## 2020-07-16 ENCOUNTER — Observation Stay (HOSPITAL_COMMUNITY)
Admission: RE | Admit: 2020-07-16 | Discharge: 2020-07-17 | Disposition: A | Discharge status: Home / Self Care | Payer: Medicare Other | Attending: Orthopedic Surgery | Admitting: Orthopedic Surgery

## 2020-07-16 ENCOUNTER — Encounter (HOSPITAL_COMMUNITY)
Admission: RE | Disposition: A | Discharge status: Home / Self Care | Payer: Self-pay | Source: Home / Self Care | Attending: Orthopedic Surgery

## 2020-07-16 ENCOUNTER — Telehealth (INDEPENDENT_AMBULATORY_CARE_PROVIDER_SITE_OTHER): Payer: Self-pay

## 2020-07-16 ENCOUNTER — Ambulatory Visit (HOSPITAL_COMMUNITY): Payer: Medicare Other | Admitting: Physician Assistant

## 2020-07-16 DIAGNOSIS — E782 Mixed hyperlipidemia: Secondary | ICD-10-CM | POA: Insufficient documentation

## 2020-07-16 DIAGNOSIS — F419 Anxiety disorder, unspecified: Secondary | ICD-10-CM | POA: Diagnosis not present

## 2020-07-16 DIAGNOSIS — Z87891 Personal history of nicotine dependence: Secondary | ICD-10-CM | POA: Insufficient documentation

## 2020-07-16 DIAGNOSIS — M069 Rheumatoid arthritis, unspecified: Secondary | ICD-10-CM | POA: Diagnosis not present

## 2020-07-16 DIAGNOSIS — Z79899 Other long term (current) drug therapy: Secondary | ICD-10-CM | POA: Diagnosis not present

## 2020-07-16 DIAGNOSIS — G8918 Other acute postprocedural pain: Secondary | ICD-10-CM | POA: Diagnosis not present

## 2020-07-16 DIAGNOSIS — L959 Vasculitis limited to the skin, unspecified: Secondary | ICD-10-CM | POA: Diagnosis not present

## 2020-07-16 DIAGNOSIS — M1712 Unilateral primary osteoarthritis, left knee: Secondary | ICD-10-CM | POA: Diagnosis not present

## 2020-07-16 DIAGNOSIS — M25562 Pain in left knee: Secondary | ICD-10-CM | POA: Diagnosis present

## 2020-07-16 DIAGNOSIS — F329 Major depressive disorder, single episode, unspecified: Secondary | ICD-10-CM | POA: Diagnosis not present

## 2020-07-16 DIAGNOSIS — M160 Bilateral primary osteoarthritis of hip: Secondary | ICD-10-CM | POA: Diagnosis not present

## 2020-07-16 DIAGNOSIS — Z791 Long term (current) use of non-steroidal anti-inflammatories (NSAID): Secondary | ICD-10-CM | POA: Diagnosis not present

## 2020-07-16 DIAGNOSIS — M179 Osteoarthritis of knee, unspecified: Secondary | ICD-10-CM

## 2020-07-16 DIAGNOSIS — I2510 Atherosclerotic heart disease of native coronary artery without angina pectoris: Secondary | ICD-10-CM | POA: Insufficient documentation

## 2020-07-16 DIAGNOSIS — K589 Irritable bowel syndrome without diarrhea: Secondary | ICD-10-CM | POA: Diagnosis not present

## 2020-07-16 DIAGNOSIS — M1710 Unilateral primary osteoarthritis, unspecified knee: Secondary | ICD-10-CM | POA: Diagnosis present

## 2020-07-16 HISTORY — PX: TOTAL KNEE ARTHROPLASTY: SHX125

## 2020-07-16 LAB — TYPE AND SCREEN
ABO/RH(D): O POS
Antibody Screen: NEGATIVE

## 2020-07-16 SURGERY — ARTHROPLASTY, KNEE, TOTAL
Anesthesia: Regional | Site: Knee | Laterality: Left

## 2020-07-16 MED ORDER — PHENYLEPHRINE 40 MCG/ML (10ML) SYRINGE FOR IV PUSH (FOR BLOOD PRESSURE SUPPORT)
PREFILLED_SYRINGE | INTRAVENOUS | Status: AC
  Filled 2020-07-16: qty 10

## 2020-07-16 MED ORDER — DOCUSATE SODIUM 100 MG PO CAPS
100.0000 mg | ORAL_CAPSULE | Freq: Two times a day (BID) | ORAL | Status: DC
  Administered 2020-07-16 – 2020-07-17 (×2): 100 mg via ORAL
  Filled 2020-07-16 (×2): qty 1

## 2020-07-16 MED ORDER — SODIUM CHLORIDE (PF) 0.9 % IJ SOLN
INTRAMUSCULAR | Status: AC
  Filled 2020-07-16: qty 10

## 2020-07-16 MED ORDER — PROPOFOL 10 MG/ML IV BOLUS
INTRAVENOUS | Status: DC | PRN
  Administered 2020-07-16: 30 mg via INTRAVENOUS

## 2020-07-16 MED ORDER — DEXAMETHASONE SODIUM PHOSPHATE 10 MG/ML IJ SOLN
INTRAMUSCULAR | Status: DC | PRN
  Administered 2020-07-16: 10 mg via INTRAVENOUS

## 2020-07-16 MED ORDER — MENTHOL 3 MG MT LOZG
1.0000 | LOZENGE | OROMUCOSAL | Status: DC | PRN
Start: 2020-07-16 — End: 2020-07-17

## 2020-07-16 MED ORDER — PROGESTERONE 200 MG PO CAPS
200.0000 mg | ORAL_CAPSULE | Freq: Every day | ORAL | Status: DC
  Administered 2020-07-17: 200 mg via ORAL
  Filled 2020-07-16: qty 1

## 2020-07-16 MED ORDER — METHOCARBAMOL 500 MG IVPB - SIMPLE MED
500.0000 mg | Freq: Four times a day (QID) | INTRAVENOUS | Status: DC | PRN
  Filled 2020-07-16: qty 50

## 2020-07-16 MED ORDER — LACTATED RINGERS IV SOLN: INTRAVENOUS | Status: DC

## 2020-07-16 MED ORDER — ONDANSETRON HCL 4 MG/2ML IJ SOLN: 4.0000 mg | Freq: Four times a day (QID) | INTRAMUSCULAR | Status: DC | PRN

## 2020-07-16 MED ORDER — DIPHENHYDRAMINE HCL 12.5 MG/5ML PO ELIX: 12.5000 mg | ORAL_SOLUTION | ORAL | Status: DC | PRN

## 2020-07-16 MED ORDER — ONDANSETRON HCL 4 MG/2ML IJ SOLN: 4.0000 mg | Freq: Once | INTRAMUSCULAR | Status: DC | PRN

## 2020-07-16 MED ORDER — DEXAMETHASONE SODIUM PHOSPHATE 10 MG/ML IJ SOLN
10.0000 mg | Freq: Once | INTRAMUSCULAR | Status: DC
  Filled 2020-07-16: qty 1

## 2020-07-16 MED ORDER — ROPIVACAINE HCL 5 MG/ML IJ SOLN
INTRAMUSCULAR | Status: DC | PRN
  Administered 2020-07-16: 30 mL via PERINEURAL

## 2020-07-16 MED ORDER — METOCLOPRAMIDE HCL 5 MG PO TABS: 5.0000 mg | ORAL_TABLET | Freq: Three times a day (TID) | ORAL | Status: DC | PRN

## 2020-07-16 MED ORDER — ACETAMINOPHEN 10 MG/ML IV SOLN: 1000.0000 mg | Freq: Four times a day (QID) | INTRAVENOUS | Status: DC

## 2020-07-16 MED ORDER — TRAMADOL HCL 50 MG PO TABS
50.0000 mg | ORAL_TABLET | Freq: Three times a day (TID) | ORAL | Status: DC | PRN
  Administered 2020-07-16: 50 mg via ORAL
  Filled 2020-07-16: qty 1

## 2020-07-16 MED ORDER — METHOCARBAMOL 500 MG PO TABS
500.0000 mg | ORAL_TABLET | Freq: Four times a day (QID) | ORAL | Status: DC | PRN
  Administered 2020-07-16 – 2020-07-17 (×3): 500 mg via ORAL
  Filled 2020-07-16 (×4): qty 1

## 2020-07-16 MED ORDER — DEXAMETHASONE SODIUM PHOSPHATE 10 MG/ML IJ SOLN: 8.0000 mg | Freq: Once | INTRAMUSCULAR | Status: DC

## 2020-07-16 MED ORDER — NITROGLYCERIN 0.4 MG SL SUBL: 0.4000 mg | SUBLINGUAL_TABLET | SUBLINGUAL | Status: DC | PRN

## 2020-07-16 MED ORDER — 0.9 % SODIUM CHLORIDE (POUR BTL) OPTIME
TOPICAL | Status: DC | PRN
  Administered 2020-07-16: 1000 mL

## 2020-07-16 MED ORDER — CEFAZOLIN SODIUM-DEXTROSE 2-4 GM/100ML-% IV SOLN
2.0000 g | Freq: Once | INTRAVENOUS | Status: AC
  Administered 2020-07-16: 2 g via INTRAVENOUS
  Filled 2020-07-16: qty 100

## 2020-07-16 MED ORDER — PROPOFOL 1000 MG/100ML IV EMUL
INTRAVENOUS | Status: AC
  Filled 2020-07-16: qty 100

## 2020-07-16 MED ORDER — ONDANSETRON HCL 4 MG/2ML IJ SOLN
INTRAMUSCULAR | Status: AC
  Filled 2020-07-16: qty 2

## 2020-07-16 MED ORDER — FLEET ENEMA 7-19 GM/118ML RE ENEM: 1.0000 | ENEMA | Freq: Once | RECTAL | Status: DC | PRN

## 2020-07-16 MED ORDER — METHYLPHENIDATE HCL ER (OSM) 18 MG PO TBCR
54.0000 mg | EXTENDED_RELEASE_TABLET | Freq: Every day | ORAL | Status: DC
Start: 2020-07-17 — End: 2020-07-17
  Administered 2020-07-17: 54 mg via ORAL
  Filled 2020-07-16: qty 3
  Filled 2020-07-16: qty 2

## 2020-07-16 MED ORDER — VANCOMYCIN HCL IN DEXTROSE 1-5 GM/200ML-% IV SOLN: 1000.0000 mg | INTRAVENOUS | Status: DC

## 2020-07-16 MED ORDER — BUPIVACAINE LIPOSOME 1.3 % IJ SUSP
INTRAMUSCULAR | Status: DC | PRN
  Administered 2020-07-16: 20 mL

## 2020-07-16 MED ORDER — SODIUM CHLORIDE (PF) 0.9 % IJ SOLN
INTRAMUSCULAR | Status: DC | PRN
  Administered 2020-07-16: 60 mL

## 2020-07-16 MED ORDER — DEXAMETHASONE SODIUM PHOSPHATE 10 MG/ML IJ SOLN
INTRAMUSCULAR | Status: AC
  Filled 2020-07-16: qty 1

## 2020-07-16 MED ORDER — BISACODYL 10 MG RE SUPP: 10.0000 mg | Freq: Every day | RECTAL | Status: DC | PRN

## 2020-07-16 MED ORDER — SODIUM CHLORIDE 0.9 % IV SOLN: INTRAVENOUS | Status: DC

## 2020-07-16 MED ORDER — TRANEXAMIC ACID-NACL 1000-0.7 MG/100ML-% IV SOLN
1000.0000 mg | INTRAVENOUS | Status: AC
  Administered 2020-07-16: 1000 mg via INTRAVENOUS
  Filled 2020-07-16: qty 100

## 2020-07-16 MED ORDER — POVIDONE-IODINE 10 % EX SWAB
2.0000 "application " | Freq: Once | CUTANEOUS | Status: AC
  Administered 2020-07-16: 2 via TOPICAL

## 2020-07-16 MED ORDER — CHLORHEXIDINE GLUCONATE 0.12 % MT SOLN
15.0000 mL | Freq: Once | OROMUCOSAL | Status: AC
  Administered 2020-07-16: 15 mL via OROMUCOSAL

## 2020-07-16 MED ORDER — ORAL CARE MOUTH RINSE: 15.0000 mL | Freq: Once | OROMUCOSAL | Status: AC

## 2020-07-16 MED ORDER — ASPIRIN EC 325 MG PO TBEC
325.0000 mg | DELAYED_RELEASE_TABLET | Freq: Two times a day (BID) | ORAL | Status: DC
  Administered 2020-07-17: 325 mg via ORAL
  Filled 2020-07-16: qty 1

## 2020-07-16 MED ORDER — PROPOFOL 500 MG/50ML IV EMUL
INTRAVENOUS | Status: DC | PRN
  Administered 2020-07-16: 100 ug/kg/min via INTRAVENOUS

## 2020-07-16 MED ORDER — PREGABALIN 75 MG PO CAPS
75.0000 mg | ORAL_CAPSULE | Freq: Two times a day (BID) | ORAL | Status: DC
  Administered 2020-07-16 – 2020-07-17 (×2): 75 mg via ORAL
  Filled 2020-07-16 (×2): qty 1

## 2020-07-16 MED ORDER — ACETAMINOPHEN 500 MG PO TABS
1000.0000 mg | ORAL_TABLET | Freq: Four times a day (QID) | ORAL | Status: AC
  Administered 2020-07-16 – 2020-07-17 (×3): 1000 mg via ORAL
  Filled 2020-07-16 (×4): qty 2

## 2020-07-16 MED ORDER — POLYETHYLENE GLYCOL 3350 17 G PO PACK: 17.0000 g | PACK | Freq: Every day | ORAL | Status: DC | PRN

## 2020-07-16 MED ORDER — MIDAZOLAM HCL 2 MG/2ML IJ SOLN
1.0000 mg | INTRAMUSCULAR | Status: DC
  Administered 2020-07-16: 2 mg via INTRAVENOUS
  Filled 2020-07-16: qty 2

## 2020-07-16 MED ORDER — MEPERIDINE HCL 50 MG/ML IJ SOLN
6.2500 mg | INTRAMUSCULAR | Status: DC | PRN
  Administered 2020-07-16 (×2): 12.5 mg via INTRAVENOUS

## 2020-07-16 MED ORDER — ONDANSETRON HCL 4 MG PO TABS: 4.0000 mg | ORAL_TABLET | Freq: Four times a day (QID) | ORAL | Status: DC | PRN

## 2020-07-16 MED ORDER — PHENOL 1.4 % MT LIQD: 1.0000 | OROMUCOSAL | Status: DC | PRN

## 2020-07-16 MED ORDER — FENTANYL CITRATE (PF) 100 MCG/2ML IJ SOLN
50.0000 ug | INTRAMUSCULAR | Status: DC
  Filled 2020-07-16: qty 2

## 2020-07-16 MED ORDER — BUPIVACAINE IN DEXTROSE 0.75-8.25 % IT SOLN
INTRATHECAL | Status: DC | PRN
  Administered 2020-07-16: 1.4 mL via INTRATHECAL

## 2020-07-16 MED ORDER — OXYCODONE HCL 5 MG PO TABS
5.0000 mg | ORAL_TABLET | ORAL | Status: DC | PRN
  Administered 2020-07-16 (×2): 10 mg via ORAL
  Administered 2020-07-16: 5 mg via ORAL
  Administered 2020-07-17: 10 mg via ORAL
  Filled 2020-07-16 (×3): qty 2
  Filled 2020-07-16: qty 1
  Filled 2020-07-16: qty 2

## 2020-07-16 MED ORDER — FENTANYL CITRATE (PF) 100 MCG/2ML IJ SOLN: 25.0000 ug | INTRAMUSCULAR | Status: DC | PRN

## 2020-07-16 MED ORDER — SODIUM CHLORIDE 0.9 % IR SOLN
Status: DC | PRN
  Administered 2020-07-16: 1000 mL

## 2020-07-16 MED ORDER — STERILE WATER FOR IRRIGATION IR SOLN
Status: DC | PRN
  Administered 2020-07-16: 2000 mL

## 2020-07-16 MED ORDER — ONDANSETRON HCL 4 MG/2ML IJ SOLN
INTRAMUSCULAR | Status: DC | PRN
  Administered 2020-07-16: 4 mg via INTRAVENOUS

## 2020-07-16 MED ORDER — MEPERIDINE HCL 50 MG/ML IJ SOLN
INTRAMUSCULAR | Status: AC
  Filled 2020-07-16: qty 1

## 2020-07-16 MED ORDER — PHENYLEPHRINE 40 MCG/ML (10ML) SYRINGE FOR IV PUSH (FOR BLOOD PRESSURE SUPPORT)
PREFILLED_SYRINGE | INTRAVENOUS | Status: DC | PRN
  Administered 2020-07-16 (×2): 80 ug via INTRAVENOUS

## 2020-07-16 MED ORDER — EPHEDRINE 5 MG/ML INJ
INTRAVENOUS | Status: AC
  Filled 2020-07-16: qty 10

## 2020-07-16 MED ORDER — METOCLOPRAMIDE HCL 5 MG/ML IJ SOLN: 5.0000 mg | Freq: Three times a day (TID) | INTRAMUSCULAR | Status: DC | PRN

## 2020-07-16 MED ORDER — CEFAZOLIN SODIUM-DEXTROSE 2-4 GM/100ML-% IV SOLN
2.0000 g | Freq: Four times a day (QID) | INTRAVENOUS | Status: AC
  Administered 2020-07-16 (×2): 2 g via INTRAVENOUS
  Filled 2020-07-16 (×2): qty 100

## 2020-07-16 MED ORDER — THYROID 60 MG PO TABS
60.0000 mg | ORAL_TABLET | Freq: Every day | ORAL | Status: DC
  Administered 2020-07-17: 60 mg via ORAL
  Filled 2020-07-16: qty 1

## 2020-07-16 MED ORDER — HYDROMORPHONE HCL 1 MG/ML IJ SOLN
0.5000 mg | INTRAMUSCULAR | Status: DC | PRN
  Administered 2020-07-17: 0.5 mg via INTRAVENOUS
  Filled 2020-07-16: qty 1

## 2020-07-16 MED ORDER — AMISULPRIDE (ANTIEMETIC) 5 MG/2ML IV SOLN: 10.0000 mg | Freq: Once | INTRAVENOUS | Status: DC | PRN

## 2020-07-16 MED ORDER — HYDROMORPHONE HCL 2 MG PO TABS: 2.0000 mg | ORAL_TABLET | ORAL | Status: DC | PRN

## 2020-07-16 SURGICAL SUPPLY — 53 items
ATTUNE PSFEM LTSZ6 NARCEM KNEE (Femur) ×2 IMPLANT
ATTUNE PSRP INSR SZ6 8 KNEE (Insert) ×2 IMPLANT
BAG ZIPLOCK 12X15 (MISCELLANEOUS) ×2 IMPLANT
BASE TIBIAL ROT PLAT SZ 5 KNEE (Knees) ×1 IMPLANT
BLADE SAG 18X100X1.27 (BLADE) ×2 IMPLANT
BLADE SAW SGTL 11.0X1.19X90.0M (BLADE) ×2 IMPLANT
BNDG ELASTIC 6X5.8 VLCR STR LF (GAUZE/BANDAGES/DRESSINGS) ×2 IMPLANT
BOWL SMART MIX CTS (DISPOSABLE) ×2 IMPLANT
CEMENT HV SMART SET (Cement) ×4 IMPLANT
COVER SURGICAL LIGHT HANDLE (MISCELLANEOUS) ×2 IMPLANT
COVER WAND RF STERILE (DRAPES) IMPLANT
CUFF TOURN SGL QUICK 34 (TOURNIQUET CUFF) ×2
CUFF TRNQT CYL 34X4.125X (TOURNIQUET CUFF) ×1 IMPLANT
DECANTER SPIKE VIAL GLASS SM (MISCELLANEOUS) ×2 IMPLANT
DRAPE 3/4 80X56 (DRAPES) ×2 IMPLANT
DRAPE U-SHAPE 47X51 STRL (DRAPES) ×2 IMPLANT
DRSG AQUACEL AG ADV 3.5X10 (GAUZE/BANDAGES/DRESSINGS) ×2 IMPLANT
DURAPREP 26ML APPLICATOR (WOUND CARE) ×2 IMPLANT
ELECT REM PT RETURN 15FT ADLT (MISCELLANEOUS) ×2 IMPLANT
GLOVE SRG 8 PF TXTR STRL LF DI (GLOVE) ×1 IMPLANT
GLOVE SURG ENC MOIS LTX SZ6 (GLOVE) IMPLANT
GLOVE SURG ENC MOIS LTX SZ7 (GLOVE) ×2 IMPLANT
GLOVE SURG ENC MOIS LTX SZ8 (GLOVE) ×2 IMPLANT
GLOVE SURG UNDER POLY LF SZ6.5 (GLOVE) ×2 IMPLANT
GLOVE SURG UNDER POLY LF SZ8 (GLOVE) ×2
GLOVE SURG UNDER POLY LF SZ8.5 (GLOVE) ×2 IMPLANT
GOWN STRL REUS W/TWL LRG LVL3 (GOWN DISPOSABLE) ×4 IMPLANT
GOWN STRL REUS W/TWL XL LVL3 (GOWN DISPOSABLE) ×2 IMPLANT
HANDPIECE INTERPULSE COAX TIP (DISPOSABLE) ×2
HOLDER FOLEY CATH W/STRAP (MISCELLANEOUS) IMPLANT
IMMOBILIZER KNEE 20 (SOFTGOODS) ×2
IMMOBILIZER KNEE 20 THIGH 36 (SOFTGOODS) ×1 IMPLANT
KIT TURNOVER KIT A (KITS) ×2 IMPLANT
MANIFOLD NEPTUNE II (INSTRUMENTS) ×2 IMPLANT
NS IRRIG 1000ML POUR BTL (IV SOLUTION) ×2 IMPLANT
PACK TOTAL KNEE CUSTOM (KITS) ×2 IMPLANT
PADDING CAST COTTON 6X4 STRL (CAST SUPPLIES) ×2 IMPLANT
PATELLA MEDIAL ATTUN 35MM KNEE (Knees) ×2 IMPLANT
PENCIL SMOKE EVACUATOR (MISCELLANEOUS) ×2 IMPLANT
PIN DRILL FIX HALF THREAD (BIT) ×2 IMPLANT
PIN STEINMAN FIXATION KNEE (PIN) ×2 IMPLANT
PROTECTOR NERVE ULNAR (MISCELLANEOUS) ×2 IMPLANT
SET HNDPC FAN SPRY TIP SCT (DISPOSABLE) ×1 IMPLANT
STRIP CLOSURE SKIN 1/2X4 (GAUZE/BANDAGES/DRESSINGS) ×4 IMPLANT
SUT MNCRL AB 4-0 PS2 18 (SUTURE) ×2 IMPLANT
SUT STRATAFIX 0 PDS 27 VIOLET (SUTURE) ×2
SUT VIC AB 2-0 CT1 27 (SUTURE) ×6
SUT VIC AB 2-0 CT1 TAPERPNT 27 (SUTURE) ×3 IMPLANT
SUTURE STRATFX 0 PDS 27 VIOLET (SUTURE) ×1 IMPLANT
TIBIAL BASE ROT PLAT SZ 5 KNEE (Knees) ×2 IMPLANT
TRAY FOLEY MTR SLVR 16FR STAT (SET/KITS/TRAYS/PACK) ×2 IMPLANT
WATER STERILE IRR 1000ML POUR (IV SOLUTION) ×4 IMPLANT
WRAP KNEE MAXI GEL POST OP (GAUZE/BANDAGES/DRESSINGS) ×2 IMPLANT

## 2020-07-16 NOTE — Op Note (Signed)
OPERATIVE REPORT-TOTAL KNEE ARTHROPLASTY   Pre-operative diagnosis- Osteoarthritis  Left knee(s)  Post-operative diagnosis- Osteoarthritis Left knee(s)  Procedure-  Left  Total Knee Arthroplasty  Surgeon- Dione Plover. Kaylin Marcon, MD  Assistant- Theresa Duty, PA-C   Anesthesia-  Adductor canal block and spinal  EBL- 25 ml   Drains None  Tourniquet time-  Total Tourniquet Time Documented: Thigh (Left) - 31 minutes Total: Thigh (Left) - 31 minutes     Complications- None  Condition-PACU - hemodynamically stable.   Brief Clinical Note  Courtney [name] is a 70 y.o. year old female with end stage OA of her left knee with progressively worsening pain and dysfunction. She has constant pain, with activity and at rest and significant functional deficits with difficulties even with ADLs. She has had extensive non-op management including analgesics, injections of cortisone and viscosupplements, and home exercise program, but remains in significant pain with significant dysfunction. Radiographs show bone on bone arthritis medial and patellofemoral. She presents now for left Total Knee Arthroplasty.    Procedure in detail---   The patient is brought into the operating room and positioned supine on the operating table. After successful administration of  Adductor canal block and spinal,   a tourniquet is placed high on the  Left thigh(s) and the lower extremity is prepped and draped in the usual sterile fashion. Time out is performed by the operating team and then the  Left lower extremity is wrapped in Esmarch, knee flexed and the tourniquet inflated to 300 mmHg.       A midline incision is made with a ten blade through the subcutaneous tissue to the level of the extensor mechanism. A fresh blade is used to make a medial parapatellar arthrotomy. Soft tissue over the proximal medial tibia is subperiosteally elevated to the joint line with a knife and into the semimembranosus bursa with a  Cobb elevator. Soft tissue over the proximal lateral tibia is elevated with attention being paid to avoiding the patellar tendon on the tibial tubercle. The patella is everted, knee flexed 90 degrees and the ACL and PCL are removed. Findings are bone on bone medial and patellofemoral with large global osteophytes.        The drill is used to create a starting hole in the distal femur and the canal is thoroughly irrigated with sterile saline to remove the fatty contents. The 5 degree Left  valgus alignment guide is placed into the femoral canal and the distal femoral cutting block is pinned to remove 9 mm off the distal femur. Resection is made with an oscillating saw.      The tibia is subluxed forward and the menisci are removed. The extramedullary alignment guide is placed referencing proximally at the medial aspect of the tibial tubercle and distally along the second metatarsal axis and tibial crest. The block is pinned to remove 84mm off the more deficient medial  side. Resection is made with an oscillating saw. Size 5is the most appropriate size for the tibia and the proximal tibia is prepared with the modular drill and keel punch for that size.      The femoral sizing guide is placed and size 6 is most appropriate. Rotation is marked off the epicondylar axis and confirmed by creating a rectangular flexion gap at 90 degrees. The size 6 cutting block is pinned in this rotation and the anterior, posterior and chamfer cuts are made with the oscillating saw. The intercondylar block is then placed and that cut is made.  Trial size 5 tibial component, trial size 6 narrow posterior stabilized femur and a 8  mm posterior stabilized rotating platform insert trial is placed. Full extension is achieved with excellent varus/valgus and anterior/posterior balance throughout full range of motion. The patella is everted and thickness measured to be 21  mm. Free hand resection is taken to 12 mm, a 35 template is placed,  lug holes are drilled, trial patella is placed, and it tracks normally. Osteophytes are removed off the posterior femur with the trial in place. All trials are removed and the cut bone surfaces prepared with pulsatile lavage. Cement is mixed and once ready for implantation, the size 5 tibial implant, size  6 narrow posterior stabilized femoral component, and the size 35 patella are cemented in place and the patella is held with the clamp. The trial insert is placed and the knee held in full extension. The Exparel (20 ml mixed with 60 ml saline) is injected into the extensor mechanism, posterior capsule, medial and lateral gutters and subcutaneous tissues.  All extruded cement is removed and once the cement is hard the permanent 8 mm posterior stabilized rotating platform insert is placed into the tibial tray.      The wound is copiously irrigated with saline solution and the extensor mechanism closed with # 0 Stratofix suture. The tourniquet is released for a total tourniquet time of 31  minutes. Flexion against gravity is 140 degrees and the patella tracks normally. Subcutaneous tissue is closed with 2.0 vicryl and subcuticular with running 4.0 Monocryl. The incision is cleaned and dried and steri-strips and a bulky sterile dressing are applied. The limb is placed into a knee immobilizer and the patient is awakened and transported to recovery in stable condition.      Please note that a surgical assistant was a medical necessity for this procedure in order to perform it in a safe and expeditious manner. Surgical assistant was necessary to retract the ligaments and vital neurovascular structures to prevent injury to them and also necessary for proper positioning of the limb to allow for anatomic placement of the prosthesis.   Dione Plover Hiro Vipond, MD    07/16/2020, 9:22 AM

## 2020-07-16 NOTE — Anesthesia Procedure Notes (Signed)
Spinal  Patient location during procedure: OR Start time: 07/16/2020 8:14 AM End time: 07/16/2020 8:17 AM Reason for block: surgical anesthesia Staffing Performed: resident/CRNA  Resident/CRNA: [place] Abran Gavigan C, CRNA Preanesthetic Checklist Completed: patient identified, IV checked, site marked, risks and benefits discussed, surgical consent, monitors and equipment checked, pre-op evaluation and timeout performed Spinal Block Patient position: sitting Prep: DuraPrep and site prepped and draped Patient monitoring: heart rate, cardiac monitor, continuous pulse ox and blood pressure Approach: midline Location: L3-4 Injection technique: single-shot Needle Needle type: Pencan  Needle gauge: 24 G Needle length: 9 cm Assessment Sensory level: T4 Events: CSF return Additional Notes IV functioning, monitors applied to pt. Expiration date of kit checked and confirmed to be in date. Sterile prep and drape, hand hygiene and sterile gloved used. Pt was positioned and spine was prepped in sterile fashion. Skin was anesthetized with lidocaine. Free flow of clear CSF obtained prior to injecting local anesthetic into CSF x 1 attempt. Spinal needle aspirated freely following injection. Needle was carefully withdrawn, and pt tolerated procedure well. Loss of motor and sensory on exam post injection.

## 2020-07-16 NOTE — Transfer of Care (Signed)
Immediate Anesthesia Transfer of Care Note  Patient: Courtney [name]  Procedure(s) Performed: TOTAL KNEE ARTHROPLASTY (Left Knee)  Patient Location: PACU  Anesthesia Type:Spinal  Level of Consciousness: drowsy  Airway & Oxygen Therapy: Patient Spontanous Breathing and Patient connected to face mask oxygen  Post-op Assessment: Report given to RN and Post -op Vital signs reviewed and stable  Post vital signs: Reviewed and stable  Last Vitals:  Vitals Value Taken Time  BP 97/54 07/16/20 0942  Temp    Pulse 61 07/16/20 0945  Resp 13 07/16/20 0945  SpO2 100 % 07/16/20 0945  Vitals shown include unvalidated device data.  Last Pain:  Vitals:   07/16/20 0657  TempSrc:   PainSc: 3       Patients Stated Pain Goal: 3 (24/49/75 3005)  Complications: No complications documented.

## 2020-07-16 NOTE — Progress Notes (Signed)
Assisted Dr. Ellender with left, ultrasound guided, adductor canal block. Side rails up, monitors on throughout procedure. See vital signs in flow sheet. Tolerated Procedure well.  

## 2020-07-16 NOTE — Telephone Encounter (Signed)
Patient called and left a detailed voice message and stated that the [facility] system shows her taking 2 of the Progesterone 200 mg for a total of 400 mg daily and her bottle shows she is taking 1 of the 200 mg and she wants clarification of the correct dose that she should be taking.  Also patient wants to know if it is OK to continue her medications 3 days prior to her knee surgery and the also OK to resume all medications after her surgery.  Please advise.

## 2020-07-16 NOTE — Anesthesia Postprocedure Evaluation (Signed)
Anesthesia Post Note  Patient: Courtney [name]  Procedure(s) Performed: TOTAL KNEE ARTHROPLASTY (Left Knee)     Patient location during evaluation: PACU Anesthesia Type: Regional and Spinal Level of consciousness: awake Pain management: pain level controlled Vital Signs Assessment: post-procedure vital signs reviewed and stable Respiratory status: spontaneous breathing, nonlabored ventilation, respiratory function stable and patient connected to nasal cannula oxygen Cardiovascular status: stable and blood pressure returned to baseline Postop Assessment: no apparent nausea or vomiting Anesthetic complications: no   No complications documented.  Last Vitals:  Vitals:   07/16/20 1239 07/16/20 1431  BP: 126/72 118/62  Pulse: 73 90  Resp: 16 16  Temp: 36.7 C 36.6 C  SpO2: 100% 98%    Last Pain:  Vitals:   07/16/20 1510  TempSrc:   PainSc: 7                  Matteus Mcnelly P Wakisha Alberts

## 2020-07-16 NOTE — Telephone Encounter (Signed)
I received a MyChart message with the same questions that she called you about.  I responded back to her.  She needs to be taking progesterone 200 mg capsule, take 1 at night. She does not need to stop any hormones prior to surgery.

## 2020-07-16 NOTE — Progress Notes (Signed)
Orthopedic Tech Progress Note Patient Details:  Courtney [name] 04-Nov-1950 [id number]  Patient ID: Courtney [name], female   DOB: [birth year], 70 y.o.   MRN: [id number]   Courtney [name] 07/16/2020, 10:26 AM cpm placed on patient in pacu @1025 

## 2020-07-16 NOTE — Interval H&P Note (Signed)
History and Physical Interval Note:  07/16/2020 6:31 AM  Courtney [name]  has presented today for surgery, with the diagnosis of left knee osteoarthritis.  The various methods of treatment have been discussed with the patient and family. After consideration of risks, benefits and other options for treatment, the patient has consented to  Procedure(s) with comments: TOTAL KNEE ARTHROPLASTY (Left) - 49min as a surgical intervention.  The patient's history has been reviewed, patient examined, no change in status, stable for surgery.  I have reviewed the patient's chart and labs.  Questions were answered to the patient's satisfaction.     Pilar Plate Michelle Wnek

## 2020-07-16 NOTE — Evaluation (Signed)
Physical Therapy Evaluation Patient Details Name: Courtney [name] MRN: [id number] DOB: 19-Jan-1951 Today's Date: 07/16/2020   History of Present Illness  Patient is 70 y.o. 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    Clinical Impression  Courtney [name] is a 70 y.o. female POD 0 s/p Lt TKA. Patient reports independence with mobility at baseline. Patient is now limited by functional impairments (see PT problem list below) and requires min assist for transfers with RW. 2+ Assist provided today as pt reported some numbness in feet and was mildly impulsive with mobility. Patient took several small steps with RW and min +2 assist to move bed>chair. Patient instructed in exercise to facilitate circulation to manage edema and reduce risk of DVT. Patient will benefit from continued skilled PT interventions to address impairments and progress towards PLOF. Acute PT will follow to progress mobility and stair training in preparation for safe discharge home.     Follow Up Recommendations Outpatient PT;Follow surgeon's recommendation for DC plan and follow-up therapies    Equipment Recommendations  Rolling walker with 5" wheels;3in1 (PT) (youth RW)    Recommendations for Other Services       Precautions / Restrictions Precautions Precautions: Fall Restrictions Weight Bearing Restrictions: No (Simultaneous filing. User may not have seen previous data.) LLE Weight Bearing: Weight bearing as tolerated Other Position/Activity Restrictions: WBAT      Mobility  Bed Mobility Overal bed mobility: Needs Assistance Bed Mobility: Supine to Sit     Supine to sit: Min assist;HOB elevated     General bed mobility comments: pt sat up via long sit and then began to move Rt LE to EOB. pt required assist for Lt LE to EOB and cues to scoot forward.    Transfers Overall transfer level: Needs  assistance Equipment used: Rolling walker (2 wheeled) Transfers: Sit to/from [facility] Sit to Stand: +2 safety/equipment;Min assist Stand pivot transfers: Min assist;+2 safety/equipment       General transfer comment: Repeated cues for safe hand placement/technique with RW. Min assist for power up and to steady with rise/stand, 2+ assist for safey as pt anxious and mildly impulsive with movements. Small steps taken forward and sideways for stand step transfer bed>chair.  Ambulation/Gait Ambulation/Gait assistance: +2 safety/equipment Gait Distance (Feet): 2 Feet Assistive device: Rolling walker (2 wheeled) Gait Pattern/deviations: Step-to pattern;Decreased stride length;Decreased weight shift to left Gait velocity: decr   General Gait Details: pt required cues for safe use of RW and to keep hands on RW, pt with tendency to pick Rt hand up off walker 2/2 shoulder pain. pt mildly unsteady suspect due to spinal block.  Stairs            Wheelchair Mobility    Modified Rankin (Stroke Patients Only)       Balance Overall balance assessment: Needs assistance Sitting-balance support: Feet supported Sitting balance-Leahy Scale: Good Sitting balance - Comments: pt leaning very far foward sitting EOB to look almost under the bed. Cues needed for safety.   Standing balance support: During functional activity;Bilateral upper extremity supported Standing balance-Leahy Scale: Fair                               Pertinent Vitals/Pain Pain Assessment: 0-10 Pain Score: 8  Pain Location: Lt knee Pain Descriptors / Indicators: Aching;Grimacing;Guarding Pain Intervention(s): Limited activity within patient's tolerance;Monitored  during session;Repositioned;Ice applied;RN gave pain meds during session    Courtney [name] expects to be discharged to:: Private residence Living Arrangements: Spouse/significant other Available Help at Discharge:  Family Type of Home: House Home Access: Stairs to enter Entrance Stairs-Rails: Left Entrance Stairs-Number of Steps: [place]: Two level;Able to live on main level with bedroom/bathroom;Bed/bath upstairs Home Equipment: None      Prior Function Level of Independence: Independent               Hand Dominance   Dominant Hand: Right    Extremity/Trunk Assessment   Upper Extremity Assessment Upper Extremity Assessment: Overall WFL for tasks assessed    Lower Extremity Assessment Lower Extremity Assessment: LLE deficits/detail LLE Deficits / Details: pt with good quad set, no extensor lag with SLR, dorsiflexion 4/5 bil LE, plantar flexion 3/5 on Lt and 4/5 on Rt. LLE Sensation:  (pt reports somw tingling in butt and feet) LLE Coordination: WNL    Cervical / Trunk Assessment Cervical / Trunk Assessment: Normal  Communication   Communication: No difficulties  Cognition Arousal/Alertness: Awake/alert Behavior During Therapy: Anxious Overall Cognitive Status: Within Functional Limits for tasks assessed                                        General Comments      Exercises Total Joint Exercises Ankle Circles/Pumps: AROM;20 reps;Both;Seated   Assessment/Plan    PT Assessment Patient needs continued PT services  PT Problem List Decreased strength;Decreased range of motion;Decreased activity tolerance;Decreased balance;Decreased mobility;Decreased knowledge of use of DME;Decreased safety awareness;Decreased knowledge of precautions;Pain       PT Treatment Interventions DME instruction;Gait training;Stair training;Functional mobility training;Therapeutic activities;Therapeutic exercise;Balance training;Patient/family education    PT Goals (Current goals can be found in the Care Plan section)  Acute Rehab PT Goals Patient Stated Goal: get back to walking and hiking with husband PT Goal Formulation: With patient Time For Goal Achievement:  07/23/20 Potential to Achieve Goals: Good    Frequency 7X/week   Barriers to discharge        Co-evaluation               AM-PAC PT "6 Clicks" Mobility  Outcome Measure Help needed turning from your back to your side while in a flat bed without using bedrails?: None Help needed moving from lying on your back to sitting on the side of a flat bed without using bedrails?: A Little Help needed moving to and from a bed to a chair (including a wheelchair)?: A Little Help needed standing up from a chair using your arms (e.g., wheelchair or bedside chair)?: A Little Help needed to walk in hospital room?: A Little Help needed climbing 3-5 steps with a railing? : A Lot 6 Click Score: 18    End of Session Equipment Utilized During Treatment: Gait belt Activity Tolerance: Patient tolerated treatment well Patient left: in chair;with call bell/phone within reach;with chair alarm set;with nursing/sitter in room Nurse Communication: Mobility status PT Visit Diagnosis: Muscle weakness (generalized) (M62.81);Difficulty in walking, not elsewhere classified (R26.2);Pain Pain - Right/Left: Left Pain - part of body: Knee (Rt shoulder)    Time: 5956-3875 PT Time Calculation (min) (ACUTE ONLY): 28 min   Charges:   PT Evaluation $PT Eval Low Complexity: 1 Low PT Treatments $Therapeutic Activity: 8-22 mins        Gwynneth Albright PT, DPT Acute Rehabilitation  Services Office [phone] Pager [phone]    Jacques Navy 07/16/2020, 2:14 PM

## 2020-07-16 NOTE — Care Plan (Signed)
Ortho Bundle Case Management Note  Patient Details  Name: Courtney [name] MRN: [id number] Date of Birth: [birth year]  L TKA on 07-16-20 DCP:  Home with husband.  2 story home with 7 ste. DME:  RW and 3-in-1 ordered through [facility] PT:  [facility].  Eval scheduled on 07-18-20.                   DME Arranged:  Gilford Rile rolling,3-N-1 DME Agency:  [employer]  HH Arranged:  NA [place] Agency:  NA  Additional Comments: Please contact me with any questions of if this plan should need to change.  Marianne Sofia, RN,CCM [facility]  [phone] 07/16/2020, 12:53 PM

## 2020-07-16 NOTE — Discharge Instructions (Signed)
 Frank Aluisio, MD Total Joint Specialist [facility] [address] [place], [place] [address] [phone]  TOTAL KNEE REPLACEMENT POSTOPERATIVE DIRECTIONS    Knee Rehabilitation, Guidelines Following Surgery  Results after knee surgery are often greatly improved when you follow the exercise, range of motion and muscle strengthening exercises prescribed by your doctor. Safety measures are also important to protect the knee from further injury. If any of these exercises cause you to have increased pain or swelling in your knee joint, decrease the amount until you are comfortable again and slowly increase them. If you have problems or questions, call your caregiver or physical therapist for advice.   BLOOD CLOT PREVENTION . Take a 325 mg Aspirin two times a day for three weeks following surgery. Then take an 81 mg Aspirin once a day for three weeks. Then discontinue Aspirin. . You may resume your vitamins/supplements upon discharge from the hospital. . Do not take any NSAIDs (Advil, Aleve, Ibuprofen, Meloxicam, etc.) until you have discontinued the 325 mg Aspirin.  HOME CARE INSTRUCTIONS  . Remove items at home which could result in a fall. This includes throw rugs or furniture in walking pathways.  . ICE to the affected knee as much as tolerated. Icing helps control swelling. If the swelling is well controlled you will be more comfortable and rehab easier. Continue to use ice on the knee for pain and swelling from surgery. You may notice swelling that will progress down to the foot and ankle. This is normal after surgery. Elevate the leg when you are not up walking on it.    . Continue to use the breathing machine which will help keep your temperature down. It is common for your temperature to cycle up and down following surgery, especially at night when you are not up moving around and exerting yourself. The breathing machine keeps your lungs expanded and your  temperature down. . Do not place pillow under the operative knee, focus on keeping the knee straight while resting  DIET You may resume your previous home diet once you are discharged from the hospital.  DRESSING / WOUND CARE / SHOWERING . Keep your bulky bandage on for 2 days. On the third post-operative day you may remove the Ace bandage and gauze. There is a waterproof adhesive bandage on your skin which will stay in place until your first follow-up appointment. Once you remove this you will not need to place another bandage . You may begin showering 3 days following surgery, but do not submerge the incision under water.  ACTIVITY For the first 5 days, the key is rest and control of pain and swelling . Do your home exercises twice a day starting on post-operative day 3. On the days you go to physical therapy, just do the home exercises once that day. . You should rest, ice and elevate the leg for 50 minutes out of every hour. Get up and walk/stretch for 10 minutes per hour. After 5 days you can increase your activity slowly as tolerated. . Walk with your walker as instructed. Use the walker until you are comfortable transitioning to a cane. Walk with the cane in the opposite hand of the operative leg. You may discontinue the cane once you are comfortable and walking steadily. . Avoid periods of inactivity such as sitting longer than an hour when not asleep. This helps prevent blood clots.  . You may discontinue the knee immobilizer once you are able to perform a straight   leg raise while lying down. . You may resume a sexual relationship in one month or when given the OK by your doctor.  . You may return to work once you are cleared by your doctor.  . Do not drive a car for 6 weeks or until released by your surgeon.  . Do not drive while taking narcotics.  TED HOSE STOCKINGS Wear the elastic stockings on both legs for three weeks following surgery during the day. You may remove them at night  for sleeping.  WEIGHT BEARING Weight bearing as tolerated with assist device (walker, cane, etc) as directed, use it as long as suggested by your surgeon or therapist, typically at least 4-6 weeks.  POSTOPERATIVE CONSTIPATION PROTOCOL Constipation - defined medically as fewer than three stools per week and severe constipation as less than one stool per week.  One of the most common issues patients have following surgery is constipation.  Even if you have a regular bowel pattern at home, your normal regimen is likely to be disrupted due to multiple reasons following surgery.  Combination of anesthesia, postoperative narcotics, change in appetite and fluid intake all can affect your bowels.  In order to avoid complications following surgery, here are some recommendations in order to help you during your recovery period.  . Colace (docusate) - Pick up an over-the-counter form of Colace or another stool softener and take twice a day as long as you are requiring postoperative pain medications.  Take with a full glass of water daily.  If you experience loose stools or diarrhea, hold the colace until you stool forms back up. If your symptoms do not get better within 1 week or if they get worse, check with your doctor. . Dulcolax (bisacodyl) - Pick up over-the-counter and take as directed by the product packaging as needed to assist with the movement of your bowels.  Take with a full glass of water.  Use this product as needed if not relieved by Colace only.  . MiraLax (polyethylene glycol) - Pick up over-the-counter to have on hand. MiraLax is a solution that will increase the amount of water in your bowels to assist with bowel movements.  Take as directed and can mix with a glass of water, juice, soda, coffee, or tea. Take if you go more than two days without a movement. Do not use MiraLax more than once per day. Call your doctor if you are still constipated or irregular after using this medication for 7 days  in a row.  If you continue to have problems with postoperative constipation, please contact the office for further assistance and recommendations.  If you experience "the worst abdominal pain ever" or develop nausea or vomiting, please contact the office immediatly for further recommendations for treatment.  ITCHING If you experience itching with your medications, try taking only a single pain pill, or even half a pain pill at a time.  You can also use Benadryl over the counter for itching or also to help with sleep.   MEDICATIONS See your medication summary on the "After Visit Summary" that the nursing staff will review with you prior to discharge.  You may have some home medications which will be placed on hold until you complete the course of blood thinner medication.  It is important for you to complete the blood thinner medication as prescribed by your surgeon.  Continue your approved medications as instructed at time of discharge.  PRECAUTIONS . If you experience chest pain or shortness of   breath - call 911 immediately for transfer to the hospital emergency department.  . If you develop a fever greater that 101 F, purulent drainage from wound, increased redness or drainage from wound, foul odor from the wound/dressing, or calf pain - CONTACT YOUR SURGEON.                                                   FOLLOW-UP APPOINTMENTS Make sure you keep all of your appointments after your operation with your surgeon and caregivers. You should call the office at the above phone number and make an appointment for approximately two weeks after the date of your surgery or on the date instructed by your surgeon outlined in the "After Visit Summary".  RANGE OF MOTION AND STRENGTHENING EXERCISES  Rehabilitation of the knee is important following a knee injury or an operation. After just a few days of immobilization, the muscles of the thigh which control the knee become weakened and shrink (atrophy). Knee  exercises are designed to build up the tone and strength of the thigh muscles and to improve knee motion. Often times heat used for twenty to thirty minutes before working out will loosen up your tissues and help with improving the range of motion but do not use heat for the first two weeks following surgery. These exercises can be done on a training (exercise) mat, on the floor, on a table or on a bed. Use what ever works the best and is most comfortable for you Knee exercises include:  . Leg Lifts - While your knee is still immobilized in a splint or cast, you can do straight leg raises. Lift the leg to 60 degrees, hold for 3 sec, and slowly lower the leg. Repeat 10-20 times 2-3 times daily. Perform this exercise against resistance later as your knee gets better.  . Quad and Hamstring Sets - Tighten up the muscle on the front of the thigh (Quad) and hold for 5-10 sec. Repeat this 10-20 times hourly. Hamstring sets are done by pushing the foot backward against an object and holding for 5-10 sec. Repeat as with quad sets.   Leg Slides: Lying on your back, slowly slide your foot toward your buttocks, bending your knee up off the floor (only go as far as is comfortable). Then slowly slide your foot back down until your leg is flat on the floor again.  Angel Wings: Lying on your back spread your legs to the side as far apart as you can without causing discomfort.  A rehabilitation program following serious knee injuries can speed recovery and prevent re-injury in the future due to weakened muscles. Contact your doctor or a physical therapist for more information on knee rehabilitation.   IF YOU ARE TRANSFERRED TO A SKILLED REHAB FACILITY If the patient is transferred to a skilled rehab facility following release from the hospital, a list of the current medications will be sent to the facility for the patient to continue.  When discharged from the skilled rehab facility, please have the facility set up the  patient's Home Health Physical Therapy prior to being released. Also, the skilled facility will be responsible for providing the patient with their medications at time of release from the facility to include their pain medication, the muscle relaxants, and their blood thinner medication. If the patient is still at the   rehab facility at time of the two week follow up appointment, the skilled rehab facility will also need to assist the patient in arranging follow up appointment in our office and any transportation needs.  MAKE SURE YOU:  . Understand these instructions.  . Get help right away if you are not doing well or get worse.   DENTAL ANTIBIOTICS:  In most cases prophylactic antibiotics for Dental procdeures after total joint surgery are not necessary.  Exceptions are as follows:  1. History of prior total joint infection  2. Severely immunocompromised (Organ Transplant, cancer chemotherapy, Rheumatoid biologic meds such as Humera)  3. Poorly controlled diabetes (A1C &gt; 8.0, blood glucose over 200)  If you have one of these conditions, contact your surgeon for an antibiotic prescription, prior to your dental procedure.    Pick up stool softner and laxative for home use following surgery while on pain medications. Do not submerge incision under water. Please use good hand washing techniques while changing dressing each day. May shower starting three days after surgery. Please use a clean towel to pat the incision dry following showers. Continue to use ice for pain and swelling after surgery. Do not use any lotions or creams on the incision until instructed by your surgeon.  

## 2020-07-16 NOTE — Anesthesia Procedure Notes (Signed)
Anesthesia Regional Block: Adductor canal block   Pre-Anesthetic Checklist: ,, timeout performed, Correct Patient, Correct Site, Correct Laterality, Correct Procedure,, site marked, risks and benefits discussed, Surgical consent,  Pre-op evaluation,  At surgeon's request and post-op pain management  Laterality: Left  Prep: chloraprep       Needles:  Injection technique: Single-shot  Needle Type: Echogenic Stimulator Needle     Needle Length: 10cm  Needle Gauge: 20     Additional Needles:   Procedures:,,,, ultrasound used (permanent image in chart),,,,  Narrative:  Start time: 07/16/2020 7:40 AM End time: 07/16/2020 7:50 AM Injection made incrementally with aspirations every 5 mL.  Performed by: Personally  Anesthesiologist: Murvin Natal, MD  Additional Notes: Functioning IV was confirmed and monitors were applied. A time-out was performed. Hand hygiene and sterile gloves were used. The thigh was placed in a frog-leg position and prepped in a sterile fashion. A 140mm 20ga BBraun echogenic stimulator needle was placed using ultrasound guidance.  Negative aspiration and negative test dose prior to incremental administration of local anesthetic. The patient tolerated the procedure well.

## 2020-07-17 ENCOUNTER — Encounter (HOSPITAL_COMMUNITY): Payer: Self-pay | Admitting: Orthopedic Surgery

## 2020-07-17 DIAGNOSIS — M1712 Unilateral primary osteoarthritis, left knee: Secondary | ICD-10-CM | POA: Diagnosis not present

## 2020-07-17 LAB — BASIC METABOLIC PANEL
Anion gap: 7 (ref 5–15)
BUN: 19 mg/dL (ref 8–23)
CO2: 26 mmol/L (ref 22–32)
Calcium: 8.6 mg/dL — ABNORMAL LOW (ref 8.9–10.3)
Chloride: 101 mmol/L (ref 98–111)
Creatinine, Ser: 0.8 mg/dL (ref 0.44–1.00)
GFR, Estimated: >60 mL/min (ref >60)
Glucose, Bld: 161 mg/dL — ABNORMAL HIGH (ref 70–99)
Potassium: 4.4 mmol/L (ref 3.5–5.1)
Sodium: 134 mmol/L — ABNORMAL LOW (ref 135–145)

## 2020-07-17 LAB — CBC
HCT: 29.9 % — ABNORMAL LOW (ref 36.0–46.0)
Hemoglobin: 9.7 g/dL — ABNORMAL LOW (ref 12.0–15.0)
MCH: 31.4 pg (ref 26.0–34.0)
MCHC: 32.4 g/dL (ref 30.0–36.0)
MCV: 96.8 fL (ref 80.0–100.0)
Platelets: 268 10*3/uL (ref 150–400)
RBC: 3.09 MIL/uL — ABNORMAL LOW (ref 3.87–5.11)
RDW: 12 % (ref 11.5–15.5)
WBC: 6.8 10*3/uL (ref 4.0–10.5)
nRBC: 0 % (ref 0.0–0.2)

## 2020-07-17 MED ORDER — METHOCARBAMOL 500 MG PO TABS: 500.0000 mg | ORAL_TABLET | Freq: Four times a day (QID) | ORAL | 0 refills | Status: DC | PRN

## 2020-07-17 MED ORDER — ASPIRIN 325 MG PO TBEC: 325.0000 mg | DELAYED_RELEASE_TABLET | Freq: Two times a day (BID) | ORAL | 0 refills | Status: AC

## 2020-07-17 MED ORDER — OXYCODONE HCL 5 MG PO TABS: 5.0000 mg | ORAL_TABLET | Freq: Four times a day (QID) | ORAL | 0 refills | Status: DC | PRN

## 2020-07-17 MED ORDER — OXYCODONE HCL 5 MG PO TABS
10.0000 mg | ORAL_TABLET | ORAL | Status: DC | PRN
Start: 2020-07-17 — End: 2020-07-17
  Administered 2020-07-17 (×2): 15 mg via ORAL
  Filled 2020-07-17 (×2): qty 3

## 2020-07-17 NOTE — Progress Notes (Signed)
PHYSICAL THERAPY  Attempted to see pt at 1:30.  She was back in bed instantly complaining that "no one came to help me" and that she was "left in the chair for over 45 min".  During am session, I put her in chair and told her I would be back after lunch for another PT session.  Made sure she was comfortable with multiple pillows and at that time she agreed to stay up "till Therapy comes back" which would have been no more than 1.5 hours. Pt did not recall that agreement and shared her displeasure "of the care here".  Pt also c/o "they won't let me sleep".  Pt also c/o 10/10 pain and asked if I could come back later.  "Of course" but "lets address your pain now" and plan ahead.  RN came to room to assist and also offered pain management options.  At this point, pt's anxiety had increased and she had difficulty with decision  making and focusing.  RN gave repeated instructions on her options to control pt's pain and Therapist agreed to return later.  Rica Koyanagi  PTA Acute  Rehabilitation Services Pager      [phone] Office      [phone]

## 2020-07-17 NOTE — Progress Notes (Signed)
RN reviewed discharge instructions with patient and family. All questions answered.   Paperwork given. DME sent with patient. Prescriptions electronically sent to patient pharmacy.    NT rolled patient down with all belongings to family car.    Benedetto Goad, RN

## 2020-07-17 NOTE — Progress Notes (Signed)
Subjective: 1 Day Post-Op Procedure(s) (LRB): TOTAL KNEE ARTHROPLASTY (Left) Patient reports pain as mild.   Patient seen in rounds by Dr. Wynelle Link. Patient is well, and has had no acute complaints or problems. No issues overnight. Foley catheter to be removed this AM. We will continue therapy today.   Objective: Vital signs in last 24 hours: Temp:  [97.7 F (36.5 C)-98.6 F (37 C)] 98.6 F (37 C) (03/22 0201) Pulse Rate:  [56-90] 68 (03/22 0201) Resp:  [11-18] 16 (03/22 0201) BP: (103-157)/(60-109) 112/60 (03/22 0201) SpO2:  [96 %-100 %] 99 % (03/22 0201)  Intake/Output from previous day:  Intake/Output Summary (Last 24 hours) at 07/17/2020 0728 Last data filed at 07/17/2020 4742 Gross per 24 hour  Intake 3646.98 ml  Output 4275 ml  Net -628.02 ml     Intake/Output this shift: No intake/output data recorded.  Labs: Recent Labs    07/17/20 0241  HGB 9.7*   Recent Labs    07/17/20 0241  WBC 6.8  RBC 3.09*  HCT 29.9*  PLT 268   Recent Labs    07/17/20 0241  NA 134*  K 4.4  CL 101  CO2 26  BUN 19  CREATININE 0.80  GLUCOSE 161*  CALCIUM 8.6*   No results for input(s): LABPT, INR in the last 72 hours.  Exam: General - Patient is Alert and Oriented Extremity - Neurologically intact Neurovascular intact Sensation intact distally Dorsiflexion/Plantar flexion intact Dressing - dressing C/D/I Motor Function - intact, moving foot and toes well on exam.   Past Medical History:  Diagnosis Date  . ADD (attention deficit disorder)   . Anxiety   . Celiac disease    possible vs IBS  . Complication of anesthesia   . Coronary artery disease   . Depression    no bipolar per Dr. [name]  . GI problem    [name]  . Gluten intolerance   . Hyperlipidemia   . IBS (irritable bowel syndrome)   . Osteoarthritis   . Osteoporosis   . PONV (postoperative nausea and vomiting)   . Rheumatoid arthritis ([place])   . Seasonal allergies 01-08-12   hx. of multiple  bronchitis related to this.  . Shoulder pain, right     Assessment/Plan: 1 Day Post-Op Procedure(s) (LRB): TOTAL KNEE ARTHROPLASTY (Left) Principal Problem:   OA (osteoarthritis) of knee Active Problems:   Primary osteoarthritis of left knee  Estimated body mass index is 22.24 kg/m as calculated from the following:   Height as of this encounter: 4' 11.5" (1.511 m).   Weight as of this encounter: 50.8 kg. Advance diet Up with therapy D/C IV fluids   Patient's anticipated LOS is less than 2 midnights, meeting these requirements: - Younger than 106 - Lives within 1 hour of care - Has a competent adult at home to recover with post-op recover - NO history of  - Chronic pain requiring opiods  - Diabetes  - Coronary Artery Disease  - Heart failure  - Heart attack  - Stroke  - DVT/VTE  - Cardiac arrhythmia  - Respiratory Failure/COPD  - Renal failure  - Anemia  - Advanced Liver disease  DVT Prophylaxis - Aspirin Weight bearing as tolerated. Continue therapy.  Plan is to go Home after hospital stay. Plan for discharge once cleared by PT today.  Scheduled for OPPT Follow-up in the office in 2 weeks  The PDMP database was reviewed today prior to any opioid medications being prescribed to this patient.  Theresa Duty, PA-C Orthopedic Surgery [phone] 07/17/2020, 7:28 AM

## 2020-07-17 NOTE — TOC Transition Note (Signed)
Transition of Care [facility]) - CM/SW Discharge Note   Patient Details  Name: Courtney [name] MRN: [id number] Date of Birth: 06-13-1950  [facility] CM/SW Contact:  Lennart Pall, LCSW Phone Number: 07/17/2020, 9:54 AM   Clinical Narrative:    Met briefly with pt and confirming plans for OPPT at [facility] and [facility] via [facility].  No TOC needs.   Final next level of care: OP Rehab Barriers to Discharge: No Barriers Identified   Patient Goals and CMS Choice Patient states their goals for this hospitalization and ongoing recovery are:: return home      Discharge Placement                       Discharge Plan and Services                DME Arranged: Gilford Rile rolling,3-N-1 DME Agency: [employer]       HH Arranged: NA HH Agency: NA        Social Determinants of Health (SDOH) Interventions     Readmission Risk Interventions No flowsheet data found.

## 2020-07-17 NOTE — Progress Notes (Signed)
Physical Therapy Treatment Patient Details Name: Courtney [name] MRN: [id number] DOB: 08-19-1950 Today's Date: 07/17/2020    History of Present Illness Patient is 70 y.o. 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    PT Comments    POD # 1 pm session General Comments: anxiety continues to increase as pt was on phone leaving a frantic message for [place].  "I don't know how I am suppose to function".  "the pain is better but I can't relax enough to sleep".  "my mind is everywhere"  Pt required 3 to 4 repeat directions before she was able to focus, process and retain.  Pt multi step jumping ahead and lacking focus.  Her anxiety appears to increase with pain and frustration.  Pt required an extended treatment session to ensure she retains instructions on how to use the walker correctly, how to self assist her leg on/off bed using her strap, how to safely navigate stairs. Assisted OOB.  General bed mobility comments: had pt use strap to self assist L LE off/onto bed with 3 to 4 repeat cues on proper tech, gently and safely.  Pt impulsive and present with impaired cognition along with HIGH anxiety. General transfer comment: still required 75% VC's on proper tech, proper hand placement, safety with turns and to move slowly to minimize pain.  Pt c/o "I can't push up with this shoulder". indicating R shoulder. Then 5 min later, "how do I do this?"  Pt appears easily frustrated and anxious but with increased session time and instruction was able to retain and recollect. General Gait Details: Pt declined to amb in hallway.  "They made me wqalk to the bathroom, that counts".  With me pt did amb a total of 12 feet to and from wheelchair to stairs.  Again, required 3 to 4 repeat VC's on proper walker use, proper walker distance to self and safety with turns.  With increased time and instruction, pt was able to  retain and perform correctly. General stair comments: pt required increased time and repeat cueing on proper tech to navigate 6 steps using one Left rail and one R crutch.  Again, easlit frustrated and nervous, she needed step by step direction.  Also given handout on proper sequencing.  Stair training took approx 15 min to complete due to pt's anxiety and impaired cognition. Pt returned to room.  Youth walker delivered.  Issued one crutch for "stairs only" instructed to pt.  Has HEP handout from am session.   RN notified pt is "ready to go home".    Follow Up Recommendations  Outpatient PT;Follow surgeon's recommendation for DC plan and follow-up therapies     Equipment Recommendations  Rolling walker with 5" wheels;3in1 (PT)    Recommendations for Other Services       Precautions / Restrictions Precautions Precautions: Fall Precaution Comments: instructed no pillow under knee Restrictions Weight Bearing Restrictions: No LLE Weight Bearing: Weight bearing as tolerated Other Position/Activity Restrictions: WBAT    Mobility  Bed Mobility Overal bed mobility: Needs Assistance Bed Mobility: Supine to Sit;Sit to Supine     Supine to sit: Supervision Sit to supine: Supervision   General bed mobility comments: had pt use strap to self assist L LE off/onto bed with 3 to 4 repeat cues on proper tech, gently and safely.  Pt impulsive and present with impaired cognition along with HIGH anxiety.    Transfers  Overall transfer level: Needs assistance Equipment used: Rolling walker (2 wheeled) Transfers: Sit to/from [facility] Sit to Stand: Supervision Stand pivot transfers: Supervision;Min guard       General transfer comment: still required 75% VC's on proper tech, proper hand placement, safety with turns and to move slowly to minimize pain.  Pt c/o "I can't push up with this shoulder". indicating R shoulder. Then 5 min later, "how do I do this?"  Pt appears easily  frustrated and anxious but with increased session time and instruction was able to retain and recollect.  Ambulation/Gait Ambulation/Gait assistance: Supervision Gait Distance (Feet): 12 Feet Assistive device: Rolling walker (2 wheeled) Gait Pattern/deviations: Step-to pattern;Decreased stride length;Decreased weight shift to left Gait velocity: decr   General Gait Details: Pt declined to amb in hallway.  "They made me wqalk to the bathroom, that counts".  With me pt did amb a total of 12 feet to and from wheelchair to stairs.  Again, required 3 to 4 repeat VC's on proper walker use, proper walker distance to self and safety with turns.  With increased time and instruction, pt was able to retain and perform correctly.   Stairs Stairs: Yes Stairs assistance: Min assist Stair Management: One rail Left;Step to pattern;With crutches;Forwards Number of Stairs: 6 General stair comments: pt required increased time and repeat cueing on proper tech to navigate 6 steps using one Left rail and one R crutch.  Again, easlit frustrated and nervous, she needed step by step direction.  Also given handout on proper sequencing.  Stair training took approx 15 min to complete due to pt's anxiety and impaired cognition.   Wheelchair Mobility    Modified Rankin (Stroke Patients Only)       Balance                                            Cognition Arousal/Alertness: Awake/alert Behavior During Therapy: Anxious Overall Cognitive Status: Within Functional Limits for tasks assessed                                 General Comments: anxiety continues to increase as pt was on phone leaving a message for [place].  "I don't know how I am suppose to function".  "the pain is better but I can't relax enough to sleep".  "my mind is everywhere"  Pt required 3 to 4 repeat directions before she was able to focus, process and retain.  Pt multi step jumping ahead and lacking focus.  Her  anxiety appears to increase with pain and frustration.  Pt required an extended treatment session to ensure she retains instructions on how to use the walker correctly, how to self assist her leg on/off bed using her strap, how to safely navigate stairs.      Exercises      General Comments        Pertinent Vitals/Pain Pain Assessment: 0-10 Pain Score: 5  Pain Location: Lt knee Pain Descriptors / Indicators: Aching;Grimacing;Guarding Pain Intervention(s): Monitored during session;Premedicated before session;Repositioned;Ice applied    Home Living                      Prior Function            PT Goals (current goals can now be found in  the care plan section) Progress towards PT goals: Progressing toward goals    Frequency    7X/week      PT Plan Current plan remains appropriate    Co-evaluation              AM-PAC PT "6 Clicks" Mobility   Outcome Measure  Help needed turning from your back to your side while in a flat bed without using bedrails?: None Help needed moving from lying on your back to sitting on the side of a flat bed without using bedrails?: A Little Help needed moving to and from a bed to a chair (including a wheelchair)?: A Little Help needed standing up from a chair using your arms (e.g., wheelchair or bedside chair)?: A Little Help needed to walk in hospital room?: A Little Help needed climbing 3-5 steps with a railing? : A Lot 6 Click Score: 18    End of Session Equipment Utilized During Treatment: Gait belt Activity Tolerance: Patient tolerated treatment well Patient left: in chair;with call bell/phone within reach;with chair alarm set;with nursing/sitter in room Nurse Communication: Mobility status (pt declines to stay another night and wants to go home today "I have to go home today and get some sleep".  "Lack of sleep is making me crazy".) PT Visit Diagnosis: Muscle weakness (generalized) (M62.81);Difficulty in walking, not  elsewhere classified (R26.2);Pain Pain - Right/Left: Left Pain - part of body: Knee     Time: 7703-4035 PT Time Calculation (min) (ACUTE ONLY): 55 min  Charges:  $Gait Training: 23-37 mins $Therapeutic Exercise: 8-22 mins $Therapeutic Activity: 8-22 mins $Self Care/Home Management: 8-22                     {Joshlynn Alfonzo  PTA Acute  Rehabilitation Services Pager      [phone] Office      [phone]

## 2020-07-17 NOTE — Progress Notes (Signed)
Physical Therapy Treatment Patient Details Name: Courtney [name] MRN: [id number] DOB: [birth year] Today's Date: 07/17/2020    History of Present Illness Patient is 70 y.o. 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    PT Comments    POD # 1 am session General Comments: AxO X 3 very pleasant lady but present with much anxiety, worry and slight distration,  difficulty focus to take at present.  Requires increased time for explainations and quick to "jump" to next question. Assisted OOB. General bed mobility comments: assist with L LE and 50% VC's on proper tech and safety as pt attempted to "jump" out of bed before instructions were complete then causing increased knee pain because she did not follow.  Instructed pt to "slow down" and sit EOB a few min "to adjust".  General transfer comment: 75% repeat VC's on safety esp with hand placement and turns.  Assisted to bathroom.  Pt with tendency to "jump" and leave walker.  75% VC's on proper walker use, turn completion and safety with backward gait to toilet.  Again, pt anxious.  Risk for falls. General Gait Details: amb from bed to bathroom and to recliner only this morning due to increased pain in L knee as well as increased discomfort in her L shoulder.  Pt stated that's her "bad shoulder" and stated "walking on walker was increasing" her shoulder discomfort.  Also noted walker was too tall and pt declined to attempt gait in hallway with "the wrong walker". Performed a few TE's following HEP but limited by pain.  Applied ICE. Pt will need another PT session to address stairs and get her to amb in hallway before D/C to home.    Follow Up Recommendations  Outpatient PT;Follow surgeon's recommendation for DC plan and follow-up therapies     Equipment Recommendations  Rolling walker with 5" wheels;3in1 (PT) (youth)    Recommendations for  Other Services       Precautions / Restrictions Precautions Precautions: Fall Restrictions Weight Bearing Restrictions: No Other Position/Activity Restrictions: WBAT    Mobility  Bed Mobility Overal bed mobility: Needs Assistance Bed Mobility: Supine to Sit           General bed mobility comments: assist with L LE and 50% VC's on proper tech and safety as pt attempted to "jump" out of bed before instructions were complete then causing increased knee pain because she did not follow.  Instructed pt to "slow down" and sit EOB a few min "to adjust".    Transfers Overall transfer level: Needs assistance Equipment used: Rolling walker (2 wheeled) Transfers: Sit to/from [facility] Sit to Stand: Min assist Stand pivot transfers: Min assist;Mod assist       General transfer comment: 75% repeat VC's on safety esp with hand placement and turns.  Assisted to bathroom.  Pt with tendency to "jump" and leave walker.  75% VC's on proper walker use, turn completion and safety with backward gait to toilet.  Again, pt anxious.  Risk for falls.  Ambulation/Gait Ambulation/Gait assistance: Min assist Gait Distance (Feet): 22 Feet Assistive device: Rolling walker (2 wheeled) Gait Pattern/deviations: Step-to pattern;Decreased stride length;Decreased weight shift to left Gait velocity: decr   General Gait Details: amb from bed to bathroom and to recliner only this morning due to increased pain in L knee as well as increased discomfort in her L shoulder.  Pt stated that's her "  bad shoulder" and stated "walking on walker was increasing" her shoulder discomfort.  Also noted walker was too tall and pt declined to attempt gait in hallway with "the wrong walker".   Stairs             Wheelchair Mobility    Modified Rankin (Stroke Patients Only)       Balance                                            Cognition Arousal/Alertness:  Awake/alert Behavior During Therapy: Anxious Overall Cognitive Status: Within Functional Limits for tasks assessed                                 General Comments: AxO X 3 very pleasant lady but present with much anxiety, worry and slight distration,  difficulty focus to take at present.  Requires increased time for explainations and quick to "jump" to next question.      Exercises   Total Knee Replacement TE's following HEP handout 10 reps B LE ankle pumps 05 reps towel squeezes 05 reps knee presses 05 reps heel slides  05 reps SAQ's 05 reps SLR's 05 reps ABD Educated on use of gait belt to assist with TE's Followed by ICE     General Comments        Pertinent Vitals/Pain Pain Assessment: 0-10 Pain Score: 7  Pain Location: Lt knee Pain Descriptors / Indicators: Aching;Grimacing;Guarding Pain Intervention(s): Monitored during session;Premedicated before session;Repositioned;Ice applied    Home Living                      Prior Function            PT Goals (current goals can now be found in the care plan section) Progress towards PT goals: Progressing toward goals    Frequency    7X/week      PT Plan Current plan remains appropriate    Co-evaluation              AM-PAC PT "6 Clicks" Mobility   Outcome Measure  Help needed turning from your back to your side while in a flat bed without using bedrails?: None Help needed moving from lying on your back to sitting on the side of a flat bed without using bedrails?: A Little Help needed moving to and from a bed to a chair (including a wheelchair)?: A Little Help needed standing up from a chair using your arms (e.g., wheelchair or bedside chair)?: A Little Help needed to walk in hospital room?: A Little Help needed climbing 3-5 steps with a railing? : A Lot 6 Click Score: 18    End of Session Equipment Utilized During Treatment: Gait belt Activity Tolerance: Patient tolerated  treatment well Patient left: in chair;with call bell/phone within reach;with chair alarm set;with nursing/sitter in room Nurse Communication: Mobility status PT Visit Diagnosis: Muscle weakness (generalized) (M62.81);Difficulty in walking, not elsewhere classified (R26.2);Pain Pain - Right/Left: Left Pain - part of body: Knee     Time: 1050-1130 PT Time Calculation (min) (ACUTE ONLY): 40 min  Charges:  $Gait Training: 8-22 mins $Therapeutic Exercise: 8-22 mins $Therapeutic Activity: 8-22 mins                     Rica Koyanagi  PTA Acute  Rehabilitation Services Pager      [phone] Office      [phone]

## 2020-07-17 NOTE — Plan of Care (Signed)
  Problem: Education: Goal: Knowledge of General Education information will improve Description Including pain rating scale, medication(s)/side effects and non-pharmacologic comfort measures Outcome: Progressing   

## 2020-07-17 NOTE — Progress Notes (Signed)
PHYSICAL THERAPY  Attempted to see pt at 3:15.  Pain is better but her anxiety was still high. Pt asked that I return later because she was trying to get some sleep.  "I'm not kidding, there has been seven different people in and out of here since you left".  "and they made me walk to the bathroom".  "I just have to get some rest".  We agreed on 4 pm for her second session.  Pt wants to go home today because "I am not getting any rest here".  I will return at 4 pm as pt has 7 stairs to enter her home.  Rica Koyanagi  PTA Acute  Rehabilitation Services Pager      [phone] Office      [phone]

## 2020-07-19 ENCOUNTER — Other Ambulatory Visit: Payer: Self-pay | Admitting: Psychiatry

## 2020-07-19 DIAGNOSIS — F411 Generalized anxiety disorder: Secondary | ICD-10-CM

## 2020-07-21 DIAGNOSIS — M25562 Pain in left knee: Secondary | ICD-10-CM | POA: Diagnosis not present

## 2020-07-21 DIAGNOSIS — R269 Unspecified abnormalities of gait and mobility: Secondary | ICD-10-CM | POA: Diagnosis not present

## 2020-07-23 NOTE — Discharge Summary (Signed)
Physician Discharge Summary   Patient ID: Courtney [name] MRN: [id number] DOB/AGE: April 11, 1951 70 y.o.  Admit date: 07/16/2020 Discharge date: 07/17/2020  Primary Diagnosis: Osteoarthritis, left knee   Admission Diagnoses:  Past Medical History:  Diagnosis Date  . ADD (attention deficit disorder)   . Anxiety   . Celiac disease    possible vs IBS  . Complication of anesthesia   . Coronary artery disease   . Depression    no bipolar per Dr. [name]  . GI problem    [name]  . Gluten intolerance   . Hyperlipidemia   . IBS (irritable bowel syndrome)   . Osteoarthritis   . Osteoporosis   . PONV (postoperative nausea and vomiting)   . Rheumatoid arthritis ([place])   . Seasonal allergies 01-08-12   hx. of multiple bronchitis related to this.  . Shoulder pain, right    Discharge Diagnoses:   Principal Problem:   OA (osteoarthritis) of knee Active Problems:   Primary osteoarthritis of left knee  Estimated body mass index is 22.24 kg/m as calculated from the following:   Height as of this encounter: 4' 11.5" (1.511 m).   Weight as of this encounter: 50.8 kg.  Procedure:  Procedure(s) (LRB): TOTAL KNEE ARTHROPLASTY (Left)   Consults: None  HPI: Courtney [name] is a 70 y.o. year old female with end stage OA of her left knee with progressively worsening pain and dysfunction. She has constant pain, with activity and at rest and significant functional deficits with difficulties even with ADLs. She has had extensive non-op management including analgesics, injections of cortisone and viscosupplements, and home exercise program, but remains in significant pain with significant dysfunction. Radiographs show bone on bone arthritis medial and patellofemoral. She presents now for left Total Knee Arthroplasty.    Laboratory Data: Admission on 07/16/2020, Discharged on 07/17/2020  Component Date Value Ref Range Status  . WBC 07/17/2020 6.8  4.0 - 10.5 K/uL Final  . RBC 07/17/2020  3.09* 3.87 - 5.11 MIL/uL Final  . Hemoglobin 07/17/2020 9.7* 12.0 - 15.0 g/dL Final  . HCT 07/17/2020 29.9* 36.0 - 46.0 % Final  . MCV 07/17/2020 96.8  80.0 - 100.0 fL Final  . MCH 07/17/2020 31.4  26.0 - 34.0 pg Final  . MCHC 07/17/2020 32.4  30.0 - 36.0 g/dL Final  . RDW 07/17/2020 12.0  11.5 - 15.5 % Final  . Platelets 07/17/2020 268  150 - 400 K/uL Final  . nRBC 07/17/2020 0.0  0.0 - 0.2 % Final   Performed at [facility], [place] [address] [place], [place] [address]  . Sodium 07/17/2020 134* 135 - 145 mmol/L Final  . Potassium 07/17/2020 4.4  3.5 - 5.1 mmol/L Final  . Chloride 07/17/2020 101  98 - 111 mmol/L Final  . CO2 07/17/2020 26  22 - 32 mmol/L Final  . Glucose, Bld 07/17/2020 161* 70 - 99 mg/dL Final   Glucose reference range applies only to samples taken after fasting for at least 8 hours.  . BUN 07/17/2020 19  8 - 23 mg/dL Final  . Creatinine, Ser 07/17/2020 0.80  0.44 - 1.00 mg/dL Final  . Calcium 07/17/2020 8.6* 8.9 - 10.3 mg/dL Final  . GFR, Estimated 07/17/2020 >60  >60 mL/min Final   Comment: (NOTE) Calculated using the CKD-EPI Creatinine Equation (2021)   . Anion gap 07/17/2020 7  5 - 15 Final   Performed at [facility], [place] [address] [place], [place] [address]  Hospital Outpatient Visit  on 07/12/2020  Component Date Value Ref Range Status  . SARS Coronavirus 2 07/12/2020 NEGATIVE  NEGATIVE Final   Comment: (NOTE) SARS-CoV-2 target nucleic acids are NOT DETECTED.  The SARS-CoV-2 RNA is generally detectable in upper and lower respiratory specimens during the acute phase of infection. Negative results do not preclude SARS-CoV-2 infection, do not rule out co-infections with other pathogens, and should not be used as the sole basis for treatment or other patient management decisions. Negative results must be combined with clinical observations, patient history, and epidemiological information. The expected result is  Negative.  Fact Sheet for Patients: SugarRoll.be  Fact Sheet for Healthcare Providers: https://www.woods-mathews.com/  This test is not yet approved or cleared by the [place] FDA and  has been authorized for detection and/or diagnosis of SARS-CoV-2 by FDA under an Emergency Use Authorization (EUA). This EUA will remain  in effect (meaning this test can be used) for the duration of the COVID-19 declaration under Se                          ction 564(b)(1) of the Act, 21 U.S.C. section 360bbb-3(b)(1), unless the authorization is terminated or revoked sooner.  Performed at [facility], [place] [address], [place], [place] [address]   Hospital Outpatient Visit on 07/06/2020  Component Date Value Ref Range Status  . MRSA, PCR 07/06/2020 NEGATIVE  NEGATIVE Final  . Staphylococcus aureus 07/06/2020 POSITIVE* NEGATIVE Final   Comment: (NOTE) The Xpert SA Assay (FDA approved for NASAL specimens in patients 76 years of age and older), is one component of a comprehensive surveillance program. It is not intended to diagnose infection nor to guide or monitor treatment. Performed at [facility], [place] [address] [place], [place] [address]   . WBC 07/06/2020 4.5  4.0 - 10.5 K/uL Final  . RBC 07/06/2020 4.39  3.87 - 5.11 MIL/uL Final  . Hemoglobin 07/06/2020 13.7  12.0 - 15.0 g/dL Final  . HCT 07/06/2020 42.8  36.0 - 46.0 % Final  . MCV 07/06/2020 97.5  80.0 - 100.0 fL Final  . MCH 07/06/2020 31.2  26.0 - 34.0 pg Final  . MCHC 07/06/2020 32.0  30.0 - 36.0 g/dL Final  . RDW 07/06/2020 12.5  11.5 - 15.5 % Final  . Platelets 07/06/2020 295  150 - 400 K/uL Final  . nRBC 07/06/2020 0.0  0.0 - 0.2 % Final   Performed at [facility], [place] [address], [place], [place] [address]  . Sodium 07/06/2020 132* 135 - 145 mmol/L Final  . Potassium 07/06/2020 4.9  3.5 - 5.1 mmol/L Final  . Chloride 07/06/2020 100  98 -  111 mmol/L Final  . CO2 07/06/2020 21* 22 - 32 mmol/L Final  . Glucose, Bld 07/06/2020 101* 70 - 99 mg/dL Final   Glucose reference range applies only to samples taken after fasting for at least 8 hours.  . BUN 07/06/2020 14  8 - 23 mg/dL Final  . Creatinine, Ser 07/06/2020 0.66  0.44 - 1.00 mg/dL Final  . Calcium 07/06/2020 9.3  8.9 - 10.3 mg/dL Final  . Total Protein 07/06/2020 6.8  6.5 - 8.1 g/dL Final  . Albumin 07/06/2020 3.9  3.5 - 5.0 g/dL Final  . AST 07/06/2020 21  15 - 41 U/L Final  . ALT 07/06/2020 13  0 - 44 U/L Final  . Alkaline Phosphatase 07/06/2020 49  38 - 126 U/L Final  . Total  Bilirubin 07/06/2020 0.5  0.3 - 1.2 mg/dL Final  . GFR, Estimated 07/06/2020 >60  >60 mL/min Final   Comment: (NOTE) Calculated using the CKD-EPI Creatinine Equation (2021)   . Anion gap 07/06/2020 11  5 - 15 Final   Performed at [facility], [place] [address], [place], [place] [address]  . Prothrombin Time 07/06/2020 12.5  11.4 - 15.2 seconds Final  . INR 07/06/2020 1.0  0.8 - 1.2 Final   Comment: (NOTE) INR goal varies based on device and disease states. Performed at [facility], [place] [address], [place], [place] [address]   . aPTT 07/06/2020 33  24 - 36 seconds Final   Performed at [facility], [place] [address], [place], [place] [address]  . ABO/RH(D) 07/06/2020 O POS   Final  . Antibody Screen 07/06/2020 NEG   Final  . Sample Expiration 07/06/2020 07/19/2020,2359   Final  . Extend sample reason 07/06/2020    Final                   Value:NO TRANSFUSIONS OR PREGNANCY IN THE PAST 3 MONTHS Performed at [facility], [place] [address], [place], [place] [address]   Office Visit on 06/20/2020  Component Date Value Ref Range Status  . Estradiol 06/20/2020 49  pg/mL Final   Comment:       Reference Range         Follicular Phase:    83-662         Mid-Cycle:           64-357         Luteal Phase:        94-765          Postmenopausal:      < or = 31 . Reference range established on post-pubertal patient population. No pre-pubertal reference range established using this assay. For any patients for whom low Estradiol levels are anticipated (e.g. males, pre-pubertal children and hypogonadal/post-menopausal  females), the [employer] Estradiol, Ultrasensitive, LCMSMS assay is recommended (order code [id number]). . Please note: patients being treated with the drug  fulvestrant (Faslodex(R)) have demonstrated significant  interference in immunoassay methods for estradiol  measurement. The cross reactivity could lead to falsely  elevated estradiol test results leading to an  inappropriate clinical assessment of estrogen status. [facility] order code [id number],  Ultrasensitive LC/MS/MS demonstrates negligible cross  re                          activity with fulvestrant.   . Progesterone 06/20/2020 14.6  ng/mL Final   Comment:             Reference Ranges          Female             Follicular Phase     < 1.0             Luteal Phase      2.6-21.5             Post menopausal      < 0.5             Pregnancy             1st Trimester     4.1-34.0             2nd Trimester    24.0-76.0  3rd Trimester   52.0-302.0   Lab on 06/11/2020  Component Date Value Ref Range Status  . Cholesterol, Total 06/11/2020 223* 100 - 199 mg/dL Final  . Triglycerides 06/11/2020 61  0 - 149 mg/dL Final  . HDL 06/11/2020 119  >39 mg/dL Final  . VLDL Cholesterol Cal 06/11/2020 11  5 - 40 mg/dL Final  . LDL Chol Calc (NIH) 06/11/2020 93  0 - 99 mg/dL Final  . Chol/HDL Ratio 06/11/2020 1.9  0.0 - 4.4 ratio Final   Comment:                                   T. Chol/HDL Ratio                                             Men  Women                               1/2 Avg.Risk  3.4    3.3                                   Avg.Risk  5.0    4.4                                2X Avg.Risk   9.6    7.1                                3X Avg.Risk 23.4   11.0      X-Rays:No results found.  EKG: Orders placed or performed in visit on 06/13/20  . EKG 12-Lead     Hospital Course: Courtney [name] is a 70 y.o. who was admitted to [facility]. They were brought to the operating room on 07/16/2020 and underwent Procedure(s): TOTAL KNEE ARTHROPLASTY.  Patient tolerated the procedure well and was later transferred to the recovery room and then to the orthopaedic floor for postoperative care. They were given PO and IV analgesics for pain control following their surgery. They were given 24 hours of postoperative antibiotics of  Anti-infectives (From admission, onward)   Start     Dose/Rate Route Frequency Ordered Stop   07/16/20 1400  ceFAZolin (ANCEF) IVPB 2g/100 mL premix        2 g 200 mL/hr over 30 Minutes Intravenous Every 6 hours 07/16/20 1236 07/16/20 2012   07/16/20 0645  ceFAZolin (ANCEF) IVPB 2g/100 mL premix        2 g 200 mL/hr over 30 Minutes Intravenous  Once 07/16/20 0638 07/16/20 0820   07/16/20 0630  vancomycin (VANCOCIN) IVPB 1000 mg/200 mL premix  Status:  Discontinued        1,000 mg 200 mL/hr over 60 Minutes Intravenous On call to O.R. 07/16/20 0622 07/16/20 1501     and started on DVT prophylaxis in the form of Aspirin.   PT and OT were ordered for total joint protocol. Discharge planning consulted to help with postop disposition and equipment needs.  Patient had a good night on the evening of surgery. They  started to get up OOB with therapy on POD #0. Pt was seen during rounds and was ready to go home pending progress with therapy. She worked with therapy on POD #1 and was meeting her goals. Pt was discharged to home later that day in stable condition.  Diet: Regular diet Activity: WBAT Follow-up: in 2 weeks Disposition: Home with OPPT Discharged Condition: stable   Discharge Instructions    Call MD / Call 911   Complete by: As directed    If  you experience chest pain or shortness of breath, CALL 911 and be transported to the hospital emergency room.  If you develope a fever above 101 F, pus (white drainage) or increased drainage or redness at the wound, or calf pain, call your surgeon's office.   Change dressing   Complete by: As directed    You may remove the bulky bandage (ACE wrap and gauze) two days after surgery. You will have an adhesive waterproof bandage underneath. Leave this in place until your first follow-up appointment.   Constipation Prevention   Complete by: As directed    Drink plenty of fluids.  Prune juice may be helpful.  You may use a stool softener, such as Colace (over the counter) 100 mg twice a day.  Use MiraLax (over the counter) for constipation as needed.   Diet - low sodium heart healthy   Complete by: As directed    Do not put a pillow under the knee. Place it under the heel.   Complete by: As directed    Driving restrictions   Complete by: As directed    No driving for two weeks   TED hose   Complete by: As directed    Use stockings (TED hose) for three weeks on both leg(s).  You may remove them at night for sleeping.   Weight bearing as tolerated   Complete by: As directed      Allergies as of 07/17/2020      Reactions   Clarithromycin Nausea Only   Other Other (See Comments)   Bactrim [sulfamethoxazole-trimethoprim] Nausea And Vomiting   Morphine Nausea And Vomiting   Other reaction(s): Nausea/Vomiting   Morphine And Related Nausea And Vomiting   Azithromycin Nausea And Vomiting, Nausea Only   Ceclor [cefaclor] Nausea And Vomiting   Can take Augmentin ok   Codeine Nausea And Vomiting   Cymbalta [duloxetine Hcl]    Erythromycin Nausea And Vomiting   Other reaction(s): Nausea/Vomiting   Erythromycin Base Nausea And Vomiting   Levofloxacin Nausea Only   REACTION: nausea   Loratadine    REACTION: bruises   Nitrofurantoin Nausea Only   Propoxyphene Nausea Only   dizzy   Propoxyphene  N-acetaminophen Nausea Only   dizzy      Medication List    STOP taking these medications   meloxicam 15 MG tablet Commonly known as: MOBIC     TAKE these medications   ALPRAZolam 1 MG tablet Commonly known as: XANAX Take 0.5-1 mg by mouth daily as needed.   amoxicillin 500 MG tablet Commonly known as: AMOXIL Take 2,000 mg by mouth See admin instructions. Take 2000 mg by mouth 1 hour prior to dental appt   aspirin 325 MG EC tablet Take 1 tablet (325 mg total) by mouth 2 (two) times daily for 20 days. Then take one 81 mg aspirin once a day for three weeks. Then discontinue aspirin.   CALCIUM 600+D3 PO Take 1,200 tablets by mouth daily.   estradiol 1  MG tablet Commonly known as: ESTRACE Take 1.5 tablets (1.5 mg total) by mouth daily.   methocarbamol 500 MG tablet Commonly known as: ROBAXIN Take 1 tablet (500 mg total) by mouth every 6 (six) hours as needed for muscle spasms. What changed: when to take this   methylphenidate 54 MG CR tablet Commonly known as: Concerta Take 1 tablet (54 mg total) by mouth every morning.   mupirocin ointment 2 % Commonly known as: BACTROBAN Use in both nares 3 times a day for 5 days   nitroGLYCERIN 0.4 MG SL tablet Commonly known as: NITROSTAT Place 1 tablet (0.4 mg total) under the tongue every 5 (five) minutes as needed for chest pain.   NP Thyroid 60 MG tablet Generic drug: thyroid Take 1 tablet (60 mg total) by mouth daily before breakfast.   omega-3 acid ethyl esters 1 g capsule Commonly known as: Lovaza Take 2 capsules (2 g total) by mouth 2 (two) times daily.   oxyCODONE 5 MG immediate release tablet Commonly known as: Oxy IR/ROXICODONE Take 1-2 tablets (5-10 mg total) by mouth every 6 (six) hours as needed for severe pain.   pravastatin 20 MG tablet Commonly known as: PRAVACHOL Take up to 1 tablet daily as tolerated. What changed:   how much to take  how to take this  when to take this  additional  instructions   pregabalin 75 MG capsule Commonly known as: LYRICA Take 75 mg by mouth 2 (two) times daily.   progesterone 200 MG capsule Commonly known as: PROMETRIUM Take 2 capsules (400 mg total) by mouth daily. What changed: how much to take   Testosterone 1.62 % Gel Place 5 mg onto the skin daily.   traMADol 50 MG tablet Commonly known as: ULTRAM TAKE 1/2 TO 1 TABLET(25 TO 50 MG) BY MOUTH BACK EVERY 8 HOURS AS NEEDED FOR SEVERE PAIN   Viibryd 20 MG Tabs Generic drug: Vilazodone HCl Take 1 tablet (20 mg total) by mouth daily at 12 noon.   Vitamin D 125 MCG (5000 UT) Caps Take 5,000 Units by mouth daily.   vitamin E 180 MG (400 UNITS) capsule Take 800 Units by mouth daily.            Discharge Care Instructions  (From admission, onward)         Start     Ordered   07/17/20 0000  Weight bearing as tolerated        07/17/20 0730   07/17/20 0000  Change dressing       Comments: You may remove the bulky bandage (ACE wrap and gauze) two days after surgery. You will have an adhesive waterproof bandage underneath. Leave this in place until your first follow-up appointment.   07/17/20 0730          Follow-up Information    Gaynelle Arabian, MD. Go on 07/31/2020.   Specialty: Orthopedic Surgery Why: You are scheduled for a post-operative appointment on 07-31-20 at 1:15 pm.  Contact information: [address] [place] [address] [phone]               Signed: Theresa Duty, PA-C Orthopedic Surgery 07/23/2020, 1:54 PM

## 2020-07-27 DIAGNOSIS — R269 Unspecified abnormalities of gait and mobility: Secondary | ICD-10-CM | POA: Diagnosis not present

## 2020-07-27 DIAGNOSIS — M25562 Pain in left knee: Secondary | ICD-10-CM | POA: Diagnosis not present

## 2020-07-30 DIAGNOSIS — R269 Unspecified abnormalities of gait and mobility: Secondary | ICD-10-CM | POA: Diagnosis not present

## 2020-07-30 DIAGNOSIS — M25562 Pain in left knee: Secondary | ICD-10-CM | POA: Diagnosis not present

## 2020-08-01 DIAGNOSIS — R269 Unspecified abnormalities of gait and mobility: Secondary | ICD-10-CM | POA: Diagnosis not present

## 2020-08-01 DIAGNOSIS — M25562 Pain in left knee: Secondary | ICD-10-CM | POA: Diagnosis not present

## 2020-08-06 DIAGNOSIS — R269 Unspecified abnormalities of gait and mobility: Secondary | ICD-10-CM | POA: Diagnosis not present

## 2020-08-06 DIAGNOSIS — M25562 Pain in left knee: Secondary | ICD-10-CM | POA: Diagnosis not present

## 2020-08-08 DIAGNOSIS — M25562 Pain in left knee: Secondary | ICD-10-CM | POA: Diagnosis not present

## 2020-08-08 DIAGNOSIS — R269 Unspecified abnormalities of gait and mobility: Secondary | ICD-10-CM | POA: Diagnosis not present

## 2020-08-13 DIAGNOSIS — M25562 Pain in left knee: Secondary | ICD-10-CM | POA: Diagnosis not present

## 2020-08-13 DIAGNOSIS — R269 Unspecified abnormalities of gait and mobility: Secondary | ICD-10-CM | POA: Diagnosis not present

## 2020-08-14 ENCOUNTER — Other Ambulatory Visit: Payer: Self-pay

## 2020-08-14 ENCOUNTER — Encounter: Payer: Self-pay | Admitting: Psychiatry

## 2020-08-14 ENCOUNTER — Ambulatory Visit (INDEPENDENT_AMBULATORY_CARE_PROVIDER_SITE_OTHER): Payer: Medicare Other | Admitting: Psychiatry

## 2020-08-14 DIAGNOSIS — F902 Attention-deficit hyperactivity disorder, combined type: Secondary | ICD-10-CM | POA: Diagnosis not present

## 2020-08-14 DIAGNOSIS — F411 Generalized anxiety disorder: Secondary | ICD-10-CM

## 2020-08-14 DIAGNOSIS — I2510 Atherosclerotic heart disease of native coronary artery without angina pectoris: Secondary | ICD-10-CM

## 2020-08-14 MED ORDER — METHYLPHENIDATE HCL ER (OSM) 54 MG PO TBCR: 54.0000 mg | EXTENDED_RELEASE_TABLET | ORAL | 0 refills | Status: DC

## 2020-08-14 MED ORDER — FLUVOXAMINE MALEATE 25 MG PO TABS: 25.0000 mg | ORAL_TABLET | Freq: Every day | ORAL | 0 refills | Status: DC

## 2020-08-14 NOTE — Progress Notes (Addendum)
Courtney [name] [id number] 1951/04/17 70 y.o.  Subjective:   Patient ID:  Courtney [name] is a 70 y.o. (DOB 07-22-1950) female.  Chief Complaint:  Chief Complaint  Patient presents with  . Follow-up  . ADHD    HPI [name] presents to the office today for follow-up of first visit 03/01/2020 referred by a friend [name].  Was prescribed Viibryd and Vyvanse was changed to Concerta 54 mg to try to reduce the amount of dry mouth.  05/02/2020 appointment with the following noted: Less dry mouth with Concerta but didn't seem to last beyond 3-4 PM. Viibryd 20 mg daily.  Hard to get enough calories at breakfast.  But has been taking both in the morning.  Some benefit with Viibryd. Is there something I can take besides a stimulant, bc fears it is stressing her body.  Dx college exhausted adrenal glands.  It helps.  Asked questions about alternatives to stimulants.  Reduced Concerta to reduce anxiety and see if can achieve, but she thinks it's worse in the evening. Anxiety is still a struggle.  Life crisis moment.  Chronic marital dissatisfaction worse now that both are retired.  Feels like she's in a prison.  Family notices she's stressed.  Has done therapy since age 1 yo.  Has done Hindu meditation without help.  Christian faith disciplines.  Grew up caretaking.  I'm done and I need to change.  Goal is calm down enough to function through the situation. Tingling foot and wants B12 testing. Plan: Switch Viibryd to 20 mg in evening meal to get better absorption.  06/18/2020 appointment with following noted: No difference in anxiety or mood with Viibryd 20 mg daily.  She doesn't want to increase it. Questions about Viibryd and Genesight testing.  Asks about differences between Adderall and MPH.   Missed for 3 days Concerta and took Adderall.    Tired by 4-5 PM and doesn't like that.    Plan: She wants to wean off the Viibryd because she does not feel it has been helpful and  she does not want to increase the dosage.  07/03/2020 phone call patient stating she wanted to continue Viibryd 20 mg daily.  She also requested refill of Concerta 54 mg  1/95/0932 appointment with the following noted: Was told she couldn't take Viibryd with one of the pain meds and had TKR.  So stopped it.  Couldn't tolerate pain meds except tramadol.  Had more pain than expected.   Stopped Viibryd about 07/15/20.  She had taken tramadol with Viibryd in the past without a problem.   Noticing more pain after surgery in non-surgical places. Seems to be sensitive to getting lightheaded and confused with low blood sugar.   Poor attention and scattered since surgery.  Also more tired. Occ taken tramadol and otherwise just Tylenol. Impossible to tell the effect of the Viibryd given she hasn't been herself for months. Concerned about H's Geoff's memory who is also a patient here.  Wants him to get neuropsych testing.  Past Psychiatric Medication Trials: Trintellix NR, sertraline?,  Viibryd 20, remote zoloft, Paxil, Cymbalta couple days ? Adverse reaction Vyvanse, Adderall, concerta History Genesight  Review of Systems:  Review of Systems  Neurological: Negative for tremors and weakness.  Psychiatric/Behavioral: Positive for decreased concentration and dysphoric mood. The patient is nervous/anxious.     Medications: I have reviewed the patient's current medications.  Current Outpatient Medications  Medication Sig Dispense Refill  . ALPRAZolam (XANAX) 1 MG tablet Take 0.5-1 mg  by mouth daily as needed.    [name] amoxicillin (AMOXIL) 500 MG tablet Take 2,000 mg by mouth See admin instructions. Take 2000 mg by mouth 1 hour prior to dental appt    . Calcium Carb-Cholecalciferol (CALCIUM 600+D3 PO) Take 1,200 tablets by mouth daily.     . Cholecalciferol (VITAMIN D) 125 MCG (5000 UT) CAPS Take 5,000 Units by mouth daily.    [name] estradiol (ESTRACE) 1 MG tablet Take 1.5 tablets (1.5 mg total) by mouth daily.  45 tablet 3  . methocarbamol (ROBAXIN) 500 MG tablet Take 1 tablet (500 mg total) by mouth every 6 (six) hours as needed for muscle spasms. 40 tablet 0  . methylphenidate (CONCERTA) 54 MG PO CR tablet Take 1 tablet (54 mg total) by mouth every morning. 30 tablet 0  . mupirocin ointment (BACTROBAN) 2 % Use in both nares 3 times a day for 5 days 15 g 0  . nitroGLYCERIN (NITROSTAT) 0.4 MG SL tablet Place 1 tablet (0.4 mg total) under the tongue every 5 (five) minutes as needed for chest pain. 25 tablet 2  . NP THYROID 60 MG tablet Take 1 tablet (60 mg total) by mouth daily before breakfast. 30 tablet 3  . omega-3 acid ethyl esters (LOVAZA) 1 g capsule Take 2 capsules (2 g total) by mouth 2 (two) times daily. 120 capsule 11  . oxyCODONE (OXY IR/ROXICODONE) 5 MG immediate release tablet Take 1-2 tablets (5-10 mg total) by mouth every 6 (six) hours as needed for severe pain. 42 tablet 0  . pravastatin (PRAVACHOL) 20 MG tablet Take up to 1 tablet daily as tolerated. (Patient taking differently: Take 20 mg by mouth 2 (two) times a week.) 30 tablet 11  . pregabalin (LYRICA) 75 MG capsule Take 75 mg by mouth 2 (two) times daily.    . progesterone (PROMETRIUM) 200 MG capsule Take 2 capsules (400 mg total) by mouth daily. (Patient taking differently: Take 200 mg by mouth daily.) 60 capsule 3  . Testosterone 1.62 % GEL Place 5 mg onto the skin daily. 30 g 1  . traMADol (ULTRAM) 50 MG tablet TAKE 1/2 TO 1 TABLET(25 TO 50 MG) BY MOUTH BACK EVERY 8 HOURS AS NEEDED FOR SEVERE PAIN 90 tablet 0  . Vilazodone HCl (VIIBRYD) 20 MG TABS Take 1 tablet (20 mg total) by mouth daily at 12 noon. 30 tablet 1  . vitamin E 400 UNIT capsule Take 800 Units by mouth daily.      No current facility-administered medications for this visit.    Medication Side Effects: Other: dry mouth  Allergies:  Allergies  Allergen Reactions  . Clarithromycin Nausea Only  . Other Other (See Comments)  . Bactrim  [Sulfamethoxazole-Trimethoprim] Nausea And Vomiting  . Morphine Nausea And Vomiting    Other reaction(s): Nausea/Vomiting  . Morphine And Related Nausea And Vomiting  . Azithromycin Nausea And Vomiting and Nausea Only  . Ceclor [Cefaclor] Nausea And Vomiting    Can take Augmentin ok  . Codeine Nausea And Vomiting  . Cymbalta [Duloxetine Hcl]   . Erythromycin Nausea And Vomiting    Other reaction(s): Nausea/Vomiting  . Erythromycin Base Nausea And Vomiting  . Levofloxacin Nausea Only    REACTION: nausea  . Loratadine     REACTION: bruises  . Nitrofurantoin Nausea Only  . Propoxyphene Nausea Only    dizzy  . Propoxyphene N-Acetaminophen Nausea Only    dizzy    Past Medical History:  Diagnosis Date  . ADD (attention deficit  disorder)   . Anxiety   . Celiac disease    possible vs IBS  . Complication of anesthesia   . Coronary artery disease   . Depression    no bipolar per Dr. [name]  . GI problem    [name]  . Gluten intolerance   . Hyperlipidemia   . IBS (irritable bowel syndrome)   . Osteoarthritis   . Osteoporosis   . PONV (postoperative nausea and vomiting)   . Rheumatoid arthritis ([place])   . Seasonal allergies 01-08-12   hx. of multiple bronchitis related to this.  . Shoulder pain, right     Family History  Problem Relation Age of Onset  . Heart disease Mother   . Heart attack Mother   . Prostate cancer Father   . Coronary artery disease Other        FH Female 1st degree relative <60  . ADD / ADHD Other     Social History   Socioeconomic History  . Marital status: Married    Spouse name: Not on file  . Number of children: Not on file  . Years of education: Not on file  . Highest education level: Not on file  Occupational History  . Occupation: Futures trader  Tobacco Use  . Smoking status: Never Smoker  . Smokeless tobacco: Never Used  . Tobacco comment: only social  Vaping Use  . Vaping Use: Never used  Substance and Sexual Activity  .  Alcohol use: Yes    Alcohol/week: 0.0 standard drinks    Comment: rare social   . Drug use: No  . Sexual activity: Yes  Other Topics Concern  . 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   Social Determinants of Health   Financial Resource Strain: Not on file  Food Insecurity: Not on file  Transportation Needs: Not on file  Physical Activity: Not on file  Stress: Not on file  Social Connections: Not on file  Intimate Partner Violence: Not on file    Past Medical History, Surgical history, Social history, and Family history were reviewed and updated as appropriate.   Please see review of systems for further details on the patient's review from today.   Objective:   Physical Exam:  LMP 01/08/1999   Physical Exam Constitutional:      General: She is not in acute distress. Musculoskeletal:        General: No deformity.  Neurological:     Mental Status: She is alert and oriented to person, place, and time.     Coordination: Coordination normal.  Psychiatric:        Attention and Perception: Attention and perception normal. She does not perceive auditory or visual hallucinations.        Mood and Affect: Mood is anxious. Mood is not depressed. Affect is not labile, blunt, angry or inappropriate.        Speech: Speech normal.        Behavior: Behavior is not hyperactive.        Thought Content: Thought content normal. Thought content is not paranoid or delusional. Thought content does not include homicidal or suicidal ideation. Thought content does not include homicidal or suicidal plan.        Cognition and Memory: Cognition and memory normal.        Judgment: Judgment normal.     Comments: Insight intact Hyperactive style     Lab  Review:     Component Value Date/Time   NA 134 (L) 07/17/2020 0241   NA 134 06/14/2018 1444   K 4.4 07/17/2020 0241   CL 101 07/17/2020  0241   CO2 26 07/17/2020 0241   GLUCOSE 161 (H) 07/17/2020 0241   BUN 19 07/17/2020 0241   BUN 10 06/14/2018 1444   CREATININE 0.80 07/17/2020 0241   CREATININE 0.76 04/10/2020 1431   CALCIUM 8.6 (L) 07/17/2020 0241   PROT 6.8 07/06/2020 1048   PROT 6.6 10/18/2018 1022   ALBUMIN 3.9 07/06/2020 1048   ALBUMIN 4.3 10/18/2018 1022   AST 21 07/06/2020 1048   ALT 13 07/06/2020 1048   ALKPHOS 49 07/06/2020 1048   BILITOT 0.5 07/06/2020 1048   BILITOT 0.4 10/18/2018 1022   GFRNONAA >60 07/17/2020 0241   GFRNONAA 79 04/10/2020 1431   GFRAA 91 04/10/2020 1431       Component Value Date/Time   WBC 6.8 07/17/2020 0241   RBC 3.09 (L) 07/17/2020 0241   HGB 9.7 (L) 07/17/2020 0241   HCT 29.9 (L) 07/17/2020 0241   PLT 268 07/17/2020 0241   MCV 96.8 07/17/2020 0241   MCH 31.4 07/17/2020 0241   MCHC 32.4 07/17/2020 0241   RDW 12.0 07/17/2020 0241   LYMPHSABS 2.0 06/14/2018 0005   MONOABS 0.7 06/14/2018 0005   EOSABS 0.2 06/14/2018 0005   BASOSABS 0.0 06/14/2018 0005    No results found for: POCLITH, LITHIUM   No results found for: PHENYTOIN, PHENOBARB, VALPROATE, CBMZ   06/04/20 B12 normal at 496  .res Assessment: Plan:    Fusae was seen today for follow-up and adhd.  Diagnoses and all orders for this visit:  Generalized anxiety disorder  Attention deficit hyperactivity disorder (ADHD), combined type   Disc Genesight testing and her desire to take something for anxiety that is in the "Use as Directed" column.  Best remaining option would be fluvoxamine but will use very low dose. Luvox 25 mg HS   B12 level checked 496 and normal.  She doesn't want to reduce Concerta.  She wants to continue 54 mg daily. . [facility]   Supportive therapy around marital crisis.  Reviewed Genesight testing more extensively today in detail for 30 mins.  She has a copy.  FU 8 weeks  Lynder Parents, MD, DFAPA   Please see After Visit  Summary for patient specific instructions.  Future Appointments  Date Time Provider [place]  08/27/2020  3:00 PM Doree Albee, MD [place] None  09/04/2020  1:20 PM Belva Crome, MD [facility]  05/01/2021  7:45 AM [facility]  05/01/2021  9:00 AM [facility]    No orders of the defined types were placed in this encounter.   -------------------------------

## 2020-08-15 DIAGNOSIS — R269 Unspecified abnormalities of gait and mobility: Secondary | ICD-10-CM | POA: Diagnosis not present

## 2020-08-15 DIAGNOSIS — M25562 Pain in left knee: Secondary | ICD-10-CM | POA: Diagnosis not present

## 2020-08-16 ENCOUNTER — Telehealth: Payer: Self-pay | Admitting: Interventional Cardiology

## 2020-08-16 NOTE — Telephone Encounter (Signed)
Spoke with pt and made her aware that she has had recent lab work over the last few months, so we would not need to repeat anything.

## 2020-08-16 NOTE — Telephone Encounter (Signed)
Patient called in to see if she needs to schedule lab work prior to her 5/10 appt please advise

## 2020-08-17 DIAGNOSIS — M25562 Pain in left knee: Secondary | ICD-10-CM | POA: Diagnosis not present

## 2020-08-17 DIAGNOSIS — R269 Unspecified abnormalities of gait and mobility: Secondary | ICD-10-CM | POA: Diagnosis not present

## 2020-08-21 DIAGNOSIS — Z96652 Presence of left artificial knee joint: Secondary | ICD-10-CM | POA: Diagnosis not present

## 2020-08-21 DIAGNOSIS — Z471 Aftercare following joint replacement surgery: Secondary | ICD-10-CM | POA: Diagnosis not present

## 2020-08-21 NOTE — Telephone Encounter (Signed)
Completed form place in MD folder to sign.Marland KitchenJohny [name]

## 2020-08-22 DIAGNOSIS — M25562 Pain in left knee: Secondary | ICD-10-CM | POA: Diagnosis not present

## 2020-08-22 DIAGNOSIS — R269 Unspecified abnormalities of gait and mobility: Secondary | ICD-10-CM | POA: Diagnosis not present

## 2020-08-24 DIAGNOSIS — R269 Unspecified abnormalities of gait and mobility: Secondary | ICD-10-CM | POA: Diagnosis not present

## 2020-08-24 DIAGNOSIS — M25562 Pain in left knee: Secondary | ICD-10-CM | POA: Diagnosis not present

## 2020-08-27 ENCOUNTER — Other Ambulatory Visit: Payer: Self-pay

## 2020-08-27 ENCOUNTER — Ambulatory Visit (INDEPENDENT_AMBULATORY_CARE_PROVIDER_SITE_OTHER): Payer: Medicare Other | Admitting: Internal Medicine

## 2020-08-27 ENCOUNTER — Encounter (INDEPENDENT_AMBULATORY_CARE_PROVIDER_SITE_OTHER): Payer: Self-pay | Admitting: Internal Medicine

## 2020-08-27 VITALS — BP 122/68 | HR 84 | Temp 97.7°F | Ht 59.0 in | Wt 115.0 lb

## 2020-08-27 DIAGNOSIS — F520 Hypoactive sexual desire disorder: Secondary | ICD-10-CM

## 2020-08-27 DIAGNOSIS — I2510 Atherosclerotic heart disease of native coronary artery without angina pectoris: Secondary | ICD-10-CM

## 2020-08-27 DIAGNOSIS — E785 Hyperlipidemia, unspecified: Secondary | ICD-10-CM | POA: Diagnosis not present

## 2020-08-27 DIAGNOSIS — R8281 Pyuria: Secondary | ICD-10-CM | POA: Diagnosis not present

## 2020-08-27 DIAGNOSIS — R5381 Other malaise: Secondary | ICD-10-CM | POA: Diagnosis not present

## 2020-08-27 DIAGNOSIS — R319 Hematuria, unspecified: Secondary | ICD-10-CM | POA: Diagnosis not present

## 2020-08-27 DIAGNOSIS — Z780 Asymptomatic menopausal state: Secondary | ICD-10-CM | POA: Diagnosis not present

## 2020-08-27 DIAGNOSIS — R5383 Other fatigue: Secondary | ICD-10-CM

## 2020-08-27 NOTE — Progress Notes (Signed)
Metrics: Intervention Frequency ACO  Documented Smoking Status Yearly  Screened one or more times in 24 months  Cessation Counseling or  Active cessation medication Past 24 months  Past 24 months   Guideline developer: UpToDate (See UpToDate for funding source) Date Released: 2014       Wellness Office Visit  Subjective:  Patient ID: Courtney [name], female    DOB: Dec 12, 1950  Age: 70 y.o. MRN: [id number]  CC: This lady comes in for follow-up of identical hormone therapy as well as thyroid therapy. HPI  Since the last time I saw her, which was before her knee replacement, she started taking desiccated NP thyroid and also testosterone cream. She reduced her estradiol dose to 1 mg daily because of breast tenderness.  She still has some but this is reduced.  She continues on progesterone 200 mg at night. Past Medical History:  Diagnosis Date  . ADD (attention deficit disorder)   . Anxiety   . Celiac disease    possible vs IBS  . Complication of anesthesia   . Coronary artery disease   . Depression    no bipolar per Dr. [name]  . GI problem    [name]  . Gluten intolerance   . Hyperlipidemia   . IBS (irritable bowel syndrome)   . Osteoarthritis   . Osteoporosis   . PONV (postoperative nausea and vomiting)   . Rheumatoid arthritis ([place])   . Seasonal allergies 01-08-12   hx. of multiple bronchitis related to this.  . Shoulder pain, right    Past Surgical History:  Procedure Laterality Date  . EXPLORATORY LAPAROTOMY  01-08-12   due to endometriosis-portions of both ovaries removed  . TONSILLECTOMY    . TOTAL HIP ARTHROPLASTY     right. 2002  . TOTAL HIP ARTHROPLASTY  01/14/2012   Procedure: TOTAL HIP ARTHROPLASTY;  Surgeon: Gearlean Alf, MD;  Location: [facility];  Service: Orthopedics;  Laterality: Left;  . TOTAL KNEE ARTHROPLASTY Left 07/16/2020   Procedure: TOTAL KNEE ARTHROPLASTY;  Surgeon: Gaynelle Arabian, MD;  Location: [facility];  Service: Orthopedics;  Laterality:  Left;  65min  . TOTAL SHOULDER ARTHROPLASTY Right   . VENTRAL HERNIA REPAIR  01-08-12   abdominal     Family History  Problem Relation Age of Onset  . Heart disease Mother   . Heart attack Mother   . Prostate cancer Father   . Coronary artery disease Other        FH Female 1st degree relative <60  . 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   Social History   Tobacco Use  . Smoking status: Never Smoker  . Smokeless tobacco: Never Used  . Tobacco comment: only social  Substance Use Topics  . Alcohol use: Yes    Alcohol/week: 0.0 standard drinks    Comment: rare social     Current Meds  Medication Sig  . ALPRAZolam (XANAX) 1 MG tablet Take 0.5-1 mg by mouth daily as needed.  . Calcium Carb-Cholecalciferol (CALCIUM 600+D3 PO) Take 1,200 tablets by mouth daily.   . Cholecalciferol (VITAMIN D) 125 MCG (5000 UT) CAPS Take 5,000 Units by mouth daily.  [name] estradiol (ESTRACE) 1 MG tablet Take 1.5 tablets (1.5 mg total) by mouth daily. (Patient taking differently: Take 1 mg by mouth daily.)  . fluvoxaMINE (LUVOX) 25 MG tablet Take 1 tablet (25 mg total)  by mouth at bedtime.  . methocarbamol (ROBAXIN) 500 MG tablet Take 1 tablet (500 mg total) by mouth every 6 (six) hours as needed for muscle spasms.  . methylphenidate (CONCERTA) 54 MG PO CR tablet Take 1 tablet (54 mg total) by mouth every morning.  . mupirocin ointment (BACTROBAN) 2 % Use in both nares 3 times a day for 5 days  . NP THYROID 60 MG tablet Take 1 tablet (60 mg total) by mouth daily before breakfast.  . omega-3 acid ethyl esters (LOVAZA) 1 g capsule Take 2 capsules (2 g total) by mouth 2 (two) times daily.  . pravastatin (PRAVACHOL) 20 MG tablet Take up to 1 tablet daily as tolerated. (Patient taking differently: Take 20 mg by mouth 2 (two) times a week.)  . pregabalin (LYRICA) 75 MG  capsule Take 75 mg by mouth 2 (two) times daily.  . progesterone (PROMETRIUM) 200 MG capsule Take 2 capsules (400 mg total) by mouth daily. (Patient taking differently: Take 200 mg by mouth daily.)  . Testosterone 1.62 % GEL Place 5 mg onto the skin daily.  . traMADol (ULTRAM) 50 MG tablet TAKE 1/2 TO 1 TABLET(25 TO 50 MG) BY MOUTH BACK EVERY 8 HOURS AS NEEDED FOR SEVERE PAIN  . vitamin E 400 UNIT capsule Take 800 Units by mouth daily.   . [DISCONTINUED] amoxicillin (AMOXIL) 500 MG tablet Take 2,000 mg by mouth See admin instructions. Take 2000 mg by mouth 1 hour prior to dental appt     [place] Office Visit from 08/27/2020 in [facility]  PHQ-9 Total Score 0      Objective:   Today's Vitals: BP 122/68   Pulse 84   Temp 97.7 F (36.5 C) (Temporal)   Ht 4\' 11"  (1.499 m)   Wt 115 lb (52.2 kg)   LMP 01/08/1999   SpO2 97%   BMI 23.23 kg/m  Vitals with BMI 08/27/2020 07/17/2020 07/17/2020  Height 4\' 11"  - -  Weight 115 lbs - -  BMI 40.98 - -  Systolic 119 147 829  Diastolic 68 69 68  Pulse 84 59 63  Some encounter information is confidential and restricted. Go to Review Flowsheets activity to see all data.     Physical Exam  Her left knee scar from her knee replacement has healed well.     Assessment   1. Hypoactive sexual desire disorder   2. Dyslipidemia   3. Menopause   4. Malaise and fatigue       Tests ordered Orders Placed This Encounter  Procedures  . Estradiol  . Progesterone  . Testos,Total,Free and SHBG (Female)  . T3, free  . TSH     Plan: 1. She had lots of questions regarding studies around bioidentical hormones and I have told her that I will try and get together studies on each of these hormones but it will take some time. 2. She also asked about studies regarding statins and I will do the same. 3. In the meantime, she is still willing to continue the bioidentical hormones and I will check levels today.  I did tell her that if  she is not comfortable with the bioidentical hormones, she can certainly discontinue but for the time being she is willing to continue. 4. Follow-up in about 3 months and further recommendations will depend on blood results.   No orders of the defined types were placed in this encounter.   Doree Albee, MD

## 2020-08-28 ENCOUNTER — Encounter (INDEPENDENT_AMBULATORY_CARE_PROVIDER_SITE_OTHER): Payer: Self-pay | Admitting: Internal Medicine

## 2020-08-29 DIAGNOSIS — M25562 Pain in left knee: Secondary | ICD-10-CM | POA: Diagnosis not present

## 2020-08-29 DIAGNOSIS — R269 Unspecified abnormalities of gait and mobility: Secondary | ICD-10-CM | POA: Diagnosis not present

## 2020-08-30 LAB — TESTOS,TOTAL,FREE AND SHBG (FEMALE)
Free Testosterone: 5 pg/mL — ABNORMAL HIGH (ref 0.2–3.7)
Sex Hormone Binding: 215 nmol/L — ABNORMAL HIGH (ref 14–73)
Testosterone, Total, LC-MS-MS: 105 ng/dL — ABNORMAL HIGH (ref 2–45)

## 2020-08-30 LAB — PROGESTERONE: Progesterone: 2.1 ng/mL

## 2020-08-30 LAB — ESTRADIOL: Estradiol: 63 pg/mL

## 2020-08-30 LAB — TSH: TSH: 0.35 mIU/L — ABNORMAL LOW (ref 0.40–4.50)

## 2020-08-30 LAB — T3, FREE: T3, Free: 3.8 pg/mL (ref 2.3–4.2)

## 2020-08-31 DIAGNOSIS — R269 Unspecified abnormalities of gait and mobility: Secondary | ICD-10-CM | POA: Diagnosis not present

## 2020-08-31 DIAGNOSIS — M25562 Pain in left knee: Secondary | ICD-10-CM | POA: Diagnosis not present

## 2020-09-02 NOTE — Progress Notes (Signed)
Cardiology Office Note:    Date:  09/04/2020   ID:  Courtney [name], DOB December 18, 1950, MRN [id number]  PCP:  Cassandria Anger, MD  Cardiologist:  Sinclair Grooms, MD   Referring MD: Cassandria Anger, MD   Chief Complaint  Patient presents with  . Coronary Artery Disease    History of Present Illness:    Courtney [name] is a 70 y.o. female with a hx of CAD, family history of CAD and HLD.  Not having cardiac or neurological symptoms.  Denies claudication.  Past Medical History:  Diagnosis Date  . ADD (attention deficit disorder)   . Anxiety   . Celiac disease    possible vs IBS  . Complication of anesthesia   . Coronary artery disease   . Depression    no bipolar per Dr. [name]  . GI problem    [name]  . Gluten intolerance   . Hyperlipidemia   . IBS (irritable bowel syndrome)   . Osteoarthritis   . Osteoporosis   . PONV (postoperative nausea and vomiting)   . Rheumatoid arthritis ([place])   . Seasonal allergies 01-08-12   hx. of multiple bronchitis related to this.  . Shoulder pain, right     Past Surgical History:  Procedure Laterality Date  . EXPLORATORY LAPAROTOMY  01-08-12   due to endometriosis-portions of both ovaries removed  . TONSILLECTOMY    . TOTAL HIP ARTHROPLASTY     right. 2002  . TOTAL HIP ARTHROPLASTY  01/14/2012   Procedure: TOTAL HIP ARTHROPLASTY;  Surgeon: Gearlean Alf, MD;  Location: [facility];  Service: Orthopedics;  Laterality: Left;  . TOTAL KNEE ARTHROPLASTY Left 07/16/2020   Procedure: TOTAL KNEE ARTHROPLASTY;  Surgeon: Gaynelle Arabian, MD;  Location: [facility];  Service: Orthopedics;  Laterality: Left;  37min  . TOTAL SHOULDER ARTHROPLASTY Right   . VENTRAL HERNIA REPAIR  01-08-12   abdominal    Current Medications: Current Meds  Medication Sig  . ALPRAZolam (XANAX) 1 MG tablet Take 0.5-1 mg by mouth daily as needed.  . Calcium Carb-Cholecalciferol (CALCIUM 600+D3 PO) Take 1,200 tablets by mouth daily.   .  Cholecalciferol (VITAMIN D) 125 MCG (5000 UT) CAPS Take 5,000 Units by mouth daily.  [name] estradiol (ESTRACE) 1 MG tablet Take 1.5 tablets (1.5 mg total) by mouth daily. (Patient taking differently: Take 1 mg by mouth daily.)  . fluvoxaMINE (LUVOX) 25 MG tablet Take 1 tablet (25 mg total) by mouth at bedtime.  . methocarbamol (ROBAXIN) 500 MG tablet Take 1 tablet (500 mg total) by mouth every 6 (six) hours as needed for muscle spasms.  . methylphenidate (CONCERTA) 54 MG PO CR tablet Take 1 tablet (54 mg total) by mouth every morning.  . NP THYROID 60 MG tablet Take 1 tablet (60 mg total) by mouth daily before breakfast.  . omega-3 acid ethyl esters (LOVAZA) 1 g capsule Take 2 capsules (2 g total) by mouth 2 (two) times daily.  . pregabalin (LYRICA) 75 MG capsule Take 75 mg by mouth 2 (two) times daily.  . progesterone (PROMETRIUM) 200 MG capsule Take 2 capsules (400 mg total) by mouth at bedtime.  . Testosterone 1.62 % GEL Place 5 mg onto the skin daily.  . traMADol (ULTRAM) 50 MG tablet TAKE 1/2 TO 1 TABLET(25 TO 50 MG) BY MOUTH BACK EVERY 8 HOURS AS NEEDED FOR SEVERE PAIN  . vitamin E 400 UNIT capsule Take 800 Units by mouth daily.   . [DISCONTINUED] mupirocin ointment (BACTROBAN)  2 % Use in both nares 3 times a day for 5 days  . [DISCONTINUED] pravastatin (PRAVACHOL) 20 MG tablet Take up to 1 tablet daily as tolerated. (Patient taking differently: Take 20 mg by mouth 2 (two) times a week.)     Allergies:   Clarithromycin, Other, Bactrim [sulfamethoxazole-trimethoprim], Morphine, Morphine and related, Azithromycin, Ceclor [cefaclor], Codeine, Cymbalta [duloxetine hcl], Erythromycin, Erythromycin base, Levofloxacin, Loratadine, Nitrofurantoin, Oxycodone, Propoxyphene, and Propoxyphene n-acetaminophen   Social History   Socioeconomic History  . Marital status: Married    Spouse name: Not on file  . Number of children: Not on file  . Years of education: Not on file  . Highest education level:  Not on file  Occupational History  . Occupation: Futures trader  Tobacco Use  . Smoking status: Never Smoker  . Smokeless tobacco: Never Used  . Tobacco comment: only social  Vaping Use  . Vaping Use: Never used  Substance and Sexual Activity  . Alcohol use: Yes    Alcohol/week: 0.0 standard drinks    Comment: rare social   . Drug use: No  . Sexual activity: Yes  Other Topics Concern  . 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   Social Determinants of Health   Financial Resource Strain: Not on file  Food Insecurity: Not on file  Transportation Needs: Not on file  Physical Activity: Not on file  Stress: Not on file  Social Connections: Not on file     Family History: The patient's family history includes ADD / ADHD in an other family member; Coronary artery disease in an other family member; Heart attack in her mother; Heart disease in her mother; Prostate cancer in her father.  ROS:   Please see the history of present illness.    Major complaint is back or knee shoulder discomfort related to osteoarthritis and scoliosis.  Recently had left knee replacement.  Fell down stairs yesterday.  All other systems reviewed and are negative.  EKGs/Labs/Other Studies Reviewed:    The following studies were reviewed today:  Coronary calcium score 269 and 2017  Nuclear stress test that was low risk and February 2020  Vascular Doppler study 2021: Summary:  Right: Resting right ankle-brachial index is within normal range. No  evidence of significant right lower extremity arterial disease. The right  toe-brachial index is normal.   Left: Resting left ankle-brachial index is within normal range. No  evidence of significant left lower extremity arterial disease. The left  toe-brachial index is normal.   EKG:  EKG not repeated  Recent Labs: 07/06/2020: ALT 13 07/17/2020:  BUN 19; Creatinine, Ser 0.80; Hemoglobin 9.7; Platelets 268; Potassium 4.4; Sodium 134 08/27/2020: TSH 0.35  Recent Lipid Panel    Component Value Date/Time   CHOL 223 (H) 06/11/2020 1034   CHOL 259 (H) 06/13/2015 1700   TRIG 61 06/11/2020 1034   TRIG 100 06/13/2015 1700   HDL 119 06/11/2020 1034   HDL 103 06/13/2015 1700   CHOLHDL 1.9 06/11/2020 1034   CHOLHDL 2.8 04/10/2020 1431   VLDL 12.8 09/16/2019 1200   LDLCALC 93 06/11/2020 1034   LDLCALC 118 (H) 04/10/2020 1431   LDLCALC 136 (H) 06/13/2015 1700   LDLDIRECT 120.7 08/04/2012 1607    Physical Exam:    VS:  BP 138/72   Pulse 83   Ht 4\' 11"  (1.499 m)   Wt 116 lb (52.6 kg)   LMP 01/08/1999  SpO2 99%   BMI 23.43 kg/m     Wt Readings from Last 3 Encounters:  09/04/20 116 lb (52.6 kg)  08/27/20 115 lb (52.2 kg)  07/16/20 112 lb (50.8 kg)     GEN: Compatible with age. No acute distress HEENT: Normal NECK: No JVD. LYMPHATICS: No lymphadenopathy CARDIAC: No murmur. RRR no gallop, or edema. VASCULAR:  Normal Pulses. No bruits. RESPIRATORY:  Clear to auscultation without rales, wheezing or rhonchi  ABDOMEN: Soft, non-tender, non-distended, No pulsatile mass, MUSCULOSKELETAL: No deformity  SKIN: Warm and dry NEUROLOGIC:  Alert and oriented x 3 PSYCHIATRIC:  Normal affect   ASSESSMENT:    1. CAD in native artery   2. Bilateral carotid bruits   3. Intermittent claudication ([place])   4. Mixed hyperlipidemia   5. TOBACCO USE, QUIT    PLAN:    In order of problems listed above:  1. Primary prevention discussed 2. Preventive measures discussed 3. No evidence of PAD.  Lower extremity pains related to musculoskeletal problems. 4. High HDL, greater than 100.  LDL 93 in February 5. Not using tobacco  Overall education and awareness concerning primary risk prevention was discussed in detail: LDL less than 70, hemoglobin A1c less than 7, blood pressure target less than 130/80 mmHg, >150 minutes of moderate aerobic  activity per week, avoidance of smoking, weight control (via diet and exercise), and continued surveillance/management of/for obstructive sleep apnea.  Extended visit related to multiple and somewhat redundant questioning.  Agreed today to start pravastatin 20 mg Monday, Wednesday, and Friday.  She was scheduled to be on a daily but never started therapy.  She has coronary calcium by CT.  She wonders how often this should be checked.  The recommendation is in 5 years after the initial study.  In absence of compliance with medical therapy, there is no reason to check it repeatedly.  We discussed risk prevention as noted above.  Key among these is going to be aerobic activity.  She much rather manage lipids without pharmacology if possible.  Extended office visit with greater than 50% of time spent in counseling.  Medication Adjustments/Labs and Tests Ordered: Current medicines are reviewed at length with the patient today.  Concerns regarding medicines are outlined above.  Orders Placed This Encounter  Procedures  . Lipid Profile  . Hepatic function panel   Meds ordered this encounter  Medications  . pravastatin (PRAVACHOL) 20 MG tablet    Sig: Take 1 tablet (20 mg total) by mouth every Monday, Wednesday, and Friday.    Dispense:  45 tablet    Refill:  3    Dose change    Patient Instructions  Medication Instructions:  Take Pravastatin 20 mg on Monday, Wednesday and Friday. *If you need a refill on your cardiac medications before your next appointment, please call your pharmacy*   Lab Work: Lipid and Liver in 6 weeks.  Labs require you to come fasting ( nothing to eat or drink except black coffee and water after midnight. If you have labs (blood work) drawn today and your tests are completely normal, you will receive your results only by: [name] MyChart Message (if you have MyChart) OR . A paper copy in the mail If you have any lab test that is abnormal or we need to change your  treatment, we will call you to review the results.   Testing/Procedures: None   Follow-Up: At [facility], you and your health needs are our priority.  As part of our  continuing mission to provide you with exceptional heart care, we have created designated Provider Care Teams.  These Care Teams include your primary Cardiologist (physician) and Advanced Practice Providers (APPs -  Physician Assistants and Nurse Practitioners) who all work together to provide you with the care you need, when you need it.  We recommend signing up for the patient portal called "MyChart".  Sign up information is provided on this After Visit Summary.  MyChart is used to connect with patients for Virtual Visits (Telemedicine).  Patients are able to view lab/test results, encounter notes, upcoming appointments, etc.  Non-urgent messages can be sent to your provider as well.   To learn more about what you can do with MyChart, go to [email or web address].    Your next appointment:   1 year(s)  The format for your next appointment:   In Person  Provider:   You may see Sinclair Grooms, MD or one of the following Advanced Practice Providers on your designated Care Team:    [name], NP    Other Instructions      Signed, Sinclair Grooms, MD  09/04/2020 2:56 PM    [place]

## 2020-09-03 ENCOUNTER — Other Ambulatory Visit (INDEPENDENT_AMBULATORY_CARE_PROVIDER_SITE_OTHER): Payer: Self-pay | Admitting: Internal Medicine

## 2020-09-03 DIAGNOSIS — M25562 Pain in left knee: Secondary | ICD-10-CM | POA: Diagnosis not present

## 2020-09-03 DIAGNOSIS — R269 Unspecified abnormalities of gait and mobility: Secondary | ICD-10-CM | POA: Diagnosis not present

## 2020-09-03 MED ORDER — PROGESTERONE 200 MG PO CAPS: 400.0000 mg | ORAL_CAPSULE | Freq: Every evening | ORAL | 3 refills | Status: DC

## 2020-09-04 ENCOUNTER — Other Ambulatory Visit: Payer: Self-pay

## 2020-09-04 ENCOUNTER — Encounter: Payer: Self-pay | Admitting: Interventional Cardiology

## 2020-09-04 ENCOUNTER — Ambulatory Visit (INDEPENDENT_AMBULATORY_CARE_PROVIDER_SITE_OTHER): Payer: Medicare Other | Admitting: Interventional Cardiology

## 2020-09-04 VITALS — BP 138/72 | HR 83 | Ht 59.0 in | Wt 116.0 lb

## 2020-09-04 DIAGNOSIS — I739 Peripheral vascular disease, unspecified: Secondary | ICD-10-CM

## 2020-09-04 DIAGNOSIS — R0989 Other specified symptoms and signs involving the circulatory and respiratory systems: Secondary | ICD-10-CM

## 2020-09-04 DIAGNOSIS — E782 Mixed hyperlipidemia: Secondary | ICD-10-CM

## 2020-09-04 DIAGNOSIS — I2510 Atherosclerotic heart disease of native coronary artery without angina pectoris: Secondary | ICD-10-CM | POA: Diagnosis not present

## 2020-09-04 DIAGNOSIS — Z87891 Personal history of nicotine dependence: Secondary | ICD-10-CM

## 2020-09-04 MED ORDER — PRAVASTATIN SODIUM 20 MG PO TABS: 20.0000 mg | ORAL_TABLET | ORAL | 3 refills | Status: DC

## 2020-09-04 NOTE — Patient Instructions (Signed)
Medication Instructions:  Take Pravastatin 20 mg on Monday, Wednesday and Friday. *If you need a refill on your cardiac medications before your next appointment, please call your pharmacy*   Lab Work: Lipid and Liver in 6 weeks.  Labs require you to come fasting ( nothing to eat or drink except black coffee and water after midnight. If you have labs (blood work) drawn today and your tests are completely normal, you will receive your results only by: [name] MyChart Message (if you have MyChart) OR . A paper copy in the mail If you have any lab test that is abnormal or we need to change your treatment, we will call you to review the results.   Testing/Procedures: None   Follow-Up: At [facility], you and your health needs are our priority.  As part of our continuing mission to provide you with exceptional heart care, we have created designated Provider Care Teams.  These Care Teams include your primary Cardiologist (physician) and Advanced Practice Providers (APPs -  Physician Assistants and Nurse Practitioners) who all work together to provide you with the care you need, when you need it.  We recommend signing up for the patient portal called "MyChart".  Sign up information is provided on this After Visit Summary.  MyChart is used to connect with patients for Virtual Visits (Telemedicine).  Patients are able to view lab/test results, encounter notes, upcoming appointments, etc.  Non-urgent messages can be sent to your provider as well.   To learn more about what you can do with MyChart, go to [email or web address].    Your next appointment:   1 year(s)  The format for your next appointment:   In Person  Provider:   You may see Sinclair Grooms, MD or one of the following Advanced Practice Providers on your designated Care Team:    [name], NP    Other Instructions

## 2020-09-06 ENCOUNTER — Telehealth: Payer: Self-pay | Admitting: Psychiatry

## 2020-09-06 NOTE — Telephone Encounter (Signed)
Further clarifying that medicine is not working and her anxiety is climbing. Esp bad in last 48 hrs.

## 2020-09-06 NOTE — Telephone Encounter (Signed)
Rtc call to pt.she gave me the same info as above.She did try the Luvox 25 mg HS and its not helping her anxiety.She said she is agitated impatient and anxious.She thinks she needs a increase.She also asked how long should she try a medication before she gives her feedback?

## 2020-09-06 NOTE — Telephone Encounter (Signed)
Started fluvoxamine 25 mg about 3 weeks ago.  That is not long enough to have full effect but it is long enough to perhaps see some benefit and she is not seeing any benefit.  However she is also complaining of tiredness.  There is a little bit of risk of tiredness from fluvoxamine.  There is a lot of risk of tiredness from Lyrica which she is planning to stop.  She should make one medicine change at a time and wait a week before making any other medicine change.  The fluvoxamine dose is very low but we want to be very careful with the dose because she is med sensitive.  Have her increase the fluvoxamine to 1-1/2 of the 25 mg tablets at night.  She then needs to wait about 3 to 4 weeks to see benefit.  If she feels more tired after increasing the dose though let us know.

## 2020-09-06 NOTE — Telephone Encounter (Signed)
Pt left a message that she is not comfortable taking the new medication that she started on. Please call to discuss options.

## 2020-09-06 NOTE — Telephone Encounter (Signed)
She is also willing to communicate via [place]. Also mentioned that she is on hormones, progesterone, from Dr. Anastasio Champion. She seems to be inordinantly tired.  Is also on Lyrica for nerve pain so she's gonna try stopping that to see if that is causing tiredness.

## 2020-09-07 NOTE — Telephone Encounter (Signed)
Reviewed

## 2020-09-07 NOTE — Telephone Encounter (Signed)
Pt informed

## 2020-09-12 ENCOUNTER — Other Ambulatory Visit: Payer: Self-pay | Admitting: Psychiatry

## 2020-09-12 ENCOUNTER — Telehealth (INDEPENDENT_AMBULATORY_CARE_PROVIDER_SITE_OTHER): Payer: Self-pay

## 2020-09-12 DIAGNOSIS — F411 Generalized anxiety disorder: Secondary | ICD-10-CM

## 2020-09-12 MED ORDER — NP THYROID 60 MG PO TABS: 60.0000 mg | ORAL_TABLET | Freq: Every day | ORAL | 3 refills | Status: DC

## 2020-09-12 NOTE — Telephone Encounter (Signed)
   Seeking advice  Patient tested positive for COVID 5/17 She is concerned about the severe headache

## 2020-09-12 NOTE — Telephone Encounter (Signed)
Medication refill

## 2020-09-12 NOTE — Telephone Encounter (Signed)
Patient calling back, requesting someone to call her when Dr. Alain Marion responds

## 2020-09-13 ENCOUNTER — Telehealth (INDEPENDENT_AMBULATORY_CARE_PROVIDER_SITE_OTHER): Payer: Medicare Other | Admitting: Nurse Practitioner

## 2020-09-13 ENCOUNTER — Other Ambulatory Visit: Payer: Self-pay | Admitting: Nurse Practitioner

## 2020-09-13 ENCOUNTER — Encounter: Payer: Self-pay | Admitting: Nurse Practitioner

## 2020-09-13 VITALS — Ht 59.0 in

## 2020-09-13 DIAGNOSIS — Z20822 Contact with and (suspected) exposure to covid-19: Secondary | ICD-10-CM | POA: Diagnosis not present

## 2020-09-13 DIAGNOSIS — R059 Cough, unspecified: Secondary | ICD-10-CM

## 2020-09-13 DIAGNOSIS — U071 COVID-19: Secondary | ICD-10-CM | POA: Diagnosis not present

## 2020-09-13 MED ORDER — ALBUTEROL SULFATE HFA 108 (90 BASE) MCG/ACT IN AERS: 1.0000 | INHALATION_SPRAY | Freq: Four times a day (QID) | RESPIRATORY_TRACT | 0 refills | Status: DC | PRN

## 2020-09-13 NOTE — Progress Notes (Signed)
Virtual Visit via Video Note  I connected with@ on 09/17/20 at  8:45 AM EDT by a video enabled telemedicine application and verified that I am speaking with the correct person using two identifiers.  Location: Patient:Home Provider: Office Participants: patient and provider  I discussed the limitations of evaluation and management by telemedicine and the availability of in person appointments. I also discussed with the patient that there may be a patient responsible charge related to this service. The patient expressed understanding and agreed to proceed.  ZO:XWRUE positive testing yesterday, nausea headache  History of Present Illness: URI  This is a new problem. The current episode started 1 to 4 weeks ago. The problem has been gradually improving. There has been no fever. Associated symptoms include congestion, coughing, headaches, nausea, rhinorrhea and sinus pain. Pertinent negatives include no abdominal pain, chest pain, diarrhea, dysuria, ear pain, joint pain, joint swelling, neck pain, plugged ear sensation, rash, sneezing, sore throat, swollen glands, vomiting or wheezing. She has tried acetaminophen and increased fluids for the symptoms. The treatment provided mild relief.  onset of symptoms 7days ago after attending a luncheon with friends. Denies any chest pain or SOB. Headache improves with tylenol or ibuprofen Cough controlled with mucinex DM. Has zofran and promethazine rx at home We discussed pros and cons of remdisivir vs MAB. She decided  To discuss remdisivir with her husband and get back to me if she decide to move forward.  Observations/Objective: Physical Exam Vitals reviewed.  Constitutional:      General: She is not in acute distress. Pulmonary:     Effort: Pulmonary effort is normal.  Neurological:     Mental Status: She is alert and oriented to person, place, and time.  Psychiatric:        Mood and Affect: Mood normal.        Behavior: Behavior normal.         Thought Content: Thought content normal.    Assessment and Plan: Courtney [name] was seen today for covid positive.  Diagnoses and all orders for this visit:  COVID-19 -     albuterol (VENTOLIN HFA) 108 (90 Base) MCG/ACT inhaler; Inhale 1-2 puffs into the lungs every 6 (six) hours as needed for wheezing or shortness of breath.  Cough   Follow Up Instructions: Maintain adequate oral hydration Use zofran of promethazine for nausea. Use robitussin or mucinex for cough Call the office if you change your mind about Remdisivir infusion.   I discussed the assessment and treatment plan with the patient. The patient was provided an opportunity to ask questions and all were answered. The patient agreed with the plan and demonstrated an understanding of the instructions.   The patient was advised to call back or seek an in-person evaluation if the symptoms worsen or if the condition fails to improve as anticipated.  Wilfred Lacy, NP

## 2020-09-17 ENCOUNTER — Encounter: Payer: Self-pay | Admitting: Nurse Practitioner

## 2020-09-17 ENCOUNTER — Other Ambulatory Visit (INDEPENDENT_AMBULATORY_CARE_PROVIDER_SITE_OTHER): Payer: Self-pay

## 2020-09-17 NOTE — Patient Instructions (Signed)

## 2020-09-18 MED ORDER — NP THYROID 60 MG PO TABS: 60.0000 mg | ORAL_TABLET | Freq: Every day | ORAL | 3 refills | Status: DC

## 2020-09-21 ENCOUNTER — Telehealth: Payer: Self-pay | Admitting: Psychiatry

## 2020-09-21 NOTE — Telephone Encounter (Signed)
She can stop it abruptly.  The side effects will be gone by tomorrow or within 12 hours of the time she stops it.  However she will be sleepy and lethargic for a few days.  If she would prefer I can send in a lower dose of either 36 mg or 27 mg, the latter is half of her current dose and that would keep her from feeling as sluggish.  However let me know what she wants to do

## 2020-09-21 NOTE — Telephone Encounter (Signed)
She would like to stop it completely.

## 2020-09-21 NOTE — Telephone Encounter (Signed)
Pt called reporting concerta 54 mg generic is making her nauseous every day. Hard to eat. Very irritated and anxiety is high. Can't sleep. Please return call. Pt asking if she can just stop taking? Don't want to go long weekend before getting response. [phone].

## 2020-09-21 NOTE — Telephone Encounter (Signed)
That's fine as previously noted.

## 2020-09-21 NOTE — Telephone Encounter (Signed)
Please review

## 2020-09-24 DIAGNOSIS — R269 Unspecified abnormalities of gait and mobility: Secondary | ICD-10-CM | POA: Diagnosis not present

## 2020-09-24 DIAGNOSIS — M25562 Pain in left knee: Secondary | ICD-10-CM | POA: Diagnosis not present

## 2020-09-28 ENCOUNTER — Telehealth: Payer: Self-pay | Admitting: Psychiatry

## 2020-09-28 NOTE — Telephone Encounter (Signed)
Next visit is 10/15/20. Hollis called and states she doesn't want to lower the Methylphenidate or be on it at all. She would like to go on the Adderall. Pharmacy is:  Her phone number is [phone].  [facility] [place], [place] - [address] Phone:  [phone]  Fax:  [phone]

## 2020-09-28 NOTE — Telephone Encounter (Signed)
Please review

## 2020-10-02 ENCOUNTER — Telehealth: Payer: Self-pay | Admitting: Psychiatry

## 2020-10-02 ENCOUNTER — Other Ambulatory Visit: Payer: Self-pay | Admitting: Psychiatry

## 2020-10-02 ENCOUNTER — Telehealth (INDEPENDENT_AMBULATORY_CARE_PROVIDER_SITE_OTHER): Payer: Self-pay

## 2020-10-02 DIAGNOSIS — F902 Attention-deficit hyperactivity disorder, combined type: Secondary | ICD-10-CM

## 2020-10-02 MED ORDER — AMPHETAMINE-DEXTROAMPHETAMINE 10 MG PO TABS: 10.0000 mg | ORAL_TABLET | Freq: Two times a day (BID) | ORAL | 0 refills | Status: DC

## 2020-10-02 NOTE — Telephone Encounter (Signed)
She stated she wants to know what is the fluvoxamine she is taking suppose to help with?She stated its not helping at all with her anxiety and agitation.She said she is under a lot of stress in her personal life right now.

## 2020-10-02 NOTE — Telephone Encounter (Signed)
Pt informed

## 2020-10-02 NOTE — Telephone Encounter (Signed)
I do not like switching medicines over the phone.  Because I can see she took this in 2019 I agreed to send in an Adderall 10 mg prescription 1 twice a day.  I will not make any further stimulant prescription changes until her appointment in a couple of weeks.

## 2020-10-02 NOTE — Telephone Encounter (Signed)
Addendum to previous message to review. Luvia called and asked why no one is calling her back and asked when she will get a response? See message below on 09/28/20. She would like to get a response today please.  Next visit is 10/15/20. Omaira called and states she doesn't want to lower the Methylphenidate or be on it at all. She would like to go on the Adderall. Pharmacy is:  Her phone number is [phone].  [facility] [place], [place] - [address] Phone:  [phone]  Fax:  [phone]

## 2020-10-02 NOTE — Telephone Encounter (Signed)
It supposed to help with anxiety and agitation.  She went up to 1-1/2 tablets when she called on May 12.  She has an appointment on June 20 and I do not want to make any more medicine changes until I see her at that appointment.  We just sent in the Adderall.  So no further med changes until the appointment.  Specifically it takes fluvoxamine several weeks to be helpful

## 2020-10-02 NOTE — Telephone Encounter (Signed)
I sent you this message last week please review

## 2020-10-02 NOTE — Telephone Encounter (Signed)
Patient called and requested a refill of the following medication and please send to [facility] in [place]:  Testosterone 1.62 % GEL  Last filled 05/08/2020, 30 g with 1 refill  Last OV 08/27/2020  Next OV 12/12/2020

## 2020-10-03 ENCOUNTER — Other Ambulatory Visit (INDEPENDENT_AMBULATORY_CARE_PROVIDER_SITE_OTHER): Payer: Self-pay | Admitting: Internal Medicine

## 2020-10-03 MED ORDER — TESTOSTERONE 1.62 % TD GEL: 5.0000 mg | Freq: Every day | TRANSDERMAL | 3 refills | Status: DC

## 2020-10-03 NOTE — Telephone Encounter (Signed)
Please let the patient know that I have called in this refill now.

## 2020-10-03 NOTE — Telephone Encounter (Signed)
reviewed

## 2020-10-05 DIAGNOSIS — M0579 Rheumatoid arthritis with rheumatoid factor of multiple sites without organ or systems involvement: Secondary | ICD-10-CM | POA: Diagnosis not present

## 2020-10-05 DIAGNOSIS — M25562 Pain in left knee: Secondary | ICD-10-CM | POA: Diagnosis not present

## 2020-10-05 DIAGNOSIS — R269 Unspecified abnormalities of gait and mobility: Secondary | ICD-10-CM | POA: Diagnosis not present

## 2020-10-05 DIAGNOSIS — Z6822 Body mass index (BMI) 22.0-22.9, adult: Secondary | ICD-10-CM | POA: Diagnosis not present

## 2020-10-05 DIAGNOSIS — L97929 Non-pressure chronic ulcer of unspecified part of left lower leg with unspecified severity: Secondary | ICD-10-CM | POA: Diagnosis not present

## 2020-10-05 DIAGNOSIS — M2550 Pain in unspecified joint: Secondary | ICD-10-CM | POA: Diagnosis not present

## 2020-10-05 DIAGNOSIS — M150 Primary generalized (osteo)arthritis: Secondary | ICD-10-CM | POA: Diagnosis not present

## 2020-10-10 DIAGNOSIS — M25562 Pain in left knee: Secondary | ICD-10-CM | POA: Diagnosis not present

## 2020-10-10 DIAGNOSIS — R269 Unspecified abnormalities of gait and mobility: Secondary | ICD-10-CM | POA: Diagnosis not present

## 2020-10-15 ENCOUNTER — Ambulatory Visit (INDEPENDENT_AMBULATORY_CARE_PROVIDER_SITE_OTHER): Payer: Medicare Other | Admitting: Psychiatry

## 2020-10-15 ENCOUNTER — Other Ambulatory Visit: Payer: Self-pay

## 2020-10-15 ENCOUNTER — Encounter: Payer: Self-pay | Admitting: Psychiatry

## 2020-10-15 ENCOUNTER — Telehealth: Payer: Self-pay

## 2020-10-15 VITALS — BP 144/85 | HR 83

## 2020-10-15 DIAGNOSIS — I2510 Atherosclerotic heart disease of native coronary artery without angina pectoris: Secondary | ICD-10-CM | POA: Diagnosis not present

## 2020-10-15 DIAGNOSIS — F411 Generalized anxiety disorder: Secondary | ICD-10-CM | POA: Diagnosis not present

## 2020-10-15 DIAGNOSIS — F4323 Adjustment disorder with mixed anxiety and depressed mood: Secondary | ICD-10-CM | POA: Diagnosis not present

## 2020-10-15 DIAGNOSIS — F902 Attention-deficit hyperactivity disorder, combined type: Secondary | ICD-10-CM

## 2020-10-15 MED ORDER — FLUVOXAMINE MALEATE 50 MG PO TABS: 75.0000 mg | ORAL_TABLET | Freq: Every day | ORAL | 0 refills | Status: DC

## 2020-10-15 NOTE — Patient Instructions (Signed)
Increase fluvoxamine to 50 mg daily for 1 week, Then increase to 75 mg daily for a week, If tolerated then increase to to 100 mg or 2 of the 50 mg tablets

## 2020-10-15 NOTE — Telephone Encounter (Signed)
Prior Approval received for AMPHETAMINE-DEXTROAMPHETAMINE 10 MG TABS #60 with [employer]/FE[employer] effective 09/15/2020-10/15/2021   ID# [id number]

## 2020-10-15 NOTE — Progress Notes (Signed)
Courtney [name] [id number] 1950-10-02 70 y.o.  Subjective:   Patient ID:  Courtney [name] is a 70 y.o. (DOB 1950/05/26) female.  Chief Complaint:  Chief Complaint  Patient presents with   Follow-up   Anxiety   ADHD   Stress    marriage    HPI Courtney [name] presents to the office today for follow-up of first visit 03/01/2020 referred by a friend [name].  Was prescribed Viibryd and Vyvanse was changed to Concerta 54 mg to try to reduce the amount of dry mouth.  05/02/2020 appointment with the following noted: Less dry mouth with Concerta but didn't seem to last beyond 3-4 PM. Viibryd 20 mg daily.  Hard to get enough calories at breakfast.  But has been taking both in the morning.  Some benefit with Viibryd. Is there something I can take besides a stimulant, bc fears it is stressing her body.  Dx college exhausted adrenal glands.  It helps.  Asked questions about alternatives to stimulants.  Reduced Concerta to reduce anxiety and see if can achieve, but she thinks it's worse in the evening. Anxiety is still a struggle.  Life crisis moment.  Chronic marital dissatisfaction worse now that both are retired.  Feels like she's in a prison.  Family notices she's stressed.  Has done therapy since age 17 yo.  Has done Hindu meditation without help.  Christian faith disciplines.  Grew up caretaking.  I'm done and I need to change.  Goal is calm down enough to function through the situation. Tingling foot and wants B12 testing. Plan: Switch Viibryd to 20 mg in evening meal to get better absorption.  06/18/2020 appointment with following noted: No difference in anxiety or mood with Viibryd 20 mg daily.  She doesn't want to increase it. Questions about Viibryd and Genesight testing.  Asks about differences between Adderall and MPH.   Missed for 3 days Concerta and took Adderall.    Tired by 4-5 PM and doesn't like that.    Plan: She wants to wean off the Viibryd because she does not  feel it has been helpful and she does not want to increase the dosage.  07/03/2020 phone call patient stating she wanted to continue Viibryd 20 mg daily.  She also requested refill of Concerta 54 mg  2/77/8242 appointment with the following noted: Was told she couldn't take Viibryd with one of the pain meds and had TKR.  So stopped it.  Couldn't tolerate pain meds except tramadol.  Had more pain than expected.   Stopped Viibryd about 07/15/20.  She had taken tramadol with Viibryd in the past without a problem.   Noticing more pain after surgery in non-surgical places. Seems to be sensitive to getting lightheaded and confused with low blood sugar.   Poor attention and scattered since surgery.  Also more tired. Occ taken tramadol and otherwise just Tylenol. Impossible to tell the effect of the Viibryd given she hasn't been herself for months. Concerned about H's Courtney [name]'s memory who is also a patient here.  Wants him to get neuropsych testing. Plan because patient is med sensitive we will start very low-dose fluvoxamine 25 mg nightly Per her request continue Concerta 54 mg every morning  10/25/2020 appointment with the following noted: Several phone calls since being here.  The first led to increasing fluvoxamine to 1-1/2 of the 25 mg tablets due to lack of effect at 3 weeks. 09/21/2020 she called asking to stop Concerta which she did. She called again wanting to  start Adderall.  Because she had taken it in 2019 it was agreed that she could start Adderall 10 mg twice daily. Stress dealing with husband and whether to move or not.  May separate.  Anxiety is very high.  Hard to calm down around him. $ stress.  Explodes with anger at husband.  Doesn't think he can sell the house on his own. Never been this stressed and anxious and in this kind of circumstance before. Never got the Adderall DT need for PA. Only caffeine in the AM  Past Psychiatric Medication Trials: Trintellix NR, sertraline?,  Viibryd  20, remote zoloft, Paxil, Cymbalta couple days ? Adverse reaction Vyvanse, Adderall, concerta History Genesight  Review of Systems:  Review of Systems  Neurological:  Negative for tremors and weakness.  Psychiatric/Behavioral:  Positive for decreased concentration and dysphoric mood. Negative for confusion and hallucinations. The patient is nervous/anxious.    Medications: I have reviewed the patient's current medications.  Current Outpatient Medications  Medication Sig Dispense Refill   ALPRAZolam (XANAX) 1 MG tablet Take 0.5-1 mg by mouth daily as needed.     Calcium Carb-Cholecalciferol (CALCIUM 600+D3 PO) Take 1,200 tablets by mouth daily.      estradiol (ESTRACE) 1 MG tablet Take 1.5 tablets (1.5 mg total) by mouth daily. (Patient taking differently: Take 1 mg by mouth daily.) 45 tablet 3   fluvoxaMINE (LUVOX) 25 MG tablet TAKE 1 TABLET(25 MG) BY MOUTH AT BEDTIME (Patient taking differently: Take 37.5 mg by mouth at bedtime.) 30 tablet 0   meloxicam (MOBIC) 15 MG tablet Take 15 mg by mouth daily.     methocarbamol (ROBAXIN) 500 MG tablet Take 1 tablet (500 mg total) by mouth every 6 (six) hours as needed for muscle spasms. 40 tablet 0   NP THYROID 60 MG tablet Take 1 tablet (60 mg total) by mouth daily before breakfast. 30 tablet 3   omega-3 acid ethyl esters (LOVAZA) 1 g capsule Take 2 capsules (2 g total) by mouth 2 (two) times daily. 120 capsule 11   pravastatin (PRAVACHOL) 20 MG tablet Take 1 tablet (20 mg total) by mouth every Monday, Wednesday, and Friday. 45 tablet 3   pregabalin (LYRICA) 75 MG capsule Take 75 mg by mouth 2 (two) times daily.     progesterone (PROMETRIUM) 200 MG capsule Take 2 capsules (400 mg total) by mouth at bedtime. 60 capsule 3   Testosterone 1.62 % GEL Place 5 mg onto the skin daily. 30 g 3   traMADol (ULTRAM) 50 MG tablet TAKE 1/2 TO 1 TABLET(25 TO 50 MG) BY MOUTH BACK EVERY 8 HOURS AS NEEDED FOR SEVERE PAIN 90 tablet 0   vitamin E 400 UNIT capsule Take  800 Units by mouth daily.      albuterol (VENTOLIN HFA) 108 (90 Base) MCG/ACT inhaler Inhale 1-2 puffs into the lungs every 6 (six) hours as needed for wheezing or shortness of breath. (Patient not taking: Reported on 10/15/2020) 8 g 0   amphetamine-dextroamphetamine (ADDERALL) 10 MG tablet Take 1 tablet (10 mg total) by mouth 2 (two) times daily with a meal. (Patient not taking: Reported on 10/15/2020) 60 tablet 0   Cholecalciferol (VITAMIN D) 125 MCG (5000 UT) CAPS Take 5,000 Units by mouth daily. (Patient not taking: Reported on 10/15/2020)     nitroGLYCERIN (NITROSTAT) 0.4 MG SL tablet Place 1 tablet (0.4 mg total) under the tongue every 5 (five) minutes as needed for chest pain. 25 tablet 2   No current facility-administered medications  for this visit.    Medication Side Effects: Other: dry mouth  Allergies:  Allergies  Allergen Reactions   Clarithromycin Nausea Only   Other Other (See Comments)   Bactrim [Sulfamethoxazole-Trimethoprim] Nausea And Vomiting   Morphine Nausea And Vomiting    Other reaction(s): Nausea/Vomiting   Morphine And Related Nausea And Vomiting   Azithromycin Nausea And Vomiting and Nausea Only   Ceclor [Cefaclor] Nausea And Vomiting    Can take Augmentin ok   Codeine Nausea And Vomiting   Cymbalta [Duloxetine Hcl]    Erythromycin Nausea And Vomiting    Other reaction(s): Nausea/Vomiting   Erythromycin Base Nausea And Vomiting   Levofloxacin Nausea Only    REACTION: nausea   Loratadine     REACTION: bruises   Nitrofurantoin Nausea Only   Oxycodone Other (See Comments)   Propoxyphene Nausea Only    dizzy   Propoxyphene N-Acetaminophen Nausea Only    dizzy    Past Medical History:  Diagnosis Date   ADD (attention deficit disorder)    Anxiety    Celiac disease    possible vs IBS   Complication of anesthesia    Coronary artery disease    Depression    no bipolar per Dr. [name]   GI problem    Olevia Perches   Gluten intolerance    Hyperlipidemia     IBS (irritable bowel syndrome)    Osteoarthritis    Osteoporosis    PONV (postoperative nausea and vomiting)    Rheumatoid arthritis (HCC)    Seasonal allergies 01-08-12   hx. of multiple bronchitis related to this.   Shoulder pain, right     Family History  Problem Relation Age of Onset   Heart disease Mother    Heart attack Mother    Prostate cancer Father    Coronary artery disease Other        FH Female 1st degree relative <60   ADD / ADHD Other     Social History   Socioeconomic History   Marital status: Married    Spouse name: Not on file   Number of children: Not on file   Years of education: Not on file   Highest education level: Not on file  Occupational History   Occupation: Futures trader  Tobacco Use   Smoking status: Never   Smokeless tobacco: Never   Tobacco comments:    only Hospital doctor   Vaping Use: Never used  Substance and Sexual Activity   Alcohol use: Yes    Alcohol/week: 0.0 standard drinks    Comment: rare social    Drug use: No   Sexual activity: Yes  Other Topics Concern   Not on file  Social History Narrative   Married for last 42 years.Lives with husband.Retired Insurance account manager.Originally from [place].   Social Determinants of Health   Financial Resource Strain: Not on file  Food Insecurity: Not on file  Transportation Needs: Not on file  Physical Activity: Not on file  Stress: Not on file  Social Connections: Not on file  Intimate Partner Violence: Not on file    Past Medical History, Surgical history, Social history, and Family history were reviewed and updated as appropriate.   Please see review of systems for further details on the patient's review from today.   Objective:   Physical Exam:  BP (!) 144/85   Pulse 83   LMP 01/08/1999   Physical Exam Constitutional:      General: She is not in  acute distress. Musculoskeletal:        General: No deformity.  Neurological:     Mental Status: She  is alert and oriented to person, place, and time.     Coordination: Coordination normal.  Psychiatric:        Attention and Perception: Attention and perception normal. She does not perceive auditory or visual hallucinations.        Mood and Affect: Mood is anxious. Mood is not depressed. Affect is not labile, blunt, angry or inappropriate.        Speech: Speech normal.        Behavior: Behavior is not hyperactive.        Thought Content: Thought content normal. Thought content is not paranoid or delusional. Thought content does not include homicidal or suicidal ideation. Thought content does not include homicidal or suicidal plan.        Cognition and Memory: Cognition and memory normal.        Judgment: Judgment normal.     Comments: Insight intact Hyperactive style ongoing    Lab Review:     Component Value Date/Time   NA 134 (L) 07/17/2020 0241   NA 134 06/14/2018 1444   K 4.4 07/17/2020 0241   CL 101 07/17/2020 0241   CO2 26 07/17/2020 0241   GLUCOSE 161 (H) 07/17/2020 0241   BUN 19 07/17/2020 0241   BUN 10 06/14/2018 1444   CREATININE 0.80 07/17/2020 0241   CREATININE 0.76 04/10/2020 1431   CALCIUM 8.6 (L) 07/17/2020 0241   PROT 6.8 07/06/2020 1048   PROT 6.6 10/18/2018 1022   ALBUMIN 3.9 07/06/2020 1048   ALBUMIN 4.3 10/18/2018 1022   AST 21 07/06/2020 1048   ALT 13 07/06/2020 1048   ALKPHOS 49 07/06/2020 1048   BILITOT 0.5 07/06/2020 1048   BILITOT 0.4 10/18/2018 1022   GFRNONAA >60 07/17/2020 0241   GFRNONAA 79 04/10/2020 1431   GFRAA 91 04/10/2020 1431       Component Value Date/Time   WBC 6.8 07/17/2020 0241   RBC 3.09 (L) 07/17/2020 0241   HGB 9.7 (L) 07/17/2020 0241   HCT 29.9 (L) 07/17/2020 0241   PLT 268 07/17/2020 0241   MCV 96.8 07/17/2020 0241   MCH 31.4 07/17/2020 0241   MCHC 32.4 07/17/2020 0241   RDW 12.0 07/17/2020 0241   LYMPHSABS 2.0 06/14/2018 0005   MONOABS 0.7 06/14/2018 0005   EOSABS 0.2 06/14/2018 0005   BASOSABS 0.0 06/14/2018 0005     No results found for: POCLITH, LITHIUM   No results found for: PHENYTOIN, PHENOBARB, VALPROATE, CBMZ   06/04/20 B12 normal at 496  .res Assessment: Plan:    [place] was seen today for follow-up, anxiety, adhd and stress.  Diagnoses and all orders for this visit:  Generalized anxiety disorder  Attention deficit hyperactivity disorder (ADHD), combined type  Adjustment disorder with mixed anxiety and depressed mood  Disc Genesight testing and her desire to take something for anxiety that is in the "Use as Directed" column.  Tolerating but no benefit from Luvox. Rec increase Luvox from 37.5 mg daily to 50 mg HS for a week then increase to 75 mg daily and possibly higher.   B12 level checked 496 and normal.  Hold Adderall until the anxiety is under control.  . Dial[facility]ical Behavior Therapy at [facility]   Supportive therapy around marital crisis.  Started therapy with Godfrey Pick.  Reviewed Genesight testing more extensively today in detail for 30 mins.  She has a copy.  FU 8 weeks  Lynder Parents, MD, DFAPA   Please see After Visit Summary for patient specific instructions.  Future Appointments  Date Time Provider [place]  10/17/2020 10:45 AM [facility]  12/12/2020  2:15 PM Hurshel Party C, MD [place] None  05/01/2021  7:45 AM [facility]  05/01/2021  9:00 AM [facility]    No orders of the defined types were placed in this encounter.   -------------------------------

## 2020-10-17 ENCOUNTER — Other Ambulatory Visit: Payer: Self-pay

## 2020-10-17 ENCOUNTER — Other Ambulatory Visit: Payer: Medicare Other | Admitting: *Deleted

## 2020-10-17 DIAGNOSIS — E782 Mixed hyperlipidemia: Secondary | ICD-10-CM

## 2020-10-17 DIAGNOSIS — I2510 Atherosclerotic heart disease of native coronary artery without angina pectoris: Secondary | ICD-10-CM | POA: Diagnosis not present

## 2020-10-17 LAB — HEPATIC FUNCTION PANEL
ALT: 10 IU/L (ref 0–32)
AST: 15 IU/L (ref 0–40)
Albumin: 4.4 g/dL (ref 3.7–4.7)
Alkaline Phosphatase: 62 IU/L (ref 44–121)
Bilirubin Total: 0.3 mg/dL (ref 0.0–1.2)
Bilirubin, Direct: 0.12 mg/dL (ref 0.00–0.40)
Total Protein: 6.5 g/dL (ref 6.0–8.5)

## 2020-10-17 LAB — LIPID PANEL
Chol/HDL Ratio: 2 ratio (ref 0.0–4.4)
Cholesterol, Total: 193 mg/dL (ref 100–199)
HDL: 98 mg/dL (ref >39)
LDL Chol Calc (NIH): 84 mg/dL (ref 0–99)
Triglycerides: 60 mg/dL (ref 0–149)
VLDL Cholesterol Cal: 11 mg/dL (ref 5–40)

## 2020-10-18 DIAGNOSIS — M25562 Pain in left knee: Secondary | ICD-10-CM | POA: Diagnosis not present

## 2020-10-18 DIAGNOSIS — R269 Unspecified abnormalities of gait and mobility: Secondary | ICD-10-CM | POA: Diagnosis not present

## 2020-10-22 ENCOUNTER — Telehealth: Payer: Self-pay | Admitting: *Deleted

## 2020-10-22 DIAGNOSIS — E785 Hyperlipidemia, unspecified: Secondary | ICD-10-CM

## 2020-10-22 MED ORDER — PRAVASTATIN SODIUM 20 MG PO TABS: ORAL_TABLET | ORAL | 3 refills | Status: DC

## 2020-10-22 NOTE — Telephone Encounter (Signed)
Belva Crome, MD  Loren Racer, RN; Plotnikov, Evie Lacks, MD Let the patient know the lipids are better.  LDL is 84.  The target for her clinical subset is less than 70.  We could try to take pravastatin more frequently or add Zetia 10 mg/day.  If she is concerned about statin side effects I would recommend the Zetia option.  If she chooses to make changes Iwould repeat the lipid panel in 2 to 3 months.    Spoke with pt and reviewed results and recommendations.  Pt would like to increase Pravastatin first but only wants to go to 4 days a week.  Will plan torepeat labs on 8/24.  Pt will call sooner if any issues.

## 2020-11-08 ENCOUNTER — Other Ambulatory Visit (INDEPENDENT_AMBULATORY_CARE_PROVIDER_SITE_OTHER): Payer: Self-pay | Admitting: Internal Medicine

## 2020-11-12 ENCOUNTER — Telehealth: Payer: Self-pay | Admitting: Internal Medicine

## 2020-11-12 NOTE — Progress Notes (Signed)
  Chronic Care Management   Outreach Note  11/12/2020 Name: Courtney [name] MRN: [id number] DOB: 06-Dec-1950  Referred by: Cassandria Anger, MD Reason for referral : No chief complaint on file.   An unsuccessful telephone outreach was attempted today. The patient was referred to the pharmacist for assistance with care management and care coordination.   Follow Up Plan:   Noelle Penner Upstream Scheduler

## 2020-11-14 ENCOUNTER — Other Ambulatory Visit (INDEPENDENT_AMBULATORY_CARE_PROVIDER_SITE_OTHER): Payer: Self-pay | Admitting: Internal Medicine

## 2020-11-14 ENCOUNTER — Telehealth (INDEPENDENT_AMBULATORY_CARE_PROVIDER_SITE_OTHER): Payer: Self-pay

## 2020-11-14 MED ORDER — NP THYROID 60 MG PO TABS: 60.0000 mg | ORAL_TABLET | Freq: Every day | ORAL | 1 refills | Status: DC

## 2020-11-14 MED ORDER — ESTRADIOL 1 MG PO TABS: 1.5000 mg | ORAL_TABLET | Freq: Every day | ORAL | 1 refills | Status: DC

## 2020-11-14 MED ORDER — PROGESTERONE 200 MG PO CAPS: 400.0000 mg | ORAL_CAPSULE | Freq: Every evening | ORAL | 1 refills | Status: DC

## 2020-11-14 NOTE — Telephone Encounter (Signed)
Patient called and stated that she is taking the new dosage of Progesterone and it makes her really groggy and she is taking it earlier and took with wine and it made her feel drunk and she wanted to know if OK to take with wine? Also she has had low progesterone when she was trying to have her son in the 77's and wanted to know if she needs more tests done?  Also patient is asking for 90 day supply on all of her medications if possible?

## 2020-11-14 NOTE — Telephone Encounter (Signed)
If she feels groggy with the wine, then she should avoid the wine.  I have sent 90-day supply of progesterone, estradiol and NP thyroid to the [facility] listed.  Let me know if she needs other medications also.

## 2020-11-14 NOTE — Telephone Encounter (Signed)
Called patient and left a detailed voice message to call back so I can give her the message from Dr. Anastasio Champion.

## 2020-11-19 NOTE — Telephone Encounter (Signed)
Called patient and gave her the message. Patient verbalized an understanding. 

## 2020-11-29 DIAGNOSIS — M25562 Pain in left knee: Secondary | ICD-10-CM | POA: Diagnosis not present

## 2020-11-29 DIAGNOSIS — R269 Unspecified abnormalities of gait and mobility: Secondary | ICD-10-CM | POA: Diagnosis not present

## 2020-12-03 ENCOUNTER — Telehealth: Payer: Self-pay | Admitting: Psychiatry

## 2020-12-03 NOTE — Telephone Encounter (Signed)
Next visit is 01/03/21. Patient left a voice mail stating that she is uncomfortable with the meds she is taking. Her phone number is [phone].

## 2020-12-03 NOTE — Telephone Encounter (Signed)
Pt is no longer taking the medications. Does not say which medication it was- but new med she was supposed to try made her feel very uncomfortable.

## 2020-12-04 NOTE — Telephone Encounter (Signed)
She is obviously very medication sensitive.  I have done the best I can to try to take that into consideration.  I am trying to work on my schedule to reduce the demand but that is not going to be a quick fix.  I do not have an earlier appointment and because she is so medication sensitive I am not going to start another medication over the phone.  She could consider switching her treatment to Lesle Chris who is more available.

## 2020-12-04 NOTE — Telephone Encounter (Signed)
Pt does not know the name of the med and the last note shows that you increased fluvoxamine.She stated it made her more anxious and "high strung"and she stopped taking it.She is still not taking adderall either.She stated how she is frustrated that it takes a while to hear back from you and she does not like seeing you every 4 months for an appt.I believe she wants to see you sooner to discuss options.

## 2020-12-05 NOTE — Telephone Encounter (Signed)
Pt stated she does not want to see a nurse practitioner because she feels a more experienced psychiatrist will be more helpful for her.However,she does need someone who is more available to her and I explained that we have to pass messages along due to the fact that you are seeing patients and it is difficult to speak to you directly outside of an appt. She then put me on hold to take another call.

## 2020-12-10 ENCOUNTER — Ambulatory Visit: Payer: Medicare Other | Admitting: Psychiatry

## 2020-12-12 ENCOUNTER — Ambulatory Visit (INDEPENDENT_AMBULATORY_CARE_PROVIDER_SITE_OTHER): Payer: Medicare Other | Admitting: Internal Medicine

## 2020-12-19 ENCOUNTER — Ambulatory Visit (INDEPENDENT_AMBULATORY_CARE_PROVIDER_SITE_OTHER): Payer: Medicare Other | Admitting: Psychiatry

## 2020-12-19 ENCOUNTER — Telehealth: Payer: Self-pay | Admitting: Psychiatry

## 2020-12-19 ENCOUNTER — Encounter: Payer: Self-pay | Admitting: Psychiatry

## 2020-12-19 ENCOUNTER — Other Ambulatory Visit: Payer: Medicare Other | Admitting: *Deleted

## 2020-12-19 ENCOUNTER — Other Ambulatory Visit: Payer: Self-pay

## 2020-12-19 VITALS — BP 138/79 | HR 76

## 2020-12-19 DIAGNOSIS — F4323 Adjustment disorder with mixed anxiety and depressed mood: Secondary | ICD-10-CM | POA: Diagnosis not present

## 2020-12-19 DIAGNOSIS — E785 Hyperlipidemia, unspecified: Secondary | ICD-10-CM | POA: Diagnosis not present

## 2020-12-19 DIAGNOSIS — F902 Attention-deficit hyperactivity disorder, combined type: Secondary | ICD-10-CM | POA: Diagnosis not present

## 2020-12-19 DIAGNOSIS — F411 Generalized anxiety disorder: Secondary | ICD-10-CM

## 2020-12-19 DIAGNOSIS — I2510 Atherosclerotic heart disease of native coronary artery without angina pectoris: Secondary | ICD-10-CM | POA: Diagnosis not present

## 2020-12-19 LAB — LIPID PANEL
Chol/HDL Ratio: 2.1 ratio (ref 0.0–4.4)
Cholesterol, Total: 203 mg/dL — ABNORMAL HIGH (ref 100–199)
HDL: 98 mg/dL (ref >39)
LDL Chol Calc (NIH): 89 mg/dL (ref 0–99)
Triglycerides: 93 mg/dL (ref 0–149)
VLDL Cholesterol Cal: 16 mg/dL (ref 5–40)

## 2020-12-19 LAB — HEPATIC FUNCTION PANEL
ALT: 9 IU/L (ref 0–32)
AST: 17 IU/L (ref 0–40)
Albumin: 4.5 g/dL (ref 3.7–4.7)
Alkaline Phosphatase: 60 IU/L (ref 44–121)
Bilirubin Total: 0.4 mg/dL (ref 0.0–1.2)
Bilirubin, Direct: 0.14 mg/dL (ref 0.00–0.40)
Total Protein: 6.5 g/dL (ref 6.0–8.5)

## 2020-12-19 MED ORDER — PROPRANOLOL HCL 10 MG PO TABS: ORAL_TABLET | ORAL | 0 refills | Status: DC

## 2020-12-19 NOTE — Telephone Encounter (Signed)
Pt called in after appt stating that new medication inderal '10mg'$  needs further approval from provider. Pls call [phone]. Pharmacy [facility] [address] [place], [place]

## 2020-12-19 NOTE — Progress Notes (Signed)
Asante [name] EU:[id number] 1950/06/23 70 y.o.  Subjective:   Patient ID:  Courtney [name] is a 70 y.o. (DOB 1950/11/24) female.  Chief Complaint:  Chief Complaint  Patient presents with   Follow-up   Anxiety   Stress   Depression    HPI Courtney [name] presents to the office today for follow-up of first visit 03/01/2020 referred by a friend Courtney [name].  Was prescribed Viibryd and Vyvanse was changed to Concerta 54 mg to try to reduce the amount of dry mouth.  05/02/2020 appointment with the following noted: Less dry mouth with Concerta but didn't seem to last beyond 3-4 PM. Viibryd 20 mg daily.  Hard to get enough calories at breakfast.  But has been taking both in the morning.  Some benefit with Viibryd. Is there something I can take besides a stimulant, bc fears it is stressing her body.  Dx college exhausted adrenal glands.  It helps.  Asked questions about alternatives to stimulants.  Reduced Concerta to reduce anxiety and see if can achieve, but she thinks it's worse in the evening. Anxiety is still a struggle.  Life crisis moment.  Chronic marital dissatisfaction worse now that both are retired.  Feels like she's in a prison.  Family notices she's stressed.  Has done therapy since age 51 yo.  Has done Hindu meditation without help.  Christian faith disciplines.  Grew up caretaking.  I'm done and I need to change.  Goal is calm down enough to function through the situation. Tingling foot and wants B12 testing. Plan: Switch Viibryd to 20 mg in evening meal to get better absorption.  06/18/2020 appointment with following noted: No difference in anxiety or mood with Viibryd 20 mg daily.  She doesn't want to increase it. Questions about Viibryd and Genesight testing.  Asks about differences between Adderall and MPH.   Missed for 3 days Concerta and took Adderall.    Tired by 4-5 PM and doesn't like that.    Plan: She wants to wean off the Viibryd because she does not feel  it has been helpful and she does not want to increase the dosage.  07/03/2020 phone call patient stating she wanted to continue Viibryd 20 mg daily.  She also requested refill of Concerta 54 mg  [id number] appointment with the following noted: Was told she couldn't take Viibryd with one of the pain meds and had TKR.  So stopped it.  Couldn't tolerate pain meds except tramadol.  Had more pain than expected.   Stopped Viibryd about 07/15/20.  She had taken tramadol with Viibryd in the past without a problem.   Noticing more pain after surgery in non-surgical places. Seems to be sensitive to getting lightheaded and confused with low blood sugar.   Poor attention and scattered since surgery.  Also more tired. Occ taken tramadol and otherwise just Tylenol. Impossible to tell the effect of the Viibryd given she hasn't been herself for months. Concerned about H's Courtney [name]'s memory who is also a patient here.  Wants him to get neuropsych testing. Plan because patient is med sensitive we will start very low-dose fluvoxamine 25 mg nightly Per her request continue Concerta 54 mg every morning  10/25/2020 appointment with the following noted: Several phone calls since being here.  The first led to increasing fluvoxamine to 1-1/2 of the 25 mg tablets due to lack of effect at 3 weeks. 09/21/2020 she called asking to stop Concerta which she did. She called again wanting to start Adderall.  Because  she had taken it in 2019 it was agreed that she could start Adderall 10 mg twice daily. Stress dealing with husband and whether to move or not.  [name] separate.  Anxiety is very high.  Hard to calm down around him. $ stress.  Explodes with anger at husband.  Doesn't think he can sell the house on his own. Never been this stressed and anxious and in this kind of circumstance before. Never got the Adderall DT need for PA. Only caffeine in the AM Plan: Rec increase Luvox from 37.5 mg daily to 50 mg HS for a week then increase  to 75 mg daily and possibly higher.  B12 level checked 496 and normal. Hold Adderall until the anxiety is under control.   12/03/2020 phone call from patient: Patient called stating fluvoxamine made her more anxious and high strung and she stopped it.  She wanted an earlier appointment which was not available at the time she was given the option to see a nurse practitioner but refused.  12/19/2020 appointment with the following noted: Life very stressful right now and not getting better.  B in law died and marriage falling apart. Separating.   More than I can handle including anxiety and depression.  Past Psychiatric Medication Trials: Trintellix NR, sertraline?,  Viibryd 20, remote zoloft, Paxil, Cymbalta couple days ? Adverse reaction Fluvoxamine SE anxiety but ? Adequacy of trial bc stress Fluvoxamine 100 in 2015 per Dr. Elbert Ewings, Adderall, concerta History Genesight History of Dr. [name] and Pauline Good  Review of Systems:  Review of Systems  Cardiovascular:  Negative for palpitations.  Neurological:  Negative for tremors and weakness.  Psychiatric/Behavioral:  Positive for decreased concentration and dysphoric mood. Negative for confusion and hallucinations. The patient is nervous/anxious.    Medications: I have reviewed the patient's current medications.  Current Outpatient Medications  Medication Sig Dispense Refill   ALPRAZolam (XANAX) 1 MG tablet Take 0.5-1 mg by mouth daily as needed.     Calcium Carb-Cholecalciferol (CALCIUM 600+D3 PO) Take 1,200 tablets by mouth daily.      estradiol (ESTRACE) 1 MG tablet Take 1.5 tablets (1.5 mg total) by mouth daily. 135 tablet 1   gabapentin (NEURONTIN) 100 MG capsule Take 100 mg by mouth 3 (three) times daily.     meloxicam (MOBIC) 15 MG tablet Take 15 mg by mouth daily.     methocarbamol (ROBAXIN) 500 MG tablet Take 1 tablet (500 mg total) by mouth every 6 (six) hours as needed for muscle spasms. 40 tablet 0   NP THYROID 60  MG tablet Take 1 tablet (60 mg total) by mouth daily before breakfast. 90 tablet 1   omega-3 acid ethyl esters (LOVAZA) 1 g capsule Take 2 capsules (2 g total) by mouth 2 (two) times daily. 120 capsule 11   pravastatin (PRAVACHOL) 20 MG tablet Take one tablet by mouth 4 days per week 48 tablet 3   progesterone (PROMETRIUM) 200 MG capsule Take 2 capsules (400 mg total) by mouth at bedtime. 180 capsule 1   Testosterone 1.62 % GEL Place 5 mg onto the skin daily. 30 g 3   traMADol (ULTRAM) 50 MG tablet TAKE 1/2 TO 1 TABLET(25 TO 50 MG) BY MOUTH BACK EVERY 8 HOURS AS NEEDED FOR SEVERE PAIN 90 tablet 0   vitamin E 400 UNIT capsule Take 800 Units by mouth daily.      amphetamine-dextroamphetamine (ADDERALL) 10 MG tablet Take 1 tablet (10 mg total) by mouth 2 (two) times daily  with a meal. (Patient not taking: No sig reported) 60 tablet 0   nitroGLYCERIN (NITROSTAT) 0.4 MG SL tablet Place 1 tablet (0.4 mg total) under the tongue every 5 (five) minutes as needed for chest pain. 25 tablet 2   pregabalin (LYRICA) 75 MG capsule Take 75 mg by mouth 2 (two) times daily. (Patient not taking: Reported on 12/19/2020)     propranolol (INDERAL) 10 MG tablet Take 1-3 tabs po BID prn anxiety 180 tablet 0   No current facility-administered medications for this visit.    Medication Side Effects: Other: dry mouth  Allergies:  Allergies  Allergen Reactions   Clarithromycin Nausea Only   Other Other (See Comments)   Bactrim [Sulfamethoxazole-Trimethoprim] Nausea And Vomiting   Morphine Nausea And Vomiting    Other reaction(s): Nausea/Vomiting   Morphine And Related Nausea And Vomiting   Azithromycin Nausea And Vomiting and Nausea Only   Ceclor [Cefaclor] Nausea And Vomiting    Can take Augmentin ok   Codeine Nausea And Vomiting   Cymbalta [Duloxetine Hcl]    Erythromycin Nausea And Vomiting    Other reaction(s): Nausea/Vomiting   Erythromycin Base Nausea And Vomiting   Levofloxacin Nausea Only    REACTION:  nausea   Loratadine     REACTION: bruises   Nitrofurantoin Nausea Only   Oxycodone Other (See Comments)   Propoxyphene Nausea Only    dizzy   Propoxyphene N-Acetaminophen Nausea Only    dizzy    Past Medical History:  Diagnosis Date   ADD (attention deficit disorder)    Anxiety    Celiac disease    possible vs IBS   Complication of anesthesia    Coronary artery disease    Depression    no bipolar per Dr. [name]   GI problem    Olevia Perches   Gluten intolerance    Hyperlipidemia    IBS (irritable bowel syndrome)    Osteoarthritis    Osteoporosis    PONV (postoperative nausea and vomiting)    Rheumatoid arthritis (HCC)    Seasonal allergies 01-08-12   hx. of multiple bronchitis related to this.   Shoulder pain, right     Family History  Problem Relation Age of Onset   Heart disease Mother    Heart attack Mother    Prostate cancer Father    Coronary artery disease Other        FH Female 1st degree relative <60   ADD / ADHD Other     Social History   Socioeconomic History   Marital status: Married    Spouse name: Not on file   Number of children: Not on file   Years of education: Not on file   Highest education level: Not on file  Occupational History   Occupation: Futures trader  Tobacco Use   Smoking status: Never   Smokeless tobacco: Never   Tobacco comments:    only Hospital doctor   Vaping Use: Never used  Substance and Sexual Activity   Alcohol use: Yes    Alcohol/week: 0.0 standard drinks    Comment: rare social    Drug use: No   Sexual activity: Yes  Other Topics Concern   Not on file  Social History Narrative   Married for last 42 years.Lives with husband.Retired Insurance account manager.Originally from [place].   Social Determinants of Health   Financial Resource Strain: Not on file  Food Insecurity: Not on file  Transportation Needs: Not on file  Physical Activity: Not on  file  Stress: Not on file  Social Connections: Not on  file  Intimate Partner Violence: Not on file    Past Medical History, Surgical history, Social history, and Family history were reviewed and updated as appropriate.   Please see review of systems for further details on the patient's review from today.   Objective:   Physical Exam:  BP 138/79   Pulse 76   LMP 01/08/1999   Physical Exam Constitutional:      General: She is not in acute distress. Musculoskeletal:        General: No deformity.  Neurological:     Mental Status: She is alert and oriented to person, place, and time.     Coordination: Coordination normal.  Psychiatric:        Attention and Perception: Attention and perception normal. She does not perceive auditory or visual hallucinations.        Mood and Affect: Mood is anxious. Mood is not depressed. Affect is not labile, blunt, angry or inappropriate.        Speech: Speech normal.        Behavior: Behavior is not hyperactive.        Thought Content: Thought content normal. Thought content is not paranoid or delusional. Thought content does not include homicidal or suicidal ideation. Thought content does not include homicidal or suicidal plan.        Cognition and Memory: Cognition and memory normal.        Judgment: Judgment normal.     Comments: Insight intact Hyperactive style ongoing    Lab Review:     Component Value Date/Time   NA 134 (L) 07/17/2020 0241   NA 134 06/14/2018 1444   K 4.4 07/17/2020 0241   CL 101 07/17/2020 0241   CO2 26 07/17/2020 0241   GLUCOSE 161 (H) 07/17/2020 0241   BUN 19 07/17/2020 0241   BUN 10 06/14/2018 1444   CREATININE 0.80 07/17/2020 0241   CREATININE 0.76 04/10/2020 1431   CALCIUM 8.6 (L) 07/17/2020 0241   PROT 6.5 10/17/2020 0942   ALBUMIN 4.4 10/17/2020 0942   AST 15 10/17/2020 0942   ALT 10 10/17/2020 0942   ALKPHOS 62 10/17/2020 0942   BILITOT 0.3 10/17/2020 0942   GFRNONAA >60 07/17/2020 0241   GFRNONAA 79 04/10/2020 1431   GFRAA 91 04/10/2020 1431        Component Value Date/Time   WBC 6.8 07/17/2020 0241   RBC 3.09 (L) 07/17/2020 0241   HGB 9.7 (L) 07/17/2020 0241   HCT 29.9 (L) 07/17/2020 0241   PLT 268 07/17/2020 0241   MCV 96.8 07/17/2020 0241   MCH 31.4 07/17/2020 0241   MCHC 32.4 07/17/2020 0241   RDW 12.0 07/17/2020 0241   LYMPHSABS 2.0 06/14/2018 0005   MONOABS 0.7 06/14/2018 0005   EOSABS 0.2 06/14/2018 0005   BASOSABS 0.0 06/14/2018 0005    No results found for: POCLITH, LITHIUM   No results found for: PHENYTOIN, PHENOBARB, VALPROATE, CBMZ   06/04/20 B12 normal at 496  .res Assessment: Plan:    Rus was seen today for follow-up, anxiety, stress and depression.  Diagnoses and all orders for this visit:  Generalized anxiety disorder -     propranolol (INDERAL) 10 MG tablet; Take 1-3 tabs po BID prn anxiety  Attention deficit hyperactivity disorder (ADHD), combined type  Adjustment disorder with mixed anxiety and depressed mood  Greater than 50% of 30 min face to face time with patient was spent on counseling  and coordination of care. We discussed  Genesight testing and her desire to take something for anxiety that is in the "Use as Directed" column.   Rec trial beta blocker propranolol 10-30 mg  twice daily for anxiety  Consider TCA bc Genesight test   B12 level checked 496 and normal.  Hold Adderall until the anxiety is under control.  . Dial[facility]ical Behavior Therapy at [facility]   Supportive therapy around marital crisis and other stressors.  Also extensive discussion about her frustration with availability of psychiatrist appts.  Disc boundaries around this issues and how to get messages back and forth.  Started therapy with Godfrey Pick.  Reviewed Genesight testing more extensively today in detail for 30 mins.  She has a copy.  FU 8 weeks  Lynder Parents, MD, DFAPA   Please see After Visit Summary for patient specific instructions.  Future Appointments  Date Time Provider  [place]  01/03/2021 10:00 AM Cottle, Billey Co., MD [facility] None  05/01/2021  7:45 AM [facility]  05/01/2021  9:00 AM [facility] 4 [facility]    No orders of the defined types were placed in this encounter.   -------------------------------

## 2020-12-19 NOTE — Telephone Encounter (Signed)
Pt called back and said it was a glitch in the system and they are filling it

## 2020-12-24 ENCOUNTER — Encounter: Payer: Self-pay | Admitting: Internal Medicine

## 2020-12-28 DIAGNOSIS — M25562 Pain in left knee: Secondary | ICD-10-CM | POA: Diagnosis not present

## 2020-12-28 DIAGNOSIS — R269 Unspecified abnormalities of gait and mobility: Secondary | ICD-10-CM | POA: Diagnosis not present

## 2021-01-01 ENCOUNTER — Telehealth: Payer: Self-pay | Admitting: Psychiatry

## 2021-01-01 NOTE — Telephone Encounter (Signed)
Please verify she tried up to 30 mg twice daily.  If no improvement, has she taken buspirone before?  I didn't see it in her chart.  If not it's very mild with a low risk of causing nausea or dizziness.  We should start with low dose buspirone 10 mg 1/2 table twice daily for a week then 1 twice daily.

## 2021-01-01 NOTE — Telephone Encounter (Signed)
Rtc to pt and she reports trying the propranolol but did not see any difference with relieving her anxiety. She reports not taking it consistently just prn and didn't know if that would have any effect on it. I informed her usually that medication is out of her system in about 8 hours. She does have an apt on 9/08, and does have a conflict but will cancel her other apt if she does need to come in to get something else prescribed for anxiety. Informed her I would discuss with Dr. Clovis Pu if she does need to come in for apt since she has multiple tried and failed medications. She was appreciative of follow up call. I did give her my (nurse) direct number so she doesn't have further issues getting through.

## 2021-01-01 NOTE — Telephone Encounter (Signed)
Pt called and said that she wants to speak to traci about the medicine she is taking. Please call her at [phone]

## 2021-01-02 NOTE — Telephone Encounter (Signed)
Rtc to patient and she did take up to 30 mg bid on Inderal. I mentioned Buspirone, which she did not think she has taken. She pulled up the GeneSight test results and gave me the category it was in. Listed with Valium, and Xanax which she has taken both of those and they are effective. Informed her Buspirone is a medication she would take routinely and not prn for it to be effective. She verbalized understanding.   I apologized I could not pull up her Gene Sight results, I thought they were suppose to be scanned under Media but I did not find it.  Anyway she also reports she needs something to help her depression and low motivation. She goes back and forth between anxiety and depression, not wanting to do anything. She has previously been on Adderall, I asked if she would like to retry that to help as well and she didn't really know. She looked over Gene Sight results and read off Desipramine and Nortriptyline as possibilities. I discussed their category and what they would provide. Informed her I would have Dr. Clovis Pu have her GeneSight results at her visit in the morning, feel like that helps her reassurance. I also explained these are suggestions and it did not mean it would definitely work.   She feels like she is not getting anywhere with her medication, Dr. Clovis Pu is cautious with treatment she reports. Informed her I would discuss this with Dr. Clovis Pu so he's aware of how she is feeling and her symptoms. She was very appreciative of the call. She will attend the apt in the morning as scheduled at 10 am.

## 2021-01-02 NOTE — Telephone Encounter (Signed)
Thank you very much for the information

## 2021-01-03 ENCOUNTER — Other Ambulatory Visit: Payer: Self-pay

## 2021-01-03 ENCOUNTER — Ambulatory Visit (INDEPENDENT_AMBULATORY_CARE_PROVIDER_SITE_OTHER): Payer: Medicare Other | Admitting: Psychiatry

## 2021-01-03 ENCOUNTER — Encounter: Payer: Self-pay | Admitting: Psychiatry

## 2021-01-03 VITALS — BP 143/75 | HR 70

## 2021-01-03 DIAGNOSIS — R202 Paresthesia of skin: Secondary | ICD-10-CM

## 2021-01-03 DIAGNOSIS — Z79899 Other long term (current) drug therapy: Secondary | ICD-10-CM

## 2021-01-03 DIAGNOSIS — I2510 Atherosclerotic heart disease of native coronary artery without angina pectoris: Secondary | ICD-10-CM

## 2021-01-03 DIAGNOSIS — F902 Attention-deficit hyperactivity disorder, combined type: Secondary | ICD-10-CM | POA: Diagnosis not present

## 2021-01-03 DIAGNOSIS — F411 Generalized anxiety disorder: Secondary | ICD-10-CM

## 2021-01-03 DIAGNOSIS — F331 Major depressive disorder, recurrent, moderate: Secondary | ICD-10-CM

## 2021-01-03 MED ORDER — IMIPRAMINE HCL 25 MG PO TABS: ORAL_TABLET | ORAL | 1 refills | Status: DC

## 2021-01-03 NOTE — Progress Notes (Signed)
Courtney [name] [id number] April 23, 1951 70 y.o.  Subjective:   Patient ID:  Courtney [name] is a 70 y.o. (DOB 07-Dec-1950) female.  Chief Complaint:  Chief Complaint  Patient presents with   Follow-up   Anxiety    HPI Courtney [name] presents to the office today for follow-up of first visit 03/01/2020 referred by a friend [name].  Was prescribed Viibryd and Vyvanse was changed to Concerta 54 mg to try to reduce the amount of dry mouth.  05/02/2020 appointment with the following noted: Less dry mouth with Concerta but didn't seem to last beyond 3-4 PM. Viibryd 20 mg daily.  Hard to get enough calories at breakfast.  But has been taking both in the morning.  Some benefit with Viibryd. Is there something I can take besides a stimulant, bc fears it is stressing her body.  Dx college exhausted adrenal glands.  It helps.  Asked questions about alternatives to stimulants.  Reduced Concerta to reduce anxiety and see if can achieve, but she thinks it's worse in the evening. Anxiety is still a struggle.  Life crisis moment.  Chronic marital dissatisfaction worse now that both are retired.  Feels like she's in a prison.  Family notices she's stressed.  Has done therapy since age 89 yo.  Has done Hindu meditation without help.  Christian faith disciplines.  Grew up caretaking.  I'm done and I need to change.  Goal is calm down enough to function through the situation. Tingling foot and wants B12 testing. Plan: Switch Viibryd to 20 mg in evening meal to get better absorption.  06/18/2020 appointment with following noted: No difference in anxiety or mood with Viibryd 20 mg daily.  She doesn't want to increase it. Questions about Viibryd and Genesight testing.  Asks about differences between Adderall and MPH.   Missed for 3 days Concerta and took Adderall.    Tired by 4-5 PM and doesn't like that.    Plan: She wants to wean off the Viibryd because she does not feel it has been helpful and  she does not want to increase the dosage.  07/03/2020 phone call patient stating she wanted to continue Viibryd 20 mg daily.  She also requested refill of Concerta 54 mg  [id number] appointment with the following noted: Was told she couldn't take Viibryd with one of the pain meds and had TKR.  So stopped it.  Couldn't tolerate pain meds except tramadol.  Had more pain than expected.   Stopped Viibryd about 07/15/20.  She had taken tramadol with Viibryd in the past without a problem.   Noticing more pain after surgery in non-surgical places. Seems to be sensitive to getting lightheaded and confused with low blood sugar.   Poor attention and scattered since surgery.  Also more tired. Occ taken tramadol and otherwise just Tylenol. Impossible to tell the effect of the Viibryd given she hasn't been herself for months. Concerned about H's Geoff's memory who is also a patient here.  Wants him to get neuropsych testing. Plan because patient is med sensitive we will start very low-dose fluvoxamine 25 mg nightly Per her request continue Concerta 54 mg every morning  10/25/2020 appointment with the following noted: Several phone calls since being here.  The first led to increasing fluvoxamine to 1-1/2 of the 25 mg tablets due to lack of effect at 3 weeks. 09/21/2020 she called asking to stop Concerta which she did. She called again wanting to start Adderall.  Because she had taken it in 2019  it was agreed that she could start Adderall 10 mg twice daily. Stress dealing with husband and whether to move or not.  May separate.  Anxiety is very high.  Hard to calm down around him. $ stress.  Explodes with anger at husband.  Doesn't think he can sell the house on his own. Never been this stressed and anxious and in this kind of circumstance before. Never got the Adderall DT need for PA. Only caffeine in the AM Plan: Rec increase Luvox from 37.5 mg daily to 50 mg HS for a week then increase to 75 mg daily and  possibly higher.  B12 level checked 496 and normal. Hold Adderall until the anxiety is under control.   12/03/2020 phone call from patient: Patient called stating fluvoxamine made her more anxious and high strung and she stopped it.  She wanted an earlier appointment which was not available at the time she was given the option to see a nurse practitioner but refused.  12/19/2020 appointment with the following noted: Life very stressful right now and not getting better.  B in law died and marriage falling apart. Separating.   More than I can handle including anxiety and depression. Plan: Rec trial beta blocker propranolol 10-30 mg  twice daily for anxiety Consider TCA bc Genesight test  B12 level checked 496 and normal. Hold Adderall until the anxiety is under control.   01/01/2021 phone call from patient with nurse as follows: She is taking up to 30 mg propranolol twice daily without sufficient benefit for her anxiety.  01/03/2021 appointment with the following noted: Rare Adderall.  Propranolol didn't help anxiety much at 30 mg BID without SE.[name]   Thinks she's gotten depressed which is unusual.  In a perfect storm with divorce and moving and financial stress.  Not functioning well.  Hard to make a decision.  Poor productivity.   Having a lot of pain, chronically and worse lately. Starting Lyrica today. Taking alprazolam about 0.5 mg daily.    Past Psychiatric Medication Trials: Trintellix NR, sertraline?,  Viibryd 20, remote zoloft, Paxil, Cymbalta couple days ? Adverse reaction Fluvoxamine SE anxiety but ? Adequacy of trial bc stress Fluvoxamine 100 in 2015 per Dr. [name] Gabapentin 1600 NR Lyrica  Vyvanse, Adderall, concerta History Genesight History of Dr. [name] and Pauline Good  Review of Systems:  Review of Systems  Cardiovascular:  Negative for palpitations.  Musculoskeletal:  Positive for back pain.  Neurological:  Positive for weakness. Negative for tremors.   Psychiatric/Behavioral:  Positive for decreased concentration and dysphoric mood. Negative for confusion and hallucinations. The patient is nervous/anxious.    Medications: I have reviewed the patient's current medications.  Current Outpatient Medications  Medication Sig Dispense Refill   ALPRAZolam (XANAX) 1 MG tablet Take 0.5-1 mg by mouth daily as needed.     Calcium Carb-Cholecalciferol (CALCIUM 600+D3 PO) Take 1,200 tablets by mouth daily.      estradiol (ESTRACE) 1 MG tablet Take 1.5 tablets (1.5 mg total) by mouth daily. 135 tablet 1   meloxicam (MOBIC) 15 MG tablet Take 15 mg by mouth daily.     methocarbamol (ROBAXIN) 500 MG tablet Take 1 tablet (500 mg total) by mouth every 6 (six) hours as needed for muscle spasms. 40 tablet 0   NP THYROID 60 MG tablet Take 1 tablet (60 mg total) by mouth daily before breakfast. 90 tablet 1   omega-3 acid ethyl esters (LOVAZA) 1 g capsule Take 2 capsules (2 g total) by mouth  2 (two) times daily. 120 capsule 11   pravastatin (PRAVACHOL) 20 MG tablet Take one tablet by mouth 4 days per week 48 tablet 3   progesterone (PROMETRIUM) 200 MG capsule Take 2 capsules (400 mg total) by mouth at bedtime. 180 capsule 1   propranolol (INDERAL) 10 MG tablet TAKE 1 TO 3 TABLETS BY MOUTH TWICE DAILY AS NEEDED FOR ANXIETY 540 tablet 0   Testosterone 1.62 % GEL Place 5 mg onto the skin daily. 30 g 3   traMADol (ULTRAM) 50 MG tablet TAKE 1/2 TO 1 TABLET(25 TO 50 MG) BY MOUTH BACK EVERY 8 HOURS AS NEEDED FOR SEVERE PAIN 90 tablet 0   vitamin E 400 UNIT capsule Take 800 Units by mouth daily.      amphetamine-dextroamphetamine (ADDERALL) 10 MG tablet Take 1 tablet (10 mg total) by mouth 2 (two) times daily with a meal. (Patient not taking: Reported on 01/03/2021) 60 tablet 0   imipramine (TOFRANIL) 25 MG tablet 1 at night for 5 night, then 2 at night for 5 nights, then 3 at night 90 tablet 1   nitroGLYCERIN (NITROSTAT) 0.4 MG SL tablet Place 1 tablet (0.4 mg total) under  the tongue every 5 (five) minutes as needed for chest pain. 25 tablet 2   pregabalin (LYRICA) 75 MG capsule Take 75 mg by mouth 2 (two) times daily. (Patient not taking: Reported on 01/03/2021)     No current facility-administered medications for this visit.    Medication Side Effects: Other: dry mouth  Allergies:  Allergies  Allergen Reactions   Clarithromycin Nausea Only   Other Other (See Comments)   Bactrim [Sulfamethoxazole-Trimethoprim] Nausea And Vomiting   Morphine Nausea And Vomiting    Other reaction(s): Nausea/Vomiting   Morphine And Related Nausea And Vomiting   Azithromycin Nausea And Vomiting and Nausea Only   Ceclor [Cefaclor] Nausea And Vomiting    Can take Augmentin ok   Codeine Nausea And Vomiting   Cymbalta [Duloxetine Hcl]    Erythromycin Nausea And Vomiting    Other reaction(s): Nausea/Vomiting   Erythromycin Base Nausea And Vomiting   Levofloxacin Nausea Only    REACTION: nausea   Loratadine     REACTION: bruises   Nitrofurantoin Nausea Only   Oxycodone Other (See Comments)   Propoxyphene Nausea Only    dizzy   Propoxyphene N-Acetaminophen Nausea Only    dizzy    Past Medical History:  Diagnosis Date   ADD (attention deficit disorder)    Anxiety    Celiac disease    possible vs IBS   Complication of anesthesia    Coronary artery disease    Depression    no bipolar per Dr. [name]   GI problem    Olevia Perches   Gluten intolerance    Hyperlipidemia    IBS (irritable bowel syndrome)    Osteoarthritis    Osteoporosis    PONV (postoperative nausea and vomiting)    Rheumatoid arthritis (HCC)    Seasonal allergies 01-08-12   hx. of multiple bronchitis related to this.   Shoulder pain, right     Family History  Problem Relation Age of Onset   Heart disease Mother    Heart attack Mother    Prostate cancer Father    Coronary artery disease Other        FH Female 1st degree relative <60   ADD / ADHD Other     Social History   Socioeconomic  History   Marital status: Married  Spouse name: Not on file   Number of children: Not on file   Years of education: Not on file   Highest education level: Not on file  Occupational History   Occupation: Futures trader  Tobacco Use   Smoking status: Never   Smokeless tobacco: Never   Tobacco comments:    only social  Vaping Use   Vaping Use: Never used  Substance and Sexual Activity   Alcohol use: Yes    Alcohol/week: 0.0 standard drinks    Comment: rare social    Drug use: No   Sexual activity: Yes  Other Topics Concern   Not on file  Social History Narrative   Married for last 42 years.Lives with husband.Retired Insurance account manager.Originally from [place].   Social Determinants of Health   Financial Resource Strain: Not on file  Food Insecurity: Not on file  Transportation Needs: Not on file  Physical Activity: Not on file  Stress: Not on file  Social Connections: Not on file  Intimate Partner Violence: Not on file    Past Medical History, Surgical history, Social history, and Family history were reviewed and updated as appropriate.   Please see review of systems for further details on the patient's review from today.   Objective:   Physical Exam:  BP (!) 143/75   Pulse 70   LMP 01/08/1999   Physical Exam Constitutional:      General: She is not in acute distress. Musculoskeletal:        General: No deformity.  Neurological:     Mental Status: She is alert and oriented to person, place, and time.     Coordination: Coordination normal.  Psychiatric:        Attention and Perception: Attention and perception normal. She does not perceive auditory or visual hallucinations.        Mood and Affect: Mood is anxious and depressed. Affect is not labile, blunt, angry or inappropriate.        Speech: Speech normal.        Behavior: Behavior is not hyperactive.        Thought Content: Thought content normal. Thought content is not paranoid or  delusional. Thought content does not include homicidal or suicidal ideation. Thought content does not include homicidal or suicidal plan.        Cognition and Memory: Cognition and memory normal.        Judgment: Judgment normal.     Comments: Insight intact Hyperactive style ongoing but less intense and more depressed    Lab Review:     Component Value Date/Time   NA 134 (L) 07/17/2020 0241   NA 134 06/14/2018 1444   K 4.4 07/17/2020 0241   CL 101 07/17/2020 0241   CO2 26 07/17/2020 0241   GLUCOSE 161 (H) 07/17/2020 0241   BUN 19 07/17/2020 0241   BUN 10 06/14/2018 1444   CREATININE 0.80 07/17/2020 0241   CREATININE 0.76 04/10/2020 1431   CALCIUM 8.6 (L) 07/17/2020 0241   PROT 6.5 12/19/2020 0754   ALBUMIN 4.5 12/19/2020 0754   AST 17 12/19/2020 0754   ALT 9 12/19/2020 0754   ALKPHOS 60 12/19/2020 0754   BILITOT 0.4 12/19/2020 0754   GFRNONAA >60 07/17/2020 0241   GFRNONAA 79 04/10/2020 1431   GFRAA 91 04/10/2020 1431       Component Value Date/Time   WBC 6.8 07/17/2020 0241   RBC 3.09 (L) 07/17/2020 0241   HGB 9.7 (L) 07/17/2020 0241   HCT  29.9 (L) 07/17/2020 0241   PLT 268 07/17/2020 0241   MCV 96.8 07/17/2020 0241   MCH 31.4 07/17/2020 0241   MCHC 32.4 07/17/2020 0241   RDW 12.0 07/17/2020 0241   LYMPHSABS 2.0 06/14/2018 0005   MONOABS 0.7 06/14/2018 0005   EOSABS 0.2 06/14/2018 0005   BASOSABS 0.0 06/14/2018 0005    No results found for: POCLITH, LITHIUM   No results found for: PHENYTOIN, PHENOBARB, VALPROATE, CBMZ   06/04/20 B12 normal at 496  .res Assessment: Plan:    Courtney [name] was seen today for follow-up and anxiety.  Diagnoses and all orders for this visit:  Major depressive disorder, recurrent episode, moderate (HCC) -     Imipramine with  desipramine, blood -     imipramine (TOFRANIL) 25 MG tablet; 1 at night for 5 night, then 2 at night for 5 nights, then 3 at night  Generalized anxiety disorder -     Imipramine with  desipramine, blood -      imipramine (TOFRANIL) 25 MG tablet; 1 at night for 5 night, then 2 at night for 5 nights, then 3 at night  High risk medication use -     Imipramine with  desipramine, blood  Attention deficit hyperactivity disorder (ADHD), combined type  Paresthesias  Greater than 50% of 30 min face to face time with patient was spent on counseling and coordination of care. We discussed  Genesight testing and her desire to take something for anxiety that is in the "Use as Directed" column.   DC propranolol 10-30 mg  twice daily for anxiety bc not helpful  Consider TCA bc Genesight test;  disc SE in detail incl cardiac, etc Yes imipramine 25 and increase to 75 mg HS and then check blood level   B12 level checked 496 and normal.  Hold Adderall until the anxiety is under control.  . D[facility] Behavior Therapy at [facility]   Supportive therapy around marital crisis and other stressors.  Also extensive discussion about her frustration with availability of psychiatrist appts.  Disc boundaries around this issues and how to get messages back and forth.  Started therapy with Godfrey Pick.  Reviewed Genesight testing more extensively today in detail for 30 mins.  She has a copy.  FU 8 weeks  Lynder Parents, MD, DFAPA   Please see After Visit Summary for patient specific instructions.  Future Appointments  Date Time Provider [place]  01/11/2021  5:00 PM [place] ADVISOR [id number] None  02/26/2021  9:00 AM Cottle, Billey Co., MD [facility] None  03/28/2021  1:30 PM Cottle, Billey Co., MD [facility] None  05/01/2021  7:45 AM [facility]  05/01/2021  9:00 AM [facility] 4 [facility]    Orders Placed This Encounter  Procedures   Imipramine with  desipramine, blood     -------------------------------

## 2021-01-11 ENCOUNTER — Ambulatory Visit: Payer: Medicare Other

## 2021-01-21 ENCOUNTER — Ambulatory Visit: Payer: Medicare Other

## 2021-01-21 DIAGNOSIS — F331 Major depressive disorder, recurrent, moderate: Secondary | ICD-10-CM | POA: Diagnosis not present

## 2021-01-21 DIAGNOSIS — F411 Generalized anxiety disorder: Secondary | ICD-10-CM | POA: Diagnosis not present

## 2021-01-21 DIAGNOSIS — Z79899 Other long term (current) drug therapy: Secondary | ICD-10-CM | POA: Diagnosis not present

## 2021-01-23 ENCOUNTER — Ambulatory Visit (INDEPENDENT_AMBULATORY_CARE_PROVIDER_SITE_OTHER): Payer: Medicare Other

## 2021-01-23 DIAGNOSIS — Z0000 Encounter for general adult medical examination without abnormal findings: Secondary | ICD-10-CM | POA: Diagnosis not present

## 2021-01-23 LAB — IMIPRAMINE WITH  DESIPRAMINE, BLOOD
Desipramine: 40 mcg/L
Imipramine Lvl: 25 mcg/L
Imipramine and Desipramine: 65 mcg/L — ABNORMAL LOW (ref 150–250)

## 2021-01-23 NOTE — Patient Instructions (Signed)
Courtney [name] , Thank you for taking time to come for your Medicare Wellness Visit. I appreciate your ongoing commitment to your health goals. Please review the following plan we discussed and let me know if I can assist you in the future.   Screening recommendations/referrals: Colonoscopy: 03/2015 per patient due every 10 years Mammogram: 03/29/2020; due every year Bone Density: 11/23/2018; due every 2 years Recommended yearly ophthalmology/optometry visit for glaucoma screening and checkup Recommended yearly dental visit for hygiene and checkup  Vaccinations: Influenza vaccine: due Fall 2022 Pneumococcal vaccine: 04/16/2016, 04/05/2018 Tdap vaccine: 07/04/2020; due every 10 years Shingles vaccine: 03/10/2017, 01/06/2017   Covid-19: 05/13/2019, 06/13/2019  Advanced directives: Please bring a copy of your health care power of attorney and living will to the office at your convenience.  Conditions/risks identified: Yes; Client understands the importance of follow-up with providers by attending scheduled visits and discussed goals to eat healthier, increase physical activity, exercise the brain, socialize more, get enough sleep and make time for laughter.  Next appointment: Please schedule your next Medicare Wellness Visit with your Nurse Health Advisor in 1 year by calling [phone].   Preventive Care 5 Years and Older, Female Preventive care refers to lifestyle choices and visits with your health care provider that can promote health and wellness. What does preventive care include? A yearly physical exam. This is also called an annual well check. Dental exams once or twice a year. Routine eye exams. Ask your health care provider how often you should have your eyes checked. Personal lifestyle choices, including: Daily care of your teeth and gums. Regular physical activity. Eating a healthy diet. Avoiding tobacco and drug use. Limiting alcohol use. Practicing safe sex. Taking  low-dose aspirin every day. Taking vitamin and mineral supplements as recommended by your health care provider. What happens during an annual well check? The services and screenings done by your health care provider during your annual well check will depend on your age, overall health, lifestyle risk factors, and family history of disease. Counseling  Your health care provider may ask you questions about your: Alcohol use. Tobacco use. Drug use. Emotional well-being. Home and relationship well-being. Sexual activity. Eating habits. History of falls. Memory and ability to understand (cognition). Work and work Statistician. Reproductive health. Screening  You may have the following tests or measurements: Height, weight, and BMI. Blood pressure. Lipid and cholesterol levels. These may be checked every 5 years, or more frequently if you are over 23 years old. Skin check. Lung cancer screening. You may have this screening every year starting at age 4 if you have a 30-pack-year history of smoking and currently smoke or have quit within the past 15 years. Fecal occult blood test (FOBT) of the stool. You may have this test every year starting at age 18. Flexible sigmoidoscopy or colonoscopy. You may have a sigmoidoscopy every 5 years or a colonoscopy every 10 years starting at age 75. Hepatitis C blood test. Hepatitis B blood test. Sexually transmitted disease (STD) testing. Diabetes screening. This is done by checking your blood sugar (glucose) after you have not eaten for a while (fasting). You may have this done every 1-3 years. Bone density scan. This is done to screen for osteoporosis. You may have this done starting at age 73. Mammogram. This may be done every 1-2 years. Talk to your health care provider about how often you should have regular mammograms. Talk with your health care provider about your test results, treatment options, and if necessary, the need  for more tests. Vaccines   Your health care provider may recommend certain vaccines, such as: Influenza vaccine. This is recommended every year. Tetanus, diphtheria, and acellular pertussis (Tdap, Td) vaccine. You may need a Td booster every 10 years. Zoster vaccine. You may need this after age 24. Pneumococcal 13-valent conjugate (PCV13) vaccine. One dose is recommended after age 73. Pneumococcal polysaccharide (PPSV23) vaccine. One dose is recommended after age 12. Talk to your health care provider about which screenings and vaccines you need and how often you need them. This information is not intended to replace advice given to you by your health care provider. Make sure you discuss any questions you have with your health care provider. Document Released: 05/11/2015 Document Revised: 01/02/2016 Document Reviewed: 02/13/2015 Elsevier Interactive Patient Education  2017 [place] Prevention in the Home Falls can cause injuries. They can happen to people of all ages. There are many things you can do to make your home safe and to help prevent falls. What can I do on the outside of my home? Regularly fix the edges of walkways and driveways and fix any cracks. Remove anything that might make you trip as you walk through a door, such as a raised step or threshold. Trim any bushes or trees on the path to your home. Use bright outdoor lighting. Clear any walking paths of anything that might make someone trip, such as rocks or tools. Regularly check to see if handrails are loose or broken. Make sure that both sides of any steps have handrails. Any raised decks and porches should have guardrails on the edges. Have any leaves, snow, or ice cleared regularly. Use sand or salt on walking paths during winter. Clean up any spills in your garage right away. This includes oil or grease spills. What can I do in the bathroom? Use night lights. Install grab bars by the toilet and in the tub and shower. Do not use towel  bars as grab bars. Use non-skid mats or decals in the tub or shower. If you need to sit down in the shower, use a plastic, non-slip stool. Keep the floor dry. Clean up any water that spills on the floor as soon as it happens. Remove soap buildup in the tub or shower regularly. Attach bath mats securely with double-sided non-slip rug tape. Do not have throw rugs and other things on the floor that can make you trip. What can I do in the bedroom? Use night lights. Make sure that you have a light by your bed that is easy to reach. Do not use any sheets or blankets that are too big for your bed. They should not hang down onto the floor. Have a firm chair that has side arms. You can use this for support while you get dressed. Do not have throw rugs and other things on the floor that can make you trip. What can I do in the kitchen? Clean up any spills right away. Avoid walking on wet floors. Keep items that you use a lot in easy-to-reach places. If you need to reach something above you, use a strong step stool that has a grab bar. Keep electrical cords out of the way. Do not use floor polish or wax that makes floors slippery. If you must use wax, use non-skid floor wax. Do not have throw rugs and other things on the floor that can make you trip. What can I do with my stairs? Do not leave any items on the stairs.  Make sure that there are handrails on both sides of the stairs and use them. Fix handrails that are broken or loose. Make sure that handrails are as long as the stairways. Check any carpeting to make sure that it is firmly attached to the stairs. Fix any carpet that is loose or worn. Avoid having throw rugs at the top or bottom of the stairs. If you do have throw rugs, attach them to the floor with carpet tape. Make sure that you have a light switch at the top of the stairs and the bottom of the stairs. If you do not have them, ask someone to add them for you. What else can I do to help  prevent falls? Wear shoes that: Do not have high heels. Have rubber bottoms. Are comfortable and fit you well. Are closed at the toe. Do not wear sandals. If you use a stepladder: Make sure that it is fully opened. Do not climb a closed stepladder. Make sure that both sides of the stepladder are locked into place. Ask someone to hold it for you, if possible. Clearly mark and make sure that you can see: Any grab bars or handrails. First and last steps. Where the edge of each step is. Use tools that help you move around (mobility aids) if they are needed. These include: Canes. Walkers. Scooters. Crutches. Turn on the lights when you go into a dark area. Replace any light bulbs as soon as they burn out. Set up your furniture so you have a clear path. Avoid moving your furniture around. If any of your floors are uneven, fix them. If there are any pets around you, be aware of where they are. Review your medicines with your doctor. Some medicines can make you feel dizzy. This can increase your chance of falling. Ask your doctor what other things that you can do to help prevent falls. This information is not intended to replace advice given to you by your health care provider. Make sure you discuss any questions you have with your health care provider. Document Released: 02/08/2009 Document Revised: 09/20/2015 Document Reviewed: 05/19/2014 Elsevier Interactive Patient Education  2017 [employer].

## 2021-01-23 NOTE — Progress Notes (Addendum)
I connected with Courtney [name] today by telephone and verified that I am speaking with the correct person using two identifiers. Location patient: home Location provider: work Persons participating in the virtual visit: patient, provider.   I discussed the limitations, risks, security and privacy concerns of performing an evaluation and management service by telephone and the availability of in person appointments. I also discussed with the patient that there may be a patient responsible charge related to this service. The patient expressed understanding and verbally consented to this telephonic visit.    Interactive audio and video telecommunications were attempted between this provider and patient, however failed, due to patient having technical difficulties OR patient did not have access to video capability.  We continued and completed visit with audio only.  Some vital signs may be absent or patient reported.   Time Spent with patient on telephone encounter: 40 minutes  Subjective:   Courtney [name] is a 70 y.o. female who presents for an Initial Medicare Annual Wellness Visit.  Review of Systems     Cardiac Risk Factors include: advanced age (>65mn, >>49women);dyslipidemia;family history of premature cardiovascular disease     Objective:    There were no vitals filed for this visit. There is no height or weight on file to calculate BMI.  Advanced Directives 01/23/2021 07/16/2020 07/06/2020 04/17/2017 01/14/2012 01/08/2012  Does Patient Have a Medical Advance Directive? Yes Yes Yes Yes Patient has advance directive, copy not in chart Patient does not have advance directive  Type of Advance Directive Living will;Healthcare Power of [place] - -  Does patient want to make changes to medical advance directive? No - Patient declined No - Patient declined -  No - Patient declined - -  Copy of H[place]risin Chart? No - copy requested No - copy requested - No - copy requested - -  Pre-existing out of facility DNR order (yellow form or pink MOST form) - - - - No No    Current Medications (verified) Outpatient Encounter Medications as of 01/23/2021  Medication Sig   ALPRAZolam (XANAX) 1 MG tablet Take 0.5-1 mg by mouth daily as needed.   Calcium Carb-Cholecalciferol (CALCIUM 600+D3 PO) Take 1,200 tablets by mouth daily.    estradiol (ESTRACE) 1 MG tablet Take 1.5 tablets (1.5 mg total) by mouth daily.   imipramine (TOFRANIL) 25 MG tablet 1 at night for 5 night, then 2 at night for 5 nights, then 3 at night   meloxicam (MOBIC) 15 MG tablet Take 15 mg by mouth daily.   NP THYROID 60 MG tablet Take 1 tablet (60 mg total) by mouth daily before breakfast.   omega-3 acid ethyl esters (LOVAZA) 1 g capsule Take 2 capsules (2 g total) by mouth 2 (two) times daily.   pravastatin (PRAVACHOL) 20 MG tablet Take one tablet by mouth 4 days per week   progesterone (PROMETRIUM) 200 MG capsule Take 2 capsules (400 mg total) by mouth at bedtime.   Testosterone 1.62 % GEL Place 5 mg onto the skin daily.   traMADol (ULTRAM) 50 MG tablet TAKE 1/2 TO 1 TABLET(25 TO 50 MG) BY MOUTH BACK EVERY 8 HOURS AS NEEDED FOR SEVERE PAIN   vitamin E 400 UNIT capsule Take 800 Units by mouth daily.    amphetamine-dextroamphetamine (ADDERALL) 10 MG tablet Take 1 tablet (10 mg total) by mouth 2 (two) times daily with a meal. (Patient  not taking: No sig reported)   methocarbamol (ROBAXIN) 500 MG tablet Take 1 tablet (500 mg total) by mouth every 6 (six) hours as needed for muscle spasms. (Patient not taking: Reported on 01/23/2021)   nitroGLYCERIN (NITROSTAT) 0.4 MG SL tablet Place 1 tablet (0.4 mg total) under the tongue every 5 (five) minutes as needed for chest pain.   pregabalin (LYRICA) 75 MG capsule Take 75 mg by mouth 2 (two) times daily. (Patient not taking: No sig  reported)   propranolol (INDERAL) 10 MG tablet TAKE 1 TO 3 TABLETS BY MOUTH TWICE DAILY AS NEEDED FOR ANXIETY (Patient not taking: Reported on 01/23/2021)   No facility-administered encounter medications on file as of 01/23/2021.    Allergies (verified) Clarithromycin, Covid-19 (mrna) vaccine, Other, Bactrim [sulfamethoxazole-trimethoprim], Morphine, Morphine and related, Azithromycin, Ceclor [cefaclor], Codeine, Cymbalta [duloxetine hcl], Erythromycin, Erythromycin base, Levofloxacin, Loratadine, Nitrofurantoin, Oxycodone, Penicillins, Propoxyphene, and Propoxyphene n-acetaminophen   History: Past Medical History:  Diagnosis Date   ADD (attention deficit disorder)    Anxiety    Celiac disease    possible vs IBS   Complication of anesthesia    Coronary artery disease    Depression    no bipolar per Dr. [name]   GI problem    Olevia Perches   Gluten intolerance    Hyperlipidemia    IBS (irritable bowel syndrome)    Osteoarthritis    Osteoporosis    PONV (postoperative nausea and vomiting)    Rheumatoid arthritis ([place])    Seasonal allergies 01-08-12   hx. of multiple bronchitis related to this.   Shoulder pain, right    Past Surgical History:  Procedure Laterality Date   EXPLORATORY LAPAROTOMY  01-08-12   due to endometriosis-portions of both ovaries removed   TONSILLECTOMY     TOTAL HIP ARTHROPLASTY     right. 2002   TOTAL HIP ARTHROPLASTY  01/14/2012   Procedure: TOTAL HIP ARTHROPLASTY;  Surgeon: Gearlean Alf, MD;  Location: [facility];  Service: Orthopedics;  Laterality: Left;   TOTAL KNEE ARTHROPLASTY Left 07/16/2020   Procedure: TOTAL KNEE ARTHROPLASTY;  Surgeon: Gaynelle Arabian, MD;  Location: [facility];  Service: Orthopedics;  Laterality: Left;  60mn   TOTAL SHOULDER ARTHROPLASTY Right    VENTRAL HERNIA REPAIR  01-08-12   abdominal   Family History  Problem Relation Age of Onset   Heart disease Mother    Heart attack Mother    Prostate cancer Father    Coronary artery disease  Other        FH Female 1st degree relative <60   ADD / ADHD Other    Social History   Socioeconomic History   Marital status: Married    Spouse name: Not on file   Number of children: Not on file   Years of education: Not on file   Highest education level: Not on file  Occupational History   Occupation: IFutures trader Tobacco Use   Smoking status: Never   Smokeless tobacco: Never   Tobacco comments:    only sHospital doctor  Vaping Use: Never used  Substance and Sexual Activity   Alcohol use: Yes    Alcohol/week: 0.0 standard drinks    Comment: rare social    Drug use: No   Sexual activity: Yes  Other Topics Concern   Not on file  Social History Narrative   Married for last 42 years.Lives with husband.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 [place]   Social Determinants of Health   Financial  Resource Strain: Low Risk    Difficulty of Paying Living Expenses: Not hard at all  Food Insecurity: No Food Insecurity   Worried About Charity fundraiser in the Last Year: Never true   Ran Out of Food in the Last Year: Never true  Transportation Needs: No Transportation Needs   Lack of Transportation (Medical): No   Lack of Transportation (Non-Medical): No  Physical Activity: Sufficiently Active   Days of Exercise per Week: 5 days   Minutes of Exercise per Session: 30 min  Stress: No Stress Concern Present   Feeling of Stress : Not at all  Social Connections: Socially Integrated   Frequency of Communication with Friends and Family: More than three times a week   Frequency of Social Gatherings with Friends and Family: More than three times a week   Attends Religious Services: 1 to 4 times per year   Active Member of [employer] or Organizations: Yes   Attends Archivist Meetings: 1 to 4 times per year   Marital Status: Married    Tobacco Counseling Counseling given: Not Answered Tobacco comments: only social   Clinical Intake:  Pre-visit  preparation completed: Yes  Pain : No/denies pain     Nutritional Risks: None Diabetes: No  How often do you need to have someone help you when you read instructions, pamphlets, or other written materials from your doctor or pharmacy?: 1 - Never What is the last grade level you completed in school?: Post Graduate  Diabetic? No  Interpreter Needed?: No  Information entered by :: Lisette Abu, LPN   Activities of Daily Living In your present state of health, do you have any difficulty performing the following activities: 01/23/2021 07/16/2020  Hearing? N N  Vision? N N  Comment - -  Difficulty concentrating or making decisions? N N  Walking or climbing stairs? N N  Dressing or bathing? N N  Doing errands, shopping? N N  Preparing Food and eating ? N -  Using the Toilet? N -  In the past six months, have you accidently leaked urine? N -  Do you have problems with loss of bowel control? N -  Managing your Medications? N -  Managing your Finances? N -  Housekeeping or managing your Housekeeping? N -  Some recent data might be hidden    Patient Care Team: Plotnikov, Evie Lacks, MD as PCP - General Tamala Julian Lynnell Dike, MD as PCP - Cardiology (Cardiology) Pauline Good, NP (Psychiatry) Gaynelle Arabian, MD as Consulting Physician (Orthopedic Surgery) Doree Albee, MD (Inactive) as Consulting Physician (Internal Medicine) Cottle, Billey Co., MD as Attending Physician (Psychiatry) Gavin Pound, MD as Consulting Physician (Rheumatology) Marica Otter, [place] as Consulting Physician (Optometry)  Indicate any recent Medical Services you may have received from other than [facility] providers in the past year (date may be approximate).     Assessment:   This is a routine wellness examination for Glora.  Hearing/Vision screen Hearing Screening - Comments:: Patient denied any hearing difficulty.   No hearing aids.  Vision Screening - Comments:: Patient wears corrective  glasses/contacts.  Eye exam done annually by: Marica Otter, OD.  Dietary issues and exercise activities discussed: Current Exercise Habits: Home exercise routine, Type of exercise: Other - see comments (bike), Time (Minutes): 30, Frequency (Times/Week): 5, Weekly Exercise (Minutes/Week): 150, Intensity: Moderate, Exercise limited by: orthopedic condition(s)   Goals Addressed             This Visit's Progress  Patient Stated       My goal is to strengthening my spine and avoid spinal surgery.      Depression Screen PHQ 2/9 Scores 01/23/2021 08/27/2020 05/08/2020 01/05/2015  PHQ - 2 Score 0 0 0 0  PHQ- 9 Score - 0 1 -    Fall Risk Fall Risk  01/23/2021 08/27/2020 06/20/2020 04/05/2018 01/05/2015  Falls in the past year? 0 0 0 0 No  Number falls in past yr: 0 - 0 - -  Injury with Fall? 0 - 0 - -  Risk for fall due to : No Fall Risks - Impaired balance/gait - -  Follow up Falls evaluation completed - Falls evaluation completed Falls evaluation completed -    FALL RISK PREVENTION PERTAINING TO THE HOME:  Any stairs in or around the home? Yes  If so, are there any without handrails? No  Home free of loose throw rugs in walkways, pet beds, electrical cords, etc? Yes  Adequate lighting in your home to reduce risk of falls? Yes   ASSISTIVE DEVICES UTILIZED TO PREVENT FALLS:  Life alert? No  Use of a cane, walker or w/c? No  Grab bars in the bathroom? No  Shower chair or bench in shower? No  Elevated toilet seat or a handicapped toilet? No   TIMED UP AND GO:  Was the test performed? No .  Length of time to ambulate 10 feet: n/a sec.   Gait steady and fast without use of assistive device  Cognitive Function: Normal cognitive status assessed by direct observation by this Nurse Health Advisor. No abnormalities found.          Immunizations Immunization History  Administered Date(s) Administered   Hep A / Hep B 03/07/2011, 04/04/2011, 10/02/2011   Influenza Inj Mdck Quad Pf  02/23/2017   Influenza Inj Mdck Quad With Preservative 02/10/2018   Influenza Split 03/24/2011, 04/06/2012   Influenza, High Dose Seasonal PF 04/20/2015, 04/16/2016, 02/01/2019   MMR 04/15/2011   Meningococcal Polysaccharide 04/04/2011   Moderna Sars-Covid-2 Vaccination 05/13/2019, 06/13/2019   Pneumococcal Conjugate-13 04/16/2016   Pneumococcal Polysaccharide-23 03/24/2011, 04/05/2018   Tdap 03/07/2011, 07/04/2020   Zoster Recombinat (Shingrix) 01/06/2017, 03/10/2017    TDAP status: Up to date  Flu Vaccine status: Due, Education has been provided regarding the importance of this vaccine. Advised may receive this vaccine at local pharmacy or Health Dept. Aware to provide a copy of the vaccination record if obtained from local pharmacy or Health Dept. Verbalized acceptance and understanding.  Pneumococcal vaccine status: Up to date  Covid-19 vaccine status: Completed vaccines  Qualifies for Shingles Vaccine? Yes   Zostavax completed No   Shingrix Completed?: Yes  Screening Tests Health Maintenance  Topic Date Due   Hepatitis C Screening  Never done   COLONOSCOPY (Pts 45-80yr Insurance coverage will need to be confirmed)  08/19/2010   MAMMOGRAM  08/31/2016   COVID-19 Vaccine (3 - Moderna risk series) 07/11/2019   INFLUENZA VACCINE  11/26/2020   TETANUS/TDAP  07/05/2030   DEXA SCAN  Completed   Zoster Vaccines- Shingrix  Completed   HPV VACCINES  Aged Out    Health Maintenance  Health Maintenance Due  Topic Date Due   Hepatitis C Screening  Never done   COLONOSCOPY (Pts 45-461yrInsurance coverage will need to be confirmed)  08/19/2010   MAMMOGRAM  08/31/2016   COVID-19 Vaccine (3 - Moderna risk series) 07/11/2019   INFLUENZA VACCINE  11/26/2020    Colorectal cancer screening: Type of screening:  Colonoscopy. Completed 03/2015 (per patient). Repeat every 10 years  Mammogram status: Completed 03/29/2020. Repeat every year  Bone Density status: Completed 11/23/2018.  Results reflect: Bone density results: OSTEOPENIA. Repeat every 2-3 years.  Lung Cancer Screening: (Low Dose CT Chest recommended if Age 55-80 years, 30 pack-year currently smoking OR have quit w/in 15years.) does not qualify.   Lung Cancer Screening Referral: no  Additional Screening:  Hepatitis C Screening: does qualify; Completed: no  Vision Screening: Recommended annual ophthalmology exams for early detection of glaucoma and other disorders of the eye. Is the patient up to date with their annual eye exam?  Yes  Who is the provider or what is the name of the office in which the patient attends annual eye exams? Marica Otter, OD If pt is not established with a provider, would they like to be referred to a provider to establish care? No .   Dental Screening: Recommended annual dental exams for proper oral hygiene  Community Resource Referral / Chronic Care Management: CRR required this visit?  No   CCM required this visit?  No      Plan:     I have personally reviewed and noted the following in the patient's chart:   Medical and social history Use of alcohol, tobacco or illicit drugs  Current medications and supplements including opioid prescriptions. Patient is not currently taking opioid prescriptions. Functional ability and status Nutritional status Physical activity Advanced directives List of other physicians Hospitalizations, surgeries, and ER visits in previous 12 months Vitals Screenings to include cognitive, depression, and falls Referrals and appointments  In addition, I have reviewed and discussed with patient certain preventive protocols, quality metrics, and best practice recommendations. A written personalized care plan for preventive services as well as general preventive health recommendations were provided to patient.     Sheral Flow, LPN   6/31/4970   Nurse Notes:  Patient is cogitatively intact. There were no vitals filed for this  visit. There is no height or weight on file to calculate BMI. Patient stated that she has no issues with gait or balance; does not use any assistive devices. Hearing Screening - Comments:: Patient denied any hearing difficulty.   No hearing aids.  Vision Screening - Comments:: Patient wears corrective glasses/contacts.  Eye exam done annually by: Marica Otter, OD.  Medical screening examination/treatment/procedure(s) were performed by non-physician practitioner and as supervising physician I was immediately available for consultation/collaboration.  I agree with above. Lew Dawes, MD

## 2021-01-24 NOTE — Progress Notes (Signed)
If her anxiety and depression are better on imipramine 75 mg daily then no change in dose. Otherwise her blood level is low, if she is tolerating the imipramine then increase to 100 mg daily for 7 days then increase to 150 mg daily.  If she does so then, RX imipramine 50 mg tablets, 3 nightly

## 2021-01-25 NOTE — Progress Notes (Signed)
LVM to return call.

## 2021-02-21 ENCOUNTER — Ambulatory Visit (INDEPENDENT_AMBULATORY_CARE_PROVIDER_SITE_OTHER): Payer: Medicare Other

## 2021-02-21 ENCOUNTER — Encounter: Payer: Self-pay | Admitting: Internal Medicine

## 2021-02-21 ENCOUNTER — Other Ambulatory Visit: Payer: Self-pay

## 2021-02-21 ENCOUNTER — Ambulatory Visit (INDEPENDENT_AMBULATORY_CARE_PROVIDER_SITE_OTHER): Payer: Medicare Other | Admitting: Internal Medicine

## 2021-02-21 VITALS — BP 122/76 | HR 83 | Ht 59.0 in | Wt 110.8 lb

## 2021-02-21 DIAGNOSIS — R1084 Generalized abdominal pain: Secondary | ICD-10-CM

## 2021-02-21 DIAGNOSIS — R112 Nausea with vomiting, unspecified: Secondary | ICD-10-CM | POA: Diagnosis not present

## 2021-02-21 DIAGNOSIS — F411 Generalized anxiety disorder: Secondary | ICD-10-CM

## 2021-02-21 DIAGNOSIS — I2510 Atherosclerotic heart disease of native coronary artery without angina pectoris: Secondary | ICD-10-CM

## 2021-02-21 DIAGNOSIS — R110 Nausea: Secondary | ICD-10-CM | POA: Diagnosis not present

## 2021-02-21 DIAGNOSIS — R109 Unspecified abdominal pain: Secondary | ICD-10-CM | POA: Diagnosis not present

## 2021-02-21 MED ORDER — ONDANSETRON HCL 4 MG PO TABS: 4.0000 mg | ORAL_TABLET | Freq: Three times a day (TID) | ORAL | 1 refills | Status: DC | PRN

## 2021-02-21 MED ORDER — ALPRAZOLAM 1 MG PO TABS: 0.5000 mg | ORAL_TABLET | Freq: Three times a day (TID) | ORAL | 1 refills | Status: DC | PRN

## 2021-02-21 MED ORDER — LACTULOSE 10 GM/15ML PO SOLN
30.0000 g | Freq: Two times a day (BID) | ORAL | 1 refills | Status: DC | PRN
Start: 2021-02-21 — End: 2021-02-26

## 2021-02-21 NOTE — Progress Notes (Signed)
Patient ID: Courtney [name], female   DOB: 08-Sep-1950, 70 y.o.   MRN: [id number]        Chief Complaint: follow up anxiety with flare, nausea, constipation       HPI:  Courtney [name] is a 70 y.o. female here with c/o numerous increased social stressors the primary of which is a downsizing event sort of just done by her husband but for which she gets to do most of the work in packing, moving, with having to tolerate persistent right shoulder pain for which she needs surgury but is puttting off, and already had 2 hip and left knee TKR; has worsening constipation and generalized abd pains for 3-4 days as well associated with dry heaves, reduced appetite, several lbs wt loss, despite fleets enema and dulcolox supplository.  Denies worsening depressive symptoms, suicidal ideation, or panic; Denies worsening reflux, dysphagia, diarrhea or blood.  Has been taking tramadol for pain for some time and has not caused constipation in the past.  Pt denies chest pain, increased sob or doe, wheezing, orthopnea, PND, increased LE swelling, palpitations, dizziness or syncope.   Pt denies fever, night sweats, loss of appetite, or other constitutional symptoms  Asks for abd films r/o obstruction.        Wt Readings from Last 3 Encounters:  02/25/21 110 lb (49.9 kg)  02/21/21 110 lb 12.8 oz (50.3 kg)  09/04/20 116 lb (52.6 kg)   BP Readings from Last 3 Encounters:  02/25/21 138/88  02/21/21 122/76  09/04/20 138/72         Past Medical History:  Diagnosis Date   ADD (attention deficit disorder)    Anxiety    Celiac disease    possible vs IBS   Complication of anesthesia    Coronary artery disease    Depression    no bipolar per Dr. [name]   GI problem    Olevia Perches   Gluten intolerance    Hyperlipidemia    IBS (irritable bowel syndrome)    Osteoarthritis    Osteoporosis    PONV (postoperative nausea and vomiting)    Rheumatoid arthritis ([place])    Seasonal allergies 01-08-12   hx. of multiple  bronchitis related to this.   Shoulder pain, right    Past Surgical History:  Procedure Laterality Date   EXPLORATORY LAPAROTOMY  01-08-12   due to endometriosis-portions of both ovaries removed   TONSILLECTOMY     TOTAL HIP ARTHROPLASTY     right. 2002   TOTAL HIP ARTHROPLASTY  01/14/2012   Procedure: TOTAL HIP ARTHROPLASTY;  Surgeon: Gearlean Alf, MD;  Location: [facility];  Service: Orthopedics;  Laterality: Left;   TOTAL KNEE ARTHROPLASTY Left 07/16/2020   Procedure: TOTAL KNEE ARTHROPLASTY;  Surgeon: Gaynelle Arabian, MD;  Location: [facility];  Service: Orthopedics;  Laterality: Left;  60min   TOTAL SHOULDER ARTHROPLASTY Right    VENTRAL HERNIA REPAIR  01-08-12   abdominal    reports that she has never smoked. She has never used smokeless tobacco. She reports current alcohol use. She reports that she does not use drugs. family history includes ADD / ADHD in an other family member; Coronary artery disease in an other family member; Heart attack in her mother; Heart disease in her mother; Prostate cancer in her father. Allergies  Allergen Reactions   Clarithromycin Nausea Only   Covid-19 (Mrna) Vaccine Other (See Comments)    Acute Vasculitis; excessive bleeding    Other Other (See Comments)   Bactrim [Sulfamethoxazole-Trimethoprim] Nausea And  Vomiting   Morphine Nausea And Vomiting    Other reaction(s): Nausea/Vomiting   Morphine And Related Nausea And Vomiting   Azithromycin Nausea And Vomiting and Nausea Only   Ceclor [Cefaclor] Nausea And Vomiting    Can take Augmentin ok   Codeine Nausea And Vomiting   Cymbalta [Duloxetine Hcl]    Erythromycin Nausea And Vomiting    Other reaction(s): Nausea/Vomiting   Erythromycin Base Nausea And Vomiting   Levofloxacin Nausea Only    REACTION: nausea   Loratadine     REACTION: bruises   Nitrofurantoin Nausea Only   Oxycodone Other (See Comments)   Penicillins Nausea And Vomiting   Propoxyphene Nausea Only    dizzy   Propoxyphene  N-Acetaminophen Nausea Only    dizzy   Current Outpatient Medications on File Prior to Visit  Medication Sig Dispense Refill   amphetamine-dextroamphetamine (ADDERALL) 10 MG tablet Take 1 tablet (10 mg total) by mouth 2 (two) times daily with a meal. 60 tablet 0   Calcium Carb-Cholecalciferol (CALCIUM 600+D3 PO) Take 1,200 tablets by mouth daily.      estradiol (ESTRACE) 1 MG tablet Take 1.5 tablets (1.5 mg total) by mouth daily. 135 tablet 1   imipramine (TOFRANIL) 25 MG tablet 1 at night for 5 night, then 2 at night for 5 nights, then 3 at night (Patient taking differently: 3 and 1/2 tablets daily) 90 tablet 1   meloxicam (MOBIC) 15 MG tablet Take 15 mg by mouth daily.     NP THYROID 60 MG tablet Take 1 tablet (60 mg total) by mouth daily before breakfast. 90 tablet 1   omega-3 acid ethyl esters (LOVAZA) 1 g capsule Take 2 capsules (2 g total) by mouth 2 (two) times daily. 120 capsule 11   pravastatin (PRAVACHOL) 20 MG tablet Take one tablet by mouth 4 days per week 48 tablet 3   progesterone (PROMETRIUM) 200 MG capsule Take 2 capsules (400 mg total) by mouth at bedtime. 180 capsule 1   propranolol (INDERAL) 10 MG tablet TAKE 1 TO 3 TABLETS BY MOUTH TWICE DAILY AS NEEDED FOR ANXIETY 540 tablet 0   Testosterone 1.62 % GEL Place 5 mg onto the skin daily. 30 g 3   traMADol (ULTRAM) 50 MG tablet TAKE 1/2 TO 1 TABLET(25 TO 50 MG) BY MOUTH BACK EVERY 8 HOURS AS NEEDED FOR SEVERE PAIN 90 tablet 0   vitamin E 400 UNIT capsule Take 800 Units by mouth daily.      methocarbamol (ROBAXIN) 500 MG tablet Take 1 tablet (500 mg total) by mouth every 6 (six) hours as needed for muscle spasms. 40 tablet 0   nitroGLYCERIN (NITROSTAT) 0.4 MG SL tablet Place 1 tablet (0.4 mg total) under the tongue every 5 (five) minutes as needed for chest pain. 25 tablet 2   pregabalin (LYRICA) 75 MG capsule Take 75 mg by mouth 2 (two) times daily. (Patient not taking: No sig reported)     No current facility-administered  medications on file prior to visit.        ROS:  All others reviewed and negative.  Objective        PE:  BP 122/76 (BP Location: Right Arm, Patient Position: Sitting, Cuff Size: Normal)   Pulse 83   Ht 4\' 11"  (1.499 m)   Wt 110 lb 12.8 oz (50.3 kg)   LMP 01/08/1999   SpO2 98%   BMI 22.38 kg/m  Constitutional: Pt appears in NAD               HENT: Head: NCAT.                Right Ear: External ear normal.                 Left Ear: External ear normal.                Eyes: . Pupils are equal, round, and reactive to light. Conjunctivae and EOM are normal               Nose: without d/c or deformity               Neck: Neck supple. Gross normal ROM               Cardiovascular: Normal rate and regular rhythm.                 Pulmonary/Chest: Effort normal and breath sounds without rales or wheezing.                Abd:  Soft, mild diffuse tender,, ND, + BS, no organomegaly               Neurological: Pt is alert. At baseline orientation, motor grossly intact               Skin: Skin is warm. No rashes, no other new lesions, LE edema - none               Psychiatric: Pt behavior is normal without agitation, 2+ nervous  Micro: none  Cardiac tracings I have personally interpreted today:  none  Pertinent Radiological findings (summarize): none   Lab Results  Component Value Date   WBC 5.3 02/25/2021   HGB 13.5 02/25/2021   HCT 39.6 02/25/2021   PLT 365 02/25/2021   GLUCOSE 94 02/25/2021   CHOL 203 (H) 12/19/2020   TRIG 93 12/19/2020   HDL 98 12/19/2020   LDLDIRECT 120.7 08/04/2012   LDLCALC 89 12/19/2020   ALT 12 02/25/2021   AST 16 02/25/2021   NA 127 (L) 02/25/2021   K 4.4 02/25/2021   CL 93 (L) 02/25/2021   CREATININE 0.69 02/25/2021   BUN 16 02/25/2021   CO2 27 02/25/2021   TSH 0.35 (L) 08/27/2020   INR 1.0 07/06/2020   HGBA1C 5.4 02/11/2018   Assessment/Plan:  Courtney [name] is a 70 y.o. White or Caucasian [1] female with  has a past  medical history of ADD (attention deficit disorder), Anxiety, Celiac disease, Complication of anesthesia, Coronary artery disease, Depression, GI problem, Gluten intolerance, Hyperlipidemia, IBS (irritable bowel syndrome), Osteoarthritis, Osteoporosis, PONV (postoperative nausea and vomiting), Rheumatoid arthritis ([place]), Seasonal allergies (01-08-12), and Shoulder pain, right.  Anxiety state With situational worsening it seems due to social stressors increased, for xanax tid prn, cont counseling   Nausea & vomiting I suspect related to constipation, for abd films, zofran prn,  to f/u any worsening symptoms or concerns  Generalized abdominal pain Pt afeb, exam with mild tender but non distended, for lactulose prn if films neg for obstruction, and cbc with labs,  to f/u any worsening symptoms or concerns  Followup: Return if symptoms worsen or fail to improve.  Cathlean Cower, MD 02/27/2021 9:21 AM [facility]

## 2021-02-21 NOTE — Patient Instructions (Signed)
Please take all new medication as prescribed - the nausea medication, the lactulose, and the xanax as needed  Please continue all other medications as before, and refills have been done if requested.  Please have the pharmacy call with any other refills you may need  Please keep your appointments with your specialists as you may have planned  Please go to the XRAY Department in the first floor for the x-ray testing  You will be contacted by phone if any changes need to be made immediately.  Otherwise, you will receive a letter about your results with an explanation, but please check with MyChart first.  Please remember to sign up for MyChart if you have not done so, as this will be important to you in the future with finding out test results, communicating by private email, and scheduling acute appointments online when needed.

## 2021-02-25 ENCOUNTER — Encounter (HOSPITAL_BASED_OUTPATIENT_CLINIC_OR_DEPARTMENT_OTHER): Payer: Self-pay | Admitting: Emergency Medicine

## 2021-02-25 ENCOUNTER — Emergency Department (HOSPITAL_BASED_OUTPATIENT_CLINIC_OR_DEPARTMENT_OTHER)
Admission: EM | Admit: 2021-02-25 | Discharge: 2021-02-25 | Disposition: A | Discharge status: Home / Self Care | Payer: Medicare Other | Attending: Emergency Medicine | Admitting: Emergency Medicine

## 2021-02-25 ENCOUNTER — Other Ambulatory Visit: Payer: Self-pay

## 2021-02-25 ENCOUNTER — Emergency Department (HOSPITAL_BASED_OUTPATIENT_CLINIC_OR_DEPARTMENT_OTHER): Payer: Medicare Other

## 2021-02-25 DIAGNOSIS — I2510 Atherosclerotic heart disease of native coronary artery without angina pectoris: Secondary | ICD-10-CM | POA: Insufficient documentation

## 2021-02-25 DIAGNOSIS — Z96652 Presence of left artificial knee joint: Secondary | ICD-10-CM | POA: Diagnosis not present

## 2021-02-25 DIAGNOSIS — E871 Hypo-osmolality and hyponatremia: Secondary | ICD-10-CM | POA: Diagnosis not present

## 2021-02-25 DIAGNOSIS — Z96643 Presence of artificial hip joint, bilateral: Secondary | ICD-10-CM | POA: Insufficient documentation

## 2021-02-25 DIAGNOSIS — E039 Hypothyroidism, unspecified: Secondary | ICD-10-CM | POA: Diagnosis not present

## 2021-02-25 DIAGNOSIS — Z96611 Presence of right artificial shoulder joint: Secondary | ICD-10-CM | POA: Diagnosis not present

## 2021-02-25 DIAGNOSIS — R112 Nausea with vomiting, unspecified: Secondary | ICD-10-CM | POA: Diagnosis not present

## 2021-02-25 DIAGNOSIS — R1084 Generalized abdominal pain: Secondary | ICD-10-CM | POA: Diagnosis not present

## 2021-02-25 DIAGNOSIS — R109 Unspecified abdominal pain: Secondary | ICD-10-CM | POA: Diagnosis not present

## 2021-02-25 DIAGNOSIS — R0789 Other chest pain: Secondary | ICD-10-CM | POA: Diagnosis not present

## 2021-02-25 DIAGNOSIS — K5900 Constipation, unspecified: Secondary | ICD-10-CM

## 2021-02-25 LAB — URINALYSIS, ROUTINE W REFLEX MICROSCOPIC
Bilirubin Urine: NEGATIVE
Glucose, UA: NEGATIVE mg/dL
Hgb urine dipstick: NEGATIVE
Ketones, ur: NEGATIVE mg/dL
Leukocytes,Ua: NEGATIVE
Nitrite: NEGATIVE
Protein, ur: NEGATIVE mg/dL
Specific Gravity, Urine: 1.017 (ref 1.005–1.030)
pH: 6.5 (ref 5.0–8.0)

## 2021-02-25 LAB — CBC
HCT: 39.6 % (ref 36.0–46.0)
Hemoglobin: 13.5 g/dL (ref 12.0–15.0)
MCH: 30.5 pg (ref 26.0–34.0)
MCHC: 34.1 g/dL (ref 30.0–36.0)
MCV: 89.6 fL (ref 80.0–100.0)
Platelets: 365 10*3/uL (ref 150–400)
RBC: 4.42 MIL/uL (ref 3.87–5.11)
RDW: 11.3 % — ABNORMAL LOW (ref 11.5–15.5)
WBC: 5.3 10*3/uL (ref 4.0–10.5)
nRBC: 0 % (ref 0.0–0.2)

## 2021-02-25 LAB — COMPREHENSIVE METABOLIC PANEL
ALT: 12 U/L (ref 0–44)
AST: 16 U/L (ref 15–41)
Albumin: 4.1 g/dL (ref 3.5–5.0)
Alkaline Phosphatase: 47 U/L (ref 38–126)
Anion gap: 7 (ref 5–15)
BUN: 16 mg/dL (ref 8–23)
CO2: 27 mmol/L (ref 22–32)
Calcium: 9.5 mg/dL (ref 8.9–10.3)
Chloride: 93 mmol/L — ABNORMAL LOW (ref 98–111)
Creatinine, Ser: 0.69 mg/dL (ref 0.44–1.00)
GFR, Estimated: >60 mL/min (ref >60)
Glucose, Bld: 94 mg/dL (ref 70–99)
Potassium: 4.4 mmol/L (ref 3.5–5.1)
Sodium: 127 mmol/L — ABNORMAL LOW (ref 135–145)
Total Bilirubin: 0.3 mg/dL (ref 0.3–1.2)
Total Protein: 6.8 g/dL (ref 6.5–8.1)

## 2021-02-25 LAB — LIPASE, BLOOD: Lipase: 49 U/L (ref 11–51)

## 2021-02-25 MED ORDER — ONDANSETRON HCL 4 MG/2ML IJ SOLN
4.0000 mg | Freq: Once | INTRAMUSCULAR | Status: AC
  Administered 2021-02-25: 4 mg via INTRAVENOUS
  Filled 2021-02-25: qty 2

## 2021-02-25 MED ORDER — IOHEXOL 300 MG/ML  SOLN
100.0000 mL | Freq: Once | INTRAMUSCULAR | Status: AC | PRN
  Administered 2021-02-25: 75 mL via INTRAVENOUS

## 2021-02-25 MED ORDER — PANTOPRAZOLE SODIUM 40 MG PO TBEC: 40.0000 mg | DELAYED_RELEASE_TABLET | Freq: Every day | ORAL | 0 refills | Status: DC

## 2021-02-25 MED ORDER — SODIUM CHLORIDE 0.9 % IV BOLUS
1000.0000 mL | Freq: Once | INTRAVENOUS | Status: AC
  Administered 2021-02-25: 1000 mL via INTRAVENOUS

## 2021-02-25 MED ORDER — PANTOPRAZOLE SODIUM 40 MG PO TBEC
40.0000 mg | DELAYED_RELEASE_TABLET | Freq: Once | ORAL | Status: AC
  Administered 2021-02-25: 40 mg via ORAL
  Filled 2021-02-25: qty 1

## 2021-02-25 NOTE — ED Triage Notes (Signed)
Pt via pov from home with abdominal pain  (LUQ)x 2 weeks, accompanied by constipation. She also reports a "lump in my throat" and that when she eats, she becomes very nauseous, which has led her to cut back on her food, so she feels no energy. She did see pcp, who gave her a laxative with no follow up or tests. Pt alert & oriented, nad noted.

## 2021-02-25 NOTE — ED Provider Notes (Signed)
[facility] EMERGENCY DEPT Provider Note   CSN: [id number] Arrival date & time: 02/25/21  1522     History Chief Complaint  Patient presents with   Abdominal Pain    Courtney [name] is a 70 y.o. female.  She is here with a complaint of 2 weeks of upper abdominal pain.  Associated with nausea especially after eating.  She feels she is not had normal bowel movements although they have not been hard or associated with any blood.  No fevers or chills.  She has had some vomiting or dry heaving mostly associated after eating especially if she eats quickly.  She saw her the covering doctor for her PCP who gave her some lactulose which caused her to have more abdominal cramps but otherwise no improvement.  The history is provided by the patient.  Abdominal Pain Pain location:  RUQ, LUQ and epigastric Pain quality: aching and cramping   Pain radiates to:  Does not radiate Pain severity:  Moderate Onset quality:  Sudden Duration:  2 weeks Timing:  Intermittent Progression:  Unchanged Chronicity:  New Context: not sick contacts, not suspicious food intake and not trauma   Relieved by:  Nothing Worsened by:  Eating Ineffective treatments:  Bowel activity and OTC medications Associated symptoms: constipation, nausea and vomiting   Associated symptoms: no chest pain, no cough, no diarrhea, no dysuria, no fever, no shortness of breath and no sore throat   Risk factors: no recent hospitalization       Past Medical History:  Diagnosis Date   ADD (attention deficit disorder)    Anxiety    Celiac disease    possible vs IBS   Complication of anesthesia    Coronary artery disease    Depression    no bipolar per Dr. [name]   GI problem    Olevia Perches   Gluten intolerance    Hyperlipidemia    IBS (irritable bowel syndrome)    Osteoarthritis    Osteoporosis    PONV (postoperative nausea and vomiting)    Rheumatoid arthritis ([place])    Seasonal allergies 01-08-12   hx.  of multiple bronchitis related to this.   Shoulder pain, right     Patient Active Problem List   Diagnosis Date Noted   Primary osteoarthritis of left knee 07/16/2020   Vasculitis ([place]) 07/04/2020   RA (rheumatoid arthritis) ([place]) 07/04/2020   Preop exam for internal medicine 06/03/2020   Menopause 05/31/2020   Allergic contact dermatitis 05/28/2020   Degenerative disc disease, cervical 06/15/2019   Acquired leg length discrepancy 06/15/2019   Leg pain 06/14/2019   Toe ulcer ([place]) 06/14/2019   Mixed hyperlipidemia 02/04/2019   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   Spinal stenosis, lumbar region, 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   Sciatica of right side associated with disorder of lumbar spine 02/26/2017   CAD in native artery 12/23/2016   Abdominal pain, epigastric 05/09/2015   Dry skin dermatitis 08/04/2012   Constipation - functional 08/04/2012   History of hypothyroidism 08/04/2012   Otitis media, serous 12/25/2010   OSTEOARTHRITIS, HIPS, BILATERAL 06/20/2010   ANXIETY 05/03/2009   TOBACCO USE, QUIT 05/03/2009   ONYCHOMYCOSIS 06/28/2008   Actinic keratosis 06/28/2008   Bilateral carotid bruits 06/28/2008   Attention deficit disorder 05/17/2007   Irritable bowel syndrome 05/17/2007   Dyslipidemia 02/28/2007   Depression 02/28/2007   OA (osteoarthritis) of knee 02/28/2007    Past Surgical History:  Procedure Laterality Date   EXPLORATORY LAPAROTOMY  01-08-12   due to  endometriosis-portions of both ovaries removed   TONSILLECTOMY     TOTAL HIP ARTHROPLASTY     right. 2002   TOTAL HIP ARTHROPLASTY  01/14/2012   Procedure: TOTAL HIP ARTHROPLASTY;  Surgeon: Gearlean Alf, MD;  Location: [facility];  Service: Orthopedics;  Laterality: Left;   TOTAL KNEE ARTHROPLASTY Left 07/16/2020   Procedure: TOTAL KNEE ARTHROPLASTY;  Surgeon: Gaynelle Arabian, MD;  Location: [facility];  Service: Orthopedics;  Laterality: Left;  78min   TOTAL SHOULDER ARTHROPLASTY Right     VENTRAL HERNIA REPAIR  01-08-12   abdominal     OB History   No obstetric history on file.     Family History  Problem Relation Age of Onset   Heart disease Mother    Heart attack Mother    Prostate cancer Father    Coronary artery disease Other        FH Female 1st degree relative <60   ADD / ADHD Other     Social History   Tobacco Use   Smoking status: Never   Smokeless tobacco: Never   Tobacco comments:    only social  Vaping Use   Vaping Use: Never used  Substance Use Topics   Alcohol use: Yes    Alcohol/week: 0.0 standard drinks    Comment: rare social    Drug use: No    Home Medications Prior to Admission medications   Medication Sig Start Date End Date Taking? Authorizing Provider  ALPRAZolam Duanne Moron) 1 MG tablet Take 0.5-1 tablets (0.5-1 mg total) by mouth 3 (three) times daily as needed. 02/21/21   Biagio Borg, MD  amphetamine-dextroamphetamine (ADDERALL) 10 MG tablet Take 1 tablet (10 mg total) by mouth 2 (two) times daily with a meal. 10/02/20   Cottle, Billey Co., MD  Calcium Carb-Cholecalciferol (CALCIUM 600+D3 PO) Take 1,200 tablets by mouth daily.     [provider]  estradiol (ESTRACE) 1 MG tablet Take 1.5 tablets (1.5 mg total) by mouth daily. 11/14/20   Doree Albee, MD  imipramine (TOFRANIL) 25 MG tablet 1 at night for 5 night, then 2 at night for 5 nights, then 3 at night 01/03/21   Cottle, Billey Co., MD  lactulose (CHRONULAC) 10 GM/15ML solution Take 45 mLs (30 g total) by mouth 2 (two) times daily as needed for moderate constipation. 02/21/21   Biagio Borg, MD  meloxicam (MOBIC) 15 MG tablet Take 15 mg by mouth daily.    [provider]  methocarbamol (ROBAXIN) 500 MG tablet Take 1 tablet (500 mg total) by mouth every 6 (six) hours as needed for muscle spasms. Patient not taking: No sig reported 07/17/20   Edmisten, Kristie L, PA  nitroGLYCERIN (NITROSTAT) 0.4 MG SL tablet Place 1 tablet (0.4 mg total) under the tongue every  5 (five) minutes as needed for chest pain. 06/14/18 06/22/19  Tommie Raymond, NP  NP THYROID 60 MG tablet Take 1 tablet (60 mg total) by mouth daily before breakfast. 11/14/20   Hurshel Party C, MD  omega-3 acid ethyl esters (LOVAZA) 1 g capsule Take 2 capsules (2 g total) by mouth 2 (two) times daily. 09/14/19   Plotnikov, Evie Lacks, MD  ondansetron (ZOFRAN) 4 MG tablet Take 1 tablet (4 mg total) by mouth every 8 (eight) hours as needed for nausea or vomiting. 02/21/21   Biagio Borg, MD  pravastatin (PRAVACHOL) 20 MG tablet Take one tablet by mouth 4 days per week 10/22/20   Daneen Schick  W, MD  pregabalin (LYRICA) 75 MG capsule Take 75 mg by mouth 2 (two) times daily. Patient not taking: No sig reported 04/30/20   [provider]  progesterone (PROMETRIUM) 200 MG capsule Take 2 capsules (400 mg total) by mouth at bedtime. 11/14/20   Doree Albee, MD  propranolol (INDERAL) 10 MG tablet TAKE 1 TO 3 TABLETS BY MOUTH TWICE DAILY AS NEEDED FOR ANXIETY 12/19/20   Cottle, Billey Co., MD  Testosterone 1.62 % GEL Place 5 mg onto the skin daily. 10/03/20   Hurshel Party C, MD  traMADol (ULTRAM) 50 MG tablet TAKE 1/2 TO 1 TABLET(25 TO 50 MG) BY MOUTH BACK EVERY 8 HOURS AS NEEDED FOR SEVERE PAIN 07/11/20   Binnie Rail, MD  vitamin E 400 UNIT capsule Take 800 Units by mouth daily.     [provider]    Allergies    Clarithromycin, Covid-19 (mrna) vaccine, Other, Bactrim [sulfamethoxazole-trimethoprim], Morphine, Morphine and related, Azithromycin, Ceclor [cefaclor], Codeine, Cymbalta [duloxetine hcl], Erythromycin, Erythromycin base, Levofloxacin, Loratadine, Nitrofurantoin, Oxycodone, Penicillins, Propoxyphene, and Propoxyphene n-acetaminophen  Review of Systems   Review of Systems  Constitutional:  Negative for fever.  HENT:  Negative for sore throat.   Eyes:  Negative for visual disturbance.  Respiratory:  Negative for cough and shortness of breath.   Cardiovascular:  Negative  for chest pain.  Gastrointestinal:  Positive for abdominal pain, constipation, nausea and vomiting. Negative for diarrhea.  Genitourinary:  Negative for dysuria.  Musculoskeletal:  Negative for back pain.  Skin:  Negative for rash.  Neurological:  Negative for headaches.   Physical Exam Updated Vital Signs BP (!) 151/87   Pulse 70   Temp 98.1 F (36.7 C) (Oral)   Resp 18   Ht 4\' 11"  (1.499 m)   Wt 49.9 kg   LMP 01/08/1999   SpO2 98%   BMI 22.22 kg/m   Physical Exam Vitals and nursing note reviewed.  Constitutional:      General: She is not in acute distress.    Appearance: Normal appearance. She is well-developed.  HENT:     Head: Normocephalic and atraumatic.  Eyes:     Conjunctiva/sclera: Conjunctivae normal.  Cardiovascular:     Rate and Rhythm: Normal rate and regular rhythm.     Heart sounds: No murmur heard. Pulmonary:     Effort: Pulmonary effort is normal. No respiratory distress.     Breath sounds: Normal breath sounds.  Abdominal:     General: There is no distension.     Palpations: Abdomen is soft. There is no mass.     Tenderness: There is no abdominal tenderness. There is no guarding or rebound.  Musculoskeletal:        General: Normal range of motion.     Cervical back: Neck supple.     Right lower leg: No edema.     Left lower leg: No edema.  Skin:    General: Skin is warm and dry.  Neurological:     General: No focal deficit present.     Mental Status: She is alert.     Sensory: No sensory deficit.     Motor: No weakness.     Gait: Gait normal.    ED Results / Procedures / Treatments   Labs (all labs ordered are listed, but only abnormal results are displayed) Labs Reviewed  COMPREHENSIVE METABOLIC PANEL - Abnormal; Notable for the following components:      Result Value   Sodium 127 (*)  Chloride 93 (*)    All other components within normal limits  CBC - Abnormal; Notable for the following components:   RDW 11.3 (*)    All other  components within normal limits  LIPASE, BLOOD  URINALYSIS, ROUTINE W REFLEX MICROSCOPIC    EKG EKG Interpretation  Date/Time:  Monday February 25 2021 16:11:54 EDT Ventricular Rate:  96 PR Interval:  150 QRS Duration: 72 QT Interval:  328 QTC Calculation: 414 R Axis:   70 Text Interpretation: Normal sinus rhythm Anterior infarct , age undetermined Abnormal ECG No significant change since prior 2/20 Confirmed by Aletta Edouard [phone]) on 02/25/2021 4:17:56 PM  Radiology CT Abdomen Pelvis W Contrast  Result Date: 02/25/2021 CLINICAL DATA:  Abdominal pain. EXAM: CT ABDOMEN AND PELVIS WITH CONTRAST TECHNIQUE: Multidetector CT imaging of the abdomen and pelvis was performed using the standard protocol following bolus administration of intravenous contrast. CONTRAST:  69mL OMNIPAQUE IOHEXOL 300 MG/ML  SOLN COMPARISON:  Abdominal ultrasound dated 05/23/2015. FINDINGS: Lower chest: The visualized lung bases are clear. There is coronary vascular calcification. No intra-abdominal free air. Evaluation of the pelvis is limited due to streak artifact caused by bilateral hip arthroplasties. A somewhat loculated fluid in the posterior pelvis is not excluded (47/2). Hepatobiliary: The liver is unremarkable. No intrahepatic biliary ductal dilatation. The gallbladder is unremarkable. Pancreas: Unremarkable. No pancreatic ductal dilatation or surrounding inflammatory changes. Spleen: Normal in size without focal abnormality. Adrenals/Urinary Tract: The adrenal glands are unremarkable. There is no hydronephrosis on either side. There is symmetric enhancement and excretion of contrast by both kidneys. There is a 15 mm left renal inferior pole cyst. 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 Stomach/Bowel: Moderate stool throughout the colon. There is no bowel obstruction. The appendix is normal. Vascular/Lymphatic: Mild aortoiliac atherosclerotic disease.  The IVC is unremarkable. No portal venous gas. There is no adenopathy. Reproductive: The uterus is retroverted and grossly unremarkable. Other: None Musculoskeletal: Severe degenerative changes of the spine. Lower lumbar anterolisthesis. Bilateral hip arthroplasties. No acute osseous pathology. IMPRESSION: 1. No acute intra-abdominal or pelvic pathology. 2. Moderate colonic stool burden. No bowel obstruction. Normal appendix. 3. Aortic Atherosclerosis (ICD10-I70.0). Electronically Signed   By: Anner Crete M.D.   On: 02/25/2021 20:15    Procedures Procedures   Medications Ordered in ED Medications  sodium chloride 0.9 % bolus 1,000 mL (0 mLs Intravenous Stopped 02/25/21 2125)  ondansetron (ZOFRAN) injection 4 mg (4 mg Intravenous Given 02/25/21 2023)  iohexol (OMNIPAQUE) 300 MG/ML solution 100 mL (75 mLs Intravenous Contrast Given 02/25/21 1955)  pantoprazole (PROTONIX) EC tablet 40 mg (40 mg Oral Given 02/25/21 2238)    ED Course  I have reviewed the triage vital signs and the nursing notes.  Pertinent labs & imaging results that were available during my care of the patient were reviewed by me and considered in my medical decision making (see chart for details).  Clinical Course as of 02/26/21 0944  Mon Feb 25, 2021  1943 Reviewed results of work-up with her.  Still feel she would benefit from the rest of the liter of fluid.  We will give referral to [facility] [MB]    Clinical Course User Index [MB] Hayden Rasmussen, MD   MDM Rules/Calculators/A&P                          This patient complains of intermittent abdominal pain, nausea, poor appetite, decreased stool  output; this involves an extensive number of treatment Options and is a complaint that carries with it a high risk of complications and Morbidity. The differential includes colitis, diverticulitis, obstruction, peptic ulcer disease, constipation, reflux, cholelithiasis, cholecystitis, appendicitis  I ordered, reviewed  and interpreted labs, which included CBC with normal white count normal hemoglobin, chemistries with low sodium low chloride, normal LFTs, negative urinalysis I ordered medication IV fluids and nausea medication oral Protonix I ordered imaging studies which included CT abdomen pelvis and I independently    visualized and interpreted imaging which showed no acute findings.  Does have moderate stool burden. Previous records obtained and reviewed in epic no recent admissions  After the interventions stated above, I reevaluated the patient and found patient to be nontoxic-appearing and in no distress.  Reviewed results of work-up with her.  We will start on PPI.  Given contact information for [facility].  Return instructions discussed.   Final Clinical Impression(s) / ED Diagnoses Final diagnoses:  Generalized abdominal pain  Nausea and vomiting, unspecified vomiting type  Constipation, unspecified constipation type  Hyponatremia    Rx / DC Orders ED Discharge Orders          Ordered    pantoprazole (PROTONIX) 40 MG tablet  Daily        02/25/21 2224             Hayden Rasmussen, MD 02/26/21 [phone]

## 2021-02-25 NOTE — Discharge Instructions (Signed)
You were seen in the emergency department for abdominal pain nausea vomiting.  You had blood work and a CAT scan of your abdomen and pelvis that did not show any obvious cause of your symptoms.  Radiology did comment upon moderate stool burden.  Please increase your fluid intake.  We are prescribing some acid medication.  Please continue your nausea medication.  Please contact [facility] for outpatient follow-up.  Return to the emergency department if any worsening or concerning symptoms.

## 2021-02-26 ENCOUNTER — Ambulatory Visit (INDEPENDENT_AMBULATORY_CARE_PROVIDER_SITE_OTHER): Payer: Medicare Other | Admitting: Psychiatry

## 2021-02-26 ENCOUNTER — Encounter: Payer: Self-pay | Admitting: Psychiatry

## 2021-02-26 DIAGNOSIS — F331 Major depressive disorder, recurrent, moderate: Secondary | ICD-10-CM | POA: Diagnosis not present

## 2021-02-26 DIAGNOSIS — I2510 Atherosclerotic heart disease of native coronary artery without angina pectoris: Secondary | ICD-10-CM | POA: Diagnosis not present

## 2021-02-26 DIAGNOSIS — F411 Generalized anxiety disorder: Secondary | ICD-10-CM

## 2021-02-26 DIAGNOSIS — F902 Attention-deficit hyperactivity disorder, combined type: Secondary | ICD-10-CM

## 2021-02-26 MED ORDER — RISPERIDONE 1 MG PO TABS: 1.0000 mg | ORAL_TABLET | Freq: Every day | ORAL | 1 refills | Status: DC

## 2021-02-26 NOTE — Progress Notes (Signed)
Courtney [name] [id number] 02/02/1951 70 y.o.  Subjective:   Patient ID:  Courtney [name] is a 70 y.o. (DOB 1950/10/09) female.  Chief Complaint:  Chief Complaint  Patient [name] with   Follow-up   Anxiety   Depression   Stress    HPI Courtney [name] to the office today for follow-up of first visit 03/01/2020 referred by a friend Courtney [name].  Was prescribed Viibryd and Vyvanse was changed to Concerta 54 mg to try to reduce the amount of dry mouth.  05/02/2020 appointment with the following noted: Less dry mouth with Concerta but didn't seem to last beyond 3-4 PM. Viibryd 20 mg daily.  Hard to get enough calories at breakfast.  But has been taking both in the morning.  Some benefit with Viibryd. Is there something I can take besides a stimulant, bc fears it is stressing her body.  Dx college exhausted adrenal glands.  It helps.  Asked questions about alternatives to stimulants.  Reduced Concerta to reduce anxiety and see if can achieve, but she thinks it's worse in the evening. Anxiety is still a struggle.  Life crisis moment.  Chronic marital dissatisfaction worse now that both are retired.  Feels like she's in a prison.  Family notices she's stressed.  Has done therapy since age 38 yo.  Has done Hindu meditation without help.  Christian faith disciplines.  Grew up caretaking.  I'm done and I need to change.  Goal is calm down enough to function through the situation. Tingling foot and wants B12 testing. Plan: Switch Viibryd to 20 mg in evening meal to get better absorption.  06/18/2020 appointment with following noted: No difference in anxiety or mood with Viibryd 20 mg daily.  She doesn't want to increase it. Questions about Viibryd and Genesight testing.  Asks about differences between Adderall and MPH.   Missed for 3 days Concerta and took Adderall.    Tired by 4-5 PM and doesn't like that.    Plan: She wants to wean off the Viibryd because she does not feel  it has been helpful and she does not want to increase the dosage.  07/03/2020 phone call patient stating she wanted to continue Viibryd 20 mg daily.  She also requested refill of Concerta 54 mg  0/96/2836 appointment with the following noted: Was told she couldn't take Viibryd with one of the pain meds and had TKR.  So stopped it.  Couldn't tolerate pain meds except tramadol.  Had more pain than expected.   Stopped Viibryd about 07/15/20.  She had taken tramadol with Viibryd in the past without a problem.   Noticing more pain after surgery in non-surgical places. Seems to be sensitive to getting lightheaded and confused with low blood sugar.   Poor attention and scattered since surgery.  Also more tired. Occ taken tramadol and otherwise just Tylenol. Impossible to tell the effect of the Viibryd given she hasn't been herself for months. Concerned about Courtney [name]'s memory who is also a patient here.  Wants him to get neuropsych testing. Plan because patient is med sensitive we will start very low-dose fluvoxamine 25 mg nightly Per her request continue Concerta 54 mg every morning  10/25/2020 appointment with the following noted: Several phone calls since being here.  The first led to increasing fluvoxamine to 1-1/2 of the 25 mg tablets due to lack of effect at 3 weeks. 09/21/2020 she called asking to stop Concerta which she did. She called again wanting to start Adderall.  Because  she had taken it in 2019 it was agreed that she could start Adderall 10 mg twice daily. Stress dealing with husband and whether to move or not.  [name] separate.  Anxiety is very high.  Hard to calm down around him. $ stress.  Explodes with anger at husband.  Doesn't think he can sell the house on his own. Never been this stressed and anxious and in this kind of circumstance before. Never got the Adderall DT need for PA. Only caffeine in the AM Plan: Rec increase Luvox from 37.5 mg daily to 50 mg HS for a week then increase  to 75 mg daily and possibly higher.  B12 level checked 496 and normal. Hold Adderall until the anxiety is under control.   12/03/2020 phone call from patient: Patient called stating fluvoxamine made her more anxious and high strung and she stopped it.  She wanted an earlier appointment which was not available at the time she was given the option to see a nurse practitioner but refused.  12/19/2020 appointment with the following noted: Life very stressful right now and not getting better.  B in law died and marriage falling apart. Separating.   More than I can handle including anxiety and depression. Plan: Rec trial beta blocker propranolol 10-30 mg  twice daily for anxiety Consider TCA bc Genesight test  B12 level checked 496 and normal. Hold Adderall until the anxiety is under control.   01/01/2021 phone call from patient with nurse as follows: She is taking up to 30 mg propranolol twice daily without sufficient benefit for her anxiety.  01/03/2021 appointment with the following noted: Rare Adderall.  Propranolol didn't help anxiety much at 30 mg BID without SE.[name]   Thinks she's gotten depressed which is unusual.  In a perfect storm with divorce and moving and financial stress.  Not functioning well.  Hard to make a decision.  Poor productivity.   Having a lot of pain, chronically and worse lately. Starting Lyrica today. Taking alprazolam about 0.5 mg daily. Plan: Yes imipramine 25 and increase to 75 mg HS and then check blood level  02/26/21 appt noted: 01/21/2021 serum imipramine and desipramine total was 65 at 75 mg daily. It has made a difference.  But has made so many changes and moving.  Stress goes to GI system.   Was in ER last night with GI px and [name] have ulcer.  Unable to eat without nausea.  Lump in the throat nausea. Mild taking the edge off.  Can still explode. Can't remember the SE at 100 mg daily. GI dominating with pain and nausea. Sleep varies from good or bothered by  sickness. Usually fine but sometimes can't turn her brain off.  Moves in 5 days.   Past Psychiatric Medication Trials: Trintellix NR, sertraline?,  Viibryd 20, remote zoloft, Paxil, Cymbalta couple days ? Adverse reaction Fluvoxamine SE anxiety but ? Adequacy of trial bc stress Fluvoxamine 100 in 2015 per Dr. [name] Gabapentin 1600 NR Lyrica  Vyvanse, Adderall, concerta DC propranolol 10-30 mg  twice daily for anxiety bc not helpful History Genesight History of Dr. [name] and Pauline Good  Review of Systems:  Review of Systems  Cardiovascular:  Negative for chest pain and palpitations.  Gastrointestinal:  Positive for nausea.  Musculoskeletal:  Positive for back pain.  Neurological:  Positive for weakness. Negative for tremors.  Psychiatric/Behavioral:  Positive for decreased concentration and dysphoric mood. Negative for confusion and hallucinations. The patient is nervous/anxious.    Medications: I  have reviewed the patient's current medications.  Current Outpatient Medications  Medication Sig Dispense Refill   ALPRAZolam (XANAX) 1 MG tablet Take 0.5-1 tablets (0.5-1 mg total) by mouth 3 (three) times daily as needed. 40 tablet 1   amphetamine-dextroamphetamine (ADDERALL) 10 MG tablet Take 1 tablet (10 mg total) by mouth 2 (two) times daily with a meal. 60 tablet 0   Calcium Carb-Cholecalciferol (CALCIUM 600+D3 PO) Take 1,200 tablets by mouth daily.      estradiol (ESTRACE) 1 MG tablet Take 1.5 tablets (1.5 mg total) by mouth daily. 135 tablet 1   imipramine (TOFRANIL) 25 MG tablet 1 at night for 5 night, then 2 at night for 5 nights, then 3 at night (Patient taking differently: 3 and 1/2 tablets daily) 90 tablet 1   meloxicam (MOBIC) 15 MG tablet Take 15 mg by mouth daily.     methocarbamol (ROBAXIN) 500 MG tablet Take 1 tablet (500 mg total) by mouth every 6 (six) hours as needed for muscle spasms. 40 tablet 0   NP THYROID 60 MG tablet Take 1 tablet (60 mg total) by mouth daily  before breakfast. 90 tablet 1   omega-3 acid ethyl esters (LOVAZA) 1 g capsule Take 2 capsules (2 g total) by mouth 2 (two) times daily. 120 capsule 11   ondansetron (ZOFRAN) 4 MG tablet Take 1 tablet (4 mg total) by mouth every 8 (eight) hours as needed for nausea or vomiting. 40 tablet 1   pantoprazole (PROTONIX) 40 MG tablet Take 1 tablet (40 mg total) by mouth daily. 30 tablet 0   pravastatin (PRAVACHOL) 20 MG tablet Take one tablet by mouth 4 days per week 48 tablet 3   progesterone (PROMETRIUM) 200 MG capsule Take 2 capsules (400 mg total) by mouth at bedtime. 180 capsule 1   propranolol (INDERAL) 10 MG tablet TAKE 1 TO 3 TABLETS BY MOUTH TWICE DAILY AS NEEDED FOR ANXIETY 540 tablet 0   Testosterone 1.62 % GEL Place 5 mg onto the skin daily. 30 g 3   traMADol (ULTRAM) 50 MG tablet TAKE 1/2 TO 1 TABLET(25 TO 50 MG) BY MOUTH BACK EVERY 8 HOURS AS NEEDED FOR SEVERE PAIN 90 tablet 0   vitamin E 400 UNIT capsule Take 800 Units by mouth daily.      nitroGLYCERIN (NITROSTAT) 0.4 MG SL tablet Place 1 tablet (0.4 mg total) under the tongue every 5 (five) minutes as needed for chest pain. 25 tablet 2   pregabalin (LYRICA) 75 MG capsule Take 75 mg by mouth 2 (two) times daily. (Patient not taking: No sig reported)     No current facility-administered medications for this visit.    Medication Side Effects: Other: dry mouth  Allergies:  Allergies  Allergen Reactions   Clarithromycin Nausea Only   Covid-19 (Mrna) Vaccine Other (See Comments)    Acute Vasculitis; excessive bleeding    Other Other (See Comments)   Bactrim [Sulfamethoxazole-Trimethoprim] Nausea And Vomiting   Morphine Nausea And Vomiting    Other reaction(s): Nausea/Vomiting   Morphine And Related Nausea And Vomiting   Azithromycin Nausea And Vomiting and Nausea Only   Ceclor [Cefaclor] Nausea And Vomiting    Can take Augmentin ok   Codeine Nausea And Vomiting   Cymbalta [Duloxetine Hcl]    Erythromycin Nausea And Vomiting     Other reaction(s): Nausea/Vomiting   Erythromycin Base Nausea And Vomiting   Levofloxacin Nausea Only    REACTION: nausea   Loratadine     REACTION: bruises  Nitrofurantoin Nausea Only   Oxycodone Other (See Comments)   Penicillins Nausea And Vomiting   Propoxyphene Nausea Only    dizzy   Propoxyphene N-Acetaminophen Nausea Only    dizzy    Past Medical History:  Diagnosis Date   ADD (attention deficit disorder)    Anxiety    Celiac disease    possible vs IBS   Complication of anesthesia    Coronary artery disease    Depression    no bipolar per Dr. [name]   GI problem    Olevia Perches   Gluten intolerance    Hyperlipidemia    IBS (irritable bowel syndrome)    Osteoarthritis    Osteoporosis    PONV (postoperative nausea and vomiting)    Rheumatoid arthritis ([place])    Seasonal allergies 01-08-12   hx. of multiple bronchitis related to this.   Shoulder pain, right     Family History  Problem Relation Age of Onset   Heart disease Mother    Heart attack Mother    Prostate cancer Father    Coronary artery disease Other        FH Female 1st degree relative <60   ADD / ADHD Other     Social History   Socioeconomic History   Marital status: Married    Spouse name: Not on file   Number of children: Not on file   Years of education: Not on file   Highest education level: Not on file  Occupational History   Occupation: Futures trader  Tobacco Use   Smoking status: Never   Smokeless tobacco: Never   Tobacco comments:    only Hospital doctor   Vaping Use: Never used  Substance and Sexual Activity   Alcohol use: Yes    Alcohol/week: 0.0 standard drinks    Comment: rare social    Drug use: No   Sexual activity: Yes  Other Topics Concern   Not on file  Social History Narrative   Married for last 42 years.Lives with husband.Retired Insurance account manager.Originally from [place].   Social Determinants of Health   Financial Resource Strain: Low Risk     Difficulty of Paying Living Expenses: Not hard at all  Food Insecurity: No Food Insecurity   Worried About Charity fundraiser in the Last Year: Never true   [place] in the Last Year: Never true  Transportation Needs: No Transportation Needs   Lack of Transportation (Medical): No   Lack of Transportation (Non-Medical): No  Physical Activity: Sufficiently Active   Days of Exercise per Week: 5 days   Minutes of Exercise per Session: 30 min  Stress: No Stress Concern Present   Feeling of Stress : Not at all  Social Connections: Socially Integrated   Frequency of Communication with Friends and Family: More than three times a week   Frequency of Social Gatherings with Friends and Family: More than three times a week   Attends Religious Services: 1 to 4 times per year   Active Member of [employer] or Organizations: Yes   Attends Archivist Meetings: 1 to 4 times per year   Marital Status: Married  Human resources officer Violence: Not At Risk   Fear of Current or Ex-Partner: No   Emotionally Abused: No   Physically Abused: No   Sexually Abused: No    Past Medical History, Surgical history, Social history, and Family history were reviewed and updated as appropriate.   Please see review of systems  for further details on the patient's review from today.   Objective:   Physical Exam:  LMP 01/08/1999   Physical Exam Constitutional:      General: She is not in acute distress. Musculoskeletal:        General: No deformity.  Neurological:     Mental Status: She is alert and oriented to person, place, and time.     Coordination: Coordination normal.  Psychiatric:        Attention and Perception: Attention and perception normal. She does not perceive auditory or visual hallucinations.        Mood and Affect: Mood is anxious and depressed. Affect is not labile, blunt, angry or inappropriate.        Speech: Speech normal. Speech is not slurred.        Behavior: Behavior is not  hyperactive.        Thought Content: Thought content normal. Thought content is not paranoid or delusional. Thought content does not include homicidal or suicidal ideation. Thought content does not include homicidal or suicidal plan.        Cognition and Memory: Cognition and memory normal.        Judgment: Judgment normal.     Comments: Insight intact Hyperactive style ongoing but less intense and less depressed Stressed.    Lab Review:     Component Value Date/Time   NA 127 (L) 02/25/2021 1614   NA 134 06/14/2018 1444   K 4.4 02/25/2021 1614   CL 93 (L) 02/25/2021 1614   CO2 27 02/25/2021 1614   GLUCOSE 94 02/25/2021 1614   BUN 16 02/25/2021 1614   BUN 10 06/14/2018 1444   CREATININE 0.69 02/25/2021 1614   CREATININE 0.76 04/10/2020 1431   CALCIUM 9.5 02/25/2021 1614   PROT 6.8 02/25/2021 1614   PROT 6.5 12/19/2020 0754   ALBUMIN 4.1 02/25/2021 1614   ALBUMIN 4.5 12/19/2020 0754   AST 16 02/25/2021 1614   ALT 12 02/25/2021 1614   ALKPHOS 47 02/25/2021 1614   BILITOT 0.3 02/25/2021 1614   BILITOT 0.4 12/19/2020 0754   GFRNONAA >60 02/25/2021 1614   GFRNONAA 79 04/10/2020 1431   GFRAA 91 04/10/2020 1431       Component Value Date/Time   WBC 5.3 02/25/2021 1614   RBC 4.42 02/25/2021 1614   HGB 13.5 02/25/2021 1614   HCT 39.6 02/25/2021 1614   PLT 365 02/25/2021 1614   MCV 89.6 02/25/2021 1614   MCH 30.5 02/25/2021 1614   MCHC 34.1 02/25/2021 1614   RDW 11.3 (L) 02/25/2021 1614   LYMPHSABS 2.0 06/14/2018 0005   MONOABS 0.7 06/14/2018 0005   EOSABS 0.2 06/14/2018 0005   BASOSABS 0.0 06/14/2018 0005    No results found for: POCLITH, LITHIUM   No results found for: PHENYTOIN, PHENOBARB, VALPROATE, CBMZ   06/04/20 B12 normal at 496  .res Assessment: Plan:    Courtney [name] was seen today for follow-up, anxiety, depression and stress.  Diagnoses and all orders for this visit:  Major depressive disorder, recurrent episode, moderate (HCC)  Generalized anxiety  disorder  Attention deficit hyperactivity disorder (ADHD), combined type  Greater than 50% of 30 min face to face time with patient was spent on counseling and coordination of care. We discussed  Genesight testing and her desire to take something for anxiety that is in the "Use as Directed" column.    Consider TCA bc Genesight test;  disc SE in detail incl cardiac, etc Continue imipramine and increase to 100  mg again as soon as nausea managed.  Add risperidone 1 mg HS off label for nausea and anxiety.   B12 level checked 496 and normal.  Hold Adderall until the anxiety is under control.  . Dialectical Behavior Therapy at [facility]   Supportive therapy around marital crisis and other stressors.  Also extensive discussion about her frustration with availability of psychiatrist appts.  Disc boundaries around this issues and how to get messages back and forth.  Started therapy with Godfrey Pick.  FU 8 weeks  Lynder Parents, MD, DFAPA   Please see After Visit Summary for patient specific instructions.  Future Appointments  Date Time Provider [place]  03/28/2021  1:30 PM Cottle, Billey Co., MD [facility] None  05/01/2021  7:45 AM [facility]  05/01/2021  9:00 AM [facility] VASC 4 [facility]  05/02/2021  9:00 AM Cottle, Billey Co., MD [facility] None    No orders of the defined types were placed in this encounter.    -------------------------------

## 2021-02-27 ENCOUNTER — Encounter: Payer: Self-pay | Admitting: Internal Medicine

## 2021-02-27 DIAGNOSIS — R112 Nausea with vomiting, unspecified: Secondary | ICD-10-CM | POA: Insufficient documentation

## 2021-02-27 DIAGNOSIS — R1084 Generalized abdominal pain: Secondary | ICD-10-CM | POA: Insufficient documentation

## 2021-02-27 NOTE — Assessment & Plan Note (Signed)
Pt afeb, exam with mild tender but non distended, for lactulose prn if films neg for obstruction, and cbc with labs,  to f/u any worsening symptoms or concerns

## 2021-02-27 NOTE — Assessment & Plan Note (Signed)
With situational worsening it seems due to social stressors increased, for xanax tid prn, cont counseling

## 2021-02-27 NOTE — Assessment & Plan Note (Addendum)
I suspect related to constipation, for abd films, zofran prn,  to f/u any worsening symptoms or concerns

## 2021-02-28 DIAGNOSIS — Z96652 Presence of left artificial knee joint: Secondary | ICD-10-CM | POA: Diagnosis not present

## 2021-03-06 ENCOUNTER — Other Ambulatory Visit: Payer: Self-pay

## 2021-03-06 ENCOUNTER — Encounter: Payer: Self-pay | Admitting: Internal Medicine

## 2021-03-06 ENCOUNTER — Ambulatory Visit (INDEPENDENT_AMBULATORY_CARE_PROVIDER_SITE_OTHER): Payer: Medicare Other | Admitting: Internal Medicine

## 2021-03-06 DIAGNOSIS — R1084 Generalized abdominal pain: Secondary | ICD-10-CM

## 2021-03-06 DIAGNOSIS — E871 Hypo-osmolality and hyponatremia: Secondary | ICD-10-CM | POA: Insufficient documentation

## 2021-03-06 DIAGNOSIS — I2510 Atherosclerotic heart disease of native coronary artery without angina pectoris: Secondary | ICD-10-CM

## 2021-03-06 DIAGNOSIS — M059 Rheumatoid arthritis with rheumatoid factor, unspecified: Secondary | ICD-10-CM | POA: Diagnosis not present

## 2021-03-06 DIAGNOSIS — N959 Unspecified menopausal and perimenopausal disorder: Secondary | ICD-10-CM | POA: Diagnosis not present

## 2021-03-06 DIAGNOSIS — R112 Nausea with vomiting, unspecified: Secondary | ICD-10-CM

## 2021-03-06 MED ORDER — PROGESTERONE 200 MG PO CAPS: 400.0000 mg | ORAL_CAPSULE | Freq: Every evening | ORAL | 1 refills | Status: DC

## 2021-03-06 MED ORDER — ESTRADIOL 1 MG PO TABS: 1.5000 mg | ORAL_TABLET | Freq: Every day | ORAL | 1 refills | Status: DC

## 2021-03-06 MED ORDER — NP THYROID 60 MG PO TABS: 60.0000 mg | ORAL_TABLET | Freq: Every day | ORAL | 1 refills | Status: DC

## 2021-03-06 MED ORDER — MELOXICAM 7.5 MG PO TABS: 7.5000 mg | ORAL_TABLET | Freq: Every day | ORAL | 1 refills | Status: DC | PRN

## 2021-03-06 MED ORDER — PANTOPRAZOLE SODIUM 40 MG PO TBEC: 40.0000 mg | DELAYED_RELEASE_TABLET | Freq: Every day | ORAL | 3 refills | Status: DC

## 2021-03-06 NOTE — Assessment & Plan Note (Signed)
Likely due to NSAID and stress induced gastritis/ulcer/colitis.  Protonix seems to help She has been taking meloxicam 15 mg daily for rheumatoid arthritis.  She cannot do without it due to stiffness, pain in the joints.  We will try to reduce meloxicam to 7.5 mg daily with an option to take 15 mg on a bad day.  We will continue with Protonix daily.  Will obtain a gastroenterology consultation

## 2021-03-06 NOTE — Progress Notes (Signed)
Subjective:  Patient ID: Courtney [name], female    DOB: 05-27-1950  Age: 70 y.o. MRN: [id number]  CC: Follow-up (ER f/u)   HPI Courtney [name] presents for diffuse abd pain, n/v which is better now. She saw Dr Jenny Reichmann on 10/27, he  prescribed lactulose and Zofran. The pt went to ER on 10/31 w/more abdominal pain- Dr Melina Copa gave Protonix po - it helped. Pt just sold her house and will move...a lot of stress... She has been taking meloxicam 15 mg daily for rheumatoid arthritis.  She cannot do without it due to stiffness, pain in the joints. She is asking about identical hormone replacement treatment continuation.  It used to be done by Dr. Anastasio Champion  Outpatient Medications Prior to Visit  Medication Sig Dispense Refill   ALPRAZolam (XANAX) 1 MG tablet Take 0.5-1 tablets (0.5-1 mg total) by mouth 3 (three) times daily as needed. 40 tablet 1   amphetamine-dextroamphetamine (ADDERALL) 10 MG tablet Take 1 tablet (10 mg total) by mouth 2 (two) times daily with a meal. 60 tablet 0   Calcium Carb-Cholecalciferol (CALCIUM 600+D3 PO) Take 1,200 tablets by mouth daily.      Cholecalciferol (VITAMIN D3) 125 MCG (5000 UT) CAPS See admin instructions. Take 1 by mouth as directed     imipramine (TOFRANIL) 25 MG tablet 1 at night for 5 night, then 2 at night for 5 nights, then 3 at night (Patient taking differently: 3 and 1/2 tablets daily) 90 tablet 1   methocarbamol (ROBAXIN) 500 MG tablet Take 1 tablet (500 mg total) by mouth every 6 (six) hours as needed for muscle spasms. 40 tablet 0   omega-3 acid ethyl esters (LOVAZA) 1 g capsule Take 2 capsules (2 g total) by mouth 2 (two) times daily. 120 capsule 11   ondansetron (ZOFRAN) 4 MG tablet Take 1 tablet (4 mg total) by mouth every 8 (eight) hours as needed for nausea or vomiting. 40 tablet 1   pravastatin (PRAVACHOL) 20 MG tablet Take one tablet by mouth 4 days per week 48 tablet 3   pregabalin (LYRICA) 75 MG capsule Take 75 mg by mouth 2 (two)  times daily.     propranolol (INDERAL) 10 MG tablet TAKE 1 TO 3 TABLETS BY MOUTH TWICE DAILY AS NEEDED FOR ANXIETY 540 tablet 0   risperiDONE (RISPERDAL) 1 MG tablet Take 1 tablet (1 mg total) by mouth at bedtime. 30 tablet 1   Testosterone 1.62 % GEL Place 5 mg onto the skin daily. 30 g 3   traMADol (ULTRAM) 50 MG tablet TAKE 1/2 TO 1 TABLET(25 TO 50 MG) BY MOUTH BACK EVERY 8 HOURS AS NEEDED FOR SEVERE PAIN 90 tablet 0   vitamin E 400 UNIT capsule Take 800 Units by mouth daily.      estradiol (ESTRACE) 1 MG tablet Take 1.5 tablets (1.5 mg total) by mouth daily. 135 tablet 1   meloxicam (MOBIC) 15 MG tablet Take 15 mg by mouth daily.     NP THYROID 60 MG tablet Take 1 tablet (60 mg total) by mouth daily before breakfast. 90 tablet 1   pantoprazole (PROTONIX) 40 MG tablet Take 1 tablet (40 mg total) by mouth daily. 30 tablet 0   progesterone (PROMETRIUM) 200 MG capsule Take 2 capsules (400 mg total) by mouth at bedtime. 180 capsule 1   nitroGLYCERIN (NITROSTAT) 0.4 MG SL tablet Place 1 tablet (0.4 mg total) under the tongue every 5 (five) minutes as needed for chest pain. 25 tablet 2  No facility-administered medications prior to visit.    ROS: Review of Systems  Constitutional:  Positive for fatigue. Negative for activity change, appetite change, chills and unexpected weight change.  HENT:  Negative for congestion, mouth sores and sinus pressure.   Eyes:  Negative for visual disturbance.  Respiratory:  Negative for cough and chest tightness.   Cardiovascular:  Negative for chest pain.  Gastrointestinal:  Positive for nausea. Negative for abdominal pain and vomiting.  Genitourinary:  Negative for difficulty urinating, frequency and vaginal pain.  Musculoskeletal:  Positive for arthralgias, back pain and gait problem.  Skin:  Negative for pallor and rash.  Neurological:  Negative for dizziness, tremors, weakness, numbness and headaches.  Psychiatric/Behavioral:  Negative for confusion and  sleep disturbance.    Objective:  BP (!) 142/78 (BP Location: Left Arm)   Pulse 96   Temp 97.7 F (36.5 C) (Oral)   Ht 4\' 11"  (1.499 m)   Wt 111 lb 12.8 oz (50.7 kg)   LMP 01/08/1999   SpO2 98%   BMI 22.58 kg/m   BP Readings from Last 3 Encounters:  03/06/21 (!) 142/78  02/25/21 138/88  02/21/21 122/76    Wt Readings from Last 3 Encounters:  03/06/21 111 lb 12.8 oz (50.7 kg)  02/25/21 110 lb (49.9 kg)  02/21/21 110 lb 12.8 oz (50.3 kg)    Physical Exam Constitutional:      General: She is not in acute distress.    Appearance: She is well-developed. She is not ill-appearing.  HENT:     Head: Normocephalic.     Right Ear: External ear normal.     Left Ear: External ear normal.     Nose: Nose normal.  Eyes:     General:        Right eye: No discharge.        Left eye: No discharge.     Conjunctiva/sclera: Conjunctivae normal.     Pupils: Pupils are equal, round, and reactive to light.  Neck:     Thyroid: No thyromegaly.     Vascular: No JVD.     Trachea: No tracheal deviation.  Cardiovascular:     Rate and Rhythm: Normal rate and regular rhythm.     Heart sounds: Normal heart sounds.  Pulmonary:     Effort: No respiratory distress.     Breath sounds: No stridor. No wheezing.  Abdominal:     General: Bowel sounds are normal. There is no distension.     Palpations: Abdomen is soft. There is no mass.     Tenderness: There is no abdominal tenderness. There is no guarding or rebound.  Musculoskeletal:        General: No tenderness.     Cervical back: Normal range of motion and neck supple. No rigidity.  Lymphadenopathy:     Cervical: No cervical adenopathy.  Skin:    Findings: No erythema or rash.  Neurological:     Mental Status: She is oriented to person, place, and time.     Cranial Nerves: No cranial nerve deficit.     Motor: No abnormal muscle tone.     Coordination: Coordination normal.     Deep Tendon Reflexes: Reflexes normal.  Psychiatric:         Behavior: Behavior normal.        Thought Content: Thought content normal.        Judgment: Judgment normal.     A total time of 45 minutes was spent preparing to see  the patient, reviewing tests, x-rays, operative reports and outside records.  Also, obtaining history and performing comprehensive physical exam.  Additionally, counseling the patient regarding the above listed issues.   Finally, documenting clinical information in the health records, coordination of care, educating the patient regarding NSAID effect on the GI tract, identical hormone replacement concept, hyponatremia.  It is a complex case.   Lab Results  Component Value Date   WBC 5.3 02/25/2021   HGB 13.5 02/25/2021   HCT 39.6 02/25/2021   PLT 365 02/25/2021   GLUCOSE 94 02/25/2021   CHOL 203 (H) 12/19/2020   TRIG 93 12/19/2020   HDL 98 12/19/2020   LDLDIRECT 120.7 08/04/2012   LDLCALC 89 12/19/2020   ALT 12 02/25/2021   AST 16 02/25/2021   NA 127 (L) 02/25/2021   K 4.4 02/25/2021   CL 93 (L) 02/25/2021   CREATININE 0.69 02/25/2021   BUN 16 02/25/2021   CO2 27 02/25/2021   TSH 0.35 (L) 08/27/2020   INR 1.0 07/06/2020   HGBA1C 5.4 02/11/2018    CT Abdomen Pelvis W Contrast  Result Date: 02/25/2021 CLINICAL DATA:  Abdominal pain. EXAM: CT ABDOMEN AND PELVIS WITH CONTRAST TECHNIQUE: Multidetector CT imaging of the abdomen and pelvis was performed using the standard protocol following bolus administration of intravenous contrast. CONTRAST:  44mL OMNIPAQUE IOHEXOL 300 MG/ML  SOLN COMPARISON:  Abdominal ultrasound dated 05/23/2015. FINDINGS: Lower chest: The visualized lung bases are clear. There is coronary vascular calcification. No intra-abdominal free air. Evaluation of the pelvis is limited due to streak artifact caused by bilateral hip arthroplasties. A somewhat loculated fluid in the posterior pelvis is not excluded (47/2). Hepatobiliary: The liver is unremarkable. No intrahepatic biliary ductal dilatation. The  gallbladder is unremarkable. Pancreas: Unremarkable. No pancreatic ductal dilatation or surrounding inflammatory changes. Spleen: Normal in size without focal abnormality. Adrenals/Urinary Tract: The adrenal glands are unremarkable. There is no hydronephrosis on either side. There is symmetric enhancement and excretion of contrast by both kidneys. There is a 15 mm left renal inferior pole cyst. 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 Stomach/Bowel: Moderate stool throughout the colon. There is no bowel obstruction. The appendix is normal. Vascular/Lymphatic: Mild aortoiliac atherosclerotic disease. The IVC is unremarkable. No portal venous gas. There is no adenopathy. Reproductive: The uterus is retroverted and grossly unremarkable. Other: None Musculoskeletal: Severe degenerative changes of the spine. Lower lumbar anterolisthesis. Bilateral hip arthroplasties. No acute osseous pathology. IMPRESSION: 1. No acute intra-abdominal or pelvic pathology. 2. Moderate colonic stool burden. No bowel obstruction. Normal appendix. 3. Aortic Atherosclerosis (ICD10-I70.0). Electronically Signed   By: Anner Crete M.D.   On: 02/25/2021 20:15    Assessment & Plan:   Problem List Items Addressed This Visit     Generalized abdominal pain    Likely due to NSAID and stress induced gastritis/ulcer/colitis.  Protonix seems to help She has been taking meloxicam 15 mg daily for rheumatoid arthritis.  She cannot do without it due to stiffness, pain in the joints.  We will try to reduce meloxicam to 7.5 mg daily with an option to take 15 mg on a bad day.  We will continue with Protonix daily.  Will obtain a gastroenterology consultation      Relevant Orders   Ambulatory referral to Gastroenterology   Hyponatremia    Restrict water intake to 1500 cc daily.  Recheck sodium in 6- 8 weeks      Menopausal disorder  We discussed identical hormone replacement  concept.  I will renew her hormones for a month or two.  We will have to find another physician who does identical hormone replacement treatments - Alexander T. Augoustides, MD - [facility] integrative medicine.       Nausea & vomiting    Likely due to NSAID and stress induced gastritis/ulcer/colitis.  Protonix seems to help She has been taking meloxicam 15 mg daily for rheumatoid arthritis.  She cannot do without it due to stiffness, pain in the joints.  We will try to reduce meloxicam to 7.5 mg daily with an option to take 15 mg on a bad day.  We will continue with Protonix daily.  Will obtain a gastroenterology consultation      RA (rheumatoid arthritis) ([place])    She has been taking meloxicam 15 mg daily for rheumatoid arthritis.  She cannot do without it due to stiffness, pain in the joints.  We will try to reduce meloxicam to 7.5 mg daily with an option to take 15 mg on a bad day.  We will continue with Protonix daily.       Relevant Medications   meloxicam (MOBIC) 7.5 MG tablet      Meds ordered this encounter  Medications   pantoprazole (PROTONIX) 40 MG tablet    Sig: Take 1 tablet (40 mg total) by mouth daily.    Dispense:  90 tablet    Refill:  3   meloxicam (MOBIC) 7.5 MG tablet    Sig: Take 1-2 tablets (7.5-15 mg total) by mouth daily as needed for pain.    Dispense:  180 tablet    Refill:  1   estradiol (ESTRACE) 1 MG tablet    Sig: Take 1.5 tablets (1.5 mg total) by mouth daily.    Dispense:  45 tablet    Refill:  1   NP THYROID 60 MG tablet    Sig: Take 1 tablet (60 mg total) by mouth daily before breakfast.    Dispense:  30 tablet    Refill:  1   progesterone (PROMETRIUM) 200 MG capsule    Sig: Take 2 capsules (400 mg total) by mouth at bedtime.    Dispense:  60 capsule    Refill:  1    Correct dose      Follow-up: Return in about 3 months (around 06/06/2021) for a follow-up visit.  Walker Kehr, MD

## 2021-03-06 NOTE — Assessment & Plan Note (Signed)
We discussed identical hormone replacement concept.  I will renew her hormones for a month or two.  We will have to find another physician who does identical hormone replacement treatments - Alexander T. Augoustides, MD - [facility] integrative medicine.

## 2021-03-06 NOTE — Assessment & Plan Note (Addendum)
Restrict water intake to 1500 cc daily.  Recheck sodium in 6- 8 weeks

## 2021-03-06 NOTE — Patient Instructions (Signed)
Hyponatremia ?Hyponatremia is when the amount of salt (sodium) in your blood is too low. When salt levels are low, your body cells may take in extra water. This can cause swelling. The swelling often affects the brain. ?What are the causes? ?Certain medical problems or conditions. ?Vomiting a lot. ?Having watery poop (diarrhea) often. ?Sweating too much. ?Taking certain medicines or using illegal drugs. ?Fluids given through an IV tube. ?What increases the risk? ?Having heart, kidney, or liver failure. ?Having a medical condition that causes you to have watery poop a lot. ?Doing very hard exercises. ?Taking medicines that affect the amount of salt that is in your blood. ?What are the signs or symptoms? ?Symptoms of this condition include: ?Headache. ?Feeling like you may vomit (nausea). ?Vomiting. ?Being very tired. ?Muscle weakness and cramps. ?Not wanting to eat as much as normal. ?Feeling weak or dizzy. ?Very bad symptoms of this condition include: ?Confusion. ?Feeling restless. ?Having a fast heart rate. ?Fainting. ?Seizures. ?Coma. ?How is this treated? ?Treatment for this condition depends on the cause. Treatment may include: ?Getting fluids through an IV tube that is put into one of your veins. ?Taking medicines to fix the salt levels in your blood. If medicines are causing the problem, your medicines will need to be changed. ?Limiting how much water or fluid you take in, in some cases. ?Monitoring in the hospital to watch your symptoms. ?Follow these instructions at home: ? ?Take over-the-counter and prescription medicines only as told by your doctor. Many medicines can make this condition worse. Talk with your doctor about any medicines that you are taking. ?Do not drink alcohol. ?Keep all follow-up visits. ?Contact a doctor if: ?You feel more like you may vomit. ?You feel more tired. ?Your headache gets worse. ?You feel more confused. ?You feel weaker. ?Your symptoms go away and then they come back. ?Get  help right away if: ?You have a seizure. ?You faint. ?You keep having watery poop. ?You keep vomiting. ?Summary ?Hyponatremia is when the amount of salt in your blood is too low. ?When salt levels are low, you can have swelling throughout the body. The swelling mostly affects the brain. ?Treatment depends on the cause. ?Treatment may include IV fluids and changing medicines. ?This information is not intended to replace advice given to you by your health care provider. Make sure you discuss any questions you have with your health care provider. ?Document Revised: 10/23/2020 Document Reviewed: 10/23/2020 ?Elsevier Patient Education ? 2022 [employer] ? ?

## 2021-03-06 NOTE — Assessment & Plan Note (Signed)
She has been taking meloxicam 15 mg daily for rheumatoid arthritis.  She cannot do without it due to stiffness, pain in the joints.  We will try to reduce meloxicam to 7.5 mg daily with an option to take 15 mg on a bad day.  We will continue with Protonix daily.

## 2021-03-06 NOTE — Assessment & Plan Note (Addendum)
Likely due to NSAID and stress induced gastritis/ulcer/colitis.  Protonix seems to help She has been taking meloxicam 15 mg daily for rheumatoid arthritis.  She cannot do without it due to stiffness, pain in the joints.  We will try to reduce meloxicam to 7.5 mg daily with an option to take 15 mg on a bad day.  We will continue with Protonix daily.  Will obtain a gastroenterology consultation

## 2021-03-07 ENCOUNTER — Other Ambulatory Visit: Payer: Self-pay | Admitting: Psychiatry

## 2021-03-07 DIAGNOSIS — F411 Generalized anxiety disorder: Secondary | ICD-10-CM

## 2021-03-07 DIAGNOSIS — F331 Major depressive disorder, recurrent, moderate: Secondary | ICD-10-CM

## 2021-03-25 DIAGNOSIS — Z800 Family history of malignant neoplasm of digestive organs: Secondary | ICD-10-CM | POA: Diagnosis not present

## 2021-03-25 DIAGNOSIS — R1010 Upper abdominal pain, unspecified: Secondary | ICD-10-CM | POA: Diagnosis not present

## 2021-03-25 DIAGNOSIS — K5900 Constipation, unspecified: Secondary | ICD-10-CM | POA: Diagnosis not present

## 2021-03-25 DIAGNOSIS — R110 Nausea: Secondary | ICD-10-CM | POA: Diagnosis not present

## 2021-03-28 ENCOUNTER — Ambulatory Visit (INDEPENDENT_AMBULATORY_CARE_PROVIDER_SITE_OTHER): Payer: Medicare Other | Admitting: Psychiatry

## 2021-03-28 ENCOUNTER — Other Ambulatory Visit: Payer: Self-pay

## 2021-03-28 ENCOUNTER — Encounter: Payer: Self-pay | Admitting: Psychiatry

## 2021-03-28 DIAGNOSIS — I2510 Atherosclerotic heart disease of native coronary artery without angina pectoris: Secondary | ICD-10-CM

## 2021-03-28 DIAGNOSIS — F411 Generalized anxiety disorder: Secondary | ICD-10-CM | POA: Diagnosis not present

## 2021-03-28 DIAGNOSIS — F331 Major depressive disorder, recurrent, moderate: Secondary | ICD-10-CM | POA: Diagnosis not present

## 2021-03-28 DIAGNOSIS — F902 Attention-deficit hyperactivity disorder, combined type: Secondary | ICD-10-CM

## 2021-03-28 MED ORDER — IMIPRAMINE HCL 50 MG PO TABS: 100.0000 mg | ORAL_TABLET | Freq: Every day | ORAL | 1 refills | Status: DC

## 2021-03-28 MED ORDER — LORAZEPAM 1 MG PO TABS
1.0000 mg | ORAL_TABLET | Freq: Three times a day (TID) | ORAL | 1 refills | Status: DC
Start: 2021-03-28 — End: 2023-11-30

## 2021-03-28 NOTE — Patient Instructions (Signed)
Increase imipramine to 100 mg nightly

## 2021-03-28 NOTE — Progress Notes (Signed)
Courtney [name] [id number] 03-08-1951 70 y.o.  Subjective:   Patient ID:  Courtney [name] is a 70 y.o. (DOB 01-15-1951) female.  Chief Complaint:  Chief Complaint  Patient presents with   Follow-up   Anxiety   Depression    HPI Courtney [name] presents to the office today for follow-up of first visit 03/01/2020 referred by a friend [name].  Was prescribed Viibryd and Vyvanse was changed to Concerta 54 mg to try to reduce the amount of dry mouth.  05/02/2020 appointment with the following noted: Less dry mouth with Concerta but didn't seem to last beyond 3-4 PM. Viibryd 20 mg daily.  Hard to get enough calories at breakfast.  But has been taking both in the morning.  Some benefit with Viibryd. Is there something I can take besides a stimulant, bc fears it is stressing her body.  Dx college exhausted adrenal glands.  It helps.  Asked questions about alternatives to stimulants.  Reduced Concerta to reduce anxiety and see if can achieve, but she thinks it's worse in the evening. Anxiety is still a struggle.  Life crisis moment.  Chronic marital dissatisfaction worse now that both are retired.  Feels like she's in a prison.  Family notices she's stressed.  Has done therapy since age 61 yo.  Has done Hindu meditation without help.  Christian faith disciplines.  Grew up caretaking.  I'm done and I need to change.  Goal is calm down enough to function through the situation. Tingling foot and wants B12 testing. Plan: Switch Viibryd to 20 mg in evening meal to get better absorption.  06/18/2020 appointment with following noted: No difference in anxiety or mood with Viibryd 20 mg daily.  She doesn't want to increase it. Questions about Viibryd and Genesight testing.  Asks about differences between Adderall and MPH.   Missed for 3 days Concerta and took Adderall.    Tired by 4-5 PM and doesn't like that.    Plan: She wants to wean off the Viibryd because she does not feel it has been  helpful and she does not want to increase the dosage.  07/03/2020 phone call patient stating she wanted to continue Viibryd 20 mg daily.  She also requested refill of Concerta 54 mg  1/85/6314 appointment with the following noted: Was told she couldn't take Viibryd with one of the pain meds and had TKR.  So stopped it.  Couldn't tolerate pain meds except tramadol.  Had more pain than expected.   Stopped Viibryd about 07/15/20.  She had taken tramadol with Viibryd in the past without a problem.   Noticing more pain after surgery in non-surgical places. Seems to be sensitive to getting lightheaded and confused with low blood sugar.   Poor attention and scattered since surgery.  Also more tired. Occ taken tramadol and otherwise just Tylenol. Impossible to tell the effect of the Viibryd given she hasn't been herself for months. Concerned about Courtney [name]'s memory who is also a patient here.  Wants him to get neuropsych testing. Plan because patient is med sensitive we will start very low-dose fluvoxamine 25 mg nightly Per her request continue Concerta 54 mg every morning  10/25/2020 appointment with the following noted: Several phone calls since being here.  The first led to increasing fluvoxamine to 1-1/2 of the 25 mg tablets due to lack of effect at 3 weeks. 09/21/2020 she called asking to stop Concerta which she did. She called again wanting to start Adderall.  Because she had taken  it in 2019 it was agreed that she could start Adderall 10 mg twice daily. Stress dealing with husband and whether to move or not.  May separate.  Anxiety is very high.  Hard to calm down around him. $ stress.  Explodes with anger at husband.  Doesn't think he can sell the house on his own. Never been this stressed and anxious and in this kind of circumstance before. Never got the Adderall DT need for PA. Only caffeine in the AM Plan: Rec increase Luvox from 37.5 mg daily to 50 mg HS for a week then increase to 75 mg  daily and possibly higher.  B12 level checked 496 and normal. Hold Adderall until the anxiety is under control.   12/03/2020 phone call from patient: Patient called stating fluvoxamine made her more anxious and high strung and she stopped it.  She wanted an earlier appointment which was not available at the time she was given the option to see a nurse practitioner but refused.  12/19/2020 appointment with the following noted: Life very stressful right now and not getting better.  B in law died and marriage falling apart. Separating.   More than I can handle including anxiety and depression. Plan: Rec trial beta blocker propranolol 10-30 mg  twice daily for anxiety Consider TCA bc Genesight test  B12 level checked 496 and normal. Hold Adderall until the anxiety is under control.   01/01/2021 phone call from patient with nurse as follows: She is taking up to 30 mg propranolol twice daily without sufficient benefit for her anxiety.  01/03/2021 appointment with the following noted: Rare Adderall.  Propranolol didn't help anxiety much at 30 mg BID without SE.[name]   Thinks she's gotten depressed which is unusual.  In a perfect storm with divorce and moving and financial stress.  Not functioning well.  Hard to make a decision.  Poor productivity.   Having a lot of pain, chronically and worse lately. Starting Lyrica today. Taking alprazolam about 0.5 mg daily. Plan: Yes imipramine 25 and increase to 75 mg HS and then check blood level  02/26/21 appt noted: 01/21/2021 serum imipramine and desipramine total was 65 at 75 mg daily. It has made a difference.  But has made so many changes and moving.  Stress goes to GI system.   Was in ER last night with GI px and may have ulcer.  Unable to eat without nausea.  Lump in the throat nausea. Mild taking the edge off.  Can still explode. Can't remember the SE at 100 mg daily. GI dominating with pain and nausea. Sleep varies from good or bothered by sickness. Usually  fine but sometimes can't turn her brain off.  Moves in 5 days. Plan: Continue imipramine and increase to 100 mg again as soon as nausea managed. Add risperidone 1 mg HS off label for nausea and anxiety.  03/28/2021 appt noted: She is not taking the risperidone.  Tried it for a couple of weeks but did not help the nausea.  Didn't notice it helping anxiety but had tremor.  Off for 2 weeks and tremor better. Still on imipramine 75 mg nightly.  She did increase but wasn't sure it was causing the tremor as instructed. This week to GI and work up ordered and doubled med dose. Not handling the transition well.  Very irritable with husband.  Such a sense of urgency.   Not often with Xanax.   Not on Adderall.    Past Psychiatric Medication Trials: Trintellix  NR, sertraline?,  Viibryd 20, remote zoloft, Paxil, Cymbalta couple days ? Adverse reaction Fluvoxamine SE anxiety but ? Adequacy of trial bc stress Fluvoxamine 100 in 2015 per Dr. [name] Gabapentin 1600 NR Lyrica  Risperidone 1 tremor Vyvanse, Adderall, concerta DC propranolol 10-30 mg  twice daily for anxiety bc not helpful History Genesight History of Dr. [name] and Pauline Good  Review of Systems:  Review of Systems  Cardiovascular:  Negative for chest pain and palpitations.  Gastrointestinal:  Positive for nausea.  Musculoskeletal:  Positive for back pain.  Neurological:  Positive for weakness. Negative for tremors.  Psychiatric/Behavioral:  Positive for decreased concentration and dysphoric mood. Negative for confusion and hallucinations. The patient is nervous/anxious.    Medications: I have reviewed the patient's current medications.  Current Outpatient Medications  Medication Sig Dispense Refill   amphetamine-dextroamphetamine (ADDERALL) 10 MG tablet Take 1 tablet (10 mg total) by mouth 2 (two) times daily with a meal. 60 tablet 0   estradiol (ESTRACE) 1 MG tablet Take 1.5 tablets (1.5 mg total) by mouth daily. 45 tablet 1    LORazepam (ATIVAN) 1 MG tablet Take 1 tablet (1 mg total) by mouth every 8 (eight) hours. 90 tablet 1   meloxicam (MOBIC) 7.5 MG tablet Take 1-2 tablets (7.5-15 mg total) by mouth daily as needed for pain. 180 tablet 1   methocarbamol (ROBAXIN) 500 MG tablet Take 1 tablet (500 mg total) by mouth every 6 (six) hours as needed for muscle spasms. 40 tablet 0   NP THYROID 60 MG tablet Take 1 tablet (60 mg total) by mouth daily before breakfast. 30 tablet 1   omega-3 acid ethyl esters (LOVAZA) 1 g capsule Take 2 capsules (2 g total) by mouth 2 (two) times daily. 120 capsule 11   ondansetron (ZOFRAN) 4 MG tablet Take 1 tablet (4 mg total) by mouth every 8 (eight) hours as needed for nausea or vomiting. 40 tablet 1   pantoprazole (PROTONIX) 40 MG tablet Take 1 tablet (40 mg total) by mouth daily. 90 tablet 3   pravastatin (PRAVACHOL) 20 MG tablet Take one tablet by mouth 4 days per week 48 tablet 3   progesterone (PROMETRIUM) 200 MG capsule Take 2 capsules (400 mg total) by mouth at bedtime. 60 capsule 1   Testosterone 1.62 % GEL Place 5 mg onto the skin daily. 30 g 3   traMADol (ULTRAM) 50 MG tablet TAKE 1/2 TO 1 TABLET(25 TO 50 MG) BY MOUTH BACK EVERY 8 HOURS AS NEEDED FOR SEVERE PAIN 90 tablet 0   vitamin E 400 UNIT capsule Take 800 Units by mouth daily.      Calcium Carb-Cholecalciferol (CALCIUM 600+D3 PO) Take 1,200 tablets by mouth daily.  (Patient not taking: Reported on 03/28/2021)     Cholecalciferol (VITAMIN D3) 125 MCG (5000 UT) CAPS See admin instructions. Take 1 by mouth as directed (Patient not taking: Reported on 03/28/2021)     imipramine (TOFRANIL) 50 MG tablet Take 2 tablets (100 mg total) by mouth at bedtime. 60 tablet 1   nitroGLYCERIN (NITROSTAT) 0.4 MG SL tablet Place 1 tablet (0.4 mg total) under the tongue every 5 (five) minutes as needed for chest pain. 25 tablet 2   pregabalin (LYRICA) 75 MG capsule Take 75 mg by mouth 2 (two) times daily. (Patient not taking: Reported on  03/28/2021)     propranolol (INDERAL) 10 MG tablet TAKE 1 TO 3 TABLETS BY MOUTH TWICE DAILY AS NEEDED FOR ANXIETY (Patient not taking: Reported on 03/28/2021)  540 tablet 0   No current facility-administered medications for this visit.    Medication Side Effects: Other: dry mouth  Allergies:  Allergies  Allergen Reactions   Clarithromycin Nausea Only   Covid-19 (Mrna) Vaccine Other (See Comments)    Acute Vasculitis; excessive bleeding    Other Other (See Comments)   Bactrim [Sulfamethoxazole-Trimethoprim] Nausea And Vomiting   Morphine Nausea And Vomiting    Other reaction(s): Nausea/Vomiting   Morphine And Related Nausea And Vomiting   Azithromycin Nausea And Vomiting and Nausea Only   Ceclor [Cefaclor] Nausea And Vomiting    Can take Augmentin ok   Codeine Nausea And Vomiting   Cymbalta [Duloxetine Hcl]    Erythromycin Nausea And Vomiting    Other reaction(s): Nausea/Vomiting   Erythromycin Base Nausea And Vomiting   Levofloxacin Nausea Only    REACTION: nausea   Loratadine     REACTION: bruises   Nitrofurantoin Nausea Only   Oxycodone Other (See Comments)   Penicillins Nausea And Vomiting   Propoxyphene Nausea Only    dizzy   Propoxyphene N-Acetaminophen Nausea Only    dizzy    Past Medical History:  Diagnosis Date   ADD (attention deficit disorder)    Anxiety    Celiac disease    possible vs IBS   Complication of anesthesia    Coronary artery disease    Depression    no bipolar per Dr. [name]   GI problem    Olevia Perches   Gluten intolerance    Hyperlipidemia    IBS (irritable bowel syndrome)    Osteoarthritis    Osteoporosis    PONV (postoperative nausea and vomiting)    Rheumatoid arthritis (HCC)    Seasonal allergies 01-08-12   hx. of multiple bronchitis related to this.   Shoulder pain, right     Family History  Problem Relation Age of Onset   Heart disease Mother    Heart attack Mother    Prostate cancer Father    Coronary artery disease Other         FH Female 1st degree relative <60   ADD / ADHD Other     Social History   Socioeconomic History   Marital status: Married    Spouse name: Not on file   Number of children: Not on file   Years of education: Not on file   Highest education level: Not on file  Occupational History   Occupation: Futures trader  Tobacco Use   Smoking status: Never   Smokeless tobacco: Never   Tobacco comments:    only Hospital doctor   Vaping Use: Never used  Substance and Sexual Activity   Alcohol use: Yes    Alcohol/week: 0.0 standard drinks    Comment: rare social    Drug use: No   Sexual activity: Yes  Other Topics Concern   Not on file  Social History Narrative   Married for last 42 years.Lives with husband.Retired Insurance account manager.Originally from [place].   Social Determinants of Health   Financial Resource Strain: Low Risk    Difficulty of Paying Living Expenses: Not hard at all  Food Insecurity: No Food Insecurity   Worried About Charity fundraiser in the Last Year: Never true   [place] in the Last Year: Never true  Transportation Needs: No Transportation Needs   Lack of Transportation (Medical): No   Lack of Transportation (Non-Medical): No  Physical Activity: Sufficiently Active   Days of Exercise per  Week: 5 days   Minutes of Exercise per Session: 30 min  Stress: No Stress Concern Present   Feeling of Stress : Not at all  Social Connections: Socially Integrated   Frequency of Communication with Friends and Family: More than three times a week   Frequency of Social Gatherings with Friends and Family: More than three times a week   Attends Religious Services: 1 to 4 times per year   Active Member of [employer] or Organizations: Yes   Attends Archivist Meetings: 1 to 4 times per year   Marital Status: Married  Human resources officer Violence: Not At Risk   Fear of Current or Ex-Partner: No   Emotionally Abused: No   Physically Abused: No    Sexually Abused: No    Past Medical History, Surgical history, Social history, and Family history were reviewed and updated as appropriate.   Please see review of systems for further details on the patient's review from today.   Objective:   Physical Exam:  LMP 01/08/1999   Physical Exam Constitutional:      General: She is not in acute distress. Musculoskeletal:        General: No deformity.  Neurological:     Mental Status: She is alert and oriented to person, place, and time.     Coordination: Coordination normal.  Psychiatric:        Attention and Perception: Attention and perception normal. She does not perceive auditory or visual hallucinations.        Mood and Affect: Mood is anxious and depressed. Affect is not labile, blunt, angry or inappropriate.        Speech: Speech normal. Speech is not slurred.        Behavior: Behavior is not hyperactive.        Thought Content: Thought content normal. Thought content is not paranoid or delusional. Thought content does not include homicidal or suicidal ideation. Thought content does not include homicidal or suicidal plan.        Cognition and Memory: Cognition and memory normal.        Judgment: Judgment normal.     Comments: 15 min late nsight intact Hyperactive style ongoing but less intense and less depressed Stressed.    Lab Review:     Component Value Date/Time   NA 127 (L) 02/25/2021 1614   NA 134 06/14/2018 1444   K 4.4 02/25/2021 1614   CL 93 (L) 02/25/2021 1614   CO2 27 02/25/2021 1614   GLUCOSE 94 02/25/2021 1614   BUN 16 02/25/2021 1614   BUN 10 06/14/2018 1444   CREATININE 0.69 02/25/2021 1614   CREATININE 0.76 04/10/2020 1431   CALCIUM 9.5 02/25/2021 1614   PROT 6.8 02/25/2021 1614   PROT 6.5 12/19/2020 0754   ALBUMIN 4.1 02/25/2021 1614   ALBUMIN 4.5 12/19/2020 0754   AST 16 02/25/2021 1614   ALT 12 02/25/2021 1614   ALKPHOS 47 02/25/2021 1614   BILITOT 0.3 02/25/2021 1614   BILITOT 0.4 12/19/2020  0754   GFRNONAA >60 02/25/2021 1614   GFRNONAA 79 04/10/2020 1431   GFRAA 91 04/10/2020 1431       Component Value Date/Time   WBC 5.3 02/25/2021 1614   RBC 4.42 02/25/2021 1614   HGB 13.5 02/25/2021 1614   HCT 39.6 02/25/2021 1614   PLT 365 02/25/2021 1614   MCV 89.6 02/25/2021 1614   MCH 30.5 02/25/2021 1614   MCHC 34.1 02/25/2021 1614   RDW 11.3 (L) 02/25/2021  1614   LYMPHSABS 2.0 06/14/2018 0005   MONOABS 0.7 06/14/2018 0005   EOSABS 0.2 06/14/2018 0005   BASOSABS 0.0 06/14/2018 0005    No results found for: POCLITH, LITHIUM   No results found for: PHENYTOIN, PHENOBARB, VALPROATE, CBMZ   06/04/20 B12 normal at 496  01/21/2021 imipramine level 25, desipramine level 40 equals total 65 which is low on 75 mg daily. (Goal 150-250)  .res Assessment: Plan:    Tanette was seen today for follow-up, anxiety and depression.  Diagnoses and all orders for this visit:  Major depressive disorder, recurrent episode, moderate (HCC) -     imipramine (TOFRANIL) 50 MG tablet; Take 2 tablets (100 mg total) by mouth at bedtime.  Generalized anxiety disorder -     imipramine (TOFRANIL) 50 MG tablet; Take 2 tablets (100 mg total) by mouth at bedtime. -     LORazepam (ATIVAN) 1 MG tablet; Take 1 tablet (1 mg total) by mouth every 8 (eight) hours.  Attention deficit hyperactivity disorder (ADHD), combined type  Greater than 50% of 30 min face to face time with patient was spent on counseling and coordination of care. We discussed  Genesight testing and her desire to take something for anxiety that is in the "Use as Directed" column.    Consider TCA bc Genesight test;  disc SE in detail incl cardiac, etc imipramine and increase to 100 mg nightly.   B12 level checked 496 and normal.  Hold Adderall until the anxiety is under control.   Switch Xanax to Ativan 1 mg tid since she is not on Adderall.   Needs help right away.  Consider alternatives for anger outbursts, impulsivity like  lithium, VPA, atypical.  [facility] at [facility]   Supportive therapy around marital crisis and other stressors.  Also extensive discussion about her frustration with availability of psychiatrist appts.  Disc boundaries around this issues and how to get messages back and forth.   Started therapy with Godfrey Pick.  FU 8 weeks  Lynder Parents, MD, DFAPA   Please see After Visit Summary for patient specific instructions.  Future Appointments  Date Time Provider [place]  05/01/2021  7:45 AM [facility]  05/01/2021  9:00 AM [facility] VASC 4 [facility]  05/02/2021  9:00 AM Cottle, Billey Co., MD [facility] None  05/29/2021  9:00 AM Cottle, Billey Co., MD [facility] None    No orders of the defined types were placed in this encounter.    -------------------------------

## 2021-04-12 DIAGNOSIS — D127 Benign neoplasm of rectosigmoid junction: Secondary | ICD-10-CM | POA: Diagnosis not present

## 2021-04-12 DIAGNOSIS — Z79899 Other long term (current) drug therapy: Secondary | ICD-10-CM | POA: Diagnosis not present

## 2021-04-12 DIAGNOSIS — K635 Polyp of colon: Secondary | ICD-10-CM | POA: Diagnosis not present

## 2021-04-12 DIAGNOSIS — D123 Benign neoplasm of transverse colon: Secondary | ICD-10-CM | POA: Diagnosis not present

## 2021-04-12 DIAGNOSIS — R1010 Upper abdominal pain, unspecified: Secondary | ICD-10-CM | POA: Diagnosis not present

## 2021-04-12 DIAGNOSIS — Z800 Family history of malignant neoplasm of digestive organs: Secondary | ICD-10-CM | POA: Diagnosis not present

## 2021-04-12 DIAGNOSIS — K5900 Constipation, unspecified: Secondary | ICD-10-CM | POA: Diagnosis not present

## 2021-04-15 ENCOUNTER — Ambulatory Visit (INDEPENDENT_AMBULATORY_CARE_PROVIDER_SITE_OTHER): Payer: Medicare Other | Admitting: Podiatry

## 2021-04-15 ENCOUNTER — Encounter (HOSPITAL_COMMUNITY): Payer: Self-pay

## 2021-04-15 ENCOUNTER — Emergency Department (HOSPITAL_COMMUNITY): Payer: Medicare Other

## 2021-04-15 ENCOUNTER — Emergency Department (HOSPITAL_COMMUNITY)
Admission: EM | Admit: 2021-04-15 | Discharge: 2021-04-16 | Disposition: A | Discharge status: Home / Self Care | Payer: Medicare Other | Attending: Emergency Medicine | Admitting: Emergency Medicine

## 2021-04-15 ENCOUNTER — Other Ambulatory Visit: Payer: Self-pay

## 2021-04-15 DIAGNOSIS — I776 Arteritis, unspecified: Secondary | ICD-10-CM

## 2021-04-15 DIAGNOSIS — Z5321 Procedure and treatment not carried out due to patient leaving prior to being seen by health care provider: Secondary | ICD-10-CM | POA: Insufficient documentation

## 2021-04-15 DIAGNOSIS — M79602 Pain in left arm: Secondary | ICD-10-CM | POA: Diagnosis not present

## 2021-04-15 DIAGNOSIS — I7300 Raynaud's syndrome without gangrene: Secondary | ICD-10-CM

## 2021-04-15 DIAGNOSIS — R079 Chest pain, unspecified: Secondary | ICD-10-CM | POA: Insufficient documentation

## 2021-04-15 DIAGNOSIS — R0789 Other chest pain: Secondary | ICD-10-CM | POA: Diagnosis not present

## 2021-04-15 DIAGNOSIS — M542 Cervicalgia: Secondary | ICD-10-CM | POA: Diagnosis not present

## 2021-04-15 DIAGNOSIS — L603 Nail dystrophy: Secondary | ICD-10-CM | POA: Diagnosis not present

## 2021-04-15 LAB — TROPONIN I (HIGH SENSITIVITY)
Troponin I (High Sensitivity): <2 ng/L (ref <18)
Troponin I (High Sensitivity): <2 ng/L (ref <18)

## 2021-04-15 LAB — CBC
HCT: 37.6 % (ref 36.0–46.0)
Hemoglobin: 12.6 g/dL (ref 12.0–15.0)
MCH: 32.1 pg (ref 26.0–34.0)
MCHC: 33.5 g/dL (ref 30.0–36.0)
MCV: 95.7 fL (ref 80.0–100.0)
Platelets: 346 10*3/uL (ref 150–400)
RBC: 3.93 MIL/uL (ref 3.87–5.11)
RDW: 12.1 % (ref 11.5–15.5)
WBC: 9.9 10*3/uL (ref 4.0–10.5)
nRBC: 0 % (ref 0.0–0.2)

## 2021-04-15 LAB — BASIC METABOLIC PANEL
Anion gap: 9 (ref 5–15)
BUN: 11 mg/dL (ref 8–23)
CO2: 23 mmol/L (ref 22–32)
Calcium: 8.5 mg/dL — ABNORMAL LOW (ref 8.9–10.3)
Chloride: 95 mmol/L — ABNORMAL LOW (ref 98–111)
Creatinine, Ser: 0.82 mg/dL (ref 0.44–1.00)
GFR, Estimated: >60 mL/min (ref >60)
Glucose, Bld: 114 mg/dL — ABNORMAL HIGH (ref 70–99)
Potassium: 4 mmol/L (ref 3.5–5.1)
Sodium: 127 mmol/L — ABNORMAL LOW (ref 135–145)

## 2021-04-15 MED ORDER — BETAMETHASONE DIPROPIONATE 0.05 % EX CREA: TOPICAL_CREAM | Freq: Two times a day (BID) | CUTANEOUS | 1 refills | Status: DC

## 2021-04-15 NOTE — ED Provider Notes (Signed)
Emergency Medicine Provider Triage Evaluation Note  Courtney [name] , a 70 y.o. female  was evaluated in triage.  Pt complains of chest pain, left neck and shoulder pain starting acutely about 20 minutes ago.  Patient was at [facility] mailing some letters when the pain started.  It rapidly became worse.  She has associated shortness of breath.  No diaphoresis or vomiting.  Pain is worse when she turns her neck.  No cardiac history but strong family history of heart disease.  Negative stress test 2 years ago.  She had an endoscopy and colonoscopy on 04/12/2021.  Review of Systems  Positive: Chest pain, neck pain Negative: Vomiting  Physical Exam  BP 118/76 (BP Location: Left Arm)    Pulse 93    Resp 17    LMP 01/08/1999    SpO2 95%  Gen:   Awake, looks uncomfortable          Resp:  Normal effort, globally decreased breath sounds             MSK:   Moves extremities without difficulty  Other:    Medical Decision Making  Medically screening exam initiated at 4:35 PM.  Appropriate orders placed.  Secret Andres-Gregg was informed that the remainder of the evaluation will be completed by another provider, this initial triage assessment does not replace that evaluation, and the importance of remaining in the ED until their evaluation is complete.     Carlisle Cater, PA-C 04/15/21 1636    Charlesetta Shanks, MD 04/19/21 2105

## 2021-04-15 NOTE — ED Triage Notes (Signed)
Patient states tht she was mailing Christmas cards and began having left arm pain that radiates up into her left neck and chest approx 10 minutes ago.

## 2021-04-16 ENCOUNTER — Telehealth: Payer: Self-pay | Admitting: Interventional Cardiology

## 2021-04-16 ENCOUNTER — Telehealth: Payer: Self-pay | Admitting: Internal Medicine

## 2021-04-16 NOTE — Telephone Encounter (Signed)
New Message:    Patient went to [facility] ER on yesterday(04-15-21) for Chest Pain. She said she stayed for 7 hours and she did not see a Provider. She did have blood work, but left after not being seen by anybody, She does not know what to do, She is suppose to drive out of town tomorrow.  Pt c/o of Chest Pain: STAT if CP now or developed within 24 hours  1. Are you having CP right now? Not having chest pain, but  terrible cramping  in chest and Torso  2. Are you experiencing any other symptoms (ex. SOB, nausea, vomiting, sweating)? Short of breath  3. How long have you been experiencing CP? yesterdayt  4. Is your CP continuous or coming and going? constant  5. Have you taken Nitroglycerin? no ?

## 2021-04-16 NOTE — Telephone Encounter (Signed)
I do not think this is cardiac related.  She needs to see primary care.  I reviewed data from the emergency room.  EKG and labs look okay.  ----- Message -----  From: Rodman Key, RN  Sent: 04/16/2021  12:20 PM EST  To: Belva Crome, MD, Loren Racer, RN    Spoke with pt and made her aware of info from Dr. Tamala Julian.  Pt agreeable to reach out to PCP.  Pt appreciative for call.

## 2021-04-16 NOTE — Telephone Encounter (Signed)
Call transferred to triage  Yesterday started getting severe pain in left neck chest and shoulder, drove herself to [facility].  She had labs, ekg and cxr and waited about 6 hrs.  As sitting in waiting, symptoms worsened to a "cramping, spasming pain in torso and chest".  Coughing and yawning made this worse.  She had her husband take her home because she needed to lie down so badly.  Its much better now than yesterday but still present today. Cramped torso and restricted breathing and an incredible sense of fatigue.   Feels like been hit by a truck.    She is planning to leave to go out of town tomorrow and doesn't feel like she can even pack.  No chills or SOB. Did not take anything to try to relieve symptoms.    Had endoscopy and endoscopy on Friday last week.  She called them yesterday from [facility] waiting room.  They didn't feel like it had to do with those procedures.  She felt fine Sat Sun.    I adv her that I would forward to Dr. Tamala Julian to let him know but that she should contact her PCP office for follow up as her ekg and labs including troponins were normal yesterday.

## 2021-04-16 NOTE — Telephone Encounter (Signed)
Patient requesting a call back to discuss the reason she decided to leave the ED prior to being seen on 04-15-2021  Patient would not got into further details

## 2021-04-16 NOTE — Progress Notes (Signed)
°  Subjective:  Patient ID: Courtney [name], female    DOB: [birth year],  MRN: [id number]  Chief Complaint  Patient presents with   Toe Injury      NP  Covid Toe     70 y.o. female presents with the above complaint. History confirmed with patient.  She has had pain in her toes and skin changes with peeling and ulceration in toes of both feet on and off over the last several months.  She has a number of fissures that show this as well as what appears to be cyanosis of the plantar forefoot and digital pulps  Objective:  Physical Exam: Foot itself is warm and has good cap fill time of the midfoot, more sluggish and cooler to touch the tips of the toes, left second toe has blistering extending into the nailbed normal sensory exam, palpable DP and PT pulse Assessment:   1. 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  I reviewed the photograph she has as well as the clinical exam findings with her that I see today.  To me this appears to be most likely consistent with a Raynaud's phenomenon or disease with ulceration or possibly small vessel vasculitis.  She does have a history of rheumatoid arthritis.  I have placed referral to vascular surgery as well as rheumatology to evaluate this.  We discussed treating with a topical steroid although I think likely she will need systemic treatment but I will defer this to the rheumatologist or her vascular doctor treating her regarding systemic medications.  I gave her a prescription for Diprolene ointment and she will try this to see if it alleviates her symptoms..   We also discussed thickening of the nail plates which to me appears to be nail dystrophy.  Does not appear to be mycotic.  Discussed that she likely would not qualify for routine preventative nail care but we would be happy to do it at any point but she may have to pay out of pocket for  it.   Return if symptoms worsen or fail to improve.

## 2021-04-17 ENCOUNTER — Emergency Department (HOSPITAL_BASED_OUTPATIENT_CLINIC_OR_DEPARTMENT_OTHER): Payer: Medicare Other

## 2021-04-17 ENCOUNTER — Emergency Department (HOSPITAL_BASED_OUTPATIENT_CLINIC_OR_DEPARTMENT_OTHER)
Admission: EM | Admit: 2021-04-17 | Discharge: 2021-04-18 | Payer: Medicare Other | Attending: Emergency Medicine | Admitting: Emergency Medicine

## 2021-04-17 ENCOUNTER — Other Ambulatory Visit: Payer: Self-pay

## 2021-04-17 ENCOUNTER — Ambulatory Visit (INDEPENDENT_AMBULATORY_CARE_PROVIDER_SITE_OTHER): Payer: Medicare Other

## 2021-04-17 ENCOUNTER — Encounter (HOSPITAL_BASED_OUTPATIENT_CLINIC_OR_DEPARTMENT_OTHER): Payer: Self-pay

## 2021-04-17 DIAGNOSIS — Z96652 Presence of left artificial knee joint: Secondary | ICD-10-CM | POA: Insufficient documentation

## 2021-04-17 DIAGNOSIS — R0789 Other chest pain: Secondary | ICD-10-CM | POA: Diagnosis not present

## 2021-04-17 DIAGNOSIS — R079 Chest pain, unspecified: Secondary | ICD-10-CM | POA: Diagnosis not present

## 2021-04-17 DIAGNOSIS — Z96643 Presence of artificial hip joint, bilateral: Secondary | ICD-10-CM | POA: Insufficient documentation

## 2021-04-17 DIAGNOSIS — D735 Infarction of spleen: Secondary | ICD-10-CM

## 2021-04-17 DIAGNOSIS — R1084 Generalized abdominal pain: Secondary | ICD-10-CM

## 2021-04-17 DIAGNOSIS — I2510 Atherosclerotic heart disease of native coronary artery without angina pectoris: Secondary | ICD-10-CM | POA: Insufficient documentation

## 2021-04-17 DIAGNOSIS — R1012 Left upper quadrant pain: Secondary | ICD-10-CM | POA: Insufficient documentation

## 2021-04-17 DIAGNOSIS — Z96611 Presence of right artificial shoulder joint: Secondary | ICD-10-CM | POA: Insufficient documentation

## 2021-04-17 DIAGNOSIS — R109 Unspecified abdominal pain: Secondary | ICD-10-CM | POA: Diagnosis not present

## 2021-04-17 DIAGNOSIS — K661 Hemoperitoneum: Secondary | ICD-10-CM | POA: Insufficient documentation

## 2021-04-17 DIAGNOSIS — Z20822 Contact with and (suspected) exposure to covid-19: Secondary | ICD-10-CM | POA: Diagnosis not present

## 2021-04-17 DIAGNOSIS — J9811 Atelectasis: Secondary | ICD-10-CM | POA: Diagnosis not present

## 2021-04-17 LAB — CBC
HCT: 33.4 % — ABNORMAL LOW (ref 36.0–46.0)
Hemoglobin: 11.1 g/dL — ABNORMAL LOW (ref 12.0–15.0)
MCH: 31.4 pg (ref 26.0–34.0)
MCHC: 33.2 g/dL (ref 30.0–36.0)
MCV: 94.6 fL (ref 80.0–100.0)
Platelets: 318 10*3/uL (ref 150–400)
RBC: 3.53 MIL/uL — ABNORMAL LOW (ref 3.87–5.11)
RDW: 12.5 % (ref 11.5–15.5)
WBC: 5 10*3/uL (ref 4.0–10.5)
nRBC: 0 % (ref 0.0–0.2)

## 2021-04-17 LAB — TROPONIN I (HIGH SENSITIVITY): Troponin I (High Sensitivity): 2 ng/L (ref <18)

## 2021-04-17 LAB — BASIC METABOLIC PANEL
Anion gap: 7 (ref 5–15)
BUN: 15 mg/dL (ref 8–23)
CO2: 26 mmol/L (ref 22–32)
Calcium: 9 mg/dL (ref 8.9–10.3)
Chloride: 97 mmol/L — ABNORMAL LOW (ref 98–111)
Creatinine, Ser: 1 mg/dL (ref 0.44–1.00)
GFR, Estimated: 60 mL/min — ABNORMAL LOW (ref >60)
Glucose, Bld: 92 mg/dL (ref 70–99)
Potassium: 3.8 mmol/L (ref 3.5–5.1)
Sodium: 130 mmol/L — ABNORMAL LOW (ref 135–145)

## 2021-04-17 LAB — RESP PANEL BY RT-PCR (FLU A&B, COVID) ARPGX2
Influenza A by PCR: NEGATIVE
Influenza B by PCR: NEGATIVE
SARS Coronavirus 2 by RT PCR: NEGATIVE

## 2021-04-17 MED ORDER — IOHEXOL 350 MG/ML SOLN
100.0000 mL | Freq: Once | INTRAVENOUS | Status: AC | PRN
  Administered 2021-04-17: 19:00:00 75 mL via INTRAVENOUS

## 2021-04-17 MED ORDER — FENTANYL CITRATE PF 50 MCG/ML IJ SOSY
50.0000 ug | PREFILLED_SYRINGE | Freq: Once | INTRAMUSCULAR | Status: AC
  Administered 2021-04-17: 50 ug via INTRAVENOUS
  Filled 2021-04-17: qty 1

## 2021-04-17 NOTE — ED Provider Notes (Signed)
Care assumed from [place], [place].  At time of transfer care, patient is awaiting results of CT abdomen and pelvis and PE study to evaluate for complication from recent colonoscopy.  According to patient, she had endoscopy/colonoscopy 6 days ago on 04/12/21 at [facility] with Dr. Jerene Pitch with GI.  She says that she is not having pain for the last few days and was convinced that something was wrong.  Patient CT scan returned showing evidence of a grade 3 splenic injury with subcapsular hematoma and some blood in the pelvis.  No evidence of acute perforation with no free air.  No evidence of pulmonary embolism at this time.  She has some atelectasis but otherwise no convincing evidence of pneumonia.  Patient's labs show she is negative for COVID and flu.  Hemoglobin is slightly more decreased at 11.1 down from 12.6 two days ago, approximately 1.5 decrease in 2 days.  Otherwise creatinine is not elevated and troponin is negative.  As her surgery was done at [facility], will call general surgery at [facility] to discuss management.  Given her discomfort, will discuss a plan of management either with close follow-up or transfer for evaluation tonight.  Initially spoke to Dr. Eilleen Kempf with general surgery and he does feel that is reasonable to speak with GI to talk about admission.  If patient is admitted, they will be happy to see the patient in consultation.  Subsequently spoke to Dr. Nyoka Cowden with GI who does feel patient needs admission for evaluation as this was a recent procedural patient of theirs with the downtrending hemoglobin and pain with a hematoma in the subcapsular spleen and abdomen.  They did feel that internal medicine should admit.  Spoke to Dr.  Nonie Hoyer with internal medicine who agrees with admission and they will see the patient for further management.  Patient will be admitted to [facility] health for further management of subcapsular splenic hematoma with injury and  some hemoperitoneum with abdominal pain.   Clinical Impression: 1. Subcapsular hemorrhage of spleen   2. Left upper quadrant abdominal pain   3. Hemoperitoneum     Disposition: Admit to [facility]  This note was prepared with assistance of Dragon voice recognition software. Occasional wrong-word or sound-a-like substitutions may have occurred due to the inherent limitations of voice recognition software.     Arno Cullers, Gwenyth Allegra, MD 04/17/21 [phone]

## 2021-04-17 NOTE — ED Triage Notes (Addendum)
Pt states that she had endoscopy and colonoscopy on Friday then developed pain on Monday in left chest and neck, went to [facility] had EKG, Chest XR, and labs was having spasms in her chest with SOB. States that she called her doctor and was advised to come back to ED. Her doctor would like her to have a CT scan to look for internal damage from scope.

## 2021-04-17 NOTE — ED Provider Notes (Signed)
[facility] EMERGENCY DEPARTMENT Provider Note   CSN: [id number] Arrival date & time: 04/17/21  1749     History Chief Complaint  Patient presents with   Chest Pain    Courtney [name] is a 70 y.o. female who presents to the ED today with complaint of gradual onset, constant, sharp, left sided chest pain/abdominal pain/back pain that began 3 days ago.  Patient reports that on 12/16 she underwent an endoscopy and colonoscopy.  She felt fatigued the next day however otherwise felt fine.  On Monday she began having the pain and went to Emergency department.  She had labs, EKG, chest x-ray done however left prior to being seen due to prolonged wait times.  She consulted her cardiologist who evaluated the negative troponin x2, chest x-ray, EKG and did not feel that it was cardiac related.  She also consulted her gastroenterologist who did not feel that it was related to EGD/colonoscopy and advised that she follow-up with her PCP.  Her PCP ordered repeat chest x-ray and abdominal x-ray today without any signs of perforation however advised that she come to the ED for further evaluation as she could have a " small laceration" needs further evaluation by CT scan.  Patient is coughing and states that that is new.  She denies any fevers or chills or body aches.  She has been eating and drinking without difficulty.  No coughing up/vomiting of blood.  No bloody stools.   The history is provided by the patient and medical records.      Past Medical History:  Diagnosis Date   ADD (attention deficit disorder)    Anxiety    Celiac disease    possible vs IBS   Complication of anesthesia    Coronary artery disease    Depression    no bipolar per Dr. [name]   GI problem    Olevia Perches   Gluten intolerance    Hyperlipidemia    IBS (irritable bowel syndrome)    Osteoarthritis    Osteoporosis    PONV (postoperative nausea and vomiting)    Rheumatoid arthritis ([place])    Seasonal allergies  01-08-12   hx. of multiple bronchitis related to this.   Shoulder pain, right     Patient Active Problem List   Diagnosis Date Noted   Hyponatremia 03/06/2021   Menopausal disorder 03/06/2021   Nausea & vomiting 02/27/2021   Generalized abdominal pain 02/27/2021   Primary osteoarthritis of left knee 07/16/2020   Vasculitis ([place]) 07/04/2020   RA (rheumatoid arthritis) ([place]) 07/04/2020   Preop exam for internal medicine 06/03/2020   Menopause 05/31/2020   Allergic contact dermatitis 05/28/2020   Degenerative disc disease, cervical 06/15/2019   Acquired leg length discrepancy 06/15/2019   Leg pain 06/14/2019   Toe ulcer ([place]) 06/14/2019   Mixed hyperlipidemia 02/04/2019   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   Spinal stenosis, lumbar region, 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   Sciatica of right side associated with disorder of lumbar spine 02/26/2017   CAD in native artery 12/23/2016   Abdominal pain, epigastric 05/09/2015   Dry skin dermatitis 08/04/2012   Constipation - functional 08/04/2012   History of hypothyroidism 08/04/2012   Otitis media, serous 12/25/2010   OSTEOARTHRITIS, HIPS, BILATERAL 06/20/2010   Anxiety state 05/03/2009   TOBACCO USE, QUIT 05/03/2009   ONYCHOMYCOSIS 06/28/2008   Actinic keratosis 06/28/2008   Bilateral carotid bruits 06/28/2008   Attention deficit disorder 05/17/2007   Irritable bowel syndrome 05/17/2007   Dyslipidemia 02/28/2007  Depression 02/28/2007   OA (osteoarthritis) of knee 02/28/2007    Past Surgical History:  Procedure Laterality Date   EXPLORATORY LAPAROTOMY  01-08-12   due to endometriosis-portions of both ovaries removed   TONSILLECTOMY     TOTAL HIP ARTHROPLASTY     right. 2002   TOTAL HIP ARTHROPLASTY  01/14/2012   Procedure: TOTAL HIP ARTHROPLASTY;  Surgeon: Gearlean Alf, MD;  Location: [facility] ORS;  Service: Orthopedics;  Laterality: Left;   TOTAL KNEE ARTHROPLASTY Left 07/16/2020   Procedure: TOTAL KNEE  ARTHROPLASTY;  Surgeon: Gaynelle Arabian, MD;  Location: [facility] OR[facility];  Service: Orthopedics;  Laterality: Left;  3min   TOTAL SHOULDER ARTHROPLASTY Right    VENTRAL HERNIA REPAIR  01-08-12   abdominal     OB History   No obstetric history on file.     Family History  Problem Relation Age of Onset   Heart disease Mother    Heart attack Mother    Prostate cancer Father    Coronary artery disease Other        FH Female 1st degree relative <60   ADD / ADHD Other     Social History   Tobacco Use   Smoking status: Never   Smokeless tobacco: Never   Tobacco comments:    only social  Vaping Use   Vaping Use: Never used  Substance Use Topics   Alcohol use: Yes    Alcohol/week: 0.0 standard drinks    Comment: rare social    Drug use: No    Home Medications Prior to Admission medications   Medication Sig Start Date End Date Taking? Authorizing Provider  amphetamine-dextroamphetamine (ADDERALL) 10 MG tablet Take 1 tablet (10 mg total) by mouth 2 (two) times daily with a meal. 10/02/20   Cottle, Billey Co., MD  betamethasone dipropionate 0.05 % cream Apply topically 2 (two) times daily. 04/15/21   McDonald, Stephan Minister, DPM  Calcium Carb-Cholecalciferol (CALCIUM 600+D3 PO) Take 1,200 tablets by mouth daily.  Patient not taking: Reported on 03/28/2021    [provider]  Cholecalciferol (VITAMIN D3) 125 MCG (5000 UT) CAPS See admin instructions. Take 1 by mouth as directed Patient not taking: Reported on 03/28/2021    [provider]  estradiol (ESTRACE) 1 MG tablet Take 1.5 tablets (1.5 mg total) by mouth daily. 03/06/21   Plotnikov, Evie Lacks, MD  imipramine (TOFRANIL) 50 MG tablet Take 2 tablets (100 mg total) by mouth at bedtime. 03/28/21   Cottle, Billey Co., MD  LORazepam (ATIVAN) 1 MG tablet Take 1 tablet (1 mg total) by mouth every 8 (eight) hours. 03/28/21   Cottle, Billey Co., MD  meloxicam (MOBIC) 7.5 MG tablet Take 1-2 tablets (7.5-15 mg total) by mouth daily as  needed for pain. 03/06/21   Plotnikov, Evie Lacks, MD  methocarbamol (ROBAXIN) 500 MG tablet Take 1 tablet (500 mg total) by mouth every 6 (six) hours as needed for muscle spasms. 07/17/20   Edmisten, Ok Anis, PA  nitroGLYCERIN (NITROSTAT) 0.4 MG SL tablet Place 1 tablet (0.4 mg total) under the tongue every 5 (five) minutes as needed for chest pain. 06/14/18 06/22/19  Tommie Raymond, NP  NP THYROID 60 MG tablet Take 1 tablet (60 mg total) by mouth daily before breakfast. 03/06/21   Plotnikov, Evie Lacks, MD  omega-3 acid ethyl esters (LOVAZA) 1 g capsule Take 2 capsules (2 g total) by mouth 2 (two) times daily. 09/14/19   Plotnikov, Evie Lacks, MD  ondansetron [employer])  4 MG tablet Take 1 tablet (4 mg total) by mouth every 8 (eight) hours as needed for nausea or vomiting. 02/21/21   Biagio Borg, MD  pantoprazole (PROTONIX) 40 MG tablet Take 1 tablet (40 mg total) by mouth daily. 03/06/21   Plotnikov, Evie Lacks, MD  pravastatin (PRAVACHOL) 20 MG tablet Take one tablet by mouth 4 days per week 10/22/20   Belva Crome, MD  pregabalin (LYRICA) 75 MG capsule Take 75 mg by mouth 2 (two) times daily. Patient not taking: Reported on 03/28/2021 04/30/20   [provider]  progesterone (PROMETRIUM) 200 MG capsule Take 2 capsules (400 mg total) by mouth at bedtime. 03/06/21   Plotnikov, Evie Lacks, MD  propranolol (INDERAL) 10 MG tablet TAKE 1 TO 3 TABLETS BY MOUTH TWICE DAILY AS NEEDED FOR ANXIETY Patient not taking: Reported on 03/28/2021 12/19/20   Purnell Shoemaker., MD  Testosterone 1.62 % GEL Place 5 mg onto the skin daily. 10/03/20   Hurshel Party C, MD  traMADol (ULTRAM) 50 MG tablet TAKE 1/2 TO 1 TABLET(25 TO 50 MG) BY MOUTH BACK EVERY 8 HOURS AS NEEDED FOR SEVERE PAIN 07/11/20   Binnie Rail, MD  vitamin E 400 UNIT capsule Take 800 Units by mouth daily.     [provider]    Allergies    Clarithromycin, Covid-19 (mrna) vaccine, Other, Bactrim [sulfamethoxazole-trimethoprim], Morphine,  Morphine and related, Azithromycin, Ceclor [cefaclor], Codeine, Cymbalta [duloxetine hcl], Erythromycin, Erythromycin base, Levofloxacin, Loratadine, Nitrofurantoin, Oxycodone, Penicillins, Propoxyphene, and Propoxyphene n-acetaminophen  Review of Systems   Review of Systems  Constitutional:  Negative for chills and fever.  Respiratory:  Positive for cough. Negative for shortness of breath.   Cardiovascular:  Positive for chest pain.  Gastrointestinal:  Positive for abdominal distention. Negative for blood in stool, constipation, diarrhea, nausea and vomiting.  Musculoskeletal:  Positive for back pain. Negative for myalgias.  All other systems reviewed and are negative.  Physical Exam Updated Vital Signs BP (!) 142/86 (BP Location: Left Arm)    Pulse 91    Temp 98.2 F (36.8 C) (Oral)    Resp 20    Ht 5' (1.524 m)    Wt 52.6 kg    LMP 01/08/1999    SpO2 100%    BMI 22.65 kg/m   Physical Exam Vitals and nursing note reviewed.  Constitutional:      Appearance: She is not ill-appearing or diaphoretic.  HENT:     Head: Normocephalic and atraumatic.  Eyes:     Conjunctiva/sclera: Conjunctivae normal.  Cardiovascular:     Rate and Rhythm: Normal rate and regular rhythm.     Pulses:          Radial pulses are 2+ on the right side and 2+ on the left side.       Dorsalis pedis pulses are 2+ on the right side and 2+ on the left side.     Heart sounds: Normal heart sounds.  Pulmonary:     Effort: Pulmonary effort is normal.     Breath sounds: Normal breath sounds. No decreased breath sounds, wheezing, rhonchi or rales.  Chest:     Chest wall: No tenderness.  Abdominal:     Palpations: Abdomen is soft.     Tenderness: There is abdominal tenderness. There is no guarding or rebound.  Musculoskeletal:     Cervical back: Neck supple.  Skin:    General: Skin is warm and dry.  Neurological:     Mental  Status: She is alert.    ED Results / Procedures / Treatments   Labs (all labs  ordered are listed, but only abnormal results are displayed) Labs Reviewed  BASIC METABOLIC PANEL - Abnormal; Notable for the following components:      Result Value   Sodium 130 (*)    Chloride 97 (*)    GFR, Estimated 60 (*)    All other components within normal limits  CBC - Abnormal; Notable for the following components:   RBC 3.53 (*)    Hemoglobin 11.1 (*)    HCT 33.4 (*)    All other components within normal limits  RESP PANEL BY RT-PCR (FLU A&B, COVID) [id number]  TROPONIN I (HIGH SENSITIVITY)    EKG EKG Interpretation  Date/Time:  Wednesday April 17 2021 18:02:09 EST Ventricular Rate:  88 PR Interval:  182 QRS Duration: 68 QT Interval:  330 QTC Calculation: 399 R Axis:   65 Text Interpretation: Normal sinus rhythm Anteroseptal infarct , age undetermined Abnormal ECG When compared to prior, less wandering baseline. No STEMI Confirmed by Antony Blackbird [phone]) on 04/17/2021 6:24:56 PM  Radiology DG Chest 2 View  Result Date: 04/17/2021 CLINICAL DATA:  Chest and abdominal pain for 2 days, had endoscopy and colonoscopy 5 days ago EXAM: CHEST - 2 VIEW COMPARISON:  04/15/2021 FINDINGS: Normal heart size, mediastinal contours, and pulmonary vascularity. Calcified tortuous thoracic aorta. Linear subsegmental atelectasis RIGHT base. Lungs otherwise clear. No pulmonary infiltrate, pleural effusion, or pneumothorax. RIGHT glenohumeral degenerative changes. Scattered degenerative disc disease changes thoracic spine. IMPRESSION: Subsegmental atelectasis at RIGHT base. Aortic Atherosclerosis (ICD10-I70.0). Electronically Signed   By: Lavonia Dana M.D.   On: 04/17/2021 16:46   CT Angio Chest PE W/Cm &/Or Wo Cm  Result Date: 04/17/2021 CLINICAL DATA:  Left-sided chest and neck pain, status post recent endoscopy and colonoscopy. EXAM: CT ANGIOGRAPHY CHEST WITH CONTRAST TECHNIQUE: Multidetector CT imaging of the chest was performed using the standard protocol during bolus administration  of intravenous contrast. Multiplanar CT image reconstructions and MIPs were obtained to evaluate the vascular anatomy. CONTRAST:  8mL OMNIPAQUE IOHEXOL 350 MG/ML SOLN COMPARISON:  June 13, 2015 FINDINGS: Cardiovascular: Mild calcification of the aortic arch is seen, without evidence of aortic aneurysm or dissection. Satisfactory opacification of the pulmonary arteries to the segmental level. No evidence of pulmonary embolism. Normal heart size. No pericardial effusion. Mediastinum/Nodes: No enlarged mediastinal, hilar, or axillary lymph nodes. Thyroid gland, trachea, and esophagus demonstrate no significant findings. Lungs/Pleura: Mild atelectasis is seen within the posterior aspects of the bilateral lower lobes. There is no evidence of an acute infiltrate, pleural effusion or pneumothorax. Upper Abdomen: A mild-to-moderate amount of perisplenic fluid is seen. Musculoskeletal: No chest wall abnormality. Chronic and degenerative changes seen involving the right humeral head. Review of the MIP images confirms the above findings. IMPRESSION: 1. No CT evidence of pulmonary embolism. 2. Mild bilateral lower lobe atelectasis. 3. Mild-to-moderate amount of perisplenic fluid. Correlation with abdomen pelvis CT is recommended to exclude the presence of an underlying inflammatory process. 4. Aortic atherosclerosis. Aortic Atherosclerosis (ICD10-I70.0). Electronically Signed   By: Virgina Norfolk M.D.   On: 04/17/2021 19:45   DG Abd 2 Views  Result Date: 04/17/2021 CLINICAL DATA:  Chest and abdominal pain EXAM: ABDOMEN - 2 VIEW COMPARISON:  None. FINDINGS: The bowel gas pattern is normal. Lung bases are clear. There is no evidence of free air. No radio-opaque calculi or other significant radiographic abnormality is seen. Bilateral hip arthroplasty with intact hardware.  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 Nonobstructive bowel gas pattern. Lung bases are clear. Electronically Signed   By: Keane Police D.O.   On: 04/17/2021 16:48    Procedures Procedures   Medications Ordered in ED Medications  iohexol (OMNIPAQUE) 350 MG/ML injection 100 mL (75 mLs Intravenous Contrast Given 04/17/21 1912)    ED Course  I have reviewed the triage vital signs and the nursing notes.  Pertinent labs & imaging results that were available during my care of the patient were reviewed by me and considered in my medical decision making (see chart for details).    MDM Rules/Calculators/A&P                          70 year old female presents to the ED today with complaint of chest pain, back pain, abdominal pain for the past 4 days.  Recently had an EGD and colonoscopy done and was sent here by PCP for further evaluation with concern for possible perforation.  On arrival to the ED vitals are stable.  Patient is resting comfortably at bedside.  She is afebrile, nontachycardic and nontachypneic and blood pressure 142/86.  She is noted to have some mild abdominal tenderness palpation as well as chest tenderness palpation.  She had abdominal and chest x-ray done earlier today without any signs of free air.  Do not feel she requires repeat chest x-ray at this time.  We will plan for labs as well as CT scan chest and abdomen however I have very low suspicion for perforation at this time.  Patient has been eating and drinking without difficulty over the past 4 days without any signs of hemoptysis, emesis, hematochezia.  No syncope.  Would suspect that her vitals would be significantly abnormal if there was any signs of perforation.  She has been coughing.  We will plan to test for COVID and flu at this time.   CBC without leukocytosis. Hgb 11.1  Hemoglobin  Date Value Ref Range Status  04/17/2021 11.1 (L) 12.0 - 15.0 g/dL Final  04/15/2021 12.6 12.0 - 15.0 g/dL Final  02/25/2021 13.5 12.0 - 15.0 g/dL Final   07/17/2020 9.7 (L) 12.0 - 15.0 g/dL Final   BMP with sodium 130. No other electrolyte abnormalities Troponin of 2. Do not feel she requires repeat troponin testing   CTA: IMPRESSION:  1. No CT evidence of pulmonary embolism.  2. Mild bilateral lower lobe atelectasis.  3. Mild-to-moderate amount of perisplenic fluid. Correlation with  abdomen pelvis CT is recommended to exclude the presence of an  underlying inflammatory process.  4. Aortic atherosclerosis.   At shift change case signed out to attending physician Dr. Sherry Ruffing who will follow up on CT A/P  This note was prepared using Dragon voice recognition software and may include unintentional dictation errors due to the inherent limitations of voice recognition software.     Final Clinical Impression(s) / ED Diagnoses Final diagnoses:  None    Rx / DC Orders ED Discharge Orders     None        Eustaquio Maize, PA-C 04/17/21 2000    Tegeler, Gwenyth Allegra, MD 04/18/21 1054

## 2021-04-17 NOTE — Progress Notes (Addendum)
Pt called and spoke with Verdis Frederickson, RMA and stated that she went to ER for CP and abd pain. Pt states she feel more like it in her chest because she is having shortness of breath and tightness. Pt stats she had a colonoscopy & endoscopy within past 24 hours. Pt spoke with her cardiologist and was told to reach out to PCP. Dr. Alain Marion told Verdis Frederickson to have pt come in for a STAT cxr & abd xray. Orders was placed..Johny Chess  5 pm: CXR, abd X ray were nl She needs to contact her GI doctor I  contacted pt via MyChart and called her: informed of test results. She will go back to ER if not well. Lew Dawes, MD

## 2021-04-17 NOTE — Telephone Encounter (Signed)
Pt called and spoke with Verdis Frederickson, RMA and stated that she went to ER for CP and abd pain. Pt states she feel more like it in her chest because she is having shortness of breath and tightness. Pt stats she had a colonoscopy & endoscopy within past 24 hours. Pt spoke with her cardiologist and was told to reach out to PCP. Dr. Alain Marion told Verdis Frederickson to have pt come in for a STAT cxr & abd xray. Orders was placed.Marland KitchenJohny Chess

## 2021-04-18 ENCOUNTER — Encounter: Payer: Self-pay | Admitting: Internal Medicine

## 2021-04-18 DIAGNOSIS — S36029A Unspecified contusion of spleen, initial encounter: Secondary | ICD-10-CM | POA: Diagnosis not present

## 2021-04-18 DIAGNOSIS — E785 Hyperlipidemia, unspecified: Secondary | ICD-10-CM | POA: Diagnosis not present

## 2021-04-18 DIAGNOSIS — D735 Infarction of spleen: Secondary | ICD-10-CM | POA: Diagnosis not present

## 2021-04-18 DIAGNOSIS — Z20822 Contact with and (suspected) exposure to covid-19: Secondary | ICD-10-CM | POA: Diagnosis not present

## 2021-04-18 DIAGNOSIS — K661 Hemoperitoneum: Secondary | ICD-10-CM | POA: Diagnosis not present

## 2021-04-18 DIAGNOSIS — Z96652 Presence of left artificial knee joint: Secondary | ICD-10-CM | POA: Diagnosis not present

## 2021-04-18 DIAGNOSIS — Z96611 Presence of right artificial shoulder joint: Secondary | ICD-10-CM | POA: Diagnosis not present

## 2021-04-18 DIAGNOSIS — M069 Rheumatoid arthritis, unspecified: Secondary | ICD-10-CM | POA: Diagnosis not present

## 2021-04-18 DIAGNOSIS — I2510 Atherosclerotic heart disease of native coronary artery without angina pectoris: Secondary | ICD-10-CM | POA: Diagnosis not present

## 2021-04-18 DIAGNOSIS — R1012 Left upper quadrant pain: Secondary | ICD-10-CM | POA: Diagnosis not present

## 2021-04-18 DIAGNOSIS — R079 Chest pain, unspecified: Secondary | ICD-10-CM | POA: Diagnosis present

## 2021-04-18 DIAGNOSIS — Z96643 Presence of artificial hip joint, bilateral: Secondary | ICD-10-CM | POA: Diagnosis not present

## 2021-04-18 DIAGNOSIS — S36031A Moderate laceration of spleen, initial encounter: Secondary | ICD-10-CM | POA: Diagnosis not present

## 2021-04-18 DIAGNOSIS — D7831 Postprocedural hematoma of the spleen following a procedure on the spleen: Secondary | ICD-10-CM | POA: Diagnosis not present

## 2021-04-18 DIAGNOSIS — F329 Major depressive disorder, single episode, unspecified: Secondary | ICD-10-CM | POA: Diagnosis not present

## 2021-04-18 DIAGNOSIS — S36032A Major laceration of spleen, initial encounter: Secondary | ICD-10-CM | POA: Diagnosis not present

## 2021-04-18 DIAGNOSIS — K219 Gastro-esophageal reflux disease without esophagitis: Secondary | ICD-10-CM | POA: Diagnosis not present

## 2021-04-22 ENCOUNTER — Encounter: Payer: Self-pay | Admitting: Interventional Cardiology

## 2021-04-23 ENCOUNTER — Telehealth: Payer: Self-pay | Admitting: Internal Medicine

## 2021-04-23 DIAGNOSIS — S36029D Unspecified contusion of spleen, subsequent encounter: Secondary | ICD-10-CM

## 2021-04-23 NOTE — Telephone Encounter (Signed)
Patient needs a Hospital follow up offered the patient 05/01/2021 at 1:20 pm. Patient would like to be seen sooner. Patient wants her hemoglobin levels checked. Patient has hemorrhage of spleen, and would like blood work done as soon as possible.

## 2021-04-24 DIAGNOSIS — R9431 Abnormal electrocardiogram [ECG] [EKG]: Secondary | ICD-10-CM | POA: Diagnosis not present

## 2021-04-24 DIAGNOSIS — D7831 Postprocedural hematoma of the spleen following a procedure on the spleen: Secondary | ICD-10-CM | POA: Diagnosis not present

## 2021-04-24 DIAGNOSIS — R1012 Left upper quadrant pain: Secondary | ICD-10-CM | POA: Diagnosis not present

## 2021-04-24 DIAGNOSIS — R110 Nausea: Secondary | ICD-10-CM | POA: Diagnosis not present

## 2021-04-24 DIAGNOSIS — R1011 Right upper quadrant pain: Secondary | ICD-10-CM | POA: Diagnosis not present

## 2021-04-24 DIAGNOSIS — R000 Tachycardia, unspecified: Secondary | ICD-10-CM | POA: Diagnosis not present

## 2021-04-24 DIAGNOSIS — S36029A Unspecified contusion of spleen, initial encounter: Secondary | ICD-10-CM | POA: Diagnosis not present

## 2021-04-24 DIAGNOSIS — K219 Gastro-esophageal reflux disease without esophagitis: Secondary | ICD-10-CM | POA: Diagnosis not present

## 2021-04-24 DIAGNOSIS — K828 Other specified diseases of gallbladder: Secondary | ICD-10-CM | POA: Diagnosis not present

## 2021-04-24 NOTE — Telephone Encounter (Signed)
Noted.  New admission summary reviewed.

## 2021-04-24 NOTE — Telephone Encounter (Addendum)
Coley can come for blood work sooner.  Labs orders are entered  thank you

## 2021-04-30 ENCOUNTER — Encounter: Payer: Self-pay | Admitting: Internal Medicine

## 2021-04-30 ENCOUNTER — Ambulatory Visit (INDEPENDENT_AMBULATORY_CARE_PROVIDER_SITE_OTHER): Payer: Medicare Other | Admitting: Internal Medicine

## 2021-04-30 ENCOUNTER — Other Ambulatory Visit: Payer: Self-pay | Admitting: Internal Medicine

## 2021-04-30 ENCOUNTER — Other Ambulatory Visit: Payer: Self-pay

## 2021-04-30 DIAGNOSIS — F439 Reaction to severe stress, unspecified: Secondary | ICD-10-CM

## 2021-04-30 DIAGNOSIS — S36029A Unspecified contusion of spleen, initial encounter: Secondary | ICD-10-CM

## 2021-04-30 DIAGNOSIS — N959 Unspecified menopausal and perimenopausal disorder: Secondary | ICD-10-CM | POA: Diagnosis not present

## 2021-04-30 DIAGNOSIS — R1084 Generalized abdominal pain: Secondary | ICD-10-CM | POA: Diagnosis not present

## 2021-04-30 DIAGNOSIS — Z87828 Personal history of other (healed) physical injury and trauma: Secondary | ICD-10-CM | POA: Diagnosis not present

## 2021-04-30 DIAGNOSIS — Z9889 Other specified postprocedural states: Secondary | ICD-10-CM | POA: Diagnosis not present

## 2021-04-30 DIAGNOSIS — Z780 Asymptomatic menopausal state: Secondary | ICD-10-CM

## 2021-04-30 DIAGNOSIS — F419 Anxiety disorder, unspecified: Secondary | ICD-10-CM

## 2021-04-30 DIAGNOSIS — S36029D Unspecified contusion of spleen, subsequent encounter: Secondary | ICD-10-CM | POA: Insufficient documentation

## 2021-04-30 DIAGNOSIS — R112 Nausea with vomiting, unspecified: Secondary | ICD-10-CM | POA: Diagnosis not present

## 2021-04-30 DIAGNOSIS — S36039D Unspecified laceration of spleen, subsequent encounter: Secondary | ICD-10-CM | POA: Diagnosis not present

## 2021-04-30 DIAGNOSIS — Z09 Encounter for follow-up examination after completed treatment for conditions other than malignant neoplasm: Secondary | ICD-10-CM | POA: Diagnosis not present

## 2021-04-30 MED ORDER — TESTOSTERONE 1.62 % TD GEL: 5.0000 mg | Freq: Every day | TRANSDERMAL | 3 refills | Status: AC

## 2021-04-30 MED ORDER — ESTRADIOL 1 MG PO TABS: 1.5000 mg | ORAL_TABLET | Freq: Every day | ORAL | 1 refills | Status: DC

## 2021-04-30 MED ORDER — PROGESTERONE 200 MG PO CAPS: 400.0000 mg | ORAL_CAPSULE | Freq: Every evening | ORAL | 1 refills | Status: DC

## 2021-04-30 MED ORDER — NP THYROID 60 MG PO TABS: 60.0000 mg | ORAL_TABLET | Freq: Every day | ORAL | 1 refills | Status: DC

## 2021-04-30 NOTE — Assessment & Plan Note (Signed)
We discussed bioidentical hormone replacement concept - I do not do it.  I will renew her hormones for a month or two again.  We will have to find another physician who does identical hormone replacement treatments - Alexander T. Augoustides, MD - [facility] integrative medicine.

## 2021-04-30 NOTE — Assessment & Plan Note (Signed)
Courtney [name] may need a HIDA scan

## 2021-04-30 NOTE — Assessment & Plan Note (Addendum)
New splenic subcapsular hematoma compatible with grade 3 splenic injury s/p embolization with IR (12/22) - ?post-colonoscopy CBC today at [facility]

## 2021-04-30 NOTE — Assessment & Plan Note (Addendum)
Dr Anastasio Champion was prescribing hormones: testosterone, progesterone, estrogen, thyroid. Courtney [name] will need to see Dr Hal Neer for HRT We discussed bioidentical hormone replacement concept - I do not do it.  I will renew her hormones for a month or two again.  We will have to find another physician who does identical hormone replacement treatments - Alexander T. Augoustides, MD - [facility] integrative medicine. I renewed her Rx temporarily

## 2021-04-30 NOTE — Patient Instructions (Signed)
For a mild COVID-19 case - take zinc 50 mg a day for 1 week, vitamin C 1000 mg daily for 1 week, vitamin D2 50,000 units weekly for 2 months (unless  taking vitamin D daily already), an antioxidant Quercetin 500 mg twice a day for 1 week (if you can get it quick enough). Take Allegra or Benadryl.  Maintain good oral hydration and take Tylenol for high fever.  Call if problems. Isolate for 5 days, then wear a mask for 5 days per CDC.  

## 2021-04-30 NOTE — Assessment & Plan Note (Signed)
Worse due to stress. Just sold her house in Dec 2022

## 2021-04-30 NOTE — Progress Notes (Signed)
Subjective:  Patient ID: Damaris Hippo, female    DOB: 03-19-1951  Age: 71 y.o. MRN: [id number]  CC: Follow-up (Hosp f/u)   HPI Rosmary Andres-Gregg presents for splenic subcapsular hematoma compatible with grade 3 splenic injury s/p embolization with IR (12/22), abd pain - better now. [place] had to go to [place] again to be checked for anemia... C/o worsening stress. Just sold her house in Dec 2022. Pt is requesting her HRT filled Per recent hx:  "Primary Discharge Diagnoses:   1. Splenic subcapsular hematoma compatible with grade 3 splenic injury s/p embolization with IR (12/22) 2. Recent EGD/Colonoscopy s/p hepatic flexure polyp removal  Secondary Discharge Diagnoses:   1. History of HLD, GERD, MDD, RA  *For documentation of patient's Past Medical History and Problem List, see the last page. TO DO List at Follow-up for PCP/Specialist:   - Key Medication changes: None - Other:  - Follow-up with PCP - Trend hemoglobin - Patient given discharge instructions with warning signs  Indication for Hospitalization:   Jenel Gierke is an 71 y.o. female who presented to [place] on 04/18/2021 for abdominal pain and was found to have splenic laceration.   Hospital Course:   For the details of admission, please see the H&P by Dr. Ronny Flurry on 04/18/2021. For full details, please see progress notes, consult notes and ancillary notes. The patient's hospital course will be summarized in a problem based approach below.   # Splenic subcapsular hematoma compatible with grade 3 splenic injury s/p embolization with IR (04/18/21) # Recent EGD/Colonoscopy s/p hepatic flexure polyp removal (04/12/2021) Patient presented to OSH for LUQ abdominal pain and was found to have a splenic subcapsular hematoma in setting of recent hepatic flexure polyp removal. GI agreed transfer from OSH. On arrival, patient was HDS with Hgb slightly lower than baseline at 11.1 at OSH  (baseline 12-13). She endorsed pain with deep breath, movements, and deep palpation of LUQ abdomen. IR was consulted and performed embolization on 12/22. EGS was consulted per routine, and recommended at least another night of observation after embolization given risk of failure. Patient's hemoglobin remained stable >10, and patient noted improvement in her pain. She did have some discomfort with bowel movements and was advised to try a stool softener.  The patient's chronic medical conditions were treated accordingly per the patient's home medication regimen except as noted in the plan above and in the medication list below. "   Outpatient Medications Prior to Visit  Medication Sig Dispense Refill   amphetamine-dextroamphetamine (ADDERALL) 10 MG tablet Take 1 tablet (10 mg total) by mouth 2 (two) times daily with a meal. 60 tablet 0   betamethasone dipropionate 0.05 % cream Apply topically 2 (two) times daily. 45 g 1   imipramine (TOFRANIL) 50 MG tablet Take 2 tablets (100 mg total) by mouth at bedtime. 60 tablet 1   LORazepam (ATIVAN) 1 MG tablet Take 1 tablet (1 mg total) by mouth every 8 (eight) hours. 90 tablet 1   meloxicam (MOBIC) 7.5 MG tablet Take 1-2 tablets (7.5-15 mg total) by mouth daily as needed for pain. 180 tablet 1   methocarbamol (ROBAXIN) 500 MG tablet Take 1 tablet (500 mg total) by mouth every 6 (six) hours as needed for muscle spasms. 40 tablet 0   omega-3 acid ethyl esters (LOVAZA) 1 g capsule Take 2 capsules (2 g total) by mouth 2 (two) times daily. 120 capsule 11   ondansetron (ZOFRAN) 4 MG tablet Take 1 tablet (4  mg total) by mouth every 8 (eight) hours as needed for nausea or vomiting. 40 tablet 1   pantoprazole (PROTONIX) 40 MG tablet Take 1 tablet (40 mg total) by mouth daily. 90 tablet 3   pravastatin (PRAVACHOL) 20 MG tablet Take one tablet by mouth 4 days per week 48 tablet 3   traMADol (ULTRAM) 50 MG tablet TAKE 1/2 TO 1 TABLET(25 TO 50 MG) BY MOUTH BACK EVERY 8  HOURS AS NEEDED FOR SEVERE PAIN 90 tablet 0   vitamin E 400 UNIT capsule Take 800 Units by mouth daily.      estradiol (ESTRACE) 1 MG tablet Take 1.5 tablets (1.5 mg total) by mouth daily. 45 tablet 1   NP THYROID 60 MG tablet Take 1 tablet (60 mg total) by mouth daily before breakfast. 30 tablet 1   pregabalin (LYRICA) 75 MG capsule Take 75 mg by mouth 2 (two) times daily.     progesterone (PROMETRIUM) 200 MG capsule Take 2 capsules (400 mg total) by mouth at bedtime. 60 capsule 1   propranolol (INDERAL) 10 MG tablet TAKE 1 TO 3 TABLETS BY MOUTH TWICE DAILY AS NEEDED FOR ANXIETY 540 tablet 0   Testosterone 1.62 % GEL Place 5 mg onto the skin daily. 30 g 3   nitroGLYCERIN (NITROSTAT) 0.4 MG SL tablet Place 1 tablet (0.4 mg total) under the tongue every 5 (five) minutes as needed for chest pain. 25 tablet 2   Calcium Carb-Cholecalciferol (CALCIUM 600+D3 PO) Take 1,200 tablets by mouth daily.  (Patient not taking: Reported on 03/28/2021)     Cholecalciferol (VITAMIN D3) 125 MCG (5000 UT) CAPS See admin instructions. Take 1 by mouth as directed (Patient not taking: Reported on 03/28/2021)     No facility-administered medications prior to visit.    ROS: Review of Systems  Constitutional:  Positive for fatigue and unexpected weight change. Negative for activity change, appetite change and chills.  HENT:  Negative for congestion, mouth sores and sinus pressure.   Eyes:  Negative for visual disturbance.  Respiratory:  Negative for cough and chest tightness.   Gastrointestinal:  Positive for abdominal pain and nausea.  Genitourinary:  Negative for difficulty urinating, frequency and vaginal pain.  Musculoskeletal:  Negative for back pain and gait problem.  Skin:  Negative for pallor and rash.  Neurological:  Negative for dizziness, tremors, weakness, numbness and headaches.  Psychiatric/Behavioral:  Negative for confusion and sleep disturbance.    Objective:  BP 124/82 (BP Location: Left Arm)     Pulse 92    Temp 98 F (36.7 C) (Oral)    Ht 4\' 11"  (1.499 m)    Wt 113 lb (51.3 kg)    LMP 01/08/1999    SpO2 97%    BMI 22.82 kg/m   BP Readings from Last 3 Encounters:  04/30/21 124/82  04/18/21 136/61  04/15/21 118/76    Wt Readings from Last 3 Encounters:  04/30/21 113 lb (51.3 kg)  04/17/21 116 lb (52.6 kg)  04/15/21 115 lb (52.2 kg)    Physical Exam Constitutional:      General: She is not in acute distress.    Appearance: She is well-developed. She is not ill-appearing or toxic-appearing.  HENT:     Head: Normocephalic.     Right Ear: External ear normal.     Left Ear: External ear normal.     Nose: Nose normal.  Eyes:     General:        Right eye: No discharge.  Left eye: No discharge.     Conjunctiva/sclera: Conjunctivae normal.     Pupils: Pupils are equal, round, and reactive to light.  Neck:     Thyroid: No thyromegaly.     Vascular: No JVD.     Trachea: No tracheal deviation.  Cardiovascular:     Rate and Rhythm: Normal rate and regular rhythm.     Heart sounds: Normal heart sounds.  Pulmonary:     Effort: No respiratory distress.     Breath sounds: No stridor. No wheezing.  Abdominal:     General: Bowel sounds are normal. There is no distension.     Palpations: Abdomen is soft. There is no mass.     Tenderness: There is abdominal tenderness. There is no guarding or rebound.  Musculoskeletal:        General: No tenderness.     Cervical back: Normal range of motion and neck supple. No rigidity.  Lymphadenopathy:     Cervical: No cervical adenopathy.  Skin:    Findings: No erythema or rash.  Neurological:     Mental Status: She is oriented to person, place, and time.     Cranial Nerves: No cranial nerve deficit.     Motor: No abnormal muscle tone.     Coordination: Coordination normal.     Deep Tendon Reflexes: Reflexes normal.  Psychiatric:        Behavior: Behavior normal.        Thought Content: Thought content normal.         Judgment: Judgment normal.  Thin  Lab Results  Component Value Date   WBC 5.0 04/17/2021   HGB 11.1 (L) 04/17/2021   HCT 33.4 (L) 04/17/2021   PLT 318 04/17/2021   GLUCOSE 92 04/17/2021   CHOL 203 (H) 12/19/2020   TRIG 93 12/19/2020   HDL 98 12/19/2020   LDLDIRECT 120.7 08/04/2012   LDLCALC 89 12/19/2020   ALT 12 02/25/2021   AST 16 02/25/2021   NA 130 (L) 04/17/2021   K 3.8 04/17/2021   CL 97 (L) 04/17/2021   CREATININE 1.00 04/17/2021   BUN 15 04/17/2021   CO2 26 04/17/2021   TSH 0.35 (L) 08/27/2020   INR 1.0 07/06/2020   HGBA1C 5.4 02/11/2018    DG Chest 2 View  Result Date: 04/17/2021 CLINICAL DATA:  Chest and abdominal pain for 2 days, had endoscopy and colonoscopy 5 days ago EXAM: CHEST - 2 VIEW COMPARISON:  04/15/2021 FINDINGS: Normal heart size, mediastinal contours, and pulmonary vascularity. Calcified tortuous thoracic aorta. Linear subsegmental atelectasis RIGHT base. Lungs otherwise clear. No pulmonary infiltrate, pleural effusion, or pneumothorax. RIGHT glenohumeral degenerative changes. Scattered degenerative disc disease changes thoracic spine. IMPRESSION: Subsegmental atelectasis at RIGHT base. Aortic Atherosclerosis (ICD10-I70.0). Electronically Signed   By: Lavonia Dana M.D.   On: 04/17/2021 16:46   CT Angio Chest PE W/Cm &/Or Wo Cm  Result Date: 04/17/2021 CLINICAL DATA:  Left-sided chest and neck pain, status post recent endoscopy and colonoscopy. EXAM: CT ANGIOGRAPHY CHEST WITH CONTRAST TECHNIQUE: Multidetector CT imaging of the chest was performed using the standard protocol during bolus administration of intravenous contrast. Multiplanar CT image reconstructions and MIPs were obtained to evaluate the vascular anatomy. CONTRAST:  12mL OMNIPAQUE IOHEXOL 350 MG/ML SOLN COMPARISON:  June 13, 2015 FINDINGS: Cardiovascular: Mild calcification of the aortic arch is seen, without evidence of aortic aneurysm or dissection. Satisfactory opacification of the  pulmonary arteries to the segmental level. No evidence of pulmonary embolism. Normal heart size. No  pericardial effusion. Mediastinum/Nodes: No enlarged mediastinal, hilar, or axillary lymph nodes. Thyroid gland, trachea, and esophagus demonstrate no significant findings. Lungs/Pleura: Mild atelectasis is seen within the posterior aspects of the bilateral lower lobes. There is no evidence of an acute infiltrate, pleural effusion or pneumothorax. Upper Abdomen: A mild-to-moderate amount of perisplenic fluid is seen. Musculoskeletal: No chest wall abnormality. Chronic and degenerative changes seen involving the right humeral head. Review of the MIP images confirms the above findings. IMPRESSION: 1. No CT evidence of pulmonary embolism. 2. Mild bilateral lower lobe atelectasis. 3. Mild-to-moderate amount of perisplenic fluid. Correlation with abdomen pelvis CT is recommended to exclude the presence of an underlying inflammatory process. 4. Aortic atherosclerosis. Aortic Atherosclerosis (ICD10-I70.0). Electronically Signed   By: Virgina Norfolk M.D.   On: 04/17/2021 19:45   CT Abdomen Pelvis W Contrast  Result Date: 04/17/2021 CLINICAL DATA:  Chest and abdominal pain. Endoscopy and colonoscopy on Friday. EXAM: CT ABDOMEN AND PELVIS WITH CONTRAST TECHNIQUE: Multidetector CT imaging of the abdomen and pelvis was performed using the standard protocol following bolus administration of intravenous contrast. CONTRAST:  21mL OMNIPAQUE IOHEXOL 350 MG/ML SOLN COMPARISON:  None. FINDINGS: Lower chest: No acute abnormality. Hepatobiliary: No focal liver abnormality is seen. No gallstones, gallbladder wall thickening, or biliary dilatation. Pancreas: Unremarkable. No pancreatic ductal dilatation or surrounding inflammatory changes. Spleen: There is a lateral splenic subcapsular hematoma measuring up to 2.4 cm in thickness. This encompasses approximately 65% of the spleen. Underlying spleen is within normal limits  demonstrating normal enhancement. No splenic lacerations are identified. Adrenals/Urinary Tract: The bladder is not well evaluated secondary to streak artifact in the pelvis. There is no hydronephrosis or perinephric fluid. There are hypodensities in both kidneys which are too small to characterize, likely cysts. The adrenal glands are within normal limits. Stomach/Bowel: Evaluation of bowel in the pelvis is significantly limited secondary to streak artifact in the pelvis. There is no evidence for bowel obstruction, focal wall thickening or inflammation. There is a large amount of stool throughout the colon. The appendix is not seen. The stomach is decompressed. Vascular/Lymphatic: Aortic atherosclerosis. No enlarged abdominal or pelvic lymph nodes. Reproductive: Not well evaluated secondary to streak artifact in the pelvis. Other: There is a small amount of hyperdense fluid in the pelvis, likely hemorrhage. There is no focal abdominal wall hernia. Musculoskeletal: There are severe degenerative changes throughout the spine. Bilateral hip arthroplasties are present. IMPRESSION: 1. Splenic subcapsular hematoma compatible with grade 3 splenic injury. No splenic laceration. 2. Small amount of hemorrhage in the pelvis. 3. Large amount of stool throughout the colon. No bowel obstruction or free air. 4.  Aortic Atherosclerosis (ICD10-I70.0). Electronically Signed   By: Ronney Asters M.D.   On: 04/17/2021 19:56   DG Abd 2 Views  Result Date: 04/17/2021 CLINICAL DATA:  Chest and abdominal pain EXAM: ABDOMEN - 2 VIEW COMPARISON:  None. FINDINGS: The bowel gas pattern is normal. Lung bases are clear. There is no evidence of free air. No radio-opaque calculi or other significant radiographic abnormality is seen. Bilateral hip arthroplasty with intact hardware. 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 Nonobstructive bowel gas pattern. Lung bases are clear. Electronically Signed   By: Keane Police D.O.   On: 04/17/2021 16:48    Assessment & Plan:   Problem List Items Addressed This Visit     Anxiety disorder    Worse due to  stress. Just sold her house in Dec 2022      Generalized abdominal pain   Hematoma of spleen, closed, subsequent encounter    New splenic subcapsular hematoma compatible with grade 3 splenic injury s/p embolization with IR (12/22) - ?post-colonoscopy CBC today at [facility]      Menopausal disorder    We discussed bioidentical hormone replacement concept - I do not do it.  I will renew her hormones for a month or two again.  We will have to find another physician who does identical hormone replacement treatments - Alexander T. Augoustides, MD - [facility] integrative medicine.      Menopause    Dr Anastasio Champion was prescribing hormones: testosterone, progesterone, estrogen, thyroid. Ceilia will need to see Dr Hal Neer for HRT We discussed bioidentical hormone replacement concept - I do not do it.  I will renew her hormones for a month or two again.  We will have to find another physician who does identical hormone replacement treatments - Alexander T. Augoustides, MD - [facility] integrative medicine. I renewed her Rx temporarily        Nausea & vomiting    Tracey may need a HIDA scan      Stress at home    Just sold her house in Dec 2022. Discussed         Meds ordered this encounter  Medications   NP THYROID 60 MG tablet    Sig: Take 1 tablet (60 mg total) by mouth daily before breakfast.    Dispense:  30 tablet    Refill:  1   progesterone (PROMETRIUM) 200 MG capsule    Sig: Take 2 capsules (400 mg total) by mouth at bedtime.    Dispense:  60 capsule    Refill:  1    Correct dose   Testosterone 1.62 % GEL    Sig: Place 5 mg onto the skin daily.    Dispense:  30 g    Refill:  3   estradiol (ESTRACE) 1 MG tablet    Sig: Take 1.5 tablets (1.5 mg  total) by mouth daily.    Dispense:  45 tablet    Refill:  1      Follow-up: Return in about 3 months (around 07/29/2021) for a follow-up visit.  Walker Kehr, MD

## 2021-04-30 NOTE — Assessment & Plan Note (Signed)
Just sold her house in Dec 2022. Discussed

## 2021-05-01 ENCOUNTER — Ambulatory Visit (HOSPITAL_COMMUNITY)
Admission: RE | Admit: 2021-05-01 | Discharge: 2021-05-01 | Disposition: A | Discharge status: Home / Self Care | Payer: Medicare Other | Source: Ambulatory Visit | Attending: Internal Medicine | Admitting: Internal Medicine

## 2021-05-01 ENCOUNTER — Ambulatory Visit: Payer: Medicare Other | Admitting: Internal Medicine

## 2021-05-01 ENCOUNTER — Other Ambulatory Visit: Payer: Medicare Other | Admitting: *Deleted

## 2021-05-01 DIAGNOSIS — I2510 Atherosclerotic heart disease of native coronary artery without angina pectoris: Secondary | ICD-10-CM

## 2021-05-01 DIAGNOSIS — I739 Peripheral vascular disease, unspecified: Secondary | ICD-10-CM

## 2021-05-01 DIAGNOSIS — R0989 Other specified symptoms and signs involving the circulatory and respiratory systems: Secondary | ICD-10-CM | POA: Insufficient documentation

## 2021-05-01 LAB — LIPID PANEL
Chol/HDL Ratio: 2.2 ratio (ref 0.0–4.4)
Cholesterol, Total: 174 mg/dL (ref 100–199)
HDL: 78 mg/dL (ref >39)
LDL Chol Calc (NIH): 82 mg/dL (ref 0–99)
Triglycerides: 72 mg/dL (ref 0–149)
VLDL Cholesterol Cal: 14 mg/dL (ref 5–40)

## 2021-05-02 ENCOUNTER — Other Ambulatory Visit: Payer: Self-pay

## 2021-05-02 ENCOUNTER — Ambulatory Visit (INDEPENDENT_AMBULATORY_CARE_PROVIDER_SITE_OTHER): Payer: Medicare Other | Admitting: Psychiatry

## 2021-05-02 ENCOUNTER — Encounter: Payer: Self-pay | Admitting: Psychiatry

## 2021-05-02 DIAGNOSIS — D508 Other iron deficiency anemias: Secondary | ICD-10-CM

## 2021-05-02 DIAGNOSIS — F331 Major depressive disorder, recurrent, moderate: Secondary | ICD-10-CM

## 2021-05-02 DIAGNOSIS — I2510 Atherosclerotic heart disease of native coronary artery without angina pectoris: Secondary | ICD-10-CM

## 2021-05-02 DIAGNOSIS — F411 Generalized anxiety disorder: Secondary | ICD-10-CM | POA: Diagnosis not present

## 2021-05-02 DIAGNOSIS — F902 Attention-deficit hyperactivity disorder, combined type: Secondary | ICD-10-CM

## 2021-05-02 MED ORDER — IMIPRAMINE HCL 50 MG PO TABS: 150.0000 mg | ORAL_TABLET | Freq: Every day | ORAL | 1 refills | Status: DC

## 2021-05-02 NOTE — Progress Notes (Signed)
Courtney [name] [id number] 1951-02-04 71 y.o.  Subjective:   Patient ID:  Courtney [name] is a 71 y.o. (DOB 24-Jan-1951) female.  Chief Complaint:  Chief Complaint  Patient presents with   Follow-up   Depression   Anxiety   Stress    HPI Courtney [name] presents to the office today for follow-up of first visit 03/01/2020 referred by a friend [name].  Was prescribed Viibryd and Vyvanse was changed to Concerta 54 mg to try to reduce the amount of dry mouth.  05/02/2020 appointment with the following noted: Less dry mouth with Concerta but didn't seem to last beyond 3-4 PM. Viibryd 20 mg daily.  Hard to get enough calories at breakfast.  But has been taking both in the morning.  Some benefit with Viibryd. Is there something I can take besides a stimulant, bc fears it is stressing her body.  Dx college exhausted adrenal glands.  It helps.  Asked questions about alternatives to stimulants.  Reduced Concerta to reduce anxiety and see if can achieve, but she thinks it's worse in the evening. Anxiety is still a struggle.  Life crisis moment.  Chronic marital dissatisfaction worse now that both are retired.  Feels like she's in a prison.  Family notices she's stressed.  Has done therapy since age 33 yo.  Has done Hindu meditation without help.  Christian faith disciplines.  Grew up caretaking.  I'm done and I need to change.  Goal is calm down enough to function through the situation. Tingling foot and wants B12 testing. Plan: Switch Viibryd to 20 mg in evening meal to get better absorption.  06/18/2020 appointment with following noted: No difference in anxiety or mood with Viibryd 20 mg daily.  She doesn't want to increase it. Questions about Viibryd and Genesight testing.  Asks about differences between Adderall and MPH.   Missed for 3 days Concerta and took Adderall.    Tired by 4-5 PM and doesn't like that.    Plan: She wants to wean off the Viibryd because she does not feel  it has been helpful and she does not want to increase the dosage.  07/03/2020 phone call patient stating she wanted to continue Viibryd 20 mg daily.  She also requested refill of Concerta 54 mg  3/00/7622 appointment with the following noted: Was told she couldn't take Viibryd with one of the pain meds and had TKR.  So stopped it.  Couldn't tolerate pain meds except tramadol.  Had more pain than expected.   Stopped Viibryd about 07/15/20.  She had taken tramadol with Viibryd in the past without a problem.   Noticing more pain after surgery in non-surgical places. Seems to be sensitive to getting lightheaded and confused with low blood sugar.   Poor attention and scattered since surgery.  Also more tired. Occ taken tramadol and otherwise just Tylenol. Impossible to tell the effect of the Viibryd given she hasn't been herself for months. Concerned about H's Geoff's memory who is also a patient here.  Wants him to get neuropsych testing. Plan because patient is med sensitive we will start very low-dose fluvoxamine 25 mg nightly Per her request continue Concerta 54 mg every morning  10/25/2020 appointment with the following noted: Several phone calls since being here.  The first led to increasing fluvoxamine to 1-1/2 of the 25 mg tablets due to lack of effect at 3 weeks. 09/21/2020 she called asking to stop Concerta which she did. She called again wanting to start Adderall.  Because  she had taken it in 2019 it was agreed that she could start Adderall 10 mg twice daily. Stress dealing with husband and whether to move or not.  May separate.  Anxiety is very high.  Hard to calm down around him. $ stress.  Explodes with anger at husband.  Doesn't think he can sell the house on his own. Never been this stressed and anxious and in this kind of circumstance before. Never got the Adderall DT need for PA. Only caffeine in the AM Plan: Rec increase Luvox from 37.5 mg daily to 50 mg HS for a week then increase  to 75 mg daily and possibly higher.  B12 level checked 496 and normal. Hold Adderall until the anxiety is under control.   12/03/2020 phone call from patient: Patient called stating fluvoxamine made her more anxious and high strung and she stopped it.  She wanted an earlier appointment which was not available at the time she was given the option to see a nurse practitioner but refused.  12/19/2020 appointment with the following noted: Life very stressful right now and not getting better.  B in law died and marriage falling apart. Separating.   More than I can handle including anxiety and depression. Plan: Rec trial beta blocker propranolol 10-30 mg  twice daily for anxiety Consider TCA bc Genesight test  B12 level checked 496 and normal. Hold Adderall until the anxiety is under control.   01/01/2021 phone call from patient with nurse as follows: She is taking up to 30 mg propranolol twice daily without sufficient benefit for her anxiety.  01/03/2021 appointment with the following noted: Rare Adderall.  Propranolol didn't help anxiety much at 30 mg BID without SE.[name]   Thinks she's gotten depressed which is unusual.  In a perfect storm with divorce and moving and financial stress.  Not functioning well.  Hard to make a decision.  Poor productivity.   Having a lot of pain, chronically and worse lately. Starting Lyrica today. Taking alprazolam about 0.5 mg daily. Plan: Yes imipramine 25 and increase to 75 mg HS and then check blood level  02/26/21 appt noted: 01/21/2021 serum imipramine and desipramine total was 65 at 75 mg daily. It has made a difference.  But has made so many changes and moving.  Stress goes to GI system.   Was in ER last night with GI px and may have ulcer.  Unable to eat without nausea.  Lump in the throat nausea. Mild taking the edge off.  Can still explode. Can't remember the SE at 100 mg daily. GI dominating with pain and nausea. Sleep varies from good or bothered by  sickness. Usually fine but sometimes can't turn her brain off.  Moves in 5 days. Plan: Continue imipramine and increase to 100 mg again as soon as nausea managed. Add risperidone 1 mg HS off label for nausea and anxiety.  03/28/2021 appt noted: She is not taking the risperidone.  Tried it for a couple of weeks but did not help the nausea.  Didn't notice it helping anxiety but had tremor.  Off for 2 weeks and tremor better. Still on imipramine 75 mg nightly.  She did increase but wasn't sure it was causing the tremor as instructed. This week to GI and work up ordered and doubled med dose. Not handling the transition well.  Very irritable with husband.  Such a sense of urgency.   Not often with Xanax.   Not on Adderall.   Plan: imipramine and  increase to 100 mg nightly.  B12 level checked 496 and normal. Hold Adderall until the anxiety is under control.  Switch Xanax to Ativan 1 mg tid since she is not on Adderall.    05/02/2021 appt noted: Increased imipramine 100 mg HS. Tolerated the increase Spleen bleed. Yesterday decent energy compared to what she had.  Ativan made her sleepy but only took it once. Sleeping a lot DT spleen injury. Can't tell about depression bc of injury kept her in bed.  Has to move to feel normal.    Past Psychiatric Medication Trials: Trintellix NR, sertraline?,  Viibryd 20, remote zoloft, Paxil, Cymbalta couple days ? Adverse reaction Fluvoxamine SE anxiety but ? Adequacy of trial bc stress Fluvoxamine 100 in 2015 per Dr. [name] Gabapentin 1600 NR Lyrica  Risperidone 1 tremor Vyvanse, Adderall, concerta DC propranolol 10-30 mg  twice daily for anxiety bc not helpful History Genesight History of Dr. [name] and Pauline Good  Review of Systems:  Review of Systems  Cardiovascular:  Negative for chest pain and palpitations.  Gastrointestinal:  Negative for nausea.  Musculoskeletal:  Positive for back pain.  Neurological:  Positive for weakness. Negative for  tremors.  Psychiatric/Behavioral:  Positive for dysphoric mood. Negative for agitation, confusion, decreased concentration and hallucinations. The patient is nervous/anxious.    Medications: I have reviewed the patient's current medications.  Current Outpatient Medications  Medication Sig Dispense Refill   betamethasone dipropionate 0.05 % cream Apply topically 2 (two) times daily. 45 g 1   estradiol (ESTRACE) 1 MG tablet Take 1.5 tablets (1.5 mg total) by mouth daily. 45 tablet 1   LORazepam (ATIVAN) 1 MG tablet Take 1 tablet (1 mg total) by mouth every 8 (eight) hours. 90 tablet 1   meloxicam (MOBIC) 7.5 MG tablet Take 1-2 tablets (7.5-15 mg total) by mouth daily as needed for pain. 180 tablet 1   methocarbamol (ROBAXIN) 500 MG tablet Take 1 tablet (500 mg total) by mouth every 6 (six) hours as needed for muscle spasms. 40 tablet 0   NP THYROID 60 MG tablet Take 1 tablet (60 mg total) by mouth daily before breakfast. 30 tablet 1   omega-3 acid ethyl esters (LOVAZA) 1 g capsule Take 2 capsules (2 g total) by mouth 2 (two) times daily. 120 capsule 11   ondansetron (ZOFRAN) 4 MG tablet Take 1 tablet (4 mg total) by mouth every 8 (eight) hours as needed for nausea or vomiting. 40 tablet 1   pantoprazole (PROTONIX) 40 MG tablet Take 1 tablet (40 mg total) by mouth daily. 90 tablet 3   pravastatin (PRAVACHOL) 20 MG tablet Take one tablet by mouth 4 days per week 48 tablet 3   progesterone (PROMETRIUM) 200 MG capsule TAKE 2 CAPSULES(400 MG) BY MOUTH AT BEDTIME 180 capsule 0   Testosterone 1.62 % GEL Place 5 mg onto the skin daily. 30 g 3   traMADol (ULTRAM) 50 MG tablet TAKE 1/2 TO 1 TABLET(25 TO 50 MG) BY MOUTH BACK EVERY 8 HOURS AS NEEDED FOR SEVERE PAIN 90 tablet 0   vitamin E 400 UNIT capsule Take 800 Units by mouth daily.      amphetamine-dextroamphetamine (ADDERALL) 10 MG tablet Take 1 tablet (10 mg total) by mouth 2 (two) times daily with a meal. (Patient not taking: Reported on 05/02/2021) 60  tablet 0   imipramine (TOFRANIL) 50 MG tablet Take 3 tablets (150 mg total) by mouth at bedtime. 90 tablet 1   nitroGLYCERIN (NITROSTAT) 0.4 MG SL tablet  Place 1 tablet (0.4 mg total) under the tongue every 5 (five) minutes as needed for chest pain. 25 tablet 2   No current facility-administered medications for this visit.    Medication Side Effects: Other: dry mouth  Allergies:  Allergies  Allergen Reactions   Clarithromycin Nausea Only   Covid-19 (Mrna) Vaccine Other (See Comments)    Acute Vasculitis; excessive bleeding  Other reaction(s): Other (See Comments) Acute Vasculitis; excessive bleeding    Other Other (See Comments)   Bactrim [Sulfamethoxazole-Trimethoprim] Nausea And Vomiting   Morphine Nausea And Vomiting    Other reaction(s): Nausea/Vomiting   Morphine And Related Nausea And Vomiting   Atorvastatin     Other reaction(s): Myalgias (intolerance)   Azithromycin Nausea And Vomiting and Nausea Only   Ceclor [Cefaclor] Nausea And Vomiting    Can take Augmentin ok   Codeine Nausea And Vomiting   Cymbalta [Duloxetine Hcl]    Erythromycin Nausea And Vomiting    Other reaction(s): Nausea/Vomiting   Erythromycin Base Nausea And Vomiting   Levofloxacin Nausea Only    REACTION: nausea   Loratadine     REACTION: bruises   Nitrofurantoin Nausea Only   Oxycodone Other (See Comments)   Penicillins Nausea And Vomiting   Propoxyphene Nausea Only    dizzy   Propoxyphene N-Acetaminophen Nausea Only    dizzy    Past Medical History:  Diagnosis Date   ADD (attention deficit disorder)    Anxiety    Celiac disease    possible vs IBS   Complication of anesthesia    Coronary artery disease    Depression    no bipolar per Dr. [name]   GI problem    Olevia Perches   Gluten intolerance    Hyperlipidemia    IBS (irritable bowel syndrome)    Osteoarthritis    Osteoporosis    PONV (postoperative nausea and vomiting)    Rheumatoid arthritis (HCC)    Seasonal allergies 01-08-12    hx. of multiple bronchitis related to this.   Shoulder pain, right     Family History  Problem Relation Age of Onset   Heart disease Mother    Heart attack Mother    Prostate cancer Father    Coronary artery disease Other        FH Female 1st degree relative <60   ADD / ADHD Other     Social History   Socioeconomic History   Marital status: Married    Spouse name: Not on file   Number of children: Not on file   Years of education: Not on file   Highest education level: Not on file  Occupational History   Occupation: Futures trader  Tobacco Use   Smoking status: Never   Smokeless tobacco: Never   Tobacco comments:    only Hospital doctor   Vaping Use: Never used  Substance and Sexual Activity   Alcohol use: Yes    Alcohol/week: 0.0 standard drinks    Comment: rare social    Drug use: No   Sexual activity: Yes  Other Topics Concern   Not on file  Social History Narrative   Married for last 42 years.Lives with husband.Retired Insurance account manager.Originally from [place].   Social Determinants of Health   Financial Resource Strain: Low Risk    Difficulty of Paying Living Expenses: Not hard at all  Food Insecurity: No Food Insecurity   Worried About Charity fundraiser in the Last Year: Never true   Ran Out  of Food in the Last Year: Never true  Transportation Needs: No Transportation Needs   Lack of Transportation (Medical): No   Lack of Transportation (Non-Medical): No  Physical Activity: Sufficiently Active   Days of Exercise per Week: 5 days   Minutes of Exercise per Session: 30 min  Stress: No Stress Concern Present   Feeling of Stress : Not at all  Social Connections: Socially Integrated   Frequency of Communication with Friends and Family: More than three times a week   Frequency of Social Gatherings with Friends and Family: More than three times a week   Attends Religious Services: 1 to 4 times per year   Active Member of [employer] or  Organizations: Yes   Attends Archivist Meetings: 1 to 4 times per year   Marital Status: Married  Human resources officer Violence: Not At Risk   Fear of Current or Ex-Partner: No   Emotionally Abused: No   Physically Abused: No   Sexually Abused: No    Past Medical History, Surgical history, Social history, and Family history were reviewed and updated as appropriate.   Please see review of systems for further details on the patient's review from today.   Objective:   Physical Exam:  LMP 01/08/1999   Physical Exam Constitutional:      General: She is not in acute distress. Musculoskeletal:        General: No deformity.  Neurological:     Mental Status: She is alert and oriented to person, place, and time.     Coordination: Coordination normal.  Psychiatric:        Attention and Perception: Attention and perception normal. She does not perceive auditory or visual hallucinations.        Mood and Affect: Mood is anxious and depressed. Affect is not labile, blunt, angry or inappropriate.        Speech: Speech normal. Speech is not slurred.        Behavior: Behavior is not hyperactive.        Thought Content: Thought content normal. Thought content is not paranoid or delusional. Thought content does not include homicidal or suicidal ideation. Thought content does not include suicidal plan.        Cognition and Memory: Cognition and memory normal.        Judgment: Judgment normal.     Comments: Insight intact Hyperactive style less intense and less depressed Stressed.    Lab Review:     Component Value Date/Time   NA 130 (L) 04/17/2021 1825   NA 134 06/14/2018 1444   K 3.8 04/17/2021 1825   CL 97 (L) 04/17/2021 1825   CO2 26 04/17/2021 1825   GLUCOSE 92 04/17/2021 1825   BUN 15 04/17/2021 1825   BUN 10 06/14/2018 1444   CREATININE 1.00 04/17/2021 1825   CREATININE 0.76 04/10/2020 1431   CALCIUM 9.0 04/17/2021 1825   PROT 6.8 02/25/2021 1614   PROT 6.5 12/19/2020  0754   ALBUMIN 4.1 02/25/2021 1614   ALBUMIN 4.5 12/19/2020 0754   AST 16 02/25/2021 1614   ALT 12 02/25/2021 1614   ALKPHOS 47 02/25/2021 1614   BILITOT 0.3 02/25/2021 1614   BILITOT 0.4 12/19/2020 0754   GFRNONAA 60 (L) 04/17/2021 1825   GFRNONAA 79 04/10/2020 1431   GFRAA 91 04/10/2020 1431       Component Value Date/Time   WBC 5.0 04/17/2021 1825   RBC 3.53 (L) 04/17/2021 1825   HGB 11.1 (L) 04/17/2021 1825  HCT 33.4 (L) 04/17/2021 1825   PLT 318 04/17/2021 1825   MCV 94.6 04/17/2021 1825   MCH 31.4 04/17/2021 1825   MCHC 33.2 04/17/2021 1825   RDW 12.5 04/17/2021 1825   LYMPHSABS 2.0 06/14/2018 0005   MONOABS 0.7 06/14/2018 0005   EOSABS 0.2 06/14/2018 0005   BASOSABS 0.0 06/14/2018 0005    No results found for: POCLITH, LITHIUM   No results found for: PHENYTOIN, PHENOBARB, VALPROATE, CBMZ   06/04/20 B12 normal at 496  01/21/2021 imipramine level 25, desipramine level 40 equals total 65 which is low on 75 mg daily. (Goal 150-250)  .res Assessment: Plan:    Brittiny was seen today for follow-up, depression, anxiety and stress.  Diagnoses and all orders for this visit:  Major depressive disorder, recurrent episode, moderate (HCC) -     imipramine (TOFRANIL) 50 MG tablet; Take 3 tablets (150 mg total) by mouth at bedtime.  Generalized anxiety disorder -     imipramine (TOFRANIL) 50 MG tablet; Take 3 tablets (150 mg total) by mouth at bedtime.  Attention deficit hyperactivity disorder (ADHD), combined type  Other iron deficiency anemia   Greater than 50% of 30 min face to face time with patient was spent on counseling and coordination of care. We discussed  Genesight testing and her desire to take something for anxiety that is in the "Use as Directed" column.    Consider TCA bc Genesight test;  disc SE in detail incl cardiac, etc imipramine and increase to 150 mg nightly, based on prior level.   B12 level checked 496 and normal.  Hold Adderall until the  anxiety is under control.   Reduce Ativan 1/2 mg tid since she is not on Adderall.   Needs help right away.  Consider alternatives for anger outbursts, impulsivity like lithium, VPA, atypical.  [facility] at [facility]   Supportive therapy around marital crisis and other stressors.  Also extensive discussion about her frustration with availability of psychiatrist appts.  Disc boundaries around this issues and how to get messages back and forth.    Started therapy with Godfrey Pick.  FU 4 weeks  Lynder Parents, MD, DFAPA   Please see After Visit Summary for patient specific instructions.  Future Appointments  Date Time Provider [place]  05/29/2021  9:00 AM Cottle, Billey Co., MD [facility] None  06/26/2021  9:30 AM Cottle, Billey Co., MD [facility] None  07/22/2021 11:20 AM Plotnikov, Evie Lacks, MD [facility] None  08/30/2021  8:00 AM Belva Crome, MD [facility]    No orders of the defined types were placed in this encounter.     -------------------------------

## 2021-05-07 ENCOUNTER — Inpatient Hospital Stay: Payer: Medicare Other | Admitting: Internal Medicine

## 2021-05-21 DIAGNOSIS — S35299S Unspecified injury of branches of celiac and mesenteric artery, sequela: Secondary | ICD-10-CM | POA: Diagnosis not present

## 2021-05-21 DIAGNOSIS — Z6822 Body mass index (BMI) 22.0-22.9, adult: Secondary | ICD-10-CM | POA: Diagnosis not present

## 2021-05-21 DIAGNOSIS — R110 Nausea: Secondary | ICD-10-CM | POA: Diagnosis not present

## 2021-05-21 DIAGNOSIS — R5383 Other fatigue: Secondary | ICD-10-CM | POA: Diagnosis not present

## 2021-05-21 DIAGNOSIS — E039 Hypothyroidism, unspecified: Secondary | ICD-10-CM | POA: Diagnosis not present

## 2021-05-21 DIAGNOSIS — R29890 Loss of height: Secondary | ICD-10-CM | POA: Diagnosis not present

## 2021-05-21 DIAGNOSIS — K5900 Constipation, unspecified: Secondary | ICD-10-CM | POA: Diagnosis not present

## 2021-05-21 DIAGNOSIS — Z1231 Encounter for screening mammogram for malignant neoplasm of breast: Secondary | ICD-10-CM | POA: Diagnosis not present

## 2021-05-21 DIAGNOSIS — R799 Abnormal finding of blood chemistry, unspecified: Secondary | ICD-10-CM | POA: Diagnosis not present

## 2021-05-21 DIAGNOSIS — M8588 Other specified disorders of bone density and structure, other site: Secondary | ICD-10-CM | POA: Diagnosis not present

## 2021-05-21 DIAGNOSIS — Z01419 Encounter for gynecological examination (general) (routine) without abnormal findings: Secondary | ICD-10-CM | POA: Diagnosis not present

## 2021-05-21 DIAGNOSIS — N958 Other specified menopausal and perimenopausal disorders: Secondary | ICD-10-CM | POA: Diagnosis not present

## 2021-05-21 DIAGNOSIS — K5909 Other constipation: Secondary | ICD-10-CM | POA: Diagnosis not present

## 2021-05-28 DIAGNOSIS — R110 Nausea: Secondary | ICD-10-CM | POA: Diagnosis not present

## 2021-05-28 DIAGNOSIS — K3189 Other diseases of stomach and duodenum: Secondary | ICD-10-CM | POA: Diagnosis not present

## 2021-05-28 DIAGNOSIS — R7309 Other abnormal glucose: Secondary | ICD-10-CM | POA: Diagnosis not present

## 2021-05-29 ENCOUNTER — Encounter: Payer: Self-pay | Admitting: Psychiatry

## 2021-05-29 ENCOUNTER — Ambulatory Visit (INDEPENDENT_AMBULATORY_CARE_PROVIDER_SITE_OTHER): Payer: Medicare Other | Admitting: Psychiatry

## 2021-05-29 ENCOUNTER — Other Ambulatory Visit: Payer: Self-pay

## 2021-05-29 DIAGNOSIS — F411 Generalized anxiety disorder: Secondary | ICD-10-CM

## 2021-05-29 DIAGNOSIS — I2510 Atherosclerotic heart disease of native coronary artery without angina pectoris: Secondary | ICD-10-CM | POA: Diagnosis not present

## 2021-05-29 DIAGNOSIS — F902 Attention-deficit hyperactivity disorder, combined type: Secondary | ICD-10-CM | POA: Diagnosis not present

## 2021-05-29 DIAGNOSIS — F331 Major depressive disorder, recurrent, moderate: Secondary | ICD-10-CM

## 2021-05-29 MED ORDER — IMIPRAMINE HCL 50 MG PO TABS: 200.0000 mg | ORAL_TABLET | Freq: Every day | ORAL | 1 refills | Status: DC

## 2021-05-29 MED ORDER — AMPHETAMINE-DEXTROAMPHETAMINE 10 MG PO TABS: 10.0000 mg | ORAL_TABLET | Freq: Two times a day (BID) | ORAL | 0 refills | Status: DC

## 2021-05-29 NOTE — Progress Notes (Signed)
Courtney [name] [id number] [birth year] 71 y.o.  Subjective:   Patient ID:  Courtney [name] is a 71 y.o. (DOB 29-Sep-1950) female.  Chief Complaint:  Chief Complaint  Patient presents with   Follow-up   Anxiety   Depression    HPI Courtney [name] presents to the office today for follow-up of first visit 03/01/2020 referred by a friend [name].  Was prescribed Viibryd and Vyvanse was changed to Concerta 54 mg to try to reduce the amount of dry mouth.  05/02/2020 appointment with the following noted: Less dry mouth with Concerta but didn't seem to last beyond 3-4 PM. Viibryd 20 mg daily.  Hard to get enough calories at breakfast.  But has been taking both in the morning.  Some benefit with Viibryd. Is there something I can take besides a stimulant, bc fears it is stressing her body.  Dx college exhausted adrenal glands.  It helps.  Asked questions about alternatives to stimulants.  Reduced Concerta to reduce anxiety and see if can achieve, but she thinks it's worse in the evening. Anxiety is still a struggle.  Life crisis moment.  Chronic marital dissatisfaction worse now that both are retired.  Feels like she's in a prison.  Family notices she's stressed.  Has done therapy since age 44 yo.  Has done Hindu meditation without help.  Christian faith disciplines.  Grew up caretaking.  I'm done and I need to change.  Goal is calm down enough to function through the situation. Tingling foot and wants B12 testing. Plan: Switch Viibryd to 20 mg in evening meal to get better absorption.  06/18/2020 appointment with following noted: No difference in anxiety or mood with Viibryd 20 mg daily.  She doesn't want to increase it. Questions about Viibryd and Genesight testing.  Asks about differences between Adderall and MPH.   Missed for 3 days Concerta and took Adderall.    Tired by 4-5 PM and doesn't like that.    Plan: She wants to wean off the Viibryd because she does not feel it has been  helpful and she does not want to increase the dosage.  07/03/2020 phone call patient stating she wanted to continue Viibryd 20 mg daily.  She also requested refill of Concerta 54 mg  2/87/6811 appointment with the following noted: Was told she couldn't take Viibryd with one of the pain meds and had TKR.  So stopped it.  Couldn't tolerate pain meds except tramadol.  Had more pain than expected.   Stopped Viibryd about 07/15/20.  She had taken tramadol with Viibryd in the past without a problem.   Noticing more pain after surgery in non-surgical places. Seems to be sensitive to getting lightheaded and confused with low blood sugar.   Poor attention and scattered since surgery.  Also more tired. Occ taken tramadol and otherwise just Tylenol. Impossible to tell the effect of the Viibryd given she hasn't been herself for months. Concerned about Courtney [name]'s memory who is also a patient here.  Wants him to get neuropsych testing. Plan because patient is med sensitive we will start very low-dose fluvoxamine 25 mg nightly Per her request continue Concerta 54 mg every morning  10/25/2020 appointment with the following noted: Several phone calls since being here.  The first led to increasing fluvoxamine to 1-1/2 of the 25 mg tablets due to lack of effect at 3 weeks. 09/21/2020 she called asking to stop Concerta which she did. She called again wanting to start Adderall.  Because she had taken  it in 2019 it was agreed that she could start Adderall 10 mg twice daily. Stress dealing with husband and whether to move or not.  May separate.  Anxiety is very high.  Hard to calm down around him. $ stress.  Explodes with anger at husband.  Doesn't think he can sell the house on his own. Never been this stressed and anxious and in this kind of circumstance before. Never got the Adderall DT need for PA. Only caffeine in the AM Plan: Rec increase Luvox from 37.5 mg daily to 50 mg HS for a week then increase to 75 mg  daily and possibly higher.  B12 level checked 496 and normal. Hold Adderall until the anxiety is under control.   12/03/2020 phone call from patient: Patient called stating fluvoxamine made her more anxious and high strung and she stopped it.  She wanted an earlier appointment which was not available at the time she was given the option to see a nurse practitioner but refused.  12/19/2020 appointment with the following noted: Life very stressful right now and not getting better.  B in law died and marriage falling apart. Separating.   More than I can handle including anxiety and depression. Plan: Rec trial beta blocker propranolol 10-30 mg  twice daily for anxiety Consider TCA bc Genesight test  B12 level checked 496 and normal. Hold Adderall until the anxiety is under control.   01/01/2021 phone call from patient with nurse as follows: She is taking up to 30 mg propranolol twice daily without sufficient benefit for her anxiety.  01/03/2021 appointment with the following noted: Rare Adderall.  Propranolol didn't help anxiety much at 30 mg BID without SE.[name]   Thinks she's gotten depressed which is unusual.  In a perfect storm with divorce and moving and financial stress.  Not functioning well.  Hard to make a decision.  Poor productivity.   Having a lot of pain, chronically and worse lately. Starting Lyrica today. Taking alprazolam about 0.5 mg daily. Plan: Yes imipramine 25 and increase to 75 mg HS and then check blood level  02/26/21 appt noted: 01/21/2021 serum imipramine and desipramine total was 65 at 75 mg daily. It has made a difference.  But has made so many changes and moving.  Stress goes to GI system.   Was in ER last night with GI px and may have ulcer.  Unable to eat without nausea.  Lump in the throat nausea. Mild taking the edge off.  Can still explode. Can't remember the SE at 100 mg daily. GI dominating with pain and nausea. Sleep varies from good or bothered by sickness. Usually  fine but sometimes can't turn her brain off.  Moves in 5 days. Plan: Continue imipramine and increase to 100 mg again as soon as nausea managed. Add risperidone 1 mg HS off label for nausea and anxiety.  03/28/2021 appt noted: She is not taking the risperidone.  Tried it for a couple of weeks but did not help the nausea.  Didn't notice it helping anxiety but had tremor.  Off for 2 weeks and tremor better. Still on imipramine 75 mg nightly.  She did increase but wasn't sure it was causing the tremor as instructed. This week to GI and work up ordered and doubled med dose. Not handling the transition well.  Very irritable with husband.  Such a sense of urgency.   Not often with Xanax.   Not on Adderall.   Plan: imipramine and increase to 100  mg nightly.  B12 level checked 496 and normal. Hold Adderall until the anxiety is under control.  Switch Xanax to Ativan 1 mg tid since she is not on Adderall.    05/02/2021 appt noted: Increased imipramine 100 mg HS. Tolerated the increase Spleen bleed. Yesterday decent energy compared to what she had.  Ativan made her sleepy but only took it once. Sleeping a lot DT spleen injury. Can't tell about depression bc of injury kept her in bed.  Has to move to feel normal. Plan: imipramine and increase to 150 mg nightly, based on prior level.  05/29/21 appt noted: SE dry mouth Increased imipramine 150 mg HS Seen improvement in mood.  Meeting irritating situations with less response.  Better self control.  Less fear and anxiety. Ativan 1 mg prn makes her too sleepy and tired. Occ taken 1/2 Adderall with some energy and motivation.   GI work up ongoing with gastric emptying yesterday.  It's delayed ongoing.  GI doctor has commented on her hostility.   Sleeping really well.   Worst dep and anxiety was when moving and now 50% better.  Need to have some goals and since moving into townhouse and unknown what is next.  May move out of town to be near the kids at part  of the year but it's too cold.  Big decisions are hard. Difficulty with back pain so hasn't unpacked.   More hopeful about her health. Seasonal depression   Past Psychiatric Medication Trials: Trintellix NR, sertraline?,  Viibryd 20, remote zoloft, Paxil, Cymbalta couple days ? Adverse reaction Imipramine 150 better Fluvoxamine SE anxiety but ? Adequacy of trial bc stress Fluvoxamine 100 in 2015 per Dr. [name] Gabapentin 1600 NR Lyrica  Risperidone 1 tremor Vyvanse, Adderall, concerta DC propranolol 10-30 mg  twice daily for anxiety bc not helpful History Genesight History of Dr. [name] and Pauline Good  Review of Systems:  Review of Systems  Cardiovascular:  Negative for chest pain and palpitations.  Gastrointestinal:  Negative for nausea.  Musculoskeletal:  Positive for back pain.  Neurological:  Negative for tremors and weakness.  Psychiatric/Behavioral:  Positive for dysphoric mood. Negative for agitation, confusion, decreased concentration and hallucinations. The patient is nervous/anxious.    Medications: I have reviewed the patient's current medications.  Current Outpatient Medications  Medication Sig Dispense Refill   estradiol (ESTRACE) 1 MG tablet Take 1.5 tablets (1.5 mg total) by mouth daily. 45 tablet 1   meloxicam (MOBIC) 7.5 MG tablet Take 1-2 tablets (7.5-15 mg total) by mouth daily as needed for pain. 180 tablet 1   methocarbamol (ROBAXIN) 500 MG tablet Take 1 tablet (500 mg total) by mouth every 6 (six) hours as needed for muscle spasms. 40 tablet 0   omega-3 acid ethyl esters (LOVAZA) 1 g capsule Take 2 capsules (2 g total) by mouth 2 (two) times daily. 120 capsule 11   ondansetron (ZOFRAN) 4 MG tablet Take 1 tablet (4 mg total) by mouth every 8 (eight) hours as needed for nausea or vomiting. 40 tablet 1   pantoprazole (PROTONIX) 40 MG tablet Take 1 tablet (40 mg total) by mouth daily. 90 tablet 3   pravastatin (PRAVACHOL) 20 MG tablet Take one tablet by  mouth 4 days per week 48 tablet 3   traMADol (ULTRAM) 50 MG tablet TAKE 1/2 TO 1 TABLET(25 TO 50 MG) BY MOUTH BACK EVERY 8 HOURS AS NEEDED FOR SEVERE PAIN 90 tablet 0   vitamin E 400 UNIT capsule Take 800 Units  by mouth daily.      amphetamine-dextroamphetamine (ADDERALL) 10 MG tablet Take 1 tablet (10 mg total) by mouth 2 (two) times daily with a meal. 60 tablet 0   betamethasone dipropionate 0.05 % cream Apply topically 2 (two) times daily. (Patient not taking: Reported on 05/29/2021) 45 g 1   imipramine (TOFRANIL) 50 MG tablet Take 4 tablets (200 mg total) by mouth at bedtime. 120 tablet 1   LORazepam (ATIVAN) 1 MG tablet Take 1 tablet (1 mg total) by mouth every 8 (eight) hours. (Patient not taking: Reported on 05/29/2021) 90 tablet 1   nitroGLYCERIN (NITROSTAT) 0.4 MG SL tablet Place 1 tablet (0.4 mg total) under the tongue every 5 (five) minutes as needed for chest pain. 25 tablet 2   NP THYROID 60 MG tablet Take 1 tablet (60 mg total) by mouth daily before breakfast. (Patient not taking: Reported on 05/29/2021) 30 tablet 1   progesterone (PROMETRIUM) 200 MG capsule TAKE 2 CAPSULES(400 MG) BY MOUTH AT BEDTIME (Patient not taking: Reported on 05/29/2021) 180 capsule 0   Testosterone 1.62 % GEL Place 5 mg onto the skin daily. (Patient not taking: Reported on 05/29/2021) 30 g 3   No current facility-administered medications for this visit.    Medication Side Effects: Other: dry mouth  Allergies:  Allergies  Allergen Reactions   Clarithromycin Nausea Only   Covid-19 (Mrna) Vaccine Other (See Comments)    Acute Vasculitis; excessive bleeding  Other reaction(s): Other (See Comments) Acute Vasculitis; excessive bleeding    Other Other (See Comments)   Bactrim [Sulfamethoxazole-Trimethoprim] Nausea And Vomiting   Morphine Nausea And Vomiting    Other reaction(s): Nausea/Vomiting   Morphine And Related Nausea And Vomiting   Atorvastatin     Other reaction(s): Myalgias (intolerance)   Azithromycin  Nausea And Vomiting and Nausea Only   Ceclor [Cefaclor] Nausea And Vomiting    Can take Augmentin ok   Codeine Nausea And Vomiting   Cymbalta [Duloxetine Hcl]    Erythromycin Nausea And Vomiting    Other reaction(s): Nausea/Vomiting   Erythromycin Base Nausea And Vomiting   Levofloxacin Nausea Only    REACTION: nausea   Loratadine     REACTION: bruises   Nitrofurantoin Nausea Only   Oxycodone Other (See Comments)   Penicillins Nausea And Vomiting   Propoxyphene Nausea Only    dizzy   Propoxyphene N-Acetaminophen Nausea Only    dizzy    Past Medical History:  Diagnosis Date   ADD (attention deficit disorder)    Anxiety    Celiac disease    possible vs IBS   Complication of anesthesia    Coronary artery disease    Depression    no bipolar per Dr. [name]   GI problem    Olevia Perches   Gluten intolerance    Hyperlipidemia    IBS (irritable bowel syndrome)    Osteoarthritis    Osteoporosis    PONV (postoperative nausea and vomiting)    Rheumatoid arthritis (HCC)    Seasonal allergies 01-08-12   hx. of multiple bronchitis related to this.   Shoulder pain, right     Family History  Problem Relation Age of Onset   Heart disease Mother    Heart attack Mother    Prostate cancer Father    Coronary artery disease Other        FH Female 1st degree relative <60   ADD / ADHD Other     Social History   Socioeconomic History   Marital status:  Married    Spouse name: Not on file   Number of children: Not on file   Years of education: Not on file   Highest education level: Not on file  Occupational History   Occupation: Futures trader  Tobacco Use   Smoking status: Never   Smokeless tobacco: Never   Tobacco comments:    only social  Vaping Use   Vaping Use: Never used  Substance and Sexual Activity   Alcohol use: Yes    Alcohol/week: 0.0 standard drinks    Comment: rare social    Drug use: No   Sexual activity: Yes  Other Topics Concern   Not on file  Social  History Narrative   Married for last 42 years.Lives with husband.Retired Insurance account manager.Originally from [place].   Social Determinants of Health   Financial Resource Strain: Low Risk    Difficulty of Paying Living Expenses: Not hard at all  Food Insecurity: No Food Insecurity   Worried About Charity fundraiser in the Last Year: Never true   [place] in the Last Year: Never true  Transportation Needs: No Transportation Needs   Lack of Transportation (Medical): No   Lack of Transportation (Non-Medical): No  Physical Activity: Sufficiently Active   Days of Exercise per Week: 5 days   Minutes of Exercise per Session: 30 min  Stress: No Stress Concern Present   Feeling of Stress : Not at all  Social Connections: Socially Integrated   Frequency of Communication with Friends and Family: More than three times a week   Frequency of Social Gatherings with Friends and Family: More than three times a week   Attends Religious Services: 1 to 4 times per year   Active Member of [employer] or Organizations: Yes   Attends Archivist Meetings: 1 to 4 times per year   Marital Status: Married  Human resources officer Violence: Not At Risk   Fear of Current or Ex-Partner: No   Emotionally Abused: No   Physically Abused: No   Sexually Abused: No    Past Medical History, Surgical history, Social history, and Family history were reviewed and updated as appropriate.   Please see review of systems for further details on the patient's review from today.   Objective:   Physical Exam:  LMP 01/08/1999   Physical Exam Constitutional:      General: She is not in acute distress. Musculoskeletal:        General: No deformity.  Neurological:     Mental Status: She is alert and oriented to person, place, and time.     Coordination: Coordination normal.  Psychiatric:        Attention and Perception: Attention and perception normal. She does not perceive auditory or visual  hallucinations.        Mood and Affect: Mood is anxious and depressed. Affect is not labile, blunt, angry or inappropriate.        Speech: Speech normal. Speech is not slurred.        Behavior: Behavior is not hyperactive.        Thought Content: Thought content normal. Thought content is not paranoid or delusional. Thought content does not include homicidal or suicidal ideation. Thought content does not include suicidal plan.        Cognition and Memory: Cognition and memory normal.        Judgment: Judgment normal.     Comments: Insight intact Hyperactive style less intense and less depressed Stressed.  Lab Review:     Component Value Date/Time   NA 130 (L) 04/17/2021 1825   NA 134 06/14/2018 1444   K 3.8 04/17/2021 1825   CL 97 (L) 04/17/2021 1825   CO2 26 04/17/2021 1825   GLUCOSE 92 04/17/2021 1825   BUN 15 04/17/2021 1825   BUN 10 06/14/2018 1444   CREATININE 1.00 04/17/2021 1825   CREATININE 0.76 04/10/2020 1431   CALCIUM 9.0 04/17/2021 1825   PROT 6.8 02/25/2021 1614   PROT 6.5 12/19/2020 0754   ALBUMIN 4.1 02/25/2021 1614   ALBUMIN 4.5 12/19/2020 0754   AST 16 02/25/2021 1614   ALT 12 02/25/2021 1614   ALKPHOS 47 02/25/2021 1614   BILITOT 0.3 02/25/2021 1614   BILITOT 0.4 12/19/2020 0754   GFRNONAA 60 (L) 04/17/2021 1825   GFRNONAA 79 04/10/2020 1431   GFRAA 91 04/10/2020 1431       Component Value Date/Time   WBC 5.0 04/17/2021 1825   RBC 3.53 (L) 04/17/2021 1825   HGB 11.1 (L) 04/17/2021 1825   HCT 33.4 (L) 04/17/2021 1825   PLT 318 04/17/2021 1825   MCV 94.6 04/17/2021 1825   MCH 31.4 04/17/2021 1825   MCHC 33.2 04/17/2021 1825   RDW 12.5 04/17/2021 1825   LYMPHSABS 2.0 06/14/2018 0005   MONOABS 0.7 06/14/2018 0005   EOSABS 0.2 06/14/2018 0005   BASOSABS 0.0 06/14/2018 0005    No results found for: POCLITH, LITHIUM   No results found for: PHENYTOIN, PHENOBARB, VALPROATE, CBMZ   06/04/20 B12 normal at 496  01/21/2021 imipramine level 25,  desipramine level 40 equals total 65 which is low on 75 mg daily. (Goal 150-250)  .res Assessment: Plan:    Haelie was seen today for follow-up, anxiety and depression.  Diagnoses and all orders for this visit:  Major depressive disorder, recurrent episode, moderate (HCC) -     imipramine (TOFRANIL) 50 MG tablet; Take 4 tablets (200 mg total) by mouth at bedtime.  Generalized anxiety disorder -     imipramine (TOFRANIL) 50 MG tablet; Take 4 tablets (200 mg total) by mouth at bedtime.  Attention deficit hyperactivity disorder (ADHD), combined type -     amphetamine-dextroamphetamine (ADDERALL) 10 MG tablet; Take 1 tablet (10 mg total) by mouth 2 (two) times daily with a meal.   Greater than 50% of 30 min face to face time with patient was spent on counseling and coordination of care. We discussed  Genesight testing and her desire to take something for anxiety that is in the "Use as Directed" column.    Consider TCA bc Genesight test;  disc SE in detail incl cardiac, etc Imipramine is helpful but increase could be more helpful based on level.  Disc pros and cons on increase.  She wants to increase it..  Consider lithium augmentation. Increase imipramine to 200 mg nightly, based on prior level.   B12 level checked 496 and normal.  Disc light therapy in detail and gave handout.     Adderall prn.  Reduce Ativan 1/2 mg tid prn since she is tired from it and not on Adderall usually.    Consider alternatives for anger outbursts, impulsivity like lithium, VPA, atypical.  Supportive therapy around marital crisis and other stressors.  She needs some goals.  Needs to get out of the house chronically.  Started therapy with God[facility] Pick.  FU 4 weeks  Lynder Parents, MD, DFAPA   Please see After Visit Summary for patient specific instructions.  Future Appointments  Date Time Provider [place]  06/26/2021  9:30 AM Cottle, Billey Co., MD [facility] None  07/22/2021 11:20 AM  Plotnikov, Evie Lacks, MD [facility] None  08/01/2021 10:00 AM Cottle, Billey Co., MD [facility] None  08/30/2021  8:00 AM Belva Crome, MD [facility]    No orders of the defined types were placed in this encounter.     -------------------------------

## 2021-06-04 ENCOUNTER — Other Ambulatory Visit: Payer: Self-pay | Admitting: Psychiatry

## 2021-06-04 ENCOUNTER — Telehealth: Payer: Self-pay

## 2021-06-04 DIAGNOSIS — F902 Attention-deficit hyperactivity disorder, combined type: Secondary | ICD-10-CM

## 2021-06-04 MED ORDER — AMPHETAMINE-DEXTROAMPHETAMINE 5 MG PO TABS: 10.0000 mg | ORAL_TABLET | Freq: Two times a day (BID) | ORAL | 0 refills | Status: DC

## 2021-06-04 NOTE — Telephone Encounter (Signed)
Pt called to report her pharmacy did not have any 10 mg Adderall but they had 20 mg or 5 mg. She would prefer 5 mg if possible. Dr. Clovis Pu submitted new Rx.  Left pt message it was sent in.

## 2021-06-06 ENCOUNTER — Telehealth: Payer: Self-pay

## 2021-06-06 NOTE — Telephone Encounter (Signed)
VM for follow up regarding inbound referral.

## 2021-06-10 DIAGNOSIS — M150 Primary generalized (osteo)arthritis: Secondary | ICD-10-CM | POA: Diagnosis not present

## 2021-06-10 DIAGNOSIS — Z6823 Body mass index (BMI) 23.0-23.9, adult: Secondary | ICD-10-CM | POA: Diagnosis not present

## 2021-06-10 DIAGNOSIS — M0579 Rheumatoid arthritis with rheumatoid factor of multiple sites without organ or systems involvement: Secondary | ICD-10-CM | POA: Diagnosis not present

## 2021-06-10 DIAGNOSIS — M2550 Pain in unspecified joint: Secondary | ICD-10-CM | POA: Diagnosis not present

## 2021-06-10 DIAGNOSIS — Z79899 Other long term (current) drug therapy: Secondary | ICD-10-CM | POA: Diagnosis not present

## 2021-06-11 DIAGNOSIS — R269 Unspecified abnormalities of gait and mobility: Secondary | ICD-10-CM | POA: Diagnosis not present

## 2021-06-11 DIAGNOSIS — M25562 Pain in left knee: Secondary | ICD-10-CM | POA: Diagnosis not present

## 2021-06-12 ENCOUNTER — Encounter: Payer: Self-pay | Admitting: Internal Medicine

## 2021-06-14 DIAGNOSIS — R269 Unspecified abnormalities of gait and mobility: Secondary | ICD-10-CM | POA: Diagnosis not present

## 2021-06-14 DIAGNOSIS — M25562 Pain in left knee: Secondary | ICD-10-CM | POA: Diagnosis not present

## 2021-06-18 ENCOUNTER — Telehealth: Payer: Self-pay | Admitting: Internal Medicine

## 2021-06-18 NOTE — Telephone Encounter (Signed)
Rep [facility]/ [facility] checking status of rx clarification fax sent 06-17-2021  Rep states pharmacy is unable to make Testosterone 1.62 % GEL due to compounding  Rep requesting authorization to fill Testosterone 2 % GEL   Phone [phone]  Fax [phone]

## 2021-06-19 ENCOUNTER — Ambulatory Visit: Payer: Medicare Other | Admitting: Internal Medicine

## 2021-06-19 NOTE — Telephone Encounter (Signed)
See below

## 2021-06-20 NOTE — Telephone Encounter (Signed)
I will have to decline.  I am not comfortable managing bioidentical hormones. Cecelia will need to see  Alexander T. Augoustides, MD - [facility] or someone else who does it. Thanks

## 2021-06-20 NOTE — Telephone Encounter (Signed)
Pharmacy checking status of rx clarification request

## 2021-06-20 NOTE — Telephone Encounter (Signed)
Pt checking status of rx clarification request, pt states she is in need of the medication

## 2021-06-24 ENCOUNTER — Encounter: Payer: Self-pay | Admitting: Gastroenterology

## 2021-06-26 ENCOUNTER — Ambulatory Visit: Payer: Medicare Other | Admitting: Psychiatry

## 2021-06-27 ENCOUNTER — Ambulatory Visit (INDEPENDENT_AMBULATORY_CARE_PROVIDER_SITE_OTHER): Payer: Medicare Other | Admitting: Internal Medicine

## 2021-06-27 ENCOUNTER — Encounter: Payer: Self-pay | Admitting: Internal Medicine

## 2021-06-27 ENCOUNTER — Other Ambulatory Visit: Payer: Self-pay

## 2021-06-27 DIAGNOSIS — I2510 Atherosclerotic heart disease of native coronary artery without angina pectoris: Secondary | ICD-10-CM

## 2021-06-27 DIAGNOSIS — J0101 Acute recurrent maxillary sinusitis: Secondary | ICD-10-CM | POA: Diagnosis not present

## 2021-06-27 DIAGNOSIS — Z889 Allergy status to unspecified drugs, medicaments and biological substances status: Secondary | ICD-10-CM | POA: Diagnosis not present

## 2021-06-27 DIAGNOSIS — I776 Arteritis, unspecified: Secondary | ICD-10-CM

## 2021-06-27 DIAGNOSIS — J019 Acute sinusitis, unspecified: Secondary | ICD-10-CM | POA: Insufficient documentation

## 2021-06-27 MED ORDER — AMOXICILLIN-POT CLAVULANATE 875-125 MG PO TABS: 1.0000 | ORAL_TABLET | Freq: Two times a day (BID) | ORAL | 0 refills | Status: DC

## 2021-06-27 MED ORDER — FLUCONAZOLE 150 MG PO TABS: 150.0000 mg | ORAL_TABLET | Freq: Once | ORAL | 1 refills | Status: AC

## 2021-06-27 NOTE — Assessment & Plan Note (Signed)
Can take Augmentin OK ?Intolerant of many abx ?

## 2021-06-27 NOTE — Assessment & Plan Note (Signed)
Easy bruising - persistent ?

## 2021-06-27 NOTE — Progress Notes (Signed)
Subjective:  Patient ID: Courtney [name], female    DOB: 1950-09-09  Age: 71 y.o. MRN: [id number]  CC: Nasal Congestion (Green mucus, nasal drainage for 3 weeks) and Sore Throat (Pt states the inside of her jaw has been hurting on and off for about 3 weeks. )   HPI Courtney [name] presents for thick sinus mucus, ear discomfort x 3 weeks  Outpatient Medications Prior to Visit  Medication Sig Dispense Refill   amphetamine-dextroamphetamine (ADDERALL) 5 MG tablet Take 2 tablets (10 mg total) by mouth 2 (two) times daily with a meal. 120 tablet 0   betamethasone dipropionate 0.05 % cream Apply topically 2 (two) times daily. 45 g 1   estradiol (ESTRACE) 1 MG tablet Take 1.5 tablets (1.5 mg total) by mouth daily. 45 tablet 1   imipramine (TOFRANIL) 50 MG tablet Take 4 tablets (200 mg total) by mouth at bedtime. 120 tablet 1   LORazepam (ATIVAN) 1 MG tablet Take 1 tablet (1 mg total) by mouth every 8 (eight) hours. 90 tablet 1   meloxicam (MOBIC) 7.5 MG tablet Take 1-2 tablets (7.5-15 mg total) by mouth daily as needed for pain. 180 tablet 1   methocarbamol (ROBAXIN) 500 MG tablet Take 1 tablet (500 mg total) by mouth every 6 (six) hours as needed for muscle spasms. 40 tablet 0   nitroGLYCERIN (NITROSTAT) 0.4 MG SL tablet Place 1 tablet (0.4 mg total) under the tongue every 5 (five) minutes as needed for chest pain. 25 tablet 2   NP THYROID 60 MG tablet Take 1 tablet (60 mg total) by mouth daily before breakfast. 30 tablet 1   omega-3 acid ethyl esters (LOVAZA) 1 g capsule Take 2 capsules (2 g total) by mouth 2 (two) times daily. 120 capsule 11   ondansetron (ZOFRAN) 4 MG tablet Take 1 tablet (4 mg total) by mouth every 8 (eight) hours as needed for nausea or vomiting. 40 tablet 1   pravastatin (PRAVACHOL) 20 MG tablet Take one tablet by mouth 4 days per week 48 tablet 3   progesterone (PROMETRIUM) 200 MG capsule TAKE 2 CAPSULES(400 MG) BY MOUTH AT BEDTIME 180 capsule 0   Testosterone  1.62 % GEL Place 5 mg onto the skin daily. 30 g 3   traMADol (ULTRAM) 50 MG tablet TAKE 1/2 TO 1 TABLET(25 TO 50 MG) BY MOUTH BACK EVERY 8 HOURS AS NEEDED FOR SEVERE PAIN 90 tablet 0   vitamin E 400 UNIT capsule Take 800 Units by mouth daily.      pantoprazole (PROTONIX) 40 MG tablet Take 1 tablet (40 mg total) by mouth daily. (Patient not taking: Reported on 06/27/2021) 90 tablet 3   No facility-administered medications prior to visit.    ROS: Review of Systems  Constitutional:  Negative for activity change, appetite change, chills, fatigue and unexpected weight change.  HENT:  Positive for postnasal drip, sinus pressure, sinus pain and sore throat. Negative for congestion and mouth sores.   Eyes:  Negative for visual disturbance.  Respiratory:  Negative for cough and chest tightness.   Gastrointestinal:  Negative for abdominal pain and nausea.  Genitourinary:  Negative for difficulty urinating, frequency and vaginal pain.  Musculoskeletal:  Positive for arthralgias. Negative for back pain and gait problem.  Skin:  Negative for pallor and rash.  Neurological:  Negative for dizziness, tremors, weakness, numbness and headaches.  Psychiatric/Behavioral:  Negative for confusion and sleep disturbance.    Objective:  BP 124/78 (BP Location: Left Arm, Patient Position: Sitting, Cuff  Size: Normal)    Pulse 86    Temp 98.3 F (36.8 C) (Oral)    Ht 4\' 11"  (1.499 m)    Wt 119 lb (54 kg)    LMP 01/08/1999    SpO2 96%    BMI 24.04 kg/m   BP Readings from Last 3 Encounters:  06/27/21 124/78  04/30/21 124/82  04/18/21 136/61    Wt Readings from Last 3 Encounters:  06/27/21 119 lb (54 kg)  04/30/21 113 lb (51.3 kg)  04/17/21 116 lb (52.6 kg)    Physical Exam Constitutional:      General: She is not in acute distress.    Appearance: She is well-developed.  HENT:     Head: Normocephalic.     Right Ear: External ear normal.     Left Ear: External ear normal.     Nose: Nose normal.  Eyes:      General:        Right eye: No discharge.        Left eye: No discharge.     Conjunctiva/sclera: Conjunctivae normal.     Pupils: Pupils are equal, round, and reactive to light.  Neck:     Thyroid: No thyromegaly.     Vascular: No JVD.     Trachea: No tracheal deviation.  Cardiovascular:     Rate and Rhythm: Normal rate and regular rhythm.     Heart sounds: Normal heart sounds.  Pulmonary:     Effort: No respiratory distress.     Breath sounds: No stridor. No wheezing.  Abdominal:     General: Bowel sounds are normal. There is no distension.     Palpations: Abdomen is soft. There is no mass.     Tenderness: There is no abdominal tenderness. There is no guarding or rebound.  Musculoskeletal:        General: No tenderness.     Cervical back: Normal range of motion and neck supple. No rigidity.  Lymphadenopathy:     Cervical: No cervical adenopathy.  Skin:    Findings: No erythema or rash.  Neurological:     Cranial Nerves: No cranial nerve deficit.     Motor: No abnormal muscle tone.     Coordination: Coordination normal.     Deep Tendon Reflexes: Reflexes normal.  Psychiatric:        Behavior: Behavior normal.        Thought Content: Thought content normal.        Judgment: Judgment normal.    Lab Results  Component Value Date   WBC 5.0 04/17/2021   HGB 11.1 (L) 04/17/2021   HCT 33.4 (L) 04/17/2021   PLT 318 04/17/2021   GLUCOSE 92 04/17/2021   CHOL 174 05/01/2021   TRIG 72 05/01/2021   HDL 78 05/01/2021   LDLDIRECT 120.7 08/04/2012   LDLCALC 82 05/01/2021   ALT 12 02/25/2021   AST 16 02/25/2021   NA 130 (L) 04/17/2021   K 3.8 04/17/2021   CL 97 (L) 04/17/2021   CREATININE 1.00 04/17/2021   BUN 15 04/17/2021   CO2 26 04/17/2021   TSH 0.35 (L) 08/27/2020   INR 1.0 07/06/2020   HGBA1C 5.4 02/11/2018    VAS US CAROTID  Result Date: 05/02/2021 Carotid Arterial Duplex Study Patient Name:  Courtney [name]  Date of Exam:   05/01/2021 Medical Rec #:  [id number]             Accession #:    [id number] Date of Birth: 06-12-1950  Patient Gender: F Patient Age:   55 years Exam Location:  [facility] Procedure:      VAS US CAROTID Referring Phys: JILL MCDANIEL --------------------------------------------------------------------------------  Indications:       Carotid artery disease and patient denies any cerebrovascular                    symptoms. Risk Factors:      Hyperlipidemia, past history of smoking, coronary artery                    disease. Comparison Study:  In 12/2016, a carotid duplex showed velocities of 69/25 cm/s                    in the RICA 62/95 cm/s in the LICA. Performing Technologist: Sharlett Iles RVT  Examination Guidelines: A complete evaluation includes B-mode imaging, spectral Doppler, color Doppler, and power Doppler as needed of all accessible portions of each vessel. Bilateral testing is considered an integral part of a complete examination. Limited examinations for reoccurring indications may be performed as noted.  Right Carotid Findings: +----------+--------+--------+--------+------------------+--------+             PSV cm/s EDV cm/s Stenosis Plaque Description Comments  +----------+--------+--------+--------+------------------+--------+  CCA Prox   49       5                                              +----------+--------+--------+--------+------------------+--------+  CCA Distal 60       13                                             +----------+--------+--------+--------+------------------+--------+  ICA Prox   50       11       Normal                                +----------+--------+--------+--------+------------------+--------+  ICA Mid    65       16                                             +----------+--------+--------+--------+------------------+--------+  ICA Distal 81       24                                             +----------+--------+--------+--------+------------------+--------+  ECA        74       6                                               +----------+--------+--------+--------+------------------+--------+ +----------+--------+-------+----------------+-------------------+             PSV cm/s EDV cms Describe         Arm Pressure (mmHG)  +----------+--------+-------+----------------+-------------------+  Subclavian 97  Multiphasic, WNL 122                  +----------+--------+-------+----------------+-------------------+ +---------+--------+--+--------+--+---------+  Vertebral PSV cm/s 87 EDV cm/s 14 Antegrade  +---------+--------+--+--------+--+---------+ Stable RICA velocities when compared to the prior exam. Left Carotid Findings: +----------+--------+--------+--------+------------------+--------+             PSV cm/s EDV cm/s Stenosis Plaque Description Comments  +----------+--------+--------+--------+------------------+--------+  CCA Prox   101      14                                             +----------+--------+--------+--------+------------------+--------+  CCA Distal 83       17                                             +----------+--------+--------+--------+------------------+--------+  ICA Prox   44       11       Normal                                +----------+--------+--------+--------+------------------+--------+  ICA Mid    67       21                                             +----------+--------+--------+--------+------------------+--------+  ICA Distal 59       16                                   tortuous  +----------+--------+--------+--------+------------------+--------+  ECA        105      10                heterogenous                 +----------+--------+--------+--------+------------------+--------+ +----------+--------+--------+----------------+-------------------+             PSV cm/s EDV cm/s Describe         Arm Pressure (mmHG)  +----------+--------+--------+----------------+-------------------+  Subclavian 153               Multiphasic, WNL 122                   +----------+--------+--------+----------------+-------------------+ +---------+--------+--+--------+--+---------+  Vertebral PSV cm/s 77 EDV cm/s 16 Antegrade  +---------+--------+--+--------+--+---------+ Stable LICA velocities when compared to the prior exam.  Summary: Right Carotid: There is no evidence of stenosis in the right ICA. The                extracranial vessels were near-normal with only minimal wall                thickening or plaque. Left Carotid: There is no evidence of stenosis in the left ICA. Hemodynamically               significant plaque >50% visualized in the CCA. The extracranial               vessels were near-normal with only minimal wall thickening or  plaque. Vertebrals:  Bilateral vertebral arteries demonstrate antegrade flow. Subclavians: Normal flow hemodynamics were seen in bilateral subclavian              arteries. *See table(s) above for measurements and observations.  Electronically signed by Carlyle Dolly MD on 05/02/2021 at 3:53:58 PM.    Final     Assessment & Plan:   Problem List Items Addressed This Visit     Acute sinusitis    Can take Augmentin OK Intolerant of many abx      Relevant Medications   amoxicillin-clavulanate (AUGMENTIN) 875-125 MG tablet   fluconazole (DIFLUCAN) 150 MG tablet   Multiple drug allergies    Can take Augmentin OK Intolerant of many abx      Vasculitis (HCC)    Easy bruising - persistent         Meds ordered this encounter  Medications   amoxicillin-clavulanate (AUGMENTIN) 875-125 MG tablet    Sig: Take 1 tablet by mouth 2 (two) times daily.    Dispense:  20 tablet    Refill:  0   fluconazole (DIFLUCAN) 150 MG tablet    Sig: Take 1 tablet (150 mg total) by mouth once for 1 dose.    Dispense:  1 tablet    Refill:  1      Follow-up: No follow-ups on file.  Walker Kehr, MD

## 2021-07-01 ENCOUNTER — Ambulatory Visit: Payer: Medicare Other | Admitting: Internal Medicine

## 2021-07-04 DIAGNOSIS — R269 Unspecified abnormalities of gait and mobility: Secondary | ICD-10-CM | POA: Diagnosis not present

## 2021-07-04 DIAGNOSIS — M25562 Pain in left knee: Secondary | ICD-10-CM | POA: Diagnosis not present

## 2021-07-05 ENCOUNTER — Telehealth: Payer: Self-pay

## 2021-07-05 NOTE — Telephone Encounter (Signed)
Pt called to report she is having a lot of dry mouth with her Imipramine. She is trying to figure out if she should decrease the dose to 3 daily. She has been told by her other doctors to drink less water due to having low sodium, but pt feels she can't drink less or her mouth dryness will get worse. Pt is concerned and asking what Dr. Clovis Pu recommends. ?

## 2021-07-05 NOTE — Telephone Encounter (Signed)
Notified patient. She said the issue is more complex than just dry mouth. She has been having GI issues and has been very sick, had a splenic bleed, is unable to get out of bed at times. Dr[facility] Derrek Monaco at [facility] is her GI doctor. Patient spoke to her after Tressia Miners talked to her today and is wanting her to stop the imipramine to eliminate that as a source of her issues.  Patient said she would like to talk to you by Monday.  ?

## 2021-07-05 NOTE — Telephone Encounter (Signed)
We increased the imipramine from 3 of the 50 mg tablets to 4 of the 50 mg tablets on February 1.  If she has seen additional improvement in her mood it is to her advantage to continue the current dosage.  If she has not seen additional improvement since increasing the dose she can drop the imipramine back to 3 tablets daily. ?In addition to that she should discuss the dry mouth problem with her pharmacist as the pharmacist may have some other recommendations to help manage it.  But the usual recommendation is to switch her toothpaste to Biotene toothpaste and use the Biotene mouthwash and Biotene spray to manage the dry mouth as best she can. ?

## 2021-07-08 NOTE — Telephone Encounter (Signed)
Patient notified of recommendations. She still c/o not being able to get out of bed due to weakness. When asked if she was seeing blood in her stools she said she wasn't having any stools.  ?

## 2021-07-08 NOTE — Telephone Encounter (Signed)
Okay she can come off the imipramine better mood anxiety and irritability will get worse off of it because it helped her.  However we cannot start any new medicines until we see if her physical symptoms are any different off the medicine.  She can reduce imipramine by 1 tablet every 3 to 4 days.  The dry mouth will not resolve until she is off the medicine.  The splenic bleed is not related to imipramine unless she has some bizarre sort of allergy. ?

## 2021-07-09 DIAGNOSIS — M25511 Pain in right shoulder: Secondary | ICD-10-CM | POA: Diagnosis not present

## 2021-07-10 DIAGNOSIS — M19011 Primary osteoarthritis, right shoulder: Secondary | ICD-10-CM | POA: Diagnosis not present

## 2021-07-10 DIAGNOSIS — M25511 Pain in right shoulder: Secondary | ICD-10-CM | POA: Diagnosis not present

## 2021-07-10 DIAGNOSIS — S35299S Unspecified injury of branches of celiac and mesenteric artery, sequela: Secondary | ICD-10-CM | POA: Diagnosis not present

## 2021-07-10 DIAGNOSIS — R5383 Other fatigue: Secondary | ICD-10-CM | POA: Diagnosis not present

## 2021-07-11 DIAGNOSIS — M25562 Pain in left knee: Secondary | ICD-10-CM | POA: Diagnosis not present

## 2021-07-11 DIAGNOSIS — R269 Unspecified abnormalities of gait and mobility: Secondary | ICD-10-CM | POA: Diagnosis not present

## 2021-07-12 ENCOUNTER — Telehealth: Payer: Self-pay | Admitting: Gastroenterology

## 2021-07-12 NOTE — Telephone Encounter (Signed)
Good Morning Dr. Ardis Hughs,  ? ? ?This patient as a referral in from Dr. Helane Rima for abdomen pain. Dr. Helane Rima is requesting patient to be seen by you. Patient was scheduled for 3/21 for a OV with you. By my mistake I did not realize that she had been seen with [facility] within the last year. Records are in Epic, are you willing to take on care for this patient or do I need to contact her to cancel? ? ? ?Please advise, thank you. ?

## 2021-07-16 ENCOUNTER — Ambulatory Visit: Payer: Medicare Other | Admitting: Gastroenterology

## 2021-07-22 ENCOUNTER — Ambulatory Visit: Payer: Medicare Other | Admitting: Internal Medicine

## 2021-07-30 DIAGNOSIS — F411 Generalized anxiety disorder: Secondary | ICD-10-CM | POA: Diagnosis not present

## 2021-07-31 ENCOUNTER — Ambulatory Visit (INDEPENDENT_AMBULATORY_CARE_PROVIDER_SITE_OTHER): Payer: Medicare Other | Admitting: Family Medicine

## 2021-07-31 ENCOUNTER — Encounter: Payer: Self-pay | Admitting: Family Medicine

## 2021-07-31 VITALS — BP 118/74 | HR 90 | Temp 97.6°F | Ht 59.0 in | Wt 115.2 lb

## 2021-07-31 DIAGNOSIS — I2510 Atherosclerotic heart disease of native coronary artery without angina pectoris: Secondary | ICD-10-CM | POA: Diagnosis not present

## 2021-07-31 DIAGNOSIS — J3489 Other specified disorders of nose and nasal sinuses: Secondary | ICD-10-CM

## 2021-07-31 DIAGNOSIS — R0981 Nasal congestion: Secondary | ICD-10-CM

## 2021-07-31 DIAGNOSIS — Z9109 Other allergy status, other than to drugs and biological substances: Secondary | ICD-10-CM | POA: Diagnosis not present

## 2021-07-31 DIAGNOSIS — R067 Sneezing: Secondary | ICD-10-CM | POA: Diagnosis not present

## 2021-07-31 MED ORDER — LEVOCETIRIZINE DIHYDROCHLORIDE 5 MG PO TABS: 5.0000 mg | ORAL_TABLET | Freq: Every evening | ORAL | 1 refills | Status: DC

## 2021-07-31 MED ORDER — FLUTICASONE PROPIONATE 50 MCG/ACT NA SUSP: 2.0000 | Freq: Every day | NASAL | 1 refills | Status: DC

## 2021-07-31 NOTE — Patient Instructions (Addendum)
Try Flonase and Xyzal as prescribed.  ? ?You appear to have allergies to something in your town home since your symptoms resolve when you are not in the town home and return when you are there.  ?

## 2021-07-31 NOTE — Progress Notes (Signed)
?Subjective: ?Chief Complaint  ?Patient presents with  ? Nasal Congestion  ?  Runny nose, wheezing and sneezing, believes it to be the new townhome her and husband moved into. Has been going on since December.   ? ? ? Courtney [name] is a 71 y.o. female who presents for respiratory symptoms.    ?Symptoms include nasal congestion, rhinorrhea, sneezing, and post nasal drainage . States she has wheezing in her throat but not in her lungs. No cough, chest tightness or shortness of breath.  ?Denies  fever, chills, headache, sinus pain, ear pain, sore throat, chest pain, palpitations, shortness of breath, abdominal pain, N/V/D, LE edema . ? ?States her symptoms are only present when she is in her new town home.  States she went away for a week to [place] and her symptoms resolved completely.  States she returned and her symptoms returned as well.  States during the day when she is out of her townhome, she does not have symptoms.  She believes she is allergic to something in her townhome. ? ? ?Past history is significant for chronic bronchitis and pneumonia.   Patient is not a smoker. ?No other aggravating or relieving factors.  No other c/o. ? ?Past Medical History:  ?Diagnosis Date  ? ADD (attention deficit disorder)   ? Anxiety   ? Celiac disease   ? possible vs IBS  ? Complication of anesthesia   ? Coronary artery disease   ? Depression   ? no bipolar per Dr. [name]  ? GI problem   ? [name]  ? Gluten intolerance   ? Hyperlipidemia   ? IBS (irritable bowel syndrome)   ? Osteoarthritis   ? Osteoporosis   ? PONV (postoperative nausea and vomiting)   ? Rheumatoid arthritis ([place])   ? Seasonal allergies 01-08-12  ? hx. of multiple bronchitis related to this.  ? Shoulder pain, right   ? ? ?ROS as in subjective ? ? ?Objective: ?BP 118/74 (BP Location: Left Arm, Patient Position: Sitting, Cuff Size: Normal)   Pulse 90   Temp 97.6 ?F (36.4 ?C) (Temporal)   Ht '4\' 11"'$  (1.499 m)   Wt 115 lb 3.2 oz (52.3 kg)   LMP  01/08/1999   SpO2 97%   BMI 23.27 kg/m?  ? ?General appearance: Alert, WD/WN, no distress, well -appearing ?                            Skin: warm, no rash ?                          Head: no sinus tenderness ?                           Eyes: conjunctiva normal, corneas clear, PERRLA ?                           Ears: pearly TMs, external ear canals normal ?                         Nose: septum midline, turbinates swollen, with erythema and clear discharge ?            Mouth/throat: MMM, tongue normal, no pharyngeal erythema ?  Neck: supple, no adenopathy, no thyromegaly, non tender ?                         Heart: RRR, normal S1, S2, no murmurs ?                        Lungs: CTA bilaterally, no wheezes, rales, or rhonchi ? ?    ?Assessment  ?Encounter Diagnoses  ?Name Primary?  ? Nasal congestion with rhinorrhea   ? Sneezing   ? Environmental allergies Yes  ? ? ?  ?Plan: ?Medications prescribed today: ?Flonase ?Xyzal  ?Discussed that she appears to be having allergies to something in her townhome and does not appear to have an upper respiratory or bacterial infection.  Symptoms that are only present when she is home speak to this.  Recommend she try a nondrowsy antihistamine, states she does not want to try Claritin, I prescribed Xyzal. ?She may also try Flonase.  Follow-up if worsening or any new symptoms arise. ? ? ?Patient was advised to call or return if worse.  ? ?Patient voiced understanding of diagnosis, recommendations, and treatment plan. ? ?After visit summary given.  ? ?

## 2021-08-01 ENCOUNTER — Ambulatory Visit (INDEPENDENT_AMBULATORY_CARE_PROVIDER_SITE_OTHER): Payer: Medicare Other | Admitting: Psychiatry

## 2021-08-01 ENCOUNTER — Encounter: Payer: Self-pay | Admitting: Psychiatry

## 2021-08-01 DIAGNOSIS — D508 Other iron deficiency anemias: Secondary | ICD-10-CM

## 2021-08-01 DIAGNOSIS — I2510 Atherosclerotic heart disease of native coronary artery without angina pectoris: Secondary | ICD-10-CM

## 2021-08-01 DIAGNOSIS — F411 Generalized anxiety disorder: Secondary | ICD-10-CM | POA: Diagnosis not present

## 2021-08-01 DIAGNOSIS — F331 Major depressive disorder, recurrent, moderate: Secondary | ICD-10-CM | POA: Diagnosis not present

## 2021-08-01 DIAGNOSIS — F902 Attention-deficit hyperactivity disorder, combined type: Secondary | ICD-10-CM

## 2021-08-01 MED ORDER — AMPHETAMINE-DEXTROAMPHETAMINE 10 MG PO TABS: 10.0000 mg | ORAL_TABLET | Freq: Two times a day (BID) | ORAL | 0 refills | Status: DC

## 2021-08-01 MED ORDER — LITHIUM CARBONATE ER 300 MG PO TBCR: 300.0000 mg | EXTENDED_RELEASE_TABLET | Freq: Every evening | ORAL | 0 refills | Status: DC

## 2021-08-01 MED ORDER — IMIPRAMINE HCL 50 MG PO TABS: 150.0000 mg | ORAL_TABLET | Freq: Every day | ORAL | 0 refills | Status: DC

## 2021-08-01 NOTE — Progress Notes (Signed)
Keyerra Lamere ?[id number] ?1950/12/13 ?71 y.o. ? ?Subjective:  ? ?Patient ID:  Lajean Boese is a 71 y.o. (DOB 1950-10-24) female. ? ?Chief Complaint:  ?Chief Complaint  ?Patient presents with  ? Follow-up  ? Depression  ? Anxiety  ? ? ?HPI ?Shamyia Andres-Gregg presents to the office today for follow-up of first visit 03/01/2020 referred by a friend [name].  Was prescribed Viibryd and Vyvanse was changed to Concerta 54 mg to try to reduce the amount of dry mouth. ? ?05/02/2020 appointment with the following noted: ?Less dry mouth with Concerta but didn't seem to last beyond 3-4 PM. ?Viibryd 20 mg daily.  Hard to get enough calories at breakfast.  But has been taking both in the morning.  Some benefit with Viibryd. ?Is there something I can take besides a stimulant, bc fears it is stressing her body.  Dx college exhausted adrenal glands.  It helps.  Asked questions about alternatives to stimulants.  Reduced Concerta to reduce anxiety and see if can achieve, but she thinks it's worse in the evening. ?Anxiety is still a struggle.  Life crisis moment.  Chronic marital dissatisfaction worse now that both are retired.  Feels like she's in a prison.  Family notices she's stressed.  Has done therapy since age 10 yo.  Has done Hindu meditation without help.  Christian faith disciplines.  Grew up caretaking.  I'm done and I need to change.  ?Goal is calm down enough to function through the situation. ?Tingling foot and wants B12 testing. ?Plan: Switch Viibryd to 20 mg in evening meal to get better absorption. ? ?06/18/2020 appointment with following noted: ?No difference in anxiety or mood with Viibryd 20 mg daily.  She doesn't want to increase it. ?Questions about Viibryd and Genesight testing.  ?Asks about differences between Adderall and MPH.   ?Missed for 3 days Concerta and took Adderall.    ?Tired by 4-5 PM and doesn't like that.    ?Plan: She wants to wean off the Viibryd because she does not feel it has been  helpful and she does not want to increase the dosage. ? ?07/03/2020 phone call patient stating she wanted to continue Viibryd 20 mg daily.  She also requested refill of Concerta 54 mg ? ?5/85/2778 appointment with the following noted: ?Was told she couldn't take Viibryd with one of the pain meds and had TKR.  So stopped it.  Couldn't tolerate pain meds except tramadol.  Had more pain than expected.   ?Stopped Viibryd about 07/15/20.  She had taken tramadol with Viibryd in the past without a problem.   ?Noticing more pain after surgery in non-surgical places. ?Seems to be sensitive to getting lightheaded and confused with low blood sugar.   ?Poor attention and scattered since surgery.  Also more tired. ?Occ taken tramadol and otherwise just Tylenol. ?Impossible to tell the effect of the Viibryd given she hasn't been herself for months. ?Concerned about H's Geoff's memory who is also a patient here.  Wants him to get neuropsych testing. ?Plan because patient is med sensitive we will start very low-dose fluvoxamine 25 mg nightly ?Per her request continue Concerta 54 mg every morning ? ?10/25/2020 appointment with the following noted: ?Several phone calls since being here.  The first led to increasing fluvoxamine to 1-1/2 of the 25 mg tablets due to lack of effect at 3 weeks. ?09/21/2020 she called asking to stop Concerta which she did. ?She called again wanting to start Adderall.  Because she had taken  it in 2019 it was agreed that she could start Adderall 10 mg twice daily. ?Stress dealing with husband and whether to move or not.  May separate.  Anxiety is very high.  Hard to calm down around him. $ stress.  Explodes with anger at husband.  Doesn't think he can sell the house on his own. ?Never been this stressed and anxious and in this kind of circumstance before. ?Never got the Adderall DT need for PA. ?Only caffeine in the AM ?Plan: Rec increase Luvox from 37.5 mg daily to 50 mg HS for a week then increase to 75 mg  daily and possibly higher. ? B12 level checked 496 and normal. ?Hold Adderall until the anxiety is under control.  ? ?12/03/2020 phone call from patient: Patient called stating fluvoxamine made her more anxious and high strung and she stopped it.  She wanted an earlier appointment which was not available at the time she was given the option to see a nurse practitioner but refused. ? ?12/19/2020 appointment with the following noted: ?Life very stressful right now and not getting better.  B in law died and marriage falling apart. ?Separating.   ?More than I can handle including anxiety and depression. ?Plan: Rec trial beta blocker propranolol 10-30 mg  twice daily for anxiety ?Consider TCA bc Genesight test ? B12 level checked 496 and normal. ?Hold Adderall until the anxiety is under control.  ? ?01/01/2021 phone call from patient with nurse as follows: She is taking up to 30 mg propranolol twice daily without sufficient benefit for her anxiety. ? ?01/03/2021 appointment with the following noted: ?Rare Adderall.  Propranolol didn't help anxiety much at 30 mg BID without SE..   ?Thinks she's gotten depressed which is unusual.  In a perfect storm with divorce and moving and financial stress.  Not functioning well.  Hard to make a decision.  Poor productivity.   ?Having a lot of pain, chronically and worse lately. ?Starting Lyrica today. ?Taking alprazolam about 0.5 mg daily. ?Plan: Yes imipramine 25 and increase to 75 mg HS and then check blood level ? ?02/26/21 appt noted: ?01/21/2021 serum imipramine and desipramine total was 65 at 75 mg daily. ?It has made a difference.  But has made so many changes and moving.  Stress goes to GI system.   Was in ER last night with GI px and may have ulcer.  Unable to eat without nausea.  Lump in the throat nausea. ?Mild taking the edge off.  Can still explode. ?Can't remember the SE at 100 mg daily. ?GI dominating with pain and nausea. ?Sleep varies from good or bothered by sickness. Usually  fine but sometimes can't turn her brain off.  Moves in 5 days. ?Plan: Continue imipramine and increase to 100 mg again as soon as nausea managed. ?Add risperidone 1 mg HS off label for nausea and anxiety. ? ?03/28/2021 appt noted: ?She is not taking the risperidone.  Tried it for a couple of weeks but did not help the nausea.  Didn't notice it helping anxiety but had tremor.  Off for 2 weeks and tremor better. ?Still on imipramine 75 mg nightly.  She did increase but wasn't sure it was causing the tremor as instructed. ?This week to GI and work up ordered and doubled med dose. ?Not handling the transition well.  Very irritable with husband.  Such a sense of urgency.   ?Not often with Xanax.   ?Not on Adderall.   ?Plan: imipramine and increase to 100  mg nightly. ? B12 level checked 496 and normal. ?Hold Adderall until the anxiety is under control.  ?Switch Xanax to Ativan 1 mg tid since she is not on Adderall.   ? ?05/02/2021 appt noted: ?Increased imipramine 100 mg HS. Tolerated the increase ?Spleen bleed. ?Yesterday decent energy compared to what she had.  Ativan made her sleepy but only took it once. ?Sleeping a lot DT spleen injury. ?Can't tell about depression bc of injury kept her in bed.  Has to move to feel normal. ?Plan: imipramine and increase to 150 mg nightly, based on prior level. ? ?05/29/21 appt noted: ?SE dry mouth ?Increased imipramine 150 mg HS ?Seen improvement in mood.  Meeting irritating situations with less response.  Better self control.  Less fear and anxiety. ?Ativan 1 mg prn makes her too sleepy and tired. ?Occ taken 1/2 Adderall with some energy and motivation.   ?GI work up ongoing with gastric emptying yesterday.  It's delayed ongoing.  GI doctor has commented on her hostility.   ?Sleeping really well.   ?Worst dep and anxiety was when moving and now 50% better.  Need to have some goals and since moving into townhouse and unknown what is next.  May move out of town to be near the kids at part  of the year but it's too cold.  Big decisions are hard. ?Difficulty with back pain so hasn't unpacked.   ?More hopeful about her health. ?Seasonal depression ?Plan: Imipramine is helpful but increase co

## 2021-08-13 ENCOUNTER — Ambulatory Visit (INDEPENDENT_AMBULATORY_CARE_PROVIDER_SITE_OTHER): Payer: Medicare Other | Admitting: Gastroenterology

## 2021-08-13 ENCOUNTER — Encounter: Payer: Self-pay | Admitting: Gastroenterology

## 2021-08-13 VITALS — BP 126/70 | HR 96 | Ht 59.0 in | Wt 118.2 lb

## 2021-08-13 DIAGNOSIS — K5900 Constipation, unspecified: Secondary | ICD-10-CM

## 2021-08-13 DIAGNOSIS — R109 Unspecified abdominal pain: Secondary | ICD-10-CM | POA: Diagnosis not present

## 2021-08-13 NOTE — Progress Notes (Signed)
? ?HPI: ?This is a pleasant 71 year old woman who was referred to me by Dian Queen, MD  to evaluate chronic constipation, abdominal pains.   ? ?I am the third gastroenterologist she has seen in the past 3 to 4 months.  She had a colonoscopy and an upper endoscopy by Dr. Jerene Pitch at [facility] both 03/2021 gastroenterology for chronic abdominal pains.  The EGD was normal.  The colonoscopy found a single subcentimeter polyp which was removed.  I cannot find any pathology results from that polyp.  She unfortunately suffered "grade 3" splenic injury from the colonoscopy likely and had to have emergent interventional radiology splenic artery embolization. ? ?A couple months after that she established with a different gastroenterologist for her chronic abdominal pains and constipation and nausea.  It looks like she was put on Linzess and a gastric emptying scan was ordered and it showed slightly rapid gastric emptying. ? ?She also underwent an abdominal ultrasound 03/2021 and it was essentially normal.  No gallstones in her gallbladder. ?  ?CT scan abdomen pelvis with IV and oral contrast 01/26/2021 indication "abdominal pain" findings were essentially normal except for "moderate colonic stool burden." ? ? ?She has a long history of endometriosis.  She underwent open abdominal surgery at the age of 64 for endometrioma removals.  She has undergone at least 3 laparoscopic surgeries since then for the same.  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 ? ? ?Something changed several months ago causing her to have significant constipation and change in her bowel habits and intermittent severe abdominal pains.  Prior to that she would have a bowel movement once daily while taking Metamucil or other fiber supplement once daily.  Several months ago this change and she was only having a bowel movement every week or so this is with significant laxative use and enemas. ? ?She saw her  gastroenterologist Dr. Thana Farr.  She was planning to have a colonoscopy and upper endoscopy with him however he could not do that for very long and so she changed to care through [facility] and had a gastroenterologist Dr. Jerene Pitch performed those procedures and unfortunately she suffered a splenic laceration because of the colonoscopy. ? ?She has since completely recovered from all of these problems.  She is taking MiraLAX 1 dose once daily and on that regimen she has a BM every day or every other day.  Her abdominal pains are nearly completely gone.  She has gained 8 pounds in the past 3 to 4 months. ? ? ?Review of systems: ?Pertinent positive and negative review of systems were noted in the above HPI section. All other review negative. ? ? ?Past Medical History:  ?Diagnosis Date  ? ADD (attention deficit disorder)   ? Anxiety   ? Celiac disease   ? possible vs IBS  ? Complication of anesthesia   ? Coronary artery disease   ? Depression   ? no bipolar per Dr. [name]  ? GI problem   ? [name]  ? Gluten intolerance   ? Hyperlipidemia   ? IBS (irritable bowel syndrome)   ? Osteoarthritis   ? Osteoporosis   ? PONV (postoperative nausea and vomiting)   ? Rheumatoid arthritis ([place])   ? Seasonal allergies 01-08-12  ? hx. of multiple bronchitis related to this.  ? Shoulder pain, right   ? ? ?Past Surgical History:  ?Procedure Laterality Date  ? EXPLORATORY LAPAROTOMY  01-08-12  ? due to endometriosis-portions of both  ovaries removed  ? TONSILLECTOMY    ? TOTAL HIP ARTHROPLASTY    ? right. 2002  ? TOTAL HIP ARTHROPLASTY  01/14/2012  ? Procedure: TOTAL HIP ARTHROPLASTY;  Surgeon: Gearlean Alf, MD;  Location: [facility];  Service: Orthopedics;  Laterality: Left;  ? TOTAL KNEE ARTHROPLASTY Left 07/16/2020  ? Procedure: TOTAL KNEE ARTHROPLASTY;  Surgeon: Gaynelle Arabian, MD;  Location: [facility];  Service: Orthopedics;  Laterality: Left;  72mn  ? TOTAL SHOULDER ARTHROPLASTY Right   ? VENTRAL HERNIA REPAIR  01-08-12  ? abdominal   ? ? ?Current Outpatient Medications  ?Medication Instructions  ? amphetamine-dextroamphetamine (ADDERALL) 10 MG tablet 10 mg, Oral, 2 times daily with meals  ? amphetamine-dextroamphetamine (ADDERALL) 5 MG tablet 10 mg, Oral, 2 times daily with meals  ? fluticasone (FLONASE) 50 MCG/ACT nasal spray 2 sprays, Each Nare, Daily  ? imipramine (TOFRANIL) 150 mg, Oral, Daily at bedtime  ? levocetirizine (XYZAL) 5 mg, Oral, Every evening  ? lithium carbonate (LITHOBID) 300 mg, Oral, Nightly  ? LORazepam (ATIVAN) 1 mg, Oral, Every 8 hours  ? meloxicam (MOBIC) 7.5-15 mg, Oral, Daily PRN  ? methocarbamol (ROBAXIN) 500 mg, Oral, Every 6 hours PRN  ? nitroGLYCERIN (NITROSTAT) 0.4 mg, Sublingual, Every 5 min PRN  ? omega-3 acid ethyl esters (LOVAZA) 2 g, Oral, 2 times daily  ? ondansetron (ZOFRAN) 4 mg, Oral, Every 8 hours PRN  ? pravastatin (PRAVACHOL) 20 MG tablet Take one tablet by mouth 4 days per week  ? Testosterone 5 mg, Transdermal, Daily  ? traMADol (ULTRAM) 50 MG tablet TAKE 1/2 TO 1 TABLET(25 TO 50 MG) BY MOUTH BACK EVERY 8 HOURS AS NEEDED FOR SEVERE PAIN  ? vitamin E 800 Units, Oral, Daily  ? ? ?Allergies as of 08/13/2021 - Review Complete 08/13/2021  ?Allergen Reaction Noted  ? Clarithromycin Nausea Only 02/16/2007  ? Covid-19 (mrna) vaccine Other (See Comments) 01/23/2021  ? Other Other (See Comments) 05/28/2020  ? Bactrim [sulfamethoxazole-trimethoprim] Nausea And Vomiting 01/08/2012  ? Morphine Nausea And Vomiting 01/14/2012  ? Morphine and related Nausea And Vomiting 01/14/2012  ? Azithromycin Nausea And Vomiting and Nausea Only 01/05/2012  ? Ceclor [cefaclor] Nausea And Vomiting 01/05/2012  ? Claritin [loratadine]  08/16/2007  ? Codeine Nausea And Vomiting 09/26/2016  ? Cymbalta [duloxetine hcl]  10/22/2019  ? Erythromycin Nausea And Vomiting 01/05/2012  ? Erythromycin base Nausea And Vomiting 05/08/2020  ? Levofloxacin Nausea Only 02/16/2007  ? Lipitor [atorvastatin]  04/02/2021  ? Nitrofurantoin Nausea Only  10/04/2015  ? Oxycodone Other (See Comments) 07/17/2020  ? Penicillins Nausea And Vomiting 01/23/2021  ? Propoxyphene Nausea Only 02/16/2007  ? Propoxyphene n-acetaminophen Nausea Only 02/16/2007  ? ? ?Family History  ?Problem Relation Age of Onset  ? Heart disease Mother   ? Heart attack Mother   ? Prostate cancer Father   ? Heart disease Sister   ? Heart disease Maternal Grandmother   ? Breast cancer Paternal Grandmother   ? Colon cancer Paternal Grandfather   ? Coronary artery disease Other   ?     FH Female 1st degree relative <60  ? ADD / ADHD Other   ? ? ?Social History  ? ?Socioeconomic History  ? Marital status: Married  ?  Spouse name: Not on file  ? Number of children: Not on file  ? Years of education: Not on file  ? Highest education level: Not on file  ?Occupational History  ? Occupation: IFutures trader ?Tobacco Use  ? Smoking status:  Never  ? Smokeless tobacco: Never  ? Tobacco comments:  ?  only social  ?Vaping Use  ? Vaping Use: Never used  ?Substance and Sexual Activity  ? Alcohol use: Yes  ?  Alcohol/week: 0.0 standard drinks  ?  Comment: rare social   ? Drug use: No  ? Sexual activity: Yes  ?Other Topics Concern  ? Not on file  ?Social History Narrative  ? Married for last 42 years.Lives with husband.Retired Insurance account manager.Originally from [place].  ? ?Social Determinants of Health  ? ?Financial Resource Strain: Low Risk   ? Difficulty of Paying Living Expenses: Not hard at all  ?Food Insecurity: No Food Insecurity  ? Worried About Charity fundraiser in the Last Year: Never true  ? Ran Out of Food in the Last Year: Never true  ?Transportation Needs: No Transportation Needs  ? Lack of Transportation (Medical): No  ? Lack of Transportation (Non-Medical): No  ?Physical Activity: Sufficiently Active  ? Days of Exercise per Week: 5 days  ? Minutes of Exercise per Session: 30 min  ?Stress: No Stress Concern Present  ? Feeling of Stress : Not at all  ?Social Connections: Socially  Integrated  ? Frequency of Communication with Friends and Family: More than three times a week  ? Frequency of Social Gatherings with Friends and Family: More than three times a week  ? Attends Religious Services: 1 to 4

## 2021-08-13 NOTE — Patient Instructions (Signed)
If you are age 73 or older, your body mass index should be between 23-30. Your Body mass index is 23.88 kg/m?[name] If this is out of the aforementioned range listed, please consider follow up with your Primary Care Provider. ?________________________________________________________ ? ?The [place] [facility] providers would like to encourage you to use [facility] to communicate with providers for non-urgent requests or questions.  Due to long hold times on the telephone, sending your provider a message by [facility] may be a faster and more efficient way to get a response.  Please allow 48 business hours for a response.  Please remember that this is for non-urgent requests.  ?_______________________________________________________ ? ?Continue Miralax 1 to 2 times daily ? ?You will follow up in our office on an as needed basis. ? ?Thank you for entrusting me with your care and choosing [facility]. ? ?Dr Ardis Hughs ? ?

## 2021-08-16 DIAGNOSIS — F331 Major depressive disorder, recurrent, moderate: Secondary | ICD-10-CM | POA: Diagnosis not present

## 2021-08-16 DIAGNOSIS — F411 Generalized anxiety disorder: Secondary | ICD-10-CM | POA: Diagnosis not present

## 2021-08-20 ENCOUNTER — Ambulatory Visit (INDEPENDENT_AMBULATORY_CARE_PROVIDER_SITE_OTHER): Payer: Medicare Other | Admitting: Internal Medicine

## 2021-08-20 ENCOUNTER — Encounter: Payer: Self-pay | Admitting: Internal Medicine

## 2021-08-20 DIAGNOSIS — J4521 Mild intermittent asthma with (acute) exacerbation: Secondary | ICD-10-CM | POA: Diagnosis not present

## 2021-08-20 DIAGNOSIS — M5386 Other specified dorsopathies, lumbar region: Secondary | ICD-10-CM

## 2021-08-20 DIAGNOSIS — J45909 Unspecified asthma, uncomplicated: Secondary | ICD-10-CM | POA: Insufficient documentation

## 2021-08-20 DIAGNOSIS — M059 Rheumatoid arthritis with rheumatoid factor, unspecified: Secondary | ICD-10-CM

## 2021-08-20 DIAGNOSIS — J309 Allergic rhinitis, unspecified: Secondary | ICD-10-CM

## 2021-08-20 DIAGNOSIS — I2510 Atherosclerotic heart disease of native coronary artery without angina pectoris: Secondary | ICD-10-CM | POA: Diagnosis not present

## 2021-08-20 LAB — IMIPRAMINE WITH  DESIPRAMINE, BLOOD
Desipramine: 128 mcg/L
Imipramine Lvl: 110 mcg/L
Imipramine and Desipramine: 238 mcg/L (ref 150–250)

## 2021-08-20 MED ORDER — ALBUTEROL SULFATE HFA 108 (90 BASE) MCG/ACT IN AERS: 2.0000 | INHALATION_SPRAY | RESPIRATORY_TRACT | 3 refills | Status: AC | PRN

## 2021-08-20 MED ORDER — LEVOCETIRIZINE DIHYDROCHLORIDE 5 MG PO TABS: 5.0000 mg | ORAL_TABLET | Freq: Every evening | ORAL | 3 refills | Status: DC

## 2021-08-20 NOTE — Progress Notes (Signed)
Let her know that her imipramine level on 150 mg at night is solidly in the normal range.  I would recommend no dosage change because she may get more benefit from this medicine at this dosage if she gives it more time.

## 2021-08-20 NOTE — Progress Notes (Signed)
? ?Subjective:  ?Patient ID: Courtney [name], female    DOB: 03-Jul-1950  Age: 71 y.o. MRN: [id number] ? ?CC: Follow-up (Patient states that her hands are starting to shake in the last 60mo, Medication question (Rx for a medication in pill form for Voltaren), and Possible allergies or an reaction (Throat closing at times and dry mouth) ? ? ?HPI ?Courtney [name] presents for allergies - better on Flonase, Xyzal ?C/o "throat closing episodes", cough - used to have it in the past. It may last for a few minutes. No SOB. ? ?Outpatient Medications Prior to Visit  ?Medication Sig Dispense Refill  ? amphetamine-dextroamphetamine (ADDERALL) 5 MG tablet Take 2 tablets (10 mg total) by mouth 2 (two) times daily with a meal. 120 tablet 0  ? cyclobenzaprine (FLEXERIL) 10 MG tablet Take 10 mg by mouth 3 (three) times daily.    ? fluticasone (FLONASE) 50 MCG/ACT nasal spray Place 2 sprays into both nostrils daily. 16 g 1  ? imipramine (TOFRANIL) 50 MG tablet Take 3 tablets (150 mg total) by mouth at bedtime. 270 tablet 0  ? lithium carbonate (LITHOBID) 300 MG CR tablet Take 1 tablet (300 mg total) by mouth at bedtime. 90 tablet 0  ? meloxicam (MOBIC) 7.5 MG tablet Take 1-2 tablets (7.5-15 mg total) by mouth daily as needed for pain. 180 tablet 1  ? methocarbamol (ROBAXIN) 500 MG tablet Take 1 tablet (500 mg total) by mouth every 6 (six) hours as needed for muscle spasms. 40 tablet 0  ? omega-3 acid ethyl esters (LOVAZA) 1 g capsule Take 2 capsules (2 g total) by mouth 2 (two) times daily. 120 capsule 11  ? ondansetron (ZOFRAN) 4 MG tablet Take 1 tablet (4 mg total) by mouth every 8 (eight) hours as needed for nausea or vomiting. 40 tablet 1  ? pravastatin (PRAVACHOL) 20 MG tablet Take one tablet by mouth 4 days per week 48 tablet 3  ? Testosterone 1.62 % GEL Place 5 mg onto the skin daily. 30 g 3  ? traMADol (ULTRAM) 50 MG tablet TAKE 1/2 TO 1 TABLET(25 TO 50 MG) BY MOUTH BACK EVERY 8 HOURS AS NEEDED FOR SEVERE PAIN 90  tablet 0  ? vitamin E 400 UNIT capsule Take 800 Units by mouth daily.     ? amphetamine-dextroamphetamine (ADDERALL) 10 MG tablet Take 1 tablet (10 mg total) by mouth 2 (two) times daily with a meal. 60 tablet 0  ? levocetirizine (XYZAL) 5 MG tablet Take 1 tablet (5 mg total) by mouth every evening. 30 tablet 1  ? LINZESS 72 MCG capsule Take 72 mcg by mouth every morning.    ? LORazepam (ATIVAN) 1 MG tablet Take 1 tablet (1 mg total) by mouth every 8 (eight) hours. (Patient not taking: Reported on 08/20/2021) 90 tablet 1  ? nitroGLYCERIN (NITROSTAT) 0.4 MG SL tablet Place 1 tablet (0.4 mg total) under the tongue every 5 (five) minutes as needed for chest pain. (Patient not taking: Reported on 08/13/2021) 25 tablet 2  ? ?No facility-administered medications prior to visit.  ? ? ?ROS: ?Review of Systems  ?Constitutional:  Negative for activity change, appetite change, chills, fatigue and unexpected weight change.  ?HENT:  Negative for congestion, mouth sores and sinus pressure.   ?Eyes:  Negative for visual disturbance.  ?Respiratory:  Negative for cough and chest tightness.   ?Gastrointestinal:  Negative for abdominal pain and nausea.  ?Genitourinary:  Negative for difficulty urinating, frequency and vaginal pain.  ?Musculoskeletal:  Positive for arthralgias,  back pain, gait problem and neck stiffness.  ?Skin:  Negative for color change, pallor and rash.  ?Neurological:  Negative for dizziness, tremors, weakness, numbness and headaches.  ?Psychiatric/Behavioral:  Negative for confusion, sleep disturbance and suicidal ideas. The patient is not nervous/anxious.   ? ?Objective:  ?BP 128/72 (BP Location: Right Arm, Patient Position: Sitting, Cuff Size: Normal)   Pulse 71   Temp 98.2 ?F (36.8 ?C) (Oral)   Ht '4\' 11"'$  (1.499 m)   Wt 116 lb (52.6 kg)   LMP 01/08/1999   SpO2 95%   BMI 23.43 kg/m?  ? ?BP Readings from Last 3 Encounters:  ?08/20/21 128/72  ?08/13/21 126/70  ?07/31/21 118/74  ? ? ?Wt Readings from Last 3  Encounters:  ?08/20/21 116 lb (52.6 kg)  ?08/13/21 118 lb 4 oz (53.6 kg)  ?07/31/21 115 lb 3.2 oz (52.3 kg)  ? ? ?Physical Exam ?Constitutional:   ?   General: She is not in acute distress. ?   Appearance: She is well-developed.  ?HENT:  ?   Head: Normocephalic.  ?   Right Ear: External ear normal.  ?   Left Ear: External ear normal.  ?   Nose: Nose normal.  ?Eyes:  ?   General:     ?   Right eye: No discharge.     ?   Left eye: No discharge.  ?   Conjunctiva/sclera: Conjunctivae normal.  ?   Pupils: Pupils are equal, round, and reactive to light.  ?Neck:  ?   Thyroid: No thyromegaly.  ?   Vascular: No JVD.  ?   Trachea: No tracheal deviation.  ?Cardiovascular:  ?   Rate and Rhythm: Normal rate and regular rhythm.  ?   Heart sounds: Normal heart sounds.  ?Pulmonary:  ?   Effort: No respiratory distress.  ?   Breath sounds: No stridor. No wheezing.  ?Abdominal:  ?   General: Bowel sounds are normal. There is no distension.  ?   Palpations: Abdomen is soft. There is no mass.  ?   Tenderness: There is no abdominal tenderness. There is no guarding or rebound.  ?Musculoskeletal:     ?   General: Tenderness present.  ?   Cervical back: Normal range of motion and neck supple. No rigidity.  ?Lymphadenopathy:  ?   Cervical: No cervical adenopathy.  ?Skin: ?   Findings: No erythema or rash.  ?Neurological:  ?   Cranial Nerves: No cranial nerve deficit.  ?   Motor: No abnormal muscle tone.  ?   Coordination: Coordination normal.  ?   Deep Tendon Reflexes: Reflexes normal.  ?Psychiatric:     ?   Behavior: Behavior normal.     ?   Thought Content: Thought content normal.     ?   Judgment: Judgment normal.  ? ?LS w/pain ? ? ?Lab Results  ?Component Value Date  ? WBC 5.0 04/17/2021  ? HGB 11.1 (L) 04/17/2021  ? HCT 33.4 (L) 04/17/2021  ? PLT 318 04/17/2021  ? GLUCOSE 92 04/17/2021  ? CHOL 174 05/01/2021  ? TRIG 72 05/01/2021  ? HDL 78 05/01/2021  ? LDLDIRECT 120.7 08/04/2012  ? [place] 82 05/01/2021  ? ALT 12 02/25/2021  ? AST 16  02/25/2021  ? NA 130 (L) 04/17/2021  ? K 3.8 04/17/2021  ? CL 97 (L) 04/17/2021  ? CREATININE 1.00 04/17/2021  ? BUN 15 04/17/2021  ? CO2 26 04/17/2021  ? TSH 0.35 (L) 08/27/2020  ? INR  1.0 07/06/2020  ? HGBA1C 5.4 02/11/2018  ? ? ?VAS US CAROTID ? ?Result Date: 05/02/2021 ?Carotid Arterial Duplex Study Patient Name:  Aprill [name]  Date of Exam:   05/01/2021 Medical Rec #: [id number]             Accession #:    [id number] Date of Birth: 14-Jan-1951             Patient Gender: F Patient Age:   58 years Exam Location:  [facility] Procedure:      VAS US CAROTID Referring Phys: JILL MCDANIEL --------------------------------------------------------------------------------  Indications:       Carotid artery disease and patient denies any cerebrovascular                    symptoms. Risk Factors:      Hyperlipidemia, past history of smoking, coronary artery                    disease. Comparison Study:  In 12/2016, a carotid duplex showed velocities of 69/25 cm/s                    in the RICA 41/74 cm/s in the LICA. Performing Technologist: Sharlett Iles RVT  Examination Guidelines: A complete evaluation includes B-mode imaging, spectral Doppler, color Doppler, and power Doppler as needed of all accessible portions of each vessel. Bilateral testing is considered an integral part of a complete examination. Limited examinations for reoccurring indications may be performed as noted.  Right Carotid Findings: +----------+--------+--------+--------+------------------+--------+           PSV cm/sEDV cm/sStenosisPlaque DescriptionComments +----------+--------+--------+--------+------------------+--------+ CCA Prox  49      5                                          +----------+--------+--------+--------+------------------+--------+ CCA Distal60      13                                         +----------+--------+--------+--------+------------------+--------+ ICA Prox  50      11      Normal                              +----------+--------+--------+--------+------------------+--------+ ICA Mid   65      16                                         +----------+--------+--------+--------+------------------+--------+ ICA Distal81      24

## 2021-08-20 NOTE — Assessment & Plan Note (Signed)
In pain all the time ?Try TENS unit  - Zynex ? ? ?

## 2021-08-20 NOTE — Assessment & Plan Note (Signed)
Use Xyzal prn ?

## 2021-08-20 NOTE — Assessment & Plan Note (Signed)
Try Proair HFA ?Start Xyzal qd ?

## 2021-08-20 NOTE — Assessment & Plan Note (Signed)
On Meloxicam ?She has been taking meloxicam 15 mg daily for rheumatoid arthritis.  She cannot do without it due to stiffness, pain in the joints.  We will try to reduce meloxicam to 7.5 mg daily with an option to take 15 mg on a bad day.  We will continue with Protonix daily.  ?

## 2021-08-21 NOTE — Progress Notes (Signed)
LVM to RC 

## 2021-08-22 ENCOUNTER — Ambulatory Visit: Payer: Medicare Other | Admitting: Internal Medicine

## 2021-08-29 NOTE — Progress Notes (Signed)
?Cardiology Office Note:   ? ?Date:  08/30/2021  ? ?IDAjia [name], DOB 08-28-1950, MRN [id number] ? ?PCP:  Cassandria Anger, MD  ?Cardiologist:  Sinclair Grooms, MD  ? ?Referring MD: Cassandria Anger, MD  ? ?Chief Complaint  ?Patient presents with  ? Coronary Artery Disease  ? Hyperlipidemia  ? ? ?History of Present Illness:   ? ?Courtney [name] is a 71 y.o. female with a hx of  CAD, family history of CAD and HLD. ? ? ?He had splenic injury during colonoscopy.  Was pretty sick in the hospital in December with multiple hospital visits.  This was identified by CT.  She is recovered. ? ?No cardiac symptoms.  Tolerating twice weekly pravastatin. ? ?Past Medical History:  ?Diagnosis Date  ? ADD (attention deficit disorder)   ? Anxiety   ? Celiac disease   ? possible vs IBS  ? Complication of anesthesia   ? Coronary artery disease   ? Depression   ? no bipolar per Dr. [name]  ? GI problem   ? [name]  ? Gluten intolerance   ? Hyperlipidemia   ? IBS (irritable bowel syndrome)   ? Osteoarthritis   ? Osteoporosis   ? Pneumonia   ? PONV (postoperative nausea and vomiting)   ? Rheumatoid arthritis ([place])   ? Seasonal allergies 01/08/2012  ? hx. of multiple bronchitis related to this.  ? Shoulder pain, right   ? ? ?Past Surgical History:  ?Procedure Laterality Date  ? EXPLORATORY LAPAROTOMY  01/08/2012  ? due to endometriosis-portions of both ovaries removed  ? ROTATOR CUFF REPAIR Right   ? TONSILLECTOMY    ? TOTAL HIP ARTHROPLASTY Right   ? right. 2002  ? TOTAL HIP ARTHROPLASTY  01/14/2012  ? Procedure: TOTAL HIP ARTHROPLASTY;  Surgeon: Gearlean Alf, MD;  Location: [facility];  Service: Orthopedics;  Laterality: Left;  ? TOTAL KNEE ARTHROPLASTY Left 07/16/2020  ? Procedure: TOTAL KNEE ARTHROPLASTY;  Surgeon: Gaynelle Arabian, MD;  Location: [facility];  Service: Orthopedics;  Laterality: Left;  72mn  ? VENTRAL HERNIA REPAIR  01/08/2012  ? abdominal  ? ? ?Current Medications: ?Current Meds  ?Medication Sig  ?  albuterol (VENTOLIN HFA) 108 (90 Base) MCG/ACT inhaler Inhale 2 puffs into the lungs every 4 (four) hours as needed for wheezing or shortness of breath.  ? amphetamine-dextroamphetamine (ADDERALL) 5 MG tablet Take 2 tablets (10 mg total) by mouth 2 (two) times daily with a meal.  ? cyclobenzaprine (FLEXERIL) 10 MG tablet Take 10 mg by mouth as needed.  ? fluticasone (FLONASE) 50 MCG/ACT nasal spray Place 2 sprays into both nostrils daily.  ? imipramine (TOFRANIL) 50 MG tablet Take 3 tablets (150 mg total) by mouth at bedtime.  ? levocetirizine (XYZAL) 5 MG tablet Take 1 tablet (5 mg total) by mouth every evening.  ? lithium carbonate (LITHOBID) 300 MG CR tablet Take 1 tablet (300 mg total) by mouth at bedtime.  ? LORazepam (ATIVAN) 1 MG tablet Take 1 tablet (1 mg total) by mouth every 8 (eight) hours. (Patient taking differently: Take 1 mg by mouth as needed.)  ? meloxicam (MOBIC) 7.5 MG tablet Take 1-2 tablets (7.5-15 mg total) by mouth daily as needed for pain.  ? methocarbamol (ROBAXIN) 500 MG tablet Take 1 tablet (500 mg total) by mouth every 6 (six) hours as needed for muscle spasms.  ? omega-3 acid ethyl esters (LOVAZA) 1 g capsule Take 2 capsules (2 g total) by mouth 2 (  two) times daily.  ? ondansetron (ZOFRAN) 4 MG tablet Take 1 tablet (4 mg total) by mouth every 8 (eight) hours as needed for nausea or vomiting.  ? pravastatin (PRAVACHOL) 20 MG tablet Take one tablet by mouth 4 days per week  ? pravastatin (PRAVACHOL) 20 MG tablet Take 20 mg by mouth 2 (two) times a week.  ? Testosterone 1.62 % GEL Place 5 mg onto the skin daily.  ? traMADol (ULTRAM) 50 MG tablet TAKE 1/2 TO 1 TABLET(25 TO 50 MG) BY MOUTH BACK EVERY 8 HOURS AS NEEDED FOR SEVERE PAIN  ? vitamin E 400 UNIT capsule Take 800 Units by mouth daily.   ?  ? ?Allergies:   Clarithromycin, Covid-19 (mrna) vaccine, Other, Bactrim [sulfamethoxazole-trimethoprim], Morphine, Morphine and related, Azithromycin, Ceclor [cefaclor], Claritin [loratadine],  Codeine, Cymbalta [duloxetine hcl], Erythromycin, Erythromycin base, Levofloxacin, Lipitor [atorvastatin], Nitrofurantoin, Oxycodone, Penicillins, Propoxyphene, and Propoxyphene n-acetaminophen  ? ?Social History  ? ?Socioeconomic History  ? Marital status: Married  ?  Spouse name: Not on file  ? Number of children: 2  ? Years of education: Not on file  ? Highest education level: Not on file  ?Occupational History  ? Occupation: Futures trader  ?Tobacco Use  ? Smoking status: Never  ? Smokeless tobacco: Never  ? Tobacco comments:  ?  only social  ?Vaping Use  ? Vaping Use: Never used  ?Substance and Sexual Activity  ? Alcohol use: Yes  ?  Comment: 0-1 per day  ? Drug use: No  ? Sexual activity: Yes  ?Other Topics Concern  ? Not on file  ?Social History Narrative  ? Married for last 42 years.Lives with husband.Retired Insurance account manager.Originally from [place].  ? ?Social Determinants of Health  ? ?Financial Resource Strain: Low Risk   ? Difficulty of Paying Living Expenses: Not hard at all  ?Food Insecurity: No Food Insecurity  ? Worried About Charity fundraiser in the Last Year: Never true  ? Ran Out of Food in the Last Year: Never true  ?Transportation Needs: No Transportation Needs  ? Lack of Transportation (Medical): No  ? Lack of Transportation (Non-Medical): No  ?Physical Activity: Sufficiently Active  ? Days of Exercise per Week: 5 days  ? Minutes of Exercise per Session: 30 min  ?Stress: No Stress Concern Present  ? Feeling of Stress : Not at all  ?Social Connections: Socially Integrated  ? Frequency of Communication with Friends and Family: More than three times a week  ? Frequency of Social Gatherings with Friends and Family: More than three times a week  ? Attends Religious Services: 1 to 4 times per year  ? Active Member of Clubs or Organizations: Yes  ? Attends Archivist Meetings: 1 to 4 times per year  ? Marital Status: Married  ?  ? ?Family History: ?The patient's family  history includes ADD / ADHD in an other family member; Breast cancer in her paternal grandmother; Colon cancer in her paternal grandfather; Coronary artery disease in an other family member; Heart attack in her mother; Heart disease in her maternal grandmother, mother, and sister; Other in her daughter; Prostate cancer in her father. ? ?ROS:   ?Please see the history of present illness.    ?She complains of having blanching and cyanosis of her fingertips and toes.  I instructed her to speak to primary care concerning this.  Perhaps she needs a autoimmune work-up.  All other systems reviewed and are negative. ? ?EKGs/Labs/Other Studies Reviewed:   ? ?  The following studies were reviewed today: ? ?CAROTID DOPPLER STUDY 05/02/21: ?Summary:  ?Right Carotid: There is no evidence of stenosis in the right ICA. The  ?               extracranial vessels were near-normal with only minimal  ?wall  ?               thickening or plaque.  ? ?Left Carotid: There is no evidence of stenosis in the left ICA.  ?Hemodynamically  ?              significant plaque >50% visualized in the CCA. The  ?extracranial  ?              vessels were near-normal with only minimal wall thickening  ?or  ?              plaque.  ? ?Vertebrals:  Bilateral vertebral arteries demonstrate antegrade flow.  ?Subclavians: Normal flow hemodynamics were seen in bilateral subclavian  ?             arteries.  ? ?Lower extremity Doppler study: 07/14/2019 ?Summary:  ?Right: Resting right ankle-brachial index is within normal range. No  ?evidence of significant right lower extremity arterial disease. The right  ?toe-brachial index is normal.  ? ?Left: Resting left ankle-brachial index is within normal range. No  ?evidence of significant left lower extremity arterial disease. The left  ?toe-brachial index is normal.  ? ? ?EKG:  EKG  performed on 03/2021 demonstrated NSR with PRWP. ? ?Recent Labs: ?02/25/2021: ALT 12 ?04/17/2021: BUN 15; Creatinine, Ser 1.00; Hemoglobin  11.1; Platelets 318; Potassium 3.8; Sodium 130  ?Recent Lipid Panel ?   ?Component Value Date/Time  ? CHOL 174 05/01/2021 0812  ? CHOL 259 (H) 06/13/2015 1700  ? TRIG 72 05/01/2021 0812  ? TRIG 100 06/13/2015 17

## 2021-08-30 ENCOUNTER — Ambulatory Visit (INDEPENDENT_AMBULATORY_CARE_PROVIDER_SITE_OTHER): Payer: Medicare Other | Admitting: Interventional Cardiology

## 2021-08-30 ENCOUNTER — Encounter: Payer: Self-pay | Admitting: Interventional Cardiology

## 2021-08-30 VITALS — BP 118/62 | HR 86 | Ht 59.0 in | Wt 118.4 lb

## 2021-08-30 DIAGNOSIS — R0989 Other specified symptoms and signs involving the circulatory and respiratory systems: Secondary | ICD-10-CM | POA: Diagnosis not present

## 2021-08-30 DIAGNOSIS — Z87891 Personal history of nicotine dependence: Secondary | ICD-10-CM

## 2021-08-30 DIAGNOSIS — Z96652 Presence of left artificial knee joint: Secondary | ICD-10-CM | POA: Diagnosis not present

## 2021-08-30 DIAGNOSIS — E782 Mixed hyperlipidemia: Secondary | ICD-10-CM | POA: Diagnosis not present

## 2021-08-30 DIAGNOSIS — I739 Peripheral vascular disease, unspecified: Secondary | ICD-10-CM | POA: Diagnosis not present

## 2021-08-30 DIAGNOSIS — I2510 Atherosclerotic heart disease of native coronary artery without angina pectoris: Secondary | ICD-10-CM

## 2021-08-30 NOTE — Patient Instructions (Signed)

## 2021-09-03 ENCOUNTER — Ambulatory Visit: Payer: Medicare Other | Admitting: Psychiatry

## 2021-09-21 ENCOUNTER — Other Ambulatory Visit: Payer: Self-pay | Admitting: Psychiatry

## 2021-09-21 DIAGNOSIS — F411 Generalized anxiety disorder: Secondary | ICD-10-CM

## 2021-09-21 DIAGNOSIS — F331 Major depressive disorder, recurrent, moderate: Secondary | ICD-10-CM

## 2021-10-03 DIAGNOSIS — L814 Other melanin hyperpigmentation: Secondary | ICD-10-CM | POA: Diagnosis not present

## 2021-10-03 DIAGNOSIS — Z7189 Other specified counseling: Secondary | ICD-10-CM | POA: Diagnosis not present

## 2021-10-03 DIAGNOSIS — L570 Actinic keratosis: Secondary | ICD-10-CM | POA: Diagnosis not present

## 2021-10-03 DIAGNOSIS — D229 Melanocytic nevi, unspecified: Secondary | ICD-10-CM | POA: Diagnosis not present

## 2021-10-03 DIAGNOSIS — L700 Acne vulgaris: Secondary | ICD-10-CM | POA: Diagnosis not present

## 2021-10-07 ENCOUNTER — Ambulatory Visit (INDEPENDENT_AMBULATORY_CARE_PROVIDER_SITE_OTHER): Payer: Medicare Other | Admitting: Psychiatry

## 2021-10-07 ENCOUNTER — Encounter: Payer: Self-pay | Admitting: Psychiatry

## 2021-10-07 DIAGNOSIS — F331 Major depressive disorder, recurrent, moderate: Secondary | ICD-10-CM

## 2021-10-07 DIAGNOSIS — F902 Attention-deficit hyperactivity disorder, combined type: Secondary | ICD-10-CM | POA: Diagnosis not present

## 2021-10-07 DIAGNOSIS — F411 Generalized anxiety disorder: Secondary | ICD-10-CM | POA: Diagnosis not present

## 2021-10-07 DIAGNOSIS — I2510 Atherosclerotic heart disease of native coronary artery without angina pectoris: Secondary | ICD-10-CM

## 2021-10-07 NOTE — Progress Notes (Signed)
Courtney [name] [id number] 03-08-1951 71 y.o.  Subjective:   Patient ID:  Courtney [name] is a 71 y.o. (DOB 01-15-1951) female.  Chief Complaint:  Chief Complaint  Patient presents with   Follow-up   Anxiety   Depression    HPI Courtney [name] presents to the office today for follow-up of first visit 03/01/2020 referred by a friend [name].  Was prescribed Viibryd and Vyvanse was changed to Concerta 54 mg to try to reduce the amount of dry mouth.  05/02/2020 appointment with the following noted: Less dry mouth with Concerta but didn't seem to last beyond 3-4 PM. Viibryd 20 mg daily.  Hard to get enough calories at breakfast.  But has been taking both in the morning.  Some benefit with Viibryd. Is there something I can take besides a stimulant, bc fears it is stressing her body.  Dx college exhausted adrenal glands.  It helps.  Asked questions about alternatives to stimulants.  Reduced Concerta to reduce anxiety and see if can achieve, but she thinks it's worse in the evening. Anxiety is still a struggle.  Life crisis moment.  Chronic marital dissatisfaction worse now that both are retired.  Feels like she's in a prison.  Family notices she's stressed.  Has done therapy since age 61 yo.  Has done Hindu meditation without help.  Christian faith disciplines.  Grew up caretaking.  I'm done and I need to change.  Goal is calm down enough to function through the situation. Tingling foot and wants B12 testing. Plan: Switch Viibryd to 20 mg in evening meal to get better absorption.  06/18/2020 appointment with following noted: No difference in anxiety or mood with Viibryd 20 mg daily.  She doesn't want to increase it. Questions about Viibryd and Genesight testing.  Asks about differences between Adderall and MPH.   Missed for 3 days Concerta and took Adderall.    Tired by 4-5 PM and doesn't like that.    Plan: She wants to wean off the Viibryd because she does not feel it has been  helpful and she does not want to increase the dosage.  07/03/2020 phone call patient stating she wanted to continue Viibryd 20 mg daily.  She also requested refill of Concerta 54 mg  1/85/6314 appointment with the following noted: Was told she couldn't take Viibryd with one of the pain meds and had TKR.  So stopped it.  Couldn't tolerate pain meds except tramadol.  Had more pain than expected.   Stopped Viibryd about 07/15/20.  She had taken tramadol with Viibryd in the past without a problem.   Noticing more pain after surgery in non-surgical places. Seems to be sensitive to getting lightheaded and confused with low blood sugar.   Poor attention and scattered since surgery.  Also more tired. Occ taken tramadol and otherwise just Tylenol. Impossible to tell the effect of the Viibryd given she hasn't been herself for months. Concerned about Courtney [name]'s memory who is also a patient here.  Wants him to get neuropsych testing. Plan because patient is med sensitive we will start very low-dose fluvoxamine 25 mg nightly Per her request continue Concerta 54 mg every morning  10/25/2020 appointment with the following noted: Several phone calls since being here.  The first led to increasing fluvoxamine to 1-1/2 of the 25 mg tablets due to lack of effect at 3 weeks. 09/21/2020 she called asking to stop Concerta which she did. She called again wanting to start Adderall.  Because she had taken  it in 2019 it was agreed that she could start Adderall 10 mg twice daily. Stress dealing with husband and whether to move or not.  May separate.  Anxiety is very high.  Hard to calm down around him. $ stress.  Explodes with anger at husband.  Doesn't think he can sell the house on his own. Never been this stressed and anxious and in this kind of circumstance before. Never got the Adderall DT need for PA. Only caffeine in the AM Plan: Rec increase Luvox from 37.5 mg daily to 50 mg HS for a week then increase to 75 mg  daily and possibly higher.  B12 level checked 496 and normal. Hold Adderall until the anxiety is under control.   12/03/2020 phone call from patient: Patient called stating fluvoxamine made her more anxious and high strung and she stopped it.  She wanted an earlier appointment which was not available at the time she was given the option to see a nurse practitioner but refused.  12/19/2020 appointment with the following noted: Life very stressful right now and not getting better.  B in law died and marriage falling apart. Separating.   More than I can handle including anxiety and depression. Plan: Rec trial beta blocker propranolol 10-30 mg  twice daily for anxiety Consider TCA bc Genesight test  B12 level checked 496 and normal. Hold Adderall until the anxiety is under control.   01/01/2021 phone call from patient with nurse as follows: She is taking up to 30 mg propranolol twice daily without sufficient benefit for her anxiety.  01/03/2021 appointment with the following noted: Rare Adderall.  Propranolol didn't help anxiety much at 30 mg BID without SE.[name]   Thinks she's gotten depressed which is unusual.  In a perfect storm with divorce and moving and financial stress.  Not functioning well.  Hard to make a decision.  Poor productivity.   Having a lot of pain, chronically and worse lately. Starting Lyrica today. Taking alprazolam about 0.5 mg daily. Plan: Yes imipramine 25 and increase to 75 mg HS and then check blood level  02/26/21 appt noted: 01/21/2021 serum imipramine and desipramine total was 65 at 75 mg daily. It has made a difference.  But has made so many changes and moving.  Stress goes to GI system.   Was in ER last night with GI px and may have ulcer.  Unable to eat without nausea.  Lump in the throat nausea. Mild taking the edge off.  Can still explode. Can't remember the SE at 100 mg daily. GI dominating with pain and nausea. Sleep varies from good or bothered by sickness. Usually  fine but sometimes can't turn her brain off.  Moves in 5 days. Plan: Continue imipramine and increase to 100 mg again as soon as nausea managed. Add risperidone 1 mg HS off label for nausea and anxiety.  03/28/2021 appt noted: She is not taking the risperidone.  Tried it for a couple of weeks but did not help the nausea.  Didn't notice it helping anxiety but had tremor.  Off for 2 weeks and tremor better. Still on imipramine 75 mg nightly.  She did increase but wasn't sure it was causing the tremor as instructed. This week to GI and work up ordered and doubled med dose. Not handling the transition well.  Very irritable with husband.  Such a sense of urgency.   Not often with Xanax.   Not on Adderall.   Plan: imipramine and increase to 100  mg nightly.  B12 level checked 496 and normal. Hold Adderall until the anxiety is under control.  Switch Xanax to Ativan 1 mg tid since she is not on Adderall.    05/02/2021 appt noted: Increased imipramine 100 mg HS. Tolerated the increase Spleen bleed. Yesterday decent energy compared to what she had.  Ativan made her sleepy but only took it once. Sleeping a lot DT spleen injury. Can't tell about depression bc of injury kept her in bed.  Has to move to feel normal. Plan: imipramine and increase to 150 mg nightly, based on prior level.  05/29/21 appt noted: SE dry mouth Increased imipramine 150 mg HS Seen improvement in mood.  Meeting irritating situations with less response.  Better self control.  Less fear and anxiety. Ativan 1 mg prn makes her too sleepy and tired. Occ taken 1/2 Adderall with some energy and motivation.   GI work up ongoing with gastric emptying yesterday.  It's delayed ongoing.  GI doctor has commented on her hostility.   Sleeping really well.   Worst dep and anxiety was when moving and now 50% better.  Need to have some goals and since moving into townhouse and unknown what is next.  May move out of town to be near the kids at part  of the year but it's too cold.  Big decisions are hard. Difficulty with back pain so hasn't unpacked.   More hopeful about her health. Seasonal depression Plan: Imipramine is helpful but increase could be more helpful based on level.  Disc pros and cons on increase.  She wants to increase it..  Consider lithium augmentation. Increase imipramine to 200 mg nightly, based on prior level Disc light therapy in detail and gave handout.    Adderall prn. Reduce Ativan 1/2 mg tid prn since she is tired from it and not on Adderall usually.    07/05/2021 phone call complaining of dry mouth with imipramine.  Frustrating because of reportedly low sodium and was told to drink less water. MD response:We increased the imipramine from 3 of the 50 mg tablets to 4 of the 50 mg tablets on February 1.  If she has seen additional improvement in her mood it is to her advantage to continue the current dosage.  If she has not seen additional improvement since increasing the dose she can drop the imipramine back to 3 tablets daily. In addition to that she should discuss the dry mouth problem with her pharmacist as the pharmacist may have some other recommendations to help manage it.  But the usual recommendation is to switch her toothpaste to Biotene toothpaste and use the Biotene mouthwash and Biotene spray to manage the dry mouth as best she can. Patient response:Notified patient. She said the issue is more complex than just dry mouth. She has been having GI issues and has been very sick, had a splenic bleed, is unable to get out of bed at times. Dr[facility] Derrek Monaco at [facility] is her GI doctor. Patient spoke to her after Tressia Miners talked to her today and is wanting her to stop the imipramine to eliminate that as a source of her issues.   MD response:Okay she can come off the imipramine better mood anxiety and irritability will get worse off of it because it helped her.  However we cannot start any new medicines until we see if her  physical symptoms are any different off the medicine.  She can reduce imipramine by 1 tablet every 3 to 4 days.  The dry mouth will not resolve until she is off the medicine.  The splenic bleed is not related to imipramine unless she has some bizarre sort of allergy.  08/01/21 appt noted: Martin Majestic totally off imipramine for a week and GI px not better and restarted imipramine to 100 mg HS DT chronic constipation and then this week increased to 150 just this week.   Therapy with Mateo Flow helping a lot. Overall depression and anxiety is better. Trying to find endo.  Medically cleared from splenic bleed but doesn't think it is fine. Episode of abd paine with result in bed 5 days but back out of bed and functioning again. Hasn't needed lorazepam lately. Recognizes still angry and intense too much .   Plan check level and add lithium 300  10/07/21 appt noted; Taboo about lithium and didn't take it. Irritability is still focused and directed at H and not sure how it would be wihtout him.  Trying to address this with therapy and faith and prayer.   Seeing Godfrey Pick.   Everything fuels my fire.  Erupting this is never gonna change rage.  Recognizes this part is her problem.  Gets upset with him not doing things she wants done. Body getting stronger since Xmas and more she can do the better her depression.   Still some stiffness with pain in spine and leg in AM and then gets better. Not seriously depressed if can move. Don't handle inactivity well.  Able to do more now though has helped. Can't control her reaction to Merry Proud though knows it is over the top since she retired. Intermittent nausea with activity causes her to go to bed.  Waves of pain with it.  Frustrated cause is unknown.  Past Psychiatric Medication Trials: Trintellix NR, sertraline?,  Viibryd 20, remote zoloft, Paxil, Cymbalta couple days ? Adverse reaction Imipramine 150 better Fluvoxamine SE anxiety but ? Adequacy of trial bc  stress Fluvoxamine 100 in 2015 per Dr. [name] Gabapentin 1600 NR Lyrica  Risperidone 1 tremor Vyvanse, Adderall, concerta DC propranolol 10-30 mg  twice daily for anxiety bc not helpful History Genesight History of Dr. [name] and Pauline Good  Review of Systems:  Review of Systems  Cardiovascular:  Negative for chest pain and palpitations.  Gastrointestinal:  Positive for abdominal pain and nausea.  Musculoskeletal:  Positive for back pain.  Neurological:  Positive for dizziness. Negative for tremors.  Psychiatric/Behavioral:  Positive for dysphoric mood. Negative for agitation, confusion, decreased concentration and hallucinations. The patient is nervous/anxious.     Medications: I have reviewed the patient's current medications.  Current Outpatient Medications  Medication Sig Dispense Refill   albuterol (VENTOLIN HFA) 108 (90 Base) MCG/ACT inhaler Inhale 2 puffs into the lungs every 4 (four) hours as needed for wheezing or shortness of breath. 18 g 3   amphetamine-dextroamphetamine (ADDERALL) 5 MG tablet Take 2 tablets (10 mg total) by mouth 2 (two) times daily with a meal. 120 tablet 0   cyclobenzaprine (FLEXERIL) 10 MG tablet Take 10 mg by mouth as needed.     fluticasone (FLONASE) 50 MCG/ACT nasal spray Place 2 sprays into both nostrils daily. 16 g 1   imipramine (TOFRANIL) 50 MG tablet Take 3 tablets (150 mg total) by mouth at bedtime. 270 tablet 0   levocetirizine (XYZAL) 5 MG tablet Take 1 tablet (5 mg total) by mouth every evening. 30 tablet 3   LORazepam (ATIVAN) 1 MG tablet Take 1 tablet (1 mg total) by mouth every 8 (eight)  hours. (Patient taking differently: Take 1 mg by mouth as needed.) 90 tablet 1   meloxicam (MOBIC) 7.5 MG tablet Take 1-2 tablets (7.5-15 mg total) by mouth daily as needed for pain. 180 tablet 1   methocarbamol (ROBAXIN) 500 MG tablet Take 1 tablet (500 mg total) by mouth every 6 (six) hours as needed for muscle spasms. 40 tablet 0   omega-3 acid  ethyl esters (LOVAZA) 1 g capsule Take 2 capsules (2 g total) by mouth 2 (two) times daily. 120 capsule 11   ondansetron (ZOFRAN) 4 MG tablet Take 1 tablet (4 mg total) by mouth every 8 (eight) hours as needed for nausea or vomiting. 40 tablet 1   pravastatin (PRAVACHOL) 20 MG tablet Take one tablet by mouth 4 days per week 48 tablet 3   Testosterone 1.62 % GEL Place 5 mg onto the skin daily. 30 g 3   traMADol (ULTRAM) 50 MG tablet TAKE 1/2 TO 1 TABLET(25 TO 50 MG) BY MOUTH BACK EVERY 8 HOURS AS NEEDED FOR SEVERE PAIN 90 tablet 0   vitamin E 400 UNIT capsule Take 800 Units by mouth daily.      lithium carbonate (LITHOBID) 300 MG CR tablet Take 1 tablet (300 mg total) by mouth at bedtime. (Patient not taking: Reported on 10/07/2021) 90 tablet 0   nitroGLYCERIN (NITROSTAT) 0.4 MG SL tablet Place 1 tablet (0.4 mg total) under the tongue every 5 (five) minutes as needed for chest pain. (Patient not taking: Reported on 08/13/2021) 25 tablet 2   No current facility-administered medications for this visit.    Medication Side Effects: Other: dry mouth  Allergies:  Allergies  Allergen Reactions   Clarithromycin Nausea Only   Covid-19 (Mrna) Vaccine Other (See Comments)    Acute Vasculitis; excessive bleeding  Other reaction(s): Other (See Comments) Acute Vasculitis; excessive bleeding    Other Other (See Comments)   Bactrim [Sulfamethoxazole-Trimethoprim] Nausea And Vomiting   Morphine Nausea And Vomiting    Other reaction(s): Nausea/Vomiting   Morphine And Related Nausea And Vomiting   Azithromycin Nausea And Vomiting and Nausea Only   Ceclor [Cefaclor] Nausea And Vomiting    Can take Augmentin ok   Claritin [Loratadine]     REACTION: bruises   Codeine Nausea And Vomiting   Cymbalta [Duloxetine Hcl]    Erythromycin Nausea And Vomiting    Other reaction(s): Nausea/Vomiting   Erythromycin Base Nausea And Vomiting   Levofloxacin Nausea Only    REACTION: nausea   Lipitor [Atorvastatin]      Other reaction(s): Myalgias (intolerance)   Nitrofurantoin Nausea Only   Oxycodone Other (See Comments)   Penicillins Nausea And Vomiting    Can take Augmentin   Propoxyphene Nausea Only    dizzy   Propoxyphene N-Acetaminophen Nausea Only    dizzy    Past Medical History:  Diagnosis Date   ADD (attention deficit disorder)    Anxiety    Celiac disease    possible vs IBS   Complication of anesthesia    Coronary artery disease    Depression    no bipolar per Dr. [name]   GI problem    Olevia Perches   Gluten intolerance    Hyperlipidemia    IBS (irritable bowel syndrome)    Osteoarthritis    Osteoporosis    Pneumonia    PONV (postoperative nausea and vomiting)    Rheumatoid arthritis (HCC)    Seasonal allergies 01/08/2012   hx. of multiple bronchitis related to this.   Shoulder pain,  right     Family History  Problem Relation Age of Onset   Heart disease Mother    Heart attack Mother    Prostate cancer Father    Heart disease Sister    Heart disease Maternal Grandmother    Breast cancer Paternal Grandmother    Colon cancer Paternal Grandfather    Coronary artery disease Other        FH Female 1st degree relative <60   ADD / ADHD Other    Other Daughter        gluten intolerance    Social History   Socioeconomic History   Marital status: Married    Spouse name: Not on file   Number of children: 2   Years of education: Not on file   Highest education level: Not on file  Occupational History   Occupation: Futures trader  Tobacco Use   Smoking status: Never   Smokeless tobacco: Never   Tobacco comments:    only Art gallery manager Use: Never used  Substance and Sexual Activity   Alcohol use: Yes    Comment: 0-1 per day   Drug use: No   Sexual activity: Yes  Other Topics Concern   Not on file  Social History Narrative   Married for last 42 years.Lives with husband.Retired Insurance account manager.Originally from [place].   Social  Determinants of Health   Financial Resource Strain: Low Risk  (01/23/2021)   Overall Financial Resource Strain (CARDIA)    Difficulty of Paying Living Expenses: Not hard at all  Food Insecurity: No Food Insecurity (01/23/2021)   Hunger Vital Sign    Worried About Running Out of Food in the Last Year: Never true    Ran Out of Food in the Last Year: Never true  Transportation Needs: No Transportation Needs (01/23/2021)   PRAPARE - Hydrologist (Medical): No    Lack of Transportation (Non-Medical): No  Physical Activity: Sufficiently Active (01/23/2021)   Exercise Vital Sign    Days of Exercise per Week: 5 days    Minutes of Exercise per Session: 30 min  Stress: No Stress Concern Present (01/23/2021)   [place]    Feeling of Stress : Not at all  Social Connections: [place] (01/23/2021)   Social Connection and Isolation Panel [NHANES]    Frequency of Communication with Friends and Family: More than three times a week    Frequency of Social Gatherings with Friends and Family: More than three times a week    Attends Religious Services: 1 to 4 times per year    Active Member of [employer] or Organizations: Yes    Attends Archivist Meetings: 1 to 4 times per year    Marital Status: Married  Human resources officer Violence: Not At Risk (01/23/2021)   Humiliation, Afraid, Rape, and Kick questionnaire    Fear of Current or Ex-Partner: No    Emotionally Abused: No    Physically Abused: No    Sexually Abused: No    Past Medical History, Surgical history, Social history, and Family history were reviewed and updated as appropriate.   Please see review of systems for further details on the patient's review from today.   Objective:   Physical Exam:  LMP 01/08/1999   Physical Exam Constitutional:      General: She is not in acute distress. Musculoskeletal:        General: No  deformity.   Neurological:     Mental Status: She is alert and oriented to person, place, and time.     Coordination: Coordination normal.  Psychiatric:        Attention and Perception: Attention and perception normal. She does not perceive auditory or visual hallucinations.        Mood and Affect: Mood is anxious. Mood is not depressed. Affect is not labile, blunt, angry or inappropriate.        Speech: Speech normal. Speech is not slurred.        Behavior: Behavior is not hyperactive.        Thought Content: Thought content normal. Thought content is not paranoid or delusional. Thought content does not include homicidal or suicidal ideation. Thought content does not include suicidal plan.        Cognition and Memory: Cognition and memory normal.        Judgment: Judgment normal.     Comments: Insight intact Hyperactive style less intense and less depressed Stressed.  Irritability. Not pressured. Better with imipramine     Lab Review:     Component Value Date/Time   NA 130 (L) 04/17/2021 1825   NA 134 06/14/2018 1444   K 3.8 04/17/2021 1825   CL 97 (L) 04/17/2021 1825   CO2 26 04/17/2021 1825   GLUCOSE 92 04/17/2021 1825   BUN 15 04/17/2021 1825   BUN 10 06/14/2018 1444   CREATININE 1.00 04/17/2021 1825   CREATININE 0.76 04/10/2020 1431   CALCIUM 9.0 04/17/2021 1825   PROT 6.8 02/25/2021 1614   PROT 6.5 12/19/2020 0754   ALBUMIN 4.1 02/25/2021 1614   ALBUMIN 4.5 12/19/2020 0754   AST 16 02/25/2021 1614   ALT 12 02/25/2021 1614   ALKPHOS 47 02/25/2021 1614   BILITOT 0.3 02/25/2021 1614   BILITOT 0.4 12/19/2020 0754   GFRNONAA 60 (L) 04/17/2021 1825   GFRNONAA 79 04/10/2020 1431   GFRAA 91 04/10/2020 1431       Component Value Date/Time   WBC 5.0 04/17/2021 1825   RBC 3.53 (L) 04/17/2021 1825   HGB 11.1 (L) 04/17/2021 1825   HCT 33.4 (L) 04/17/2021 1825   PLT 318 04/17/2021 1825   MCV 94.6 04/17/2021 1825   MCH 31.4 04/17/2021 1825   MCHC 33.2 04/17/2021 1825   RDW 12.5  04/17/2021 1825   LYMPHSABS 2.0 06/14/2018 0005   MONOABS 0.7 06/14/2018 0005   EOSABS 0.2 06/14/2018 0005   BASOSABS 0.0 06/14/2018 0005    No results found for: "POCLITH", "LITHIUM"   No results found for: "PHENYTOIN", "PHENOBARB", "VALPROATE", "CBMZ"   06/04/20 B12 normal at 496  01/21/2021 imipramine level 25, desipramine level 40 equals total 65 which is low on 75 mg daily. 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 They have one copy of the C allele and one copy of the G allele. This genotype suggests a normal response to certain ADHD medications.  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 This genotype suggests a lower risk of serious hypersensitivity reactions, including Stevens-Johnson syndrome (SJS), toxic epidermal necrolysis (TEN), maculopapular eruptions, and Drug Reaction with Eosinophilia and Systemic Symptoms when taking certain mood stabilizers.  HLA-B*1502Lower Risk Not Present This patient does not carry the HLA-B*1502 allele or a closely related *15  allele. Absence of HLA-B*1502 and the closely related *15 alleles suggests lower risk of serious dermatologic reactions including toxic epidermal necrolysis (TEN) and Stevens-Johnson syndrome (SJS) when taking certain mood stabilizers.  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 They carry two copies of the G allele. This genotype has been associated with an increased risk of adverse drug reactions with certain selective serotonin reuptake inhibitors.  SLC6A4Intermediate Response L/S This patient is heterozygous for the short/long  promoter polymorphism of the serotonin transporter gene. The short promoter allele is reported to decrease expression of the serotonin transporter compared to the homozygous long promoter allele. The patient may have a moderately decreased likelihood of response to certain selective serotonin reuptake inhibitors due to the presence of the short form of the gene.  Pharmacokinetic Genes PK CES1A1Extensive (Normal) Metabolizer GLY/GLY CES1A1 - Gly allele enzyme activity: Normal CES1A1 - Gly allele enzyme activity: Normal  This genotype is most consistent with the extensive (normal) metabolizer phenotype.  The patient is expected to have normal enzyme activity.  CYP1A2Extensive (Normal) Metabolizer *1/*1 This genotype is most consistent with the extensive (normal) metabolizer phenotype.  CYP2B6Poor Metabolizer *6/*6 CYP2B6*6 allele enzyme activity: Reduced CYP2B6*6 allele enzyme activity: Reduced  This genotype is most consistent with the poor metabolizer phenotype. This patient may have reduced enzyme activity as compared to individuals with the normal phenotype.  CYP2C19Extensive (Normal) Metabolizer *1/*1 CYP2C19*1 allele enzyme activity: Normal CYP2C19*1 allele enzyme activity: Normal  This genotype is most consistent with the extensive (normal) metabolizer phenotype.  CYP2C9Intermediate Metabolizer *1/*2 CYP2C9*1 allele enzyme activity: Normal CYP2C9*2 allele enzyme activity: Reduced  This genotype is most consistent with the intermediate metabolizer phenotype. This patient may have reduced enzyme activity as compared to individuals with the normal phenotype.  CYP2D6Extensive (Normal) Metabolizer *1/*41 CYP2D6*1 allele enzyme activity: Normal CYP2D6*41 allele enzyme activity: Reduced  This genotype is most consistent with the extensive (normal) metabolizer phenotype.  CYP3A4Poor Metabolizer *22/*22 CYP3A4*22 allele enzyme activity: Reduced CYP3A4*22 allele enzyme  activity: Reduced  This genotype is most consistent with the poor metabolizer phenotype. This patient may have reduced enzyme activity as compared to individuals with the normal phenotype.  UGT1A4Ultrarapid Metabolizer *1/*3 UGT1A4*1 allele enzyme activity: Normal UGT1A4*3 allele enzyme activity: Increased  This genotype is most consistent with the ultrarapid metabolizer phenotype. This patient may have increased enzyme activity as compared to individuals with the normal phenotype.  UGT2B15Intermediate Metabolizer *2/*2 UGT2B15*2 allele enzyme activity: Reduced UGT2B15*2 allele enzyme activity: Reduced  This genotype is most consistent with the intermediate metabolizer phenotype. This patient may have reduced enzyme activity as compared to individuals with the normal phenotype. .res Assessment: Plan:    Suhaylah was seen today for follow-up, anxiety and depression.  Diagnoses and all orders for this visit:  Major depressive disorder, recurrent episode, moderate (HCC)  Generalized anxiety disorder  Attention deficit hyperactivity disorder (ADHD), combined type   Greater than 50% of 30 min face to face time with patient was spent on counseling and coordination of care. We discussed  Genesight testing and her desire to take something for anxiety that is in the "Use as Directed" column.   Overall depression is OK when active.  Chose TCA bc Genesight test;  disc SE in detail incl cardiac, etc  lithium augmentation. Continue imipramine 150 mg HS for a week and level was WNL at 238.  Augment with lithium CR 300 mg HS for anger and mood Disc  fear and stigma around lithium.  She agrees   B12 level checked 496 and normal.  Disc light therapy in detail and gave handout.     Adderall prn. Says 5 mg tablets less effective than 1/2 of a 10 mg tablet.  Not taking now.  Reduce Ativan 1/2 mg tid prn since she is tired from it and not on Adderall usually.    Consider alternatives for  anger outbursts, impulsivity like lithium, VPA, atypical.  Constipation management 1.  Lots of water 2.  Powdered fiber supplement such as MiraLAX, Citrucel, etc. preferably with a meal 3.  2 stool softeners a day 4.  Milk of magnesia or magnesium tablets if needed  Supportive therapy around marital crisis and other stressors.  She needs some goals.  Needs to get out of the house chronically.  Started therapy with Godfrey Pick.  FU 6-8 weeks  Lynder Parents, MD, DFAPA   Please see After Visit Summary for patient specific instructions.  Future Appointments  Date Time Provider [place]  12/04/2021 11:00 AM Cottle, Billey Co., MD [facility] None    No orders of the defined types were placed in this encounter.     -------------------------------

## 2021-10-08 ENCOUNTER — Telehealth: Payer: Self-pay | Admitting: Psychiatry

## 2021-10-08 NOTE — Telephone Encounter (Signed)
Patient lvm requesting clarification on medication discussed on yesterday's office visit. Contact at # [phone].

## 2021-10-08 NOTE — Telephone Encounter (Signed)
Spoke with pt

## 2021-11-05 ENCOUNTER — Telehealth: Payer: Self-pay | Admitting: Psychiatry

## 2021-11-05 NOTE — Telephone Encounter (Signed)
Patient notified of recommendation and expressed understanding.

## 2021-11-05 NOTE — Telephone Encounter (Signed)
It is possible that imipramine can cause tremor.  Just to be clear it does not cause Parkinson's disease.  The only way to really evaluate this is to reduce the dose of imipramine from 3 tablets to 2 tablets nightly and see if the tremor gets better.  It could take 10 days or so to notice an improvement in the tremor.  Of course reducing the dose of the antidepressant does create a risk that she could become more depressed but if she is willing to take that risk in order to evaluate the tremor then she can choose to do so.  A simple reduction should be enough of a change to tell whether the tremor is caused by imipramine or not because it should be improved with a dose reduction.  Do not stop the imipramine completely.

## 2021-11-05 NOTE — Telephone Encounter (Signed)
Called patient and she said she believes the medication is imipramine, which causes hand shaking. She said it has been almost the whole time she has been on the medication. Her level in April was good and you said at that time she would likely achieve better benefit for remaining on that dose. She said she tried to ignore it as it was mild, and still considers it mild, but is more conscious of it. At first she thought the shaking was only in the morning, but now she said it is all the time, but more pronounced in the morning. She is able to drink a cup of coffee without spilling it and said if she were trying to hand me a piece of paper and her elbow was on the furniture you wouldn't notice the shake, but if she lifted her elbow off the furniture you could see it. She has seen her PCP and she said that it was hopeful it was a medication SE, otherwise would need to look at the possibility of Parkinson's.

## 2021-11-05 NOTE — Telephone Encounter (Signed)
Pt LVM reporting she may be having side effects. Asking for RTC @ [phone]

## 2021-11-17 ENCOUNTER — Other Ambulatory Visit: Payer: Self-pay | Admitting: Psychiatry

## 2021-11-17 DIAGNOSIS — F331 Major depressive disorder, recurrent, moderate: Secondary | ICD-10-CM

## 2021-11-17 DIAGNOSIS — F411 Generalized anxiety disorder: Secondary | ICD-10-CM

## 2021-11-18 DIAGNOSIS — Z79890 Hormone replacement therapy (postmenopausal): Secondary | ICD-10-CM | POA: Diagnosis not present

## 2021-11-18 DIAGNOSIS — Z79899 Other long term (current) drug therapy: Secondary | ICD-10-CM | POA: Diagnosis not present

## 2021-11-18 DIAGNOSIS — R6889 Other general symptoms and signs: Secondary | ICD-10-CM | POA: Diagnosis not present

## 2021-11-18 DIAGNOSIS — Z9229 Personal history of other drug therapy: Secondary | ICD-10-CM | POA: Diagnosis not present

## 2021-11-18 DIAGNOSIS — E871 Hypo-osmolality and hyponatremia: Secondary | ICD-10-CM | POA: Diagnosis not present

## 2021-11-18 DIAGNOSIS — E039 Hypothyroidism, unspecified: Secondary | ICD-10-CM | POA: Diagnosis not present

## 2021-11-18 DIAGNOSIS — R29890 Loss of height: Secondary | ICD-10-CM | POA: Diagnosis not present

## 2021-11-18 DIAGNOSIS — M8588 Other specified disorders of bone density and structure, other site: Secondary | ICD-10-CM | POA: Diagnosis not present

## 2021-11-27 IMAGING — CT CT ABD-PELV W/ CM
2 of 5 series · 16 of 46 positions shown, 18 images · IV contrast (APPLIED)
Comparison: Abdominal ultrasound dated 05/23/2015.

CLINICAL DATA: Abdominal pain.

EXAM:
CT ABDOMEN AND PELVIS WITH CONTRAST
TECHNIQUE: Multidetector CT imaging of the abdomen and pelvis was performed
using the standard protocol following bolus administration of
intravenous contrast.
CONTRAST:  75mL OMNIPAQUE IOHEXOL 300 MG/ML  SOLN

[Series 2: abd pel w · axial · 0.69mm/px · z∈[-402,-112]mm · 13 of 66 slices shown, 15 images]
[im 4/66  soft-tissue]
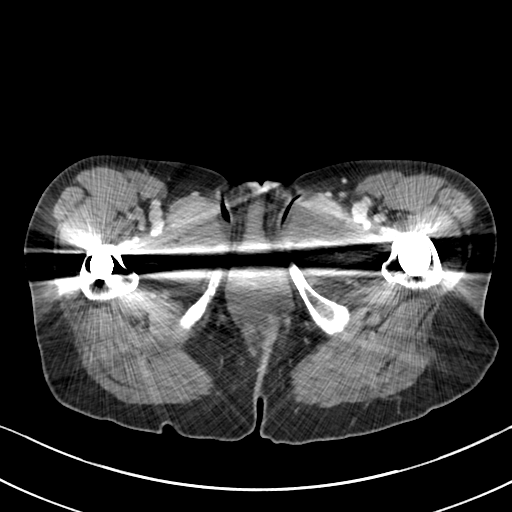
[im 4/66  bone]
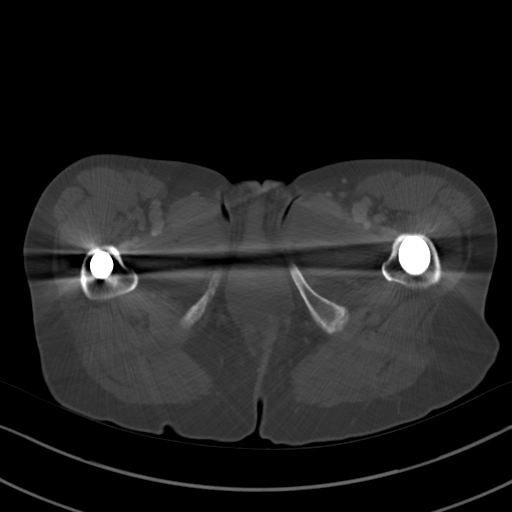
[im 8/66  soft-tissue]
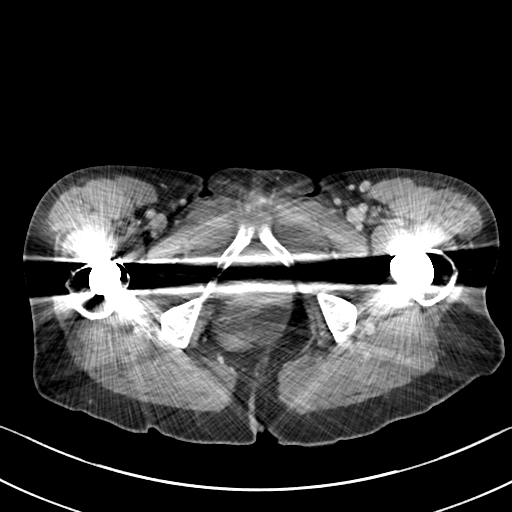
[im 15/66  soft-tissue]
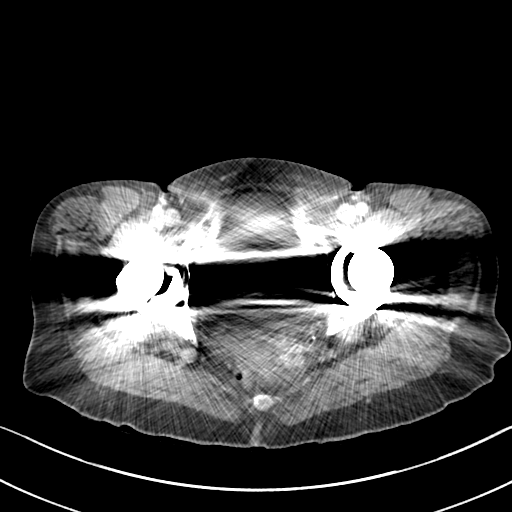
[im 19/66  soft-tissue]
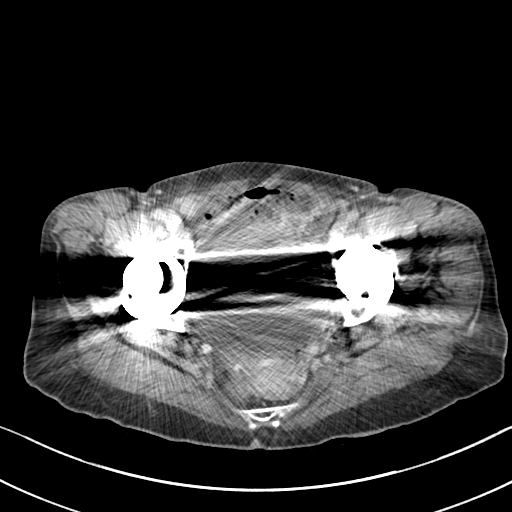
[im 22/66  soft-tissue]
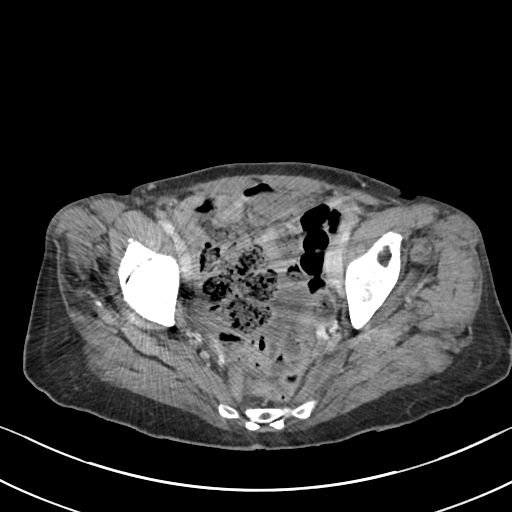
[im 29/66  soft-tissue]
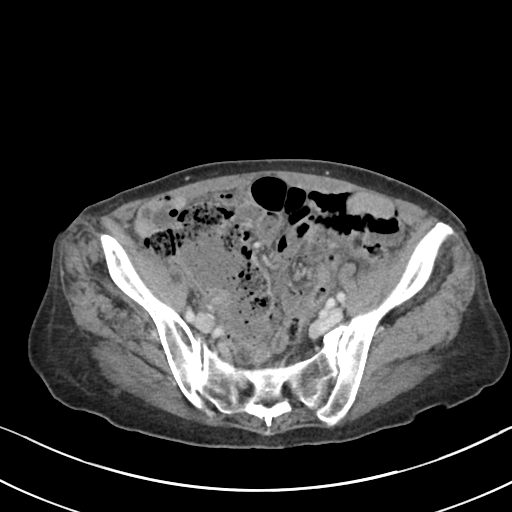
[im 33/66  soft-tissue]
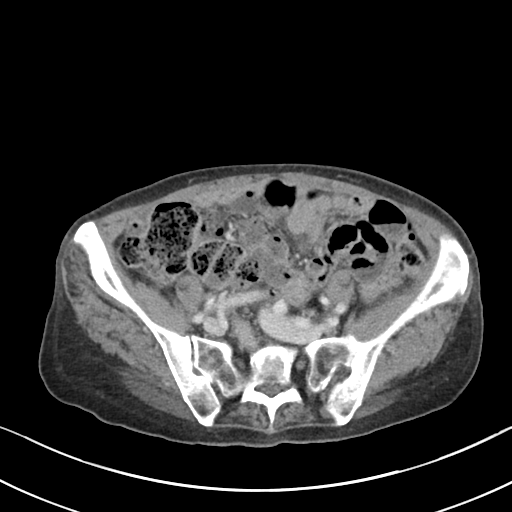
[im 37/66  soft-tissue]
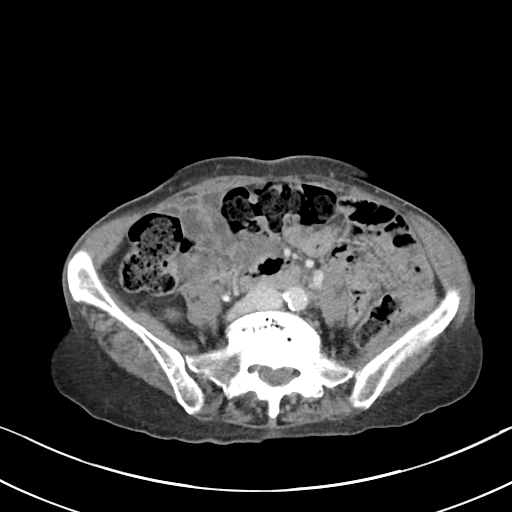
[im 44/66  soft-tissue]
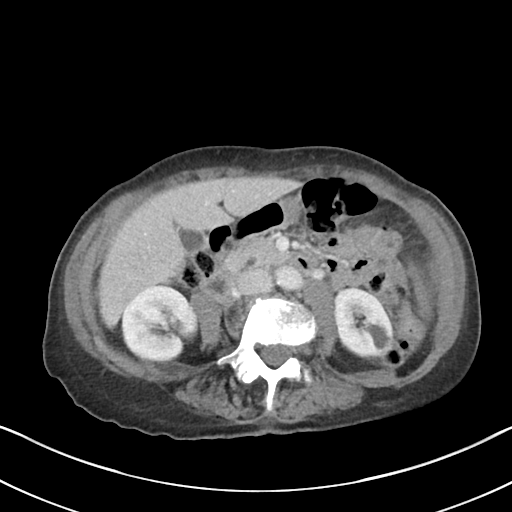
[im 44/66  bone]
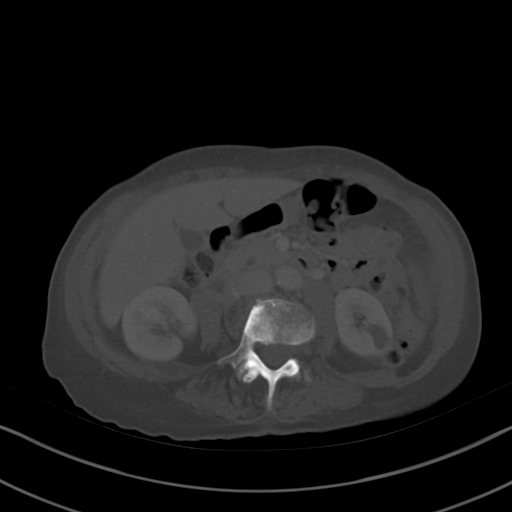
[im 47/66  soft-tissue]
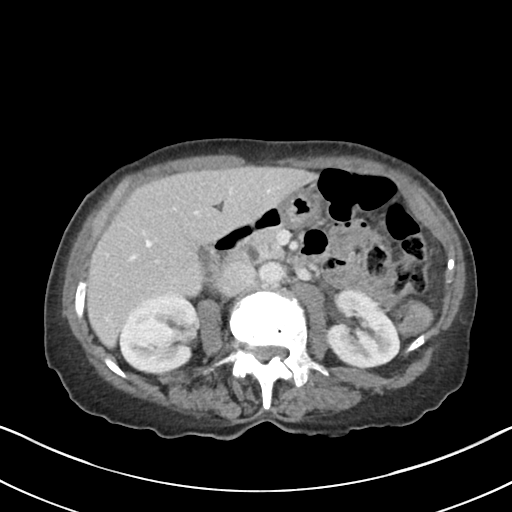
[im 51/66  soft-tissue]
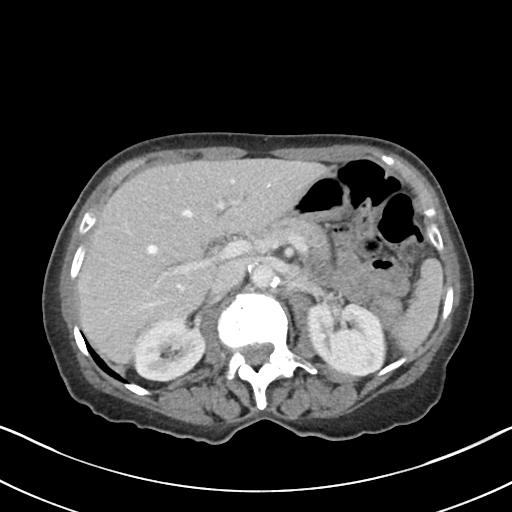
[im 58/66  soft-tissue]
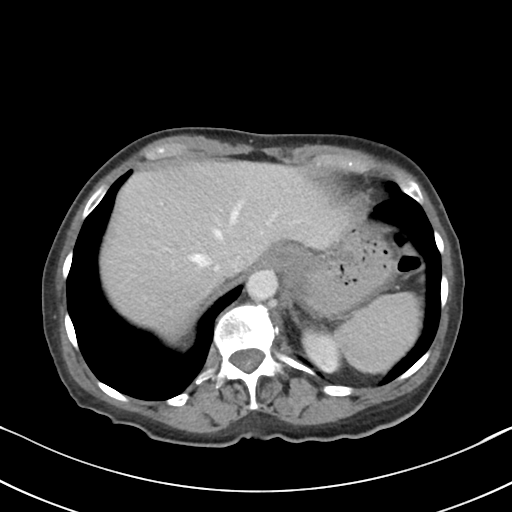
[im 62/66  soft-tissue]
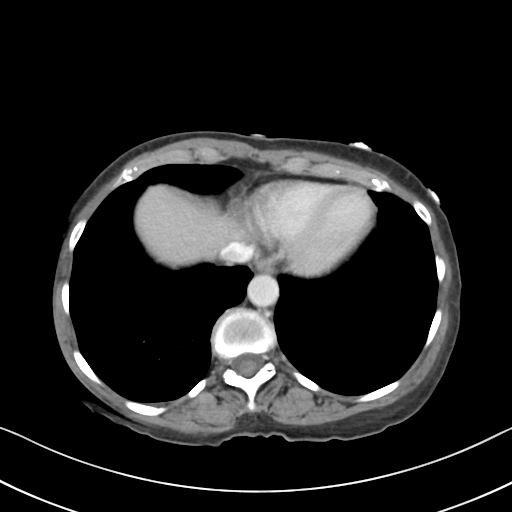

[Series 5: coronal · coronal · 0.66mm/px · 3 of 74 slices shown]
[im 25/74  soft-tissue]
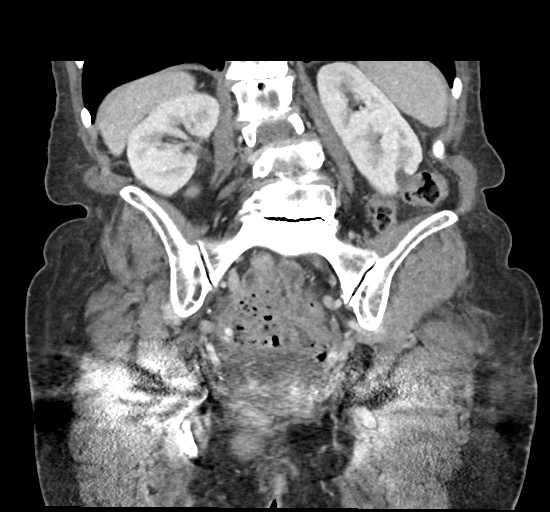
[im 33/74  soft-tissue]
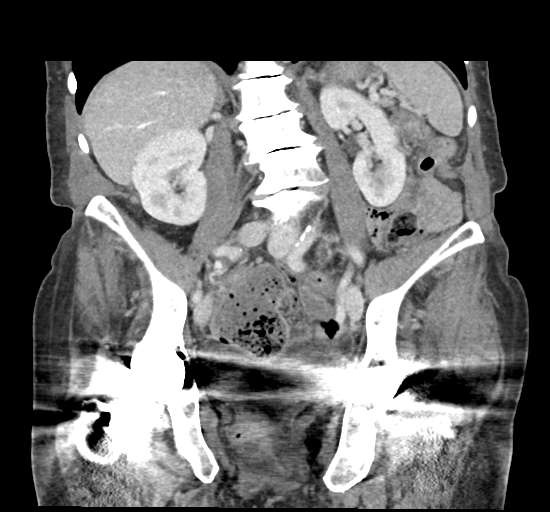
[im 41/74  soft-tissue]
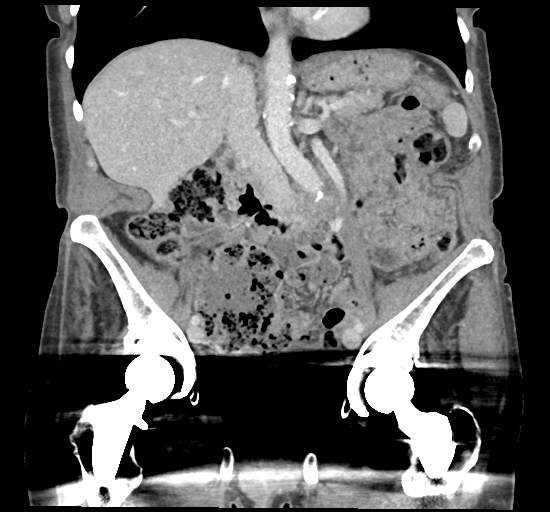

[16 of 46 positions shown; findings below may reference images not displayed]

FINDINGS: Lower chest: The visualized lung bases are clear. There is coronary
vascular calcification.

No intra-abdominal free air. Evaluation of the pelvis is limited due
to streak artifact caused by bilateral hip arthroplasties. A
somewhat loculated fluid in the posterior pelvis is not excluded
(47/2).

Hepatobiliary: The liver is unremarkable. No intrahepatic biliary
ductal dilatation. The gallbladder is unremarkable.

Pancreas: Unremarkable. No pancreatic ductal dilatation or
surrounding inflammatory changes.

Spleen: Normal in size without focal abnormality.

Adrenals/Urinary Tract: The adrenal glands are unremarkable. There
is no hydronephrosis on either side. There is symmetric enhancement
and excretion of contrast by both kidneys. There is a 15 mm left
renal inferior pole cyst. The urinary bladder is poorly visualized
due to streak artifact caused by bilateral hip arthroplasties.

Stomach/Bowel: Moderate stool throughout the colon. There is no
bowel obstruction. The appendix is normal.

Vascular/Lymphatic: Mild aortoiliac atherosclerotic disease. The IVC
is unremarkable. No portal venous gas. There is no adenopathy.

Reproductive: The uterus is retroverted and grossly unremarkable.

Other: None

Musculoskeletal: Severe degenerative changes of the spine. Lower
lumbar anterolisthesis. Bilateral hip arthroplasties. No acute
osseous pathology.
IMPRESSION: 1. No acute intra-abdominal or pelvic pathology.
2. Moderate colonic stool burden. No bowel obstruction. Normal
appendix.
3. Aortic Atherosclerosis ([id number]).

## 2021-11-28 ENCOUNTER — Ambulatory Visit: Payer: Medicare Other | Admitting: Psychiatry

## 2021-12-04 ENCOUNTER — Ambulatory Visit: Payer: Medicare Other | Admitting: Psychiatry

## 2021-12-04 ENCOUNTER — Telehealth: Payer: Self-pay | Admitting: Psychiatry

## 2021-12-04 DIAGNOSIS — E039 Hypothyroidism, unspecified: Secondary | ICD-10-CM | POA: Diagnosis not present

## 2021-12-04 NOTE — Telephone Encounter (Signed)
Patient called regarding Amphetamine medication. States that tremors have lessoned taking two pills but not completely gone. She is sleeping for long periods of time from about 9-11 hours a night. Which is not normal for her she can sleep for eleven hours and than take a nap for two hours. She would like to know if this has anything to do with medication. She also says that she thought it was due to a abdominal hemorrhage she experienced in January but it August now her sleep pattern is still off. Please rtc [phone]

## 2021-12-04 NOTE — Telephone Encounter (Signed)
Please review

## 2021-12-05 NOTE — Telephone Encounter (Signed)
LVM with info and to rtc with questions

## 2021-12-05 NOTE — Telephone Encounter (Signed)
Her history with medications is complicated and I do not want to do anything that will cause more problems than it helps.  So I would prefer to make any med changes at her next appointment.  However the Adderall is obviously a stimulant and will not affect her sleep at night.  I am not sure why she is needing to sleep more than normal.  But it will not be related to the Adderall.  We can explore other options at her appointment unless she has a more specific question.

## 2021-12-09 DIAGNOSIS — M0579 Rheumatoid arthritis with rheumatoid factor of multiple sites without organ or systems involvement: Secondary | ICD-10-CM | POA: Diagnosis not present

## 2021-12-09 DIAGNOSIS — Z6823 Body mass index (BMI) 23.0-23.9, adult: Secondary | ICD-10-CM | POA: Diagnosis not present

## 2021-12-09 DIAGNOSIS — M1991 Primary osteoarthritis, unspecified site: Secondary | ICD-10-CM | POA: Diagnosis not present

## 2021-12-09 DIAGNOSIS — Z79899 Other long term (current) drug therapy: Secondary | ICD-10-CM | POA: Diagnosis not present

## 2021-12-18 ENCOUNTER — Other Ambulatory Visit: Payer: Self-pay

## 2021-12-19 MED ORDER — AMPHETAMINE-DEXTROAMPHETAMINE 10 MG PO TABS: 10.0000 mg | ORAL_TABLET | Freq: Two times a day (BID) | ORAL | 0 refills | Status: DC

## 2022-01-07 ENCOUNTER — Telehealth: Payer: Self-pay

## 2022-01-07 NOTE — Telephone Encounter (Addendum)
Prior Authorization initiated Amphetamine-Dextroamphetamine '10MG'$  tablets #60 [employer]  Approved  Effective:  12/10/2021 to 01/09/2023 ID: [id number]

## 2022-01-13 ENCOUNTER — Other Ambulatory Visit: Payer: Self-pay | Admitting: Psychiatry

## 2022-01-13 DIAGNOSIS — F331 Major depressive disorder, recurrent, moderate: Secondary | ICD-10-CM

## 2022-01-13 DIAGNOSIS — F411 Generalized anxiety disorder: Secondary | ICD-10-CM

## 2022-01-13 NOTE — Telephone Encounter (Signed)
Next visit is 01/22/22. [place] called requesting a refill on lithium carbonate 300 mg CR. Pharmacy is:  [id number] - [place], [place] - [address]  Phone:  [phone]  Fax:  [phone]

## 2022-01-15 ENCOUNTER — Other Ambulatory Visit: Payer: Self-pay | Admitting: Psychiatry

## 2022-01-15 ENCOUNTER — Other Ambulatory Visit: Payer: Self-pay | Admitting: Internal Medicine

## 2022-01-15 DIAGNOSIS — F411 Generalized anxiety disorder: Secondary | ICD-10-CM

## 2022-01-15 DIAGNOSIS — F331 Major depressive disorder, recurrent, moderate: Secondary | ICD-10-CM

## 2022-01-15 IMAGING — CR DG CHEST 2V
2 series · 2 of 2 positions shown · non-contrast
Comparison: Chest radiograph dated June 14, 2018

CLINICAL DATA: Chest pain

EXAM:
CHEST - 2 VIEW

[w chest lat]
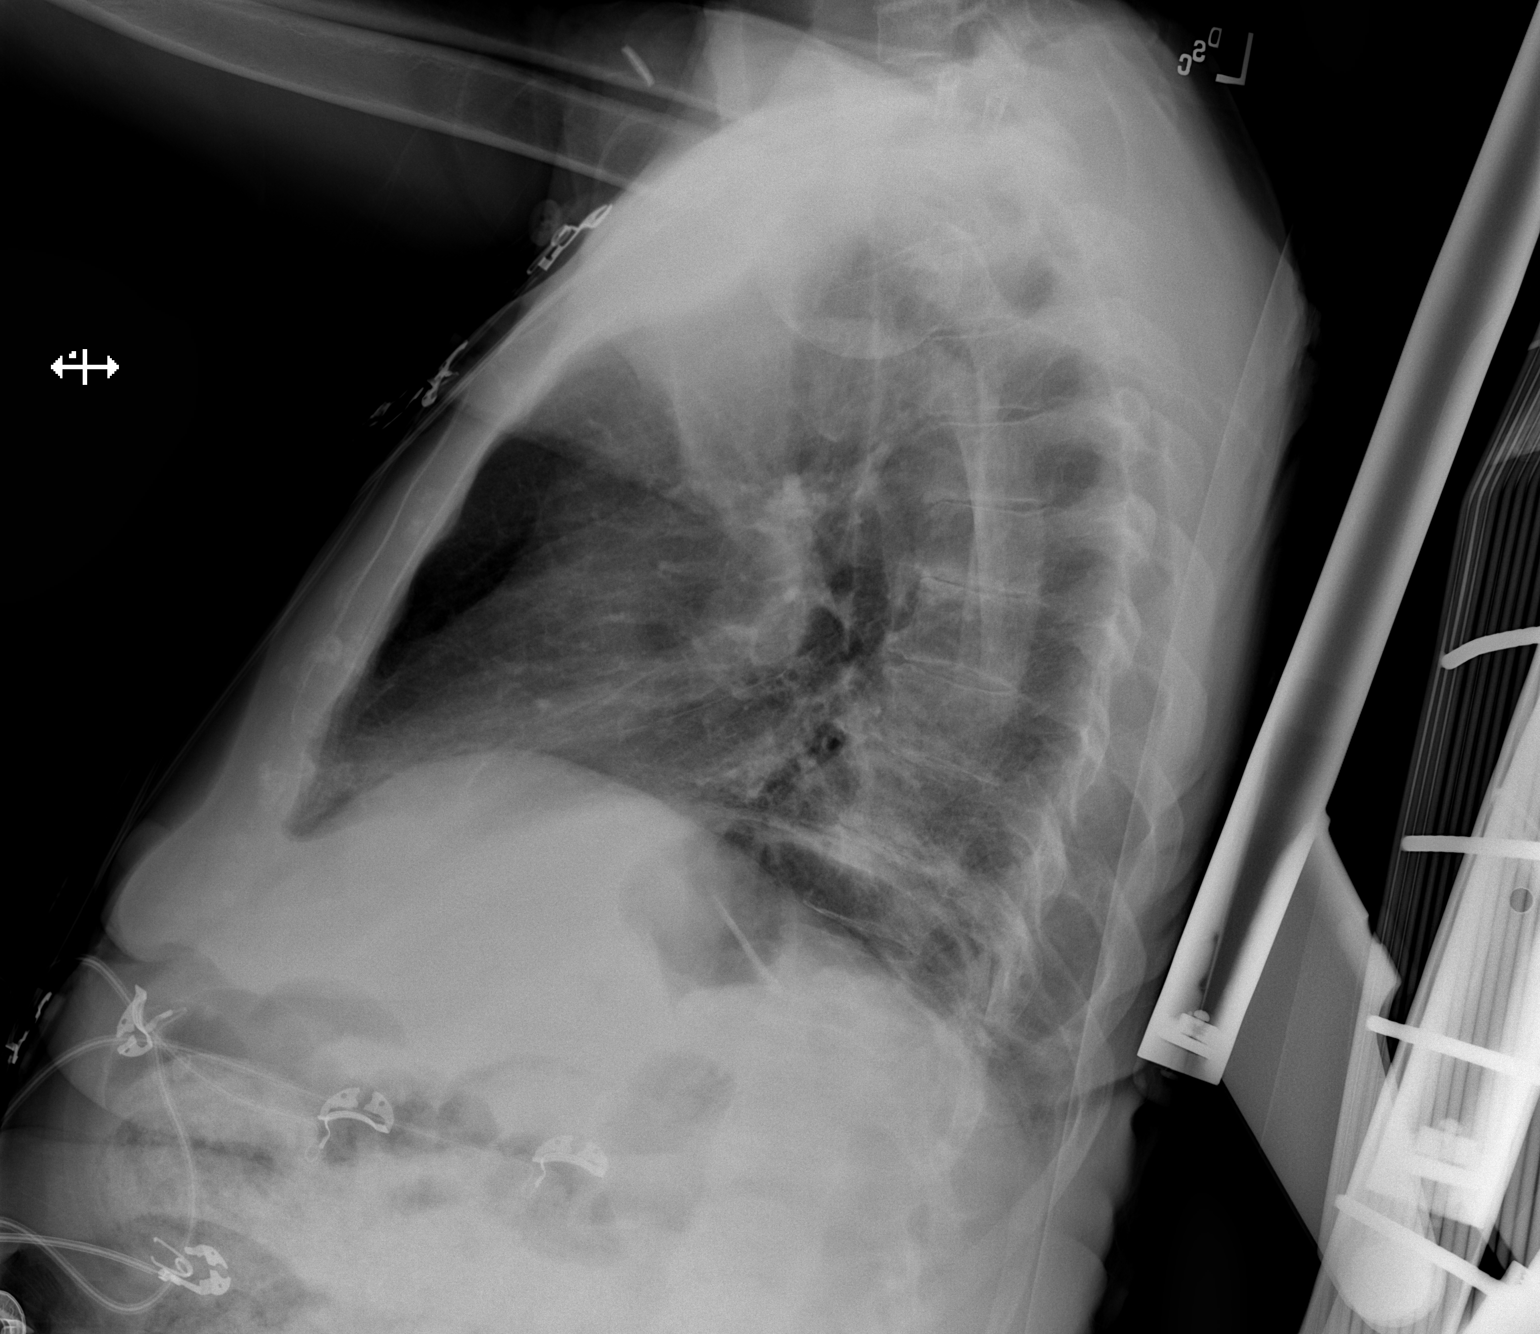

[x chest ap]
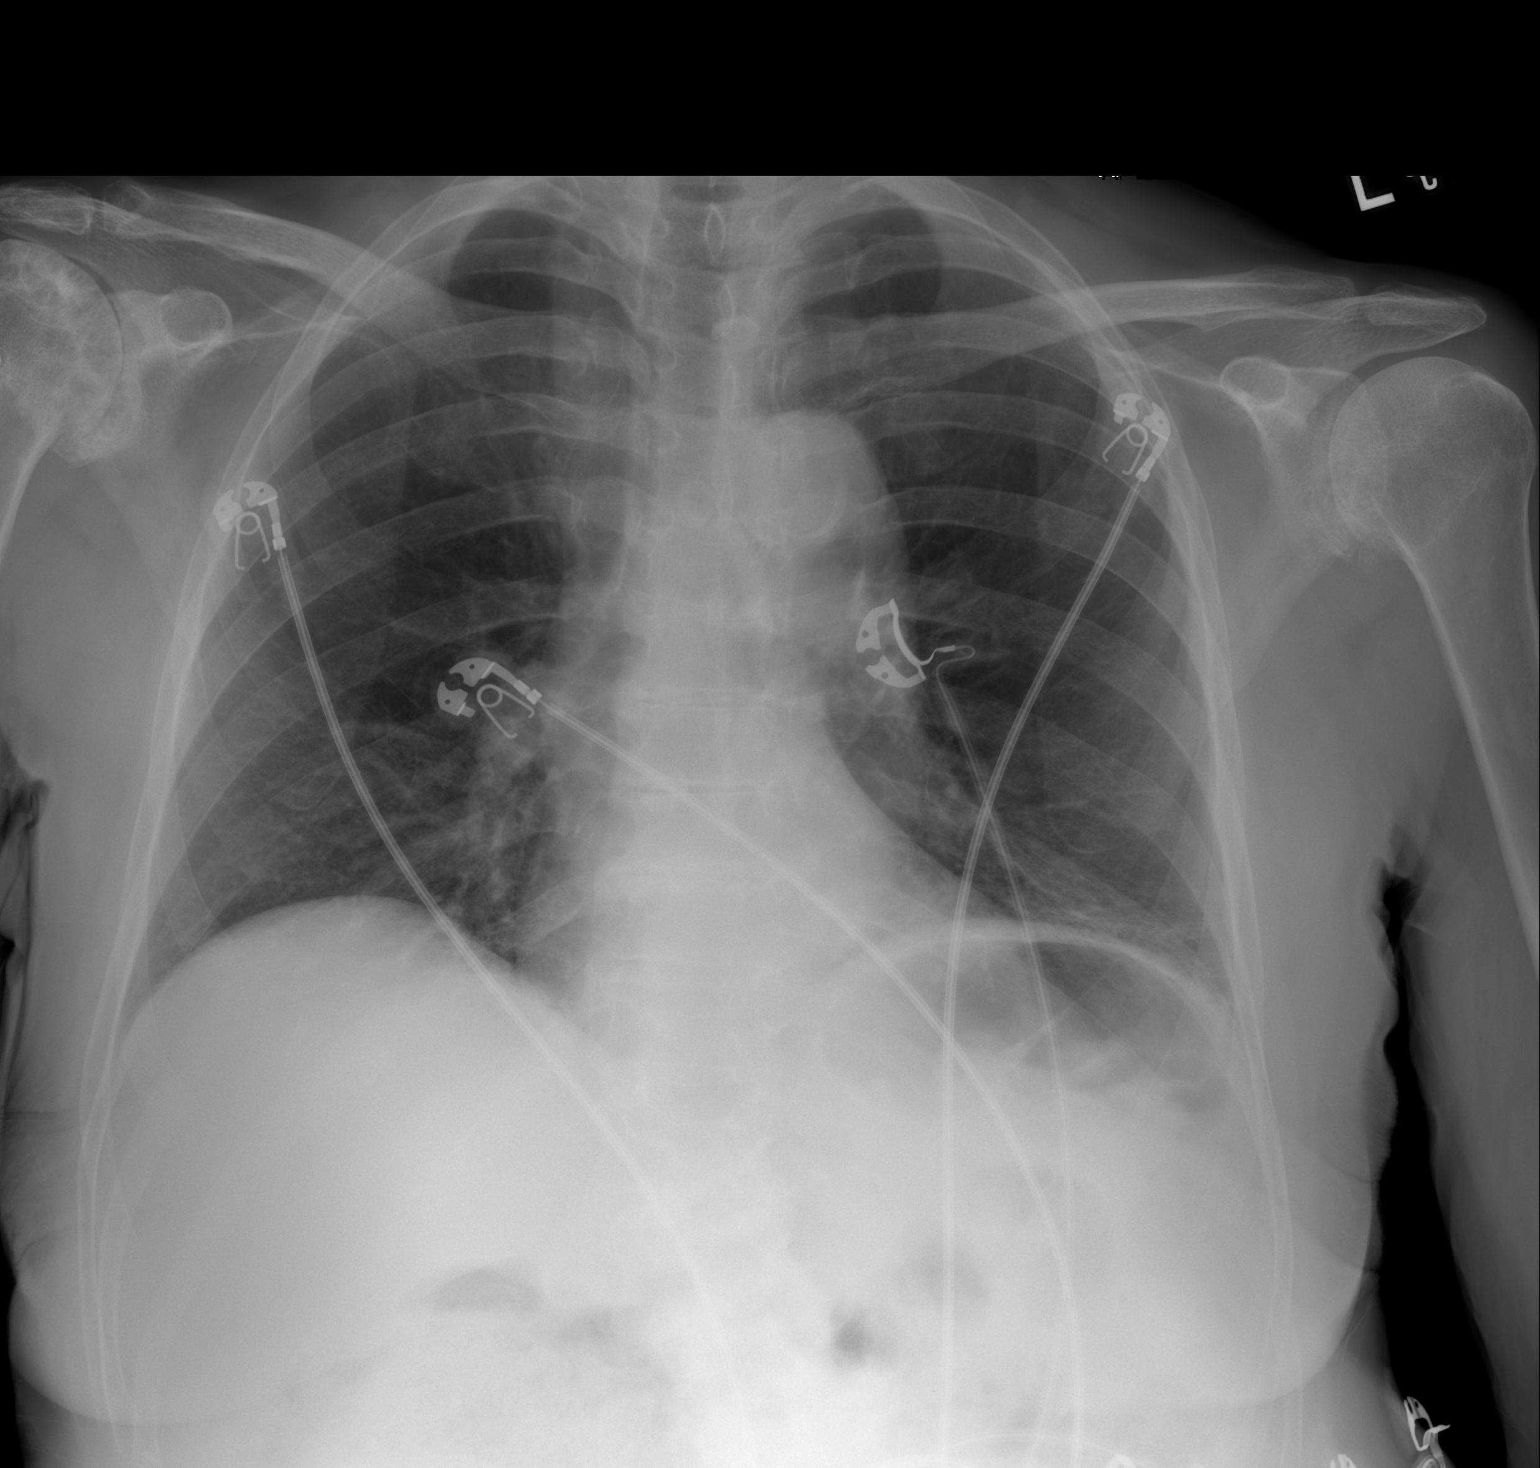

[2 of 2 positions shown; findings below may reference images not displayed]

FINDINGS: The heart size and mediastinal contours are within normal limits.
Atherosclerotic calcification of aortic arch. Both lungs are clear.
The visualized skeletal structures are unremarkable.
IMPRESSION: No active cardiopulmonary disease.

## 2022-01-17 IMAGING — DX DG ABDOMEN 2V
2 series · 2 of 2 positions shown · non-contrast
Comparison: None.

CLINICAL DATA: Chest and abdominal pain

EXAM:
ABDOMEN - 2 VIEW

[abdomen erect]
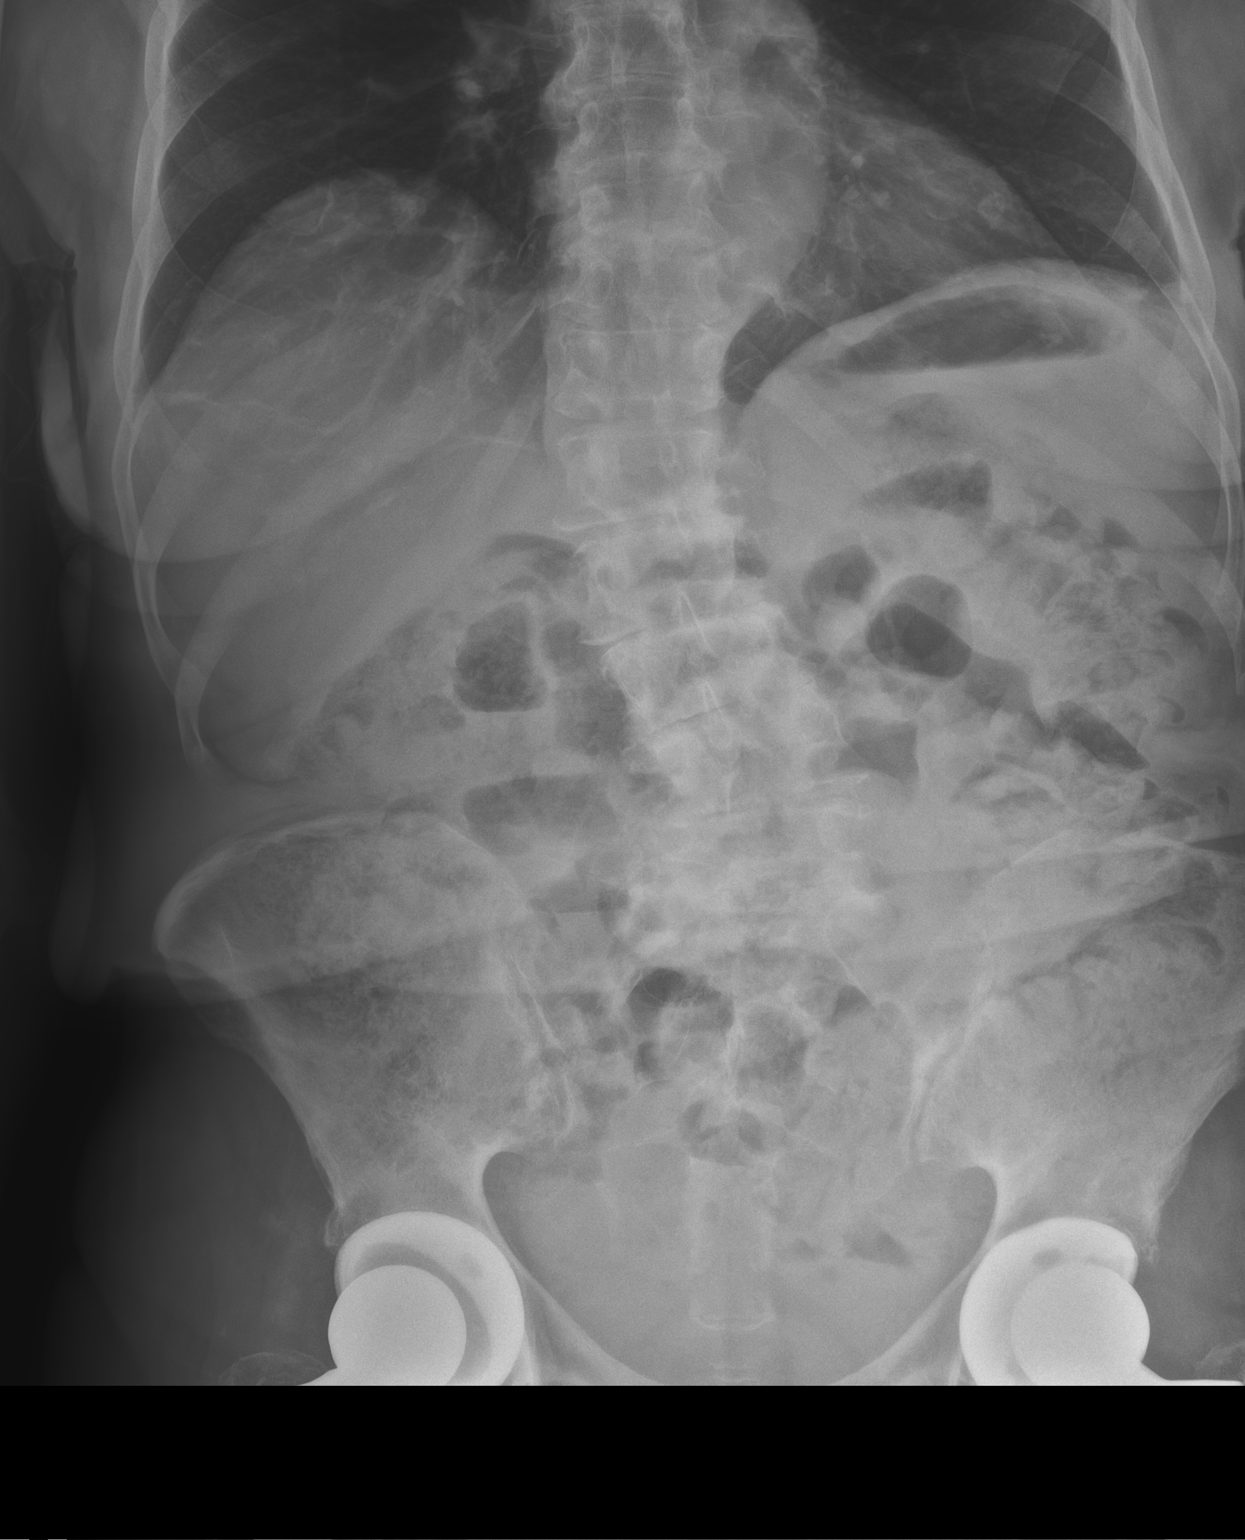

[abdomen supine]
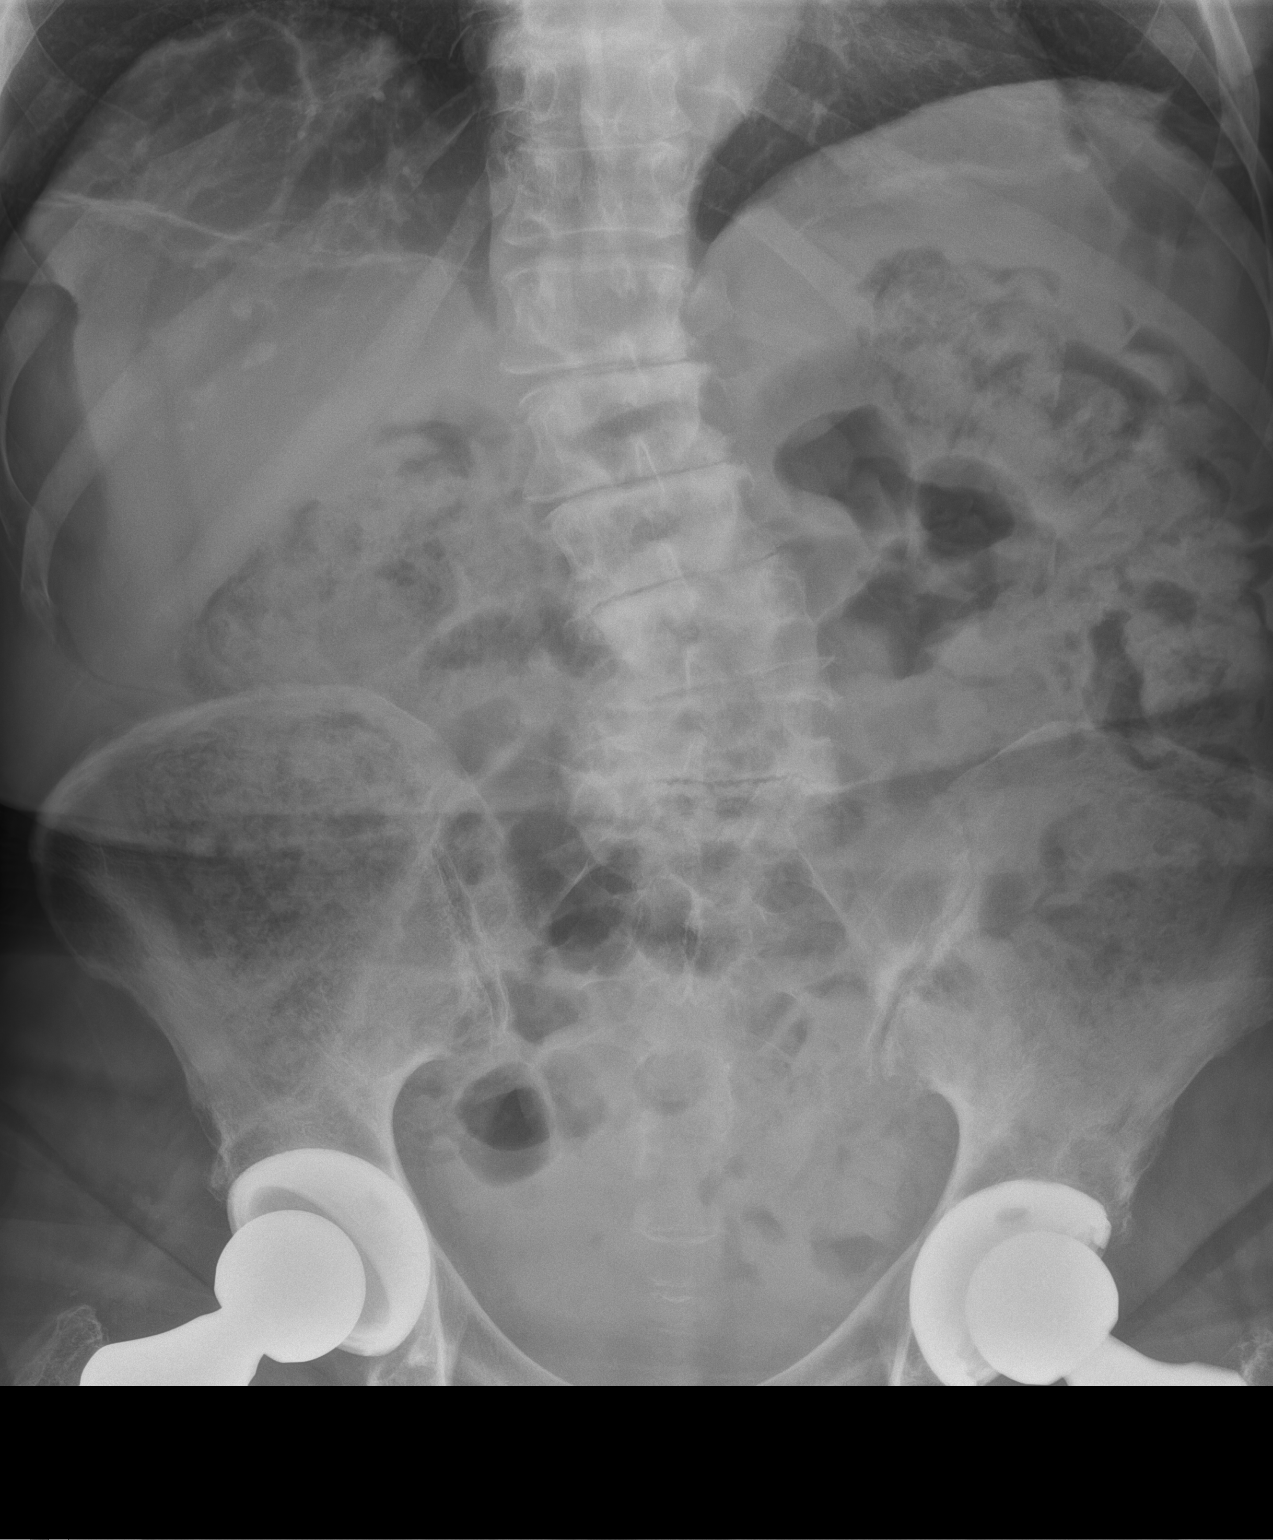

[2 of 2 positions shown; findings below may reference images not displayed]

FINDINGS: The bowel gas pattern is normal. Lung bases are clear. There is no
evidence of free air. No radio-opaque calculi or other significant
radiographic abnormality is seen. Bilateral hip arthroplasty with
intact hardware. Advanced degenerative disease of the lumbar spine
with levoscoliosis centered at L4 vertebral body
IMPRESSION: Moderate multilevel DA and disease of the lumbar spine.
Nonobstructive bowel gas pattern. Lung bases are clear.

## 2022-01-22 ENCOUNTER — Ambulatory Visit (INDEPENDENT_AMBULATORY_CARE_PROVIDER_SITE_OTHER): Payer: Medicare Other | Admitting: Psychiatry

## 2022-01-22 ENCOUNTER — Encounter: Payer: Self-pay | Admitting: Psychiatry

## 2022-01-22 VITALS — BP 130/78 | HR 91

## 2022-01-22 DIAGNOSIS — F902 Attention-deficit hyperactivity disorder, combined type: Secondary | ICD-10-CM

## 2022-01-22 DIAGNOSIS — D508 Other iron deficiency anemias: Secondary | ICD-10-CM

## 2022-01-22 DIAGNOSIS — F411 Generalized anxiety disorder: Secondary | ICD-10-CM | POA: Diagnosis not present

## 2022-01-22 DIAGNOSIS — F331 Major depressive disorder, recurrent, moderate: Secondary | ICD-10-CM | POA: Diagnosis not present

## 2022-01-22 DIAGNOSIS — I2510 Atherosclerotic heart disease of native coronary artery without angina pectoris: Secondary | ICD-10-CM | POA: Diagnosis not present

## 2022-01-22 MED ORDER — LITHIUM CARBONATE ER 300 MG PO TBCR: 300.0000 mg | EXTENDED_RELEASE_TABLET | Freq: Every evening | ORAL | 0 refills | Status: DC

## 2022-01-22 MED ORDER — AMPHETAMINE-DEXTROAMPHETAMINE 10 MG PO TABS: 10.0000 mg | ORAL_TABLET | Freq: Two times a day (BID) | ORAL | 0 refills | Status: DC

## 2022-01-22 NOTE — Progress Notes (Signed)
Courtney [name] [id number] Oct 31, 1950 71 y.o.  Subjective:   Patient ID:  Courtney [name] is a 71 y.o. (DOB 10-05-1950) female.  Chief Complaint:  Chief Complaint  Patient presents with   Follow-up   Depression   Anxiety   ADHD    HPI Courtney [name] presents to the office today for follow-up of first visit 03/01/2020 referred by a friend Courtney [name].  Was prescribed Viibryd and Vyvanse was changed to Concerta 54 mg to try to reduce the amount of dry mouth.  05/02/2020 appointment with the following noted: Less dry mouth with Concerta but didn't seem to last beyond 3-4 PM. Viibryd 20 mg daily.  Hard to get enough calories at breakfast.  But has been taking both in the morning.  Some benefit with Viibryd. Is there something I can take besides a stimulant, bc fears it is stressing her body.  Dx college exhausted adrenal glands.  It helps.  Asked questions about alternatives to stimulants.  Reduced Concerta to reduce anxiety and see if can achieve, but she thinks it's worse in the evening. Anxiety is still a struggle.  Life crisis moment.  Chronic marital dissatisfaction worse now that both are retired.  Feels like she's in a prison.  Family notices she's stressed.  Has done therapy since age 34 yo.  Has done Hindu meditation without help.  Christian faith disciplines.  Grew up caretaking.  I'm done and I need to change.  Goal is calm down enough to function through the situation. Tingling foot and wants B12 testing. Plan: Switch Viibryd to 20 mg in evening meal to get better absorption.  06/18/2020 appointment with following noted: No difference in anxiety or mood with Viibryd 20 mg daily.  She doesn't want to increase it. Questions about Viibryd and Genesight testing.  Asks about differences between Adderall and MPH.   Missed for 3 days Concerta and took Adderall.    Tired by 4-5 PM and doesn't like that.    Plan: She wants to wean off the Viibryd because she does not feel it  has been helpful and she does not want to increase the dosage.  07/03/2020 phone call patient stating she wanted to continue Viibryd 20 mg daily.  She also requested refill of Concerta 54 mg  9/52/8413 appointment with the following noted: Was told she couldn't take Viibryd with one of the pain meds and had TKR.  So stopped it.  Couldn't tolerate pain meds except tramadol.  Had more pain than expected.   Stopped Viibryd about 07/15/20.  She had taken tramadol with Viibryd in the past without a problem.   Noticing more pain after surgery in non-surgical places. Seems to be sensitive to getting lightheaded and confused with low blood sugar.   Poor attention and scattered since surgery.  Also more tired. Occ taken tramadol and otherwise just Tylenol. Impossible to tell the effect of the Viibryd given she hasn't been herself for months. Concerned about H's Courtney [name]'s memory who is also a patient here.  Wants him to get neuropsych testing. Plan because patient is med sensitive we will start very low-dose fluvoxamine 25 mg nightly Per her request continue Concerta 54 mg every morning  10/25/2020 appointment with the following noted: Several phone calls since being here.  The first led to increasing fluvoxamine to 1-1/2 of the 25 mg tablets due to lack of effect at 3 weeks. 09/21/2020 she called asking to stop Concerta which she did. She called again wanting to start Adderall.  Because  she had taken it in 2019 it was agreed that she could start Adderall 10 mg twice daily. Stress dealing with husband and whether to move or not.  [name] separate.  Anxiety is very high.  Hard to calm down around him. $ stress.  Explodes with anger at husband.  Doesn't think he can sell the house on his own. Never been this stressed and anxious and in this kind of circumstance before. Never got the Adderall DT need for PA. Only caffeine in the AM Plan: Rec increase Luvox from 37.5 mg daily to 50 mg HS for a week then increase to  75 mg daily and possibly higher.  B12 level checked 496 and normal. Hold Adderall until the anxiety is under control.   12/03/2020 phone call from patient: Patient called stating fluvoxamine made her more anxious and high strung and she stopped it.  She wanted an earlier appointment which was not available at the time she was given the option to see a nurse practitioner but refused.  12/19/2020 appointment with the following noted: Life very stressful right now and not getting better.  B in law died and marriage falling apart. Separating.   More than I can handle including anxiety and depression. Plan: Rec trial beta blocker propranolol 10-30 mg  twice daily for anxiety Consider TCA bc Genesight test  B12 level checked 496 and normal. Hold Adderall until the anxiety is under control.   01/01/2021 phone call from patient with nurse as follows: She is taking up to 30 mg propranolol twice daily without sufficient benefit for her anxiety.  01/03/2021 appointment with the following noted: Rare Adderall.  Propranolol didn't help anxiety much at 30 mg BID without SE.[name]   Thinks she's gotten depressed which is unusual.  In a perfect storm with divorce and moving and financial stress.  Not functioning well.  Hard to make a decision.  Poor productivity.   Having a lot of pain, chronically and worse lately. Starting Lyrica today. Taking alprazolam about 0.5 mg daily. Plan: Yes imipramine 25 and increase to 75 mg HS and then check blood level  02/26/21 appt noted: 01/21/2021 serum imipramine and desipramine total was 65 at 75 mg daily. It has made a difference.  But has made so many changes and moving.  Stress goes to GI system.   Was in ER last night with GI px and [name] have ulcer.  Unable to eat without nausea.  Lump in the throat nausea. Mild taking the edge off.  Can still explode. Can't remember the SE at 100 mg daily. GI dominating with pain and nausea. Sleep varies from good or bothered by sickness.  Usually fine but sometimes can't turn her brain off.  Moves in 5 days. Plan: Continue imipramine and increase to 100 mg again as soon as nausea managed. Add risperidone 1 mg HS off label for nausea and anxiety.  03/28/2021 appt noted: She is not taking the risperidone.  Tried it for a couple of weeks but did not help the nausea.  Didn't notice it helping anxiety but had tremor.  Off for 2 weeks and tremor better. Still on imipramine 75 mg nightly.  She did increase but wasn't sure it was causing the tremor as instructed. This week to GI and work up ordered and doubled med dose. Not handling the transition well.  Very irritable with husband.  Such a sense of urgency.   Not often with Xanax.   Not on Adderall.   Plan: imipramine and  increase to 100 mg nightly.  B12 level checked 496 and normal. Hold Adderall until the anxiety is under control.  Switch Xanax to Ativan 1 mg tid since she is not on Adderall.    05/02/2021 appt noted: Increased imipramine 100 mg HS. Tolerated the increase Spleen bleed. Yesterday decent energy compared to what she had.  Ativan made her sleepy but only took it once. Sleeping a lot DT spleen injury. Can't tell about depression bc of injury kept her in bed.  Has to move to feel normal. Plan: imipramine and increase to 150 mg nightly, based on prior level.  05/29/21 appt noted: SE dry mouth Increased imipramine 150 mg HS Seen improvement in mood.  Meeting irritating situations with less response.  Better self control.  Less fear and anxiety. Ativan 1 mg prn makes her too sleepy and tired. Occ taken 1/2 Adderall with some energy and motivation.   GI work up ongoing with gastric emptying yesterday.  It's delayed ongoing.  GI doctor has commented on her hostility.   Sleeping really well.   Worst dep and anxiety was when moving and now 50% better.  Need to have some goals and since moving into townhouse and unknown what is next.  [name] move out of town to be near the kids  at part of the year but it's too cold.  Big decisions are hard. Difficulty with back pain so hasn't unpacked.   More hopeful about her health. Seasonal depression Plan: Imipramine is helpful but increase could be more helpful based on level.  Disc pros and cons on increase.  She wants to increase it..  Consider lithium augmentation. Increase imipramine to 200 mg nightly, based on prior level Disc light therapy in detail and gave handout.    Adderall prn. Reduce Ativan 1/2 mg tid prn since she is tired from it and not on Adderall usually.    07/05/2021 phone call complaining of dry mouth with imipramine.  Frustrating because of reportedly low sodium and was told to drink less water. MD response:We increased the imipramine from 3 of the 50 mg tablets to 4 of the 50 mg tablets on February 1.  If she has seen additional improvement in her mood it is to her advantage to continue the current dosage.  If she has not seen additional improvement since increasing the dose she can drop the imipramine back to 3 tablets daily. In addition to that she should discuss the dry mouth problem with her pharmacist as the pharmacist [name] have some other recommendations to help manage it.  But the usual recommendation is to switch her toothpaste to Biotene toothpaste and use the Biotene mouthwash and Biotene spray to manage the dry mouth as best she can. Patient response:Notified patient. She said the issue is more complex than just dry mouth. She has been having GI issues and has been very sick, had a splenic bleed, is unable to get out of bed at times. Dr[facility] Derrek Mon[facility] at [facility] is her GI doctor. Patient spoke to her after Tressia Miners talked to her today and is wanting her to stop the imipramine to eliminate that as a source of her issues.   MD response:Okay she can come off the imipramine better mood anxiety and irritability will get worse off of it because it helped her.  However we cannot start any new medicines until we see if  her physical symptoms are any different off the medicine.  She can reduce imipramine by 1 tablet every 3 to  4 days.  The dry mouth will not resolve until she is off the medicine.  The splenic bleed is not related to imipramine unless she has some bizarre sort of allergy.  08/01/21 appt noted: Martin Majestic totally off imipramine for a week and GI px not better and restarted imipramine to 100 mg HS DT chronic constipation and then this week increased to 150 just this week.   Therapy with Mateo Flow helping a lot. Overall depression and anxiety is better. Trying to find endo.  Medically cleared from splenic bleed but doesn't think it is fine. Episode of abd paine with result in bed 5 days but back out of bed and functioning again. Hasn't needed lorazepam lately. Recognizes still angry and intense too much .   Plan check level and add lithium 300  10/07/21 appt noted; Taboo about lithium and didn't take it. Irritability is still focused and directed at H and not sure how it would be wihtout him.  Trying to address this with therapy and faith and prayer.   Seeing Godfrey Pick.   Everything fuels my fire.  Erupting this is never gonna change rage.  Recognizes this part is her problem.  Gets upset with him not doing things she wants done. Body getting stronger since Xmas and more she can do the better her depression.   Still some stiffness with pain in spine and leg in AM and then gets better. Not seriously depressed if can move. Don't handle inactivity well.  Able to do more now though has helped. Can't control her reaction to Merry Proud though knows it is over the top since she retired. Intermittent nausea with activity causes her to go to bed.  Waves of pain with it.  Frustrated cause is unknown. Plan: Continue imipramine 150 mg HS for a week and level was WNL at 238. Augment with lithium CR 300 mg HS for anger and mood  01/22/22 appt noted: Currently take imipramine 100 mg HS and lithium CR 300 mg HS, Adderall  10 BID Reduced imipramine to help tremor and it is some better.  Occ tremor, positional. Mood is a lot better with lithium.  Right away clearing of haze and calming down.  Better outlook and been like that since then. Questions about effects on organs.   Sleep well.   Not sig anxious now.  Occ moments of anxiety.   Asks if could stop imipramine bc benefit with lithium.   Past Psychiatric Medication Trials: Trintellix NR, sertraline?,  Viibryd 20, remote zoloft, Paxil, Cymbalta couple days ? Adverse reaction Imipramine 150 better Fluvoxamine SE anxiety but ? Adequacy of trial bc stress Fluvoxamine 100 in 2015 per Dr. [name] Lithium 300 helpful Gabapentin 1600 NR Lyrica  Risperidone 1 tremor Vyvanse, Adderall, concerta DC propranolol 10-30 mg  twice daily for anxiety bc not helpful, lorazepam History Genesight History of Dr. [name] and Pauline Good  Started therapy at 67 yo bc fear mother would die any minute bc she had cardiac px.  Review of Systems:  Review of Systems  Cardiovascular:  Negative for chest pain and palpitations.  Gastrointestinal:  Positive for abdominal pain and nausea.  Musculoskeletal:  Positive for back pain.  Neurological:  Positive for dizziness and tremors.  Psychiatric/Behavioral:  Positive for dysphoric mood. Negative for agitation, confusion, decreased concentration and hallucinations. The patient is nervous/anxious.     Medications: I have reviewed the patient's current medications.  Current Outpatient Medications  Medication Sig Dispense Refill   albuterol (VENTOLIN HFA) 108 (90 Base)  MCG/ACT inhaler Inhale 2 puffs into the lungs every 4 (four) hours as needed for wheezing or shortness of breath. 18 g 3   cyclobenzaprine (FLEXERIL) 10 MG tablet Take 10 mg by mouth as needed.     fluticasone (FLONASE) 50 MCG/ACT nasal spray Place 2 sprays into both nostrils daily. 16 g 1   imipramine (TOFRANIL) 50 MG tablet TAKE 3 TABLETS(150 MG) BY MOUTH AT  BEDTIME (Patient taking differently: 2 tabs) 180 tablet 0   levocetirizine (XYZAL) 5 MG tablet Take 1 tablet (5 mg total) by mouth every evening. 30 tablet 3   LORazepam (ATIVAN) 1 MG tablet Take 1 tablet (1 mg total) by mouth every 8 (eight) hours. (Patient taking differently: Take 1 mg by mouth as needed.) 90 tablet 1   meloxicam (MOBIC) 7.5 MG tablet TAKE 1 TO 2 TABLETS(7.5 TO 15 MG) BY MOUTH DAILY AS NEEDED FOR PAIN 180 tablet 0   methocarbamol (ROBAXIN) 500 MG tablet Take 1 tablet (500 mg total) by mouth every 6 (six) hours as needed for muscle spasms. 40 tablet 0   omega-3 acid ethyl esters (LOVAZA) 1 g capsule Take 2 capsules (2 g total) by mouth 2 (two) times daily. 120 capsule 11   ondansetron (ZOFRAN) 4 MG tablet Take 1 tablet (4 mg total) by mouth every 8 (eight) hours as needed for nausea or vomiting. 40 tablet 1   pravastatin (PRAVACHOL) 20 MG tablet Take one tablet by mouth 4 days per week 48 tablet 3   Testosterone 1.62 % GEL Place 5 mg onto the skin daily. 30 g 3   traMADol (ULTRAM) 50 MG tablet TAKE 1/2 TO 1 TABLET(25 TO 50 MG) BY MOUTH BACK EVERY 8 HOURS AS NEEDED FOR SEVERE PAIN 90 tablet 0   vitamin E 400 UNIT capsule Take 800 Units by mouth daily.      amphetamine-dextroamphetamine (ADDERALL) 10 MG tablet Take 1 tablet (10 mg total) by mouth 2 (two) times daily. 60 tablet 0   lithium carbonate (LITHOBID) 300 MG CR tablet Take 1 tablet (300 mg total) by mouth at bedtime. 90 tablet 0   nitroGLYCERIN (NITROSTAT) 0.4 MG SL tablet Place 1 tablet (0.4 mg total) under the tongue every 5 (five) minutes as needed for chest pain. (Patient not taking: Reported on 08/13/2021) 25 tablet 2   No current facility-administered medications for this visit.    Medication Side Effects: Other: dry mouth  Allergies:  Allergies  Allergen Reactions   Clarithromycin Nausea Only   Covid-19 (Mrna) Vaccine Other (See Comments)    Acute Vasculitis; excessive bleeding  Other reaction(s): Other (See  Comments) Acute Vasculitis; excessive bleeding    Other Other (See Comments)   Bactrim [Sulfamethoxazole-Trimethoprim] Nausea And Vomiting   Morphine Nausea And Vomiting    Other reaction(s): Nausea/Vomiting   Morphine And Related Nausea And Vomiting   Azithromycin Nausea And Vomiting and Nausea Only   Ceclor [Cefaclor] Nausea And Vomiting    Can take Augmentin ok   Claritin [Loratadine]     REACTION: bruises   Codeine Nausea And Vomiting   Cymbalta [Duloxetine Hcl]    Erythromycin Nausea And Vomiting    Other reaction(s): Nausea/Vomiting   Erythromycin Base Nausea And Vomiting   Levofloxacin Nausea Only    REACTION: nausea   Lipitor [Atorvastatin]     Other reaction(s): Myalgias (intolerance)   Nitrofurantoin Nausea Only   Oxycodone Other (See Comments)   Penicillins Nausea And Vomiting    Can take Augmentin   Propoxyphene Nausea  Only    dizzy   Propoxyphene N-Acetaminophen Nausea Only    dizzy    Past Medical History:  Diagnosis Date   ADD (attention deficit disorder)    Anxiety    Celiac disease    possible vs IBS   Complication of anesthesia    Coronary artery disease    Depression    no bipolar per Dr. [name]   GI problem    Olevia Perches   Gluten intolerance    Hyperlipidemia    IBS (irritable bowel syndrome)    Osteoarthritis    Osteoporosis    Pneumonia    PONV (postoperative nausea and vomiting)    Rheumatoid arthritis ([place])    Seasonal allergies 01/08/2012   hx. of multiple bronchitis related to this.   Shoulder pain, right     Family History  Problem Relation Age of Onset   Heart disease Mother    Heart attack Mother    Prostate cancer Father    Heart disease Sister    Heart disease Maternal Grandmother    Breast cancer Paternal Grandmother    Colon cancer Paternal Grandfather    Coronary artery disease Other        FH Female 1st degree relative <60   ADD / ADHD Other    Other Daughter        gluten intolerance    Social History    Socioeconomic History   Marital status: Married    Spouse name: Not on file   Number of children: 2   Years of education: Not on file   Highest education level: Not on file  Occupational History   Occupation: Futures trader  Tobacco Use   Smoking status: Never   Smokeless tobacco: Never   Tobacco comments:    only Art gallery manager Use: Never used  Substance and Sexual Activity   Alcohol use: Yes    Comment: 0-1 per day   Drug use: No   Sexual activity: Yes  Other Topics Concern   Not on file  Social History Narrative   Married for last 42 years.Lives with husband.Retired Insurance account manager.Originally from [place].   Social Determinants of Health   Financial Resource Strain: Low Risk  (01/23/2021)   Overall Financial Resource Strain (CARDIA)    Difficulty of Paying Living Expenses: Not hard at all  Food Insecurity: No Food Insecurity (01/23/2021)   Hunger Vital Sign    Worried About Running Out of Food in the Last Year: Never true    Ran Out of Food in the Last Year: Never true  Transportation Needs: No Transportation Needs (01/23/2021)   PRAPARE - Hydrologist (Medical): No    Lack of Transportation (Non-Medical): No  Physical Activity: Sufficiently Active (01/23/2021)   Exercise Vital Sign    Days of Exercise per Week: 5 days    Minutes of Exercise per Session: 30 min  Stress: No Stress Concern Present (01/23/2021)   [place]    Feeling of Stress : Not at all  Social Connections: [place] (01/23/2021)   Social Connection and Isolation Panel [NHANES]    Frequency of Communication with Friends and Family: More than three times a week    Frequency of Social Gatherings with Friends and Family: More than three times a week    Attends Religious Services: 1 to 4 times per year    Active Member of [employer] or Organizations:  Yes    Attends Club or  Organization Meetings: 1 to 4 times per year    Marital Status: Married  Human resources officer Violence: Not At Risk (01/23/2021)   Humiliation, Afraid, Rape, and Kick questionnaire    Fear of Current or Ex-Partner: No    Emotionally Abused: No    Physically Abused: No    Sexually Abused: No    Past Medical History, Surgical history, Social history, and Family history were reviewed and updated as appropriate.   Please see review of systems for further details on the patient's review from today.   Objective:   Physical Exam:  BP 130/78   Pulse 91   LMP 01/08/1999   Physical Exam Constitutional:      General: She is not in acute distress. Musculoskeletal:        General: No deformity.  Neurological:     Mental Status: She is alert and oriented to person, place, and time.     Coordination: Coordination normal.  Psychiatric:        Attention and Perception: Attention and perception normal. She does not perceive auditory or visual hallucinations.        Mood and Affect: Mood is not anxious or depressed. Affect is not labile, blunt, angry or inappropriate.        Speech: Speech normal. Speech is not slurred.        Behavior: Behavior is not hyperactive.        Thought Content: Thought content normal. Thought content is not paranoid or delusional. Thought content does not include homicidal or suicidal ideation. Thought content does not include suicidal plan.        Cognition and Memory: Cognition and memory normal.        Judgment: Judgment normal.     Comments: Insight intact Hyperactive style less intense and less depressed Markedly less Stressed &  Irritabile. Not pressured. Markedly Better with imipramine & lithium.     Lab Review:     Component Value Date/Time   NA 130 (L) 04/17/2021 1825   NA 134 06/14/2018 1444   K 3.8 04/17/2021 1825   CL 97 (L) 04/17/2021 1825   CO2 26 04/17/2021 1825   GLUCOSE 92 04/17/2021 1825   BUN 15 04/17/2021 1825   BUN 10 06/14/2018 1444    CREATININE 1.00 04/17/2021 1825   CREATININE 0.76 04/10/2020 1431   CALCIUM 9.0 04/17/2021 1825   PROT 6.8 02/25/2021 1614   PROT 6.5 12/19/2020 0754   ALBUMIN 4.1 02/25/2021 1614   ALBUMIN 4.5 12/19/2020 0754   AST 16 02/25/2021 1614   ALT 12 02/25/2021 1614   ALKPHOS 47 02/25/2021 1614   BILITOT 0.3 02/25/2021 1614   BILITOT 0.4 12/19/2020 0754   GFRNONAA 60 (L) 04/17/2021 1825   GFRNONAA 79 04/10/2020 1431   GFRAA 91 04/10/2020 1431       Component Value Date/Time   WBC 5.0 04/17/2021 1825   RBC 3.53 (L) 04/17/2021 1825   HGB 11.1 (L) 04/17/2021 1825   HCT 33.4 (L) 04/17/2021 1825   PLT 318 04/17/2021 1825   MCV 94.6 04/17/2021 1825   MCH 31.4 04/17/2021 1825   MCHC 33.2 04/17/2021 1825   RDW 12.5 04/17/2021 1825   LYMPHSABS 2.0 06/14/2018 0005   MONOABS 0.7 06/14/2018 0005   EOSABS 0.2 06/14/2018 0005   BASOSABS 0.0 06/14/2018 0005    No results found for: "POCLITH", "LITHIUM"   No results found for: "PHENYTOIN", "PHENOBARB", "VALPROATE", "CBMZ"   06/04/20 B12  normal at 496  01/21/2021 imipramine level 25, desipramine level 40 equals total 65 which is low on 75 mg daily. 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 They have one copy of the C allele and one copy of the G allele. This genotype suggests a normal response to certain ADHD medications.  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 This genotype suggests a lower risk of serious hypersensitivity reactions, including Stevens-Johnson syndrome (SJS), toxic epidermal necrolysis (TEN), maculopapular eruptions, and Drug Reaction with Eosinophilia and Systemic Symptoms when taking certain mood stabilizers.  HLA-B*1502Lower Risk Not  Present This patient does not carry the HLA-B*1502 allele or a closely related *15 allele. Absence of HLA-B*1502 and the closely related *15 alleles suggests lower risk of serious dermatologic reactions including toxic epidermal necrolysis (TEN) and Stevens-Johnson syndrome (SJS) when taking certain mood stabilizers.  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 They carry two copies of the G allele. This genotype has been associated with an increased risk of adverse drug reactions with certain selective serotonin reuptake inhibitors.  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 The short promoter allele is reported to decrease expression of the serotonin transporter compared to the homozygous long promoter allele. The patient [name] have a moderately decreased likelihood of response to certain selective serotonin reuptake inhibitors due to the presence of the short form of the gene.  Pharmacokinetic Genes PK CES1A1Extensive (Normal) Metabolizer GLY/GLY CES1A1 - Gly allele enzyme activity: Normal CES1A1 - Gly allele enzyme activity: Normal  This genotype is most consistent with the extensive (normal) metabolizer phenotype.  The patient is expected to have normal enzyme activity.  CYP1A2Extensive (Normal) Metabolizer *1/*1 This genotype is most consistent with the extensive (normal) metabolizer phenotype.  CYP2B6Poor Metabolizer *6/*6 CYP2B6*6 allele enzyme activity: Reduced CYP2B6*6 allele enzyme activity: Reduced  This genotype is most consistent with the poor metabolizer phenotype. This patient [name] have reduced enzyme activity as compared to individuals with the normal phenotype.  CYP2C19Extensive (Normal) Metabolizer *1/*1 CYP2C19*1 allele enzyme activity: Normal CYP2C19*1 allele enzyme  activity: Normal  This genotype is most consistent with the extensive (normal) metabolizer phenotype.  CYP2C9Intermediate Metabolizer *1/*2 CYP2C9*1 allele enzyme activity: Normal CYP2C9*2 allele enzyme activity: Reduced  This genotype is most consistent with the intermediate metabolizer phenotype. This patient [name] have reduced enzyme activity as compared to individuals with the normal phenotype.  CYP2D6Extensive (Normal) Metabolizer *1/*41 CYP2D6*1 allele enzyme activity: Normal CYP2D6*41 allele enzyme activity: Reduced  This genotype is most consistent with the extensive (normal) metabolizer phenotype.  CYP3A4Poor Metabolizer *22/*22 CYP3A4*22 allele enzyme activity: Reduced CYP3A4*22 allele enzyme activity: Reduced  This genotype is most consistent with the poor metabolizer phenotype. This patient [name] have reduced enzyme activity as compared to individuals with the normal phenotype.  UGT1A4Ultrarapid Metabolizer *1/*3 UGT1A4*1 allele enzyme activity: Normal UGT1A4*3 allele enzyme activity: Increased  This genotype is most consistent with the ultrarapid metabolizer phenotype. This patient [name] have increased enzyme activity as compared to individuals with the normal phenotype.  UGT2B15Intermediate Metabolizer *2/*2 UGT2B15*2 allele enzyme activity: Reduced UGT2B15*2 allele enzyme activity: Reduced  This genotype is most consistent with the intermediate metabolizer phenotype. This patient [name] have  reduced enzyme activity as compared to individuals with the normal phenotype. .res Assessment: Plan:    Courtney [name] was seen today for follow-up, depression, anxiety and adhd.  Diagnoses and all orders for this visit:  Major depressive disorder, recurrent episode, moderate (HCC) -     lithium carbonate (LITHOBID) 300 MG CR tablet; Take 1 tablet (300 mg total) by mouth at bedtime.  Generalized anxiety disorder -     lithium carbonate (LITHOBID) 300 MG CR tablet; Take 1 tablet  (300 mg total) by mouth at bedtime.  Attention deficit hyperactivity disorder (ADHD), combined type -     amphetamine-dextroamphetamine (ADDERALL) 10 MG tablet; Take 1 tablet (10 mg total) by mouth 2 (two) times daily.  Other iron deficiency anemia   Greater than 50% of 30 min face to face time with patient was spent on counseling and coordination of care. We discussed  Genesight testing and her desire to take something for anxiety that is in the "Use as Directed" column.   Overall depression is better and anxiety is better too.  Chose TCA bc Genesight test;  disc SE in detail incl cardiac, etc Too early to reduce this again.   Continue imipramine 100 mg HS for a week and level was WNL at 238.  Continue lithium CR 300 mg HS bc helped anger and mood Disc fear and stigma around lithium.  She agrees   B12 level checked 496 and normal.  Disc light therapy in detail and gave handout.     Adderall prn. Says 5 mg tablets less effective than 1/2 of a 10 mg tablet.  Taking and more productive and helps anxiety and function.  Not currently using , Ativan 1/2 mg tid prn since she is tired from it and not on Adderall usually.    Ok prn propranolol 10-30 mg BID prn tremor.  Did not help anxiety might help tremor.  Consider alternatives for anger outbursts, impulsivity like lithium, VPA, atypical.  Constipation management 1.  Lots of water 2.  Powdered fiber supplement such as MiraLAX, Citrucel, etc. preferably with a meal 3.  2 stool softeners a day 4.  Milk of magnesia or magnesium tablets if needed  Supportive therapy around marital crisis and other stressors.  She needs some goals.  Needs to get out of the house chronically.  Started therapy with Godfrey Pick.  FU 8 weeks  Lynder Parents, MD, DFAPA   Please see After Visit Summary for patient specific instructions.  Future Appointments  Date Time Provider [place]  02/19/2022 11:30 AM Cottle, Billey Co., MD [facility]  None  02/26/2022  8:45 AM [facility] ADVISOR [facility] None  02/27/2022  9:30 AM Plotnikov, Evie Lacks, MD [facility] None    No orders of the defined types were placed in this encounter.     -------------------------------

## 2022-01-30 ENCOUNTER — Encounter: Payer: Self-pay | Admitting: *Deleted

## 2022-01-30 ENCOUNTER — Telehealth: Payer: Self-pay | Admitting: *Deleted

## 2022-01-30 NOTE — Patient Outreach (Signed)
  Care Coordination   01/30/2022 Name: Tene Gato MRN: [id number] DOB: 1950/06/17   Care Coordination Outreach Attempts:  An unsuccessful telephone outreach was attempted today to offer the patient information about available care coordination services as a benefit of their health plan.   Follow Up Plan:  Additional outreach attempts will be made to offer the patient care coordination information and services.   Encounter Outcome:  No Answer left voice message requesting call back  Care Coordination Interventions Activated:  No   Care Coordination Interventions:  No, not indicated unsuccessful outreach attempt # 1    Oneta Rack, RN, BSN, CCRN Alumnus RN CM Care Coordination/ Transition of [place] Management [phone]: direct office

## 2022-02-04 ENCOUNTER — Encounter: Payer: Self-pay | Admitting: *Deleted

## 2022-02-04 ENCOUNTER — Telehealth: Payer: Self-pay | Admitting: *Deleted

## 2022-02-04 NOTE — Patient Outreach (Signed)
Care Coordination   Initial Visit Note   02/04/2022 Name: Courtney [name] MRN: [id number] DOB: 06-Feb-1951  Courtney [name] is a 71 y.o. year old female who sees Plotnikov, Evie Lacks, MD for primary care. I spoke with  Damaris Hippo by phone today.  What matters to the patients health and wellness today?  "I am doing much better since I saw Dr. Alain Marion in April, I have been taking the reduced dose of the meloxicam and not having any problems-- but my bowel changes have continued since I was in the hospital back in December- so I want to talk to him about this when I see him in November.  I don't know what happended with the scheduling of the Medicare Annual Wellness exam with the nurse-- when I scheduled it, I was told they would schedule it right before the 02/27/22 appointment with Dr. Alain Marion-- but I see that it is scheduled the day before-- if it is not on the same day as my PCP appointment, I am not going to have it done..... I want them both on 02/27/22 so I only come in to the office one time"  Interventions provided; no further or ongoing care coordination needs identified today    Goals Addressed             This Visit's Progress    Care Coordination Activities: No follow up required   On track    Care Coordination Interventions: Evaluation of current treatment plan related to sciatica and patient's adherence to plan as established by provider Advised patient to provide appropriate vaccination information to provider or CM team member at next visit Advised patient to check her [place] Chart account to determine when her upcoming office visits are for PCP and NHA- Medicare Annual Wellness exam is scheduled: noted from review of EHR that she has appointments for Medicare Annual Wellness Visit on 02/26/22 at 8:45 am and a PCP office visit on 02/27/22: states she does not want to go to both on different days-- she tells me that she was told the Medicare Annual Wellness visit  would occur just prior to the PCP office visit on 02/27/22-- I sent a care coordination message to [facility] team at [facility] to facilitate changing this appointment to 02/27/22 at 08:45 am if possible-- received follow up message from [facility] that she will follow up to facilitate patient's request Collaborated with NHA team at [facility] regarding re-scheduling Annual Medicare Wellness Visit, as above Reviewed scheduled/upcoming provider appointments including 02/26/22- Medicare Annual Wellness visit; 02/27/22- PCP office visit Advised patient to discuss ongoing bowel changes post- December 2022 hospitalization-- she wonders if she should have additional imaging to ensure that she is on correct treatment plan; I encouraged patient to quantify her bowel changes over the next few weeks to share trends during upcoming scheduled office visit with provider Assessed social determinant of health barriers Confirmed that patient has continued taking reduced dose of meloxicam as instructed during PCP office visit 08/20/21- states this reduced dose has helped and reports doing "much better now"           SDOH assessments and interventions completed:  Yes  SDOH Interventions Today    Flowsheet Row Most Recent Value  SDOH Interventions   Food Insecurity Interventions Intervention Not Indicated  Transportation Interventions Intervention Not Indicated  [drives self]       Care Coordination Interventions Activated:  Yes  Care Coordination Interventions:  Yes, provided   Follow up plan: No further intervention  required.   Encounter Outcome:  Pt. Visit Completed   Oneta Rack, RN, BSN, CCRN Alumnus RN CM Care Coordination/ Transition of [place] Management [phone]: direct office

## 2022-02-04 NOTE — Patient Instructions (Signed)
Visit Information  Thank you for taking time to visit with me today. Please don't hesitate to contact me if I can be of assistance to you.   Following are the goals we discussed today:   Goals Addressed             This Visit's Progress    Care Coordination Activities: No follow up required   On track    Care Coordination Interventions: Evaluation of current treatment plan related to sciatica and patient's adherence to plan as established by provider Advised patient to provide appropriate vaccination information to provider or CM team member at next visit Advised patient to check her [place] Chart account to determine when her upcoming office visits are for PCP and NHA- Medicare Annual Wellness exam is scheduled: noted from review of EHR that she has appointments for Medicare Annual Wellness Visit on 02/26/22 at 8:45 am and a PCP office visit on 02/27/22: states she does not want to go to both on different days-- she tells me that she was told the Medicare Annual Wellness visit would occur just prior to the PCP office visit on 02/27/22-- I sent a care coordination message to [facility] team at [facility] to facilitate changing this appointment to 02/27/22 at 08:45 am if possible-- received follow up message from [facility] that she will follow up to facilitate patient's request Collaborated with NHA team at [facility] regarding re-scheduling Annual Medicare Wellness Visit, as above Reviewed scheduled/upcoming provider appointments including 02/26/22- Medicare Annual Wellness visit; 02/27/22- PCP office visit Advised patient to discuss ongoing bowel changes post- December 2022 hospitalization-- she wonders if she should have additional imaging to ensure that she is on correct treatment plan; I encouraged patient to quantify her bowel changes over the next few weeks to share trends during upcoming scheduled office visit with provider Assessed social determinant of health barriers Confirmed that patient has  continued taking reduced dose of meloxicam as instructed during PCP office visit 08/20/21- states this reduced dose has helped and reports doing "much better now"           If you are experiencing a Mental Health or [place] or need someone to talk to, please  call the Suicide and Crisis Lifeline: 988 call the [place] National Suicide Prevention Lifeline: (682)794-6983 or TTY: 2533862950 TTY 980-797-9870) to talk to a trained counselor call 1-800-273-TALK (toll free, 24 hour hotline) go to [facility] [address], [place] [phone]) call the [place]: [phone] call 911   Patient verbalizes understanding of instructions and care plan provided today and agrees to view in [place]. Active MyChart status and patient understanding of how to access instructions and care plan via MyChart confirmed with patient.     No further follow up required: no further or ongoing care coordination needs identified today  Oneta Rack, RN, BSN, CCRN Alumnus RN CM Care Coordination/ Transition of [place] Management [phone]: direct office

## 2022-02-10 DIAGNOSIS — Z79899 Other long term (current) drug therapy: Secondary | ICD-10-CM | POA: Diagnosis not present

## 2022-02-10 DIAGNOSIS — M832 Adult osteomalacia due to malabsorption: Secondary | ICD-10-CM | POA: Diagnosis not present

## 2022-02-10 DIAGNOSIS — E538 Deficiency of other specified B group vitamins: Secondary | ICD-10-CM | POA: Diagnosis not present

## 2022-02-10 DIAGNOSIS — E039 Hypothyroidism, unspecified: Secondary | ICD-10-CM | POA: Diagnosis not present

## 2022-02-11 DIAGNOSIS — E538 Deficiency of other specified B group vitamins: Secondary | ICD-10-CM | POA: Diagnosis not present

## 2022-02-11 DIAGNOSIS — E039 Hypothyroidism, unspecified: Secondary | ICD-10-CM | POA: Diagnosis not present

## 2022-02-11 DIAGNOSIS — Z79899 Other long term (current) drug therapy: Secondary | ICD-10-CM | POA: Diagnosis not present

## 2022-02-14 DIAGNOSIS — M21371 Foot drop, right foot: Secondary | ICD-10-CM | POA: Diagnosis not present

## 2022-02-14 DIAGNOSIS — M479 Spondylosis, unspecified: Secondary | ICD-10-CM | POA: Diagnosis not present

## 2022-02-14 DIAGNOSIS — M4150 Other secondary scoliosis, site unspecified: Secondary | ICD-10-CM | POA: Diagnosis not present

## 2022-02-14 DIAGNOSIS — M542 Cervicalgia: Secondary | ICD-10-CM | POA: Diagnosis not present

## 2022-02-19 ENCOUNTER — Ambulatory Visit: Payer: Medicare Other | Admitting: Psychiatry

## 2022-02-21 ENCOUNTER — Other Ambulatory Visit: Payer: Self-pay | Admitting: Psychiatry

## 2022-02-21 ENCOUNTER — Telehealth: Payer: Self-pay | Admitting: Psychiatry

## 2022-02-21 DIAGNOSIS — F411 Generalized anxiety disorder: Secondary | ICD-10-CM

## 2022-02-21 DIAGNOSIS — F331 Major depressive disorder, recurrent, moderate: Secondary | ICD-10-CM

## 2022-02-21 MED ORDER — LITHIUM CARBONATE ER 300 MG PO TBCR: 300.0000 mg | EXTENDED_RELEASE_TABLET | Freq: Every evening | ORAL | 0 refills | Status: DC

## 2022-02-21 NOTE — Telephone Encounter (Signed)
Pt informed

## 2022-02-21 NOTE — Telephone Encounter (Signed)
Early refill ok.

## 2022-02-21 NOTE — Telephone Encounter (Signed)
Courtney [name] called today at 2:40 to report that she has lost her prescription of Lithium which she just picked up.  She has looked everywhere and just can't find it.  Can you please send in a new prescription with approval for early refill due to having lost the medication.  Send to [facility] in [place]

## 2022-02-21 NOTE — Telephone Encounter (Signed)
Sent RX

## 2022-02-26 ENCOUNTER — Ambulatory Visit: Payer: Medicare Other

## 2022-02-27 ENCOUNTER — Encounter: Payer: Self-pay | Admitting: Internal Medicine

## 2022-02-27 ENCOUNTER — Ambulatory Visit (INDEPENDENT_AMBULATORY_CARE_PROVIDER_SITE_OTHER): Payer: Medicare Other | Admitting: Internal Medicine

## 2022-02-27 ENCOUNTER — Ambulatory Visit (INDEPENDENT_AMBULATORY_CARE_PROVIDER_SITE_OTHER): Payer: Medicare Other

## 2022-02-27 VITALS — BP 120/70 | Temp 97.6°F | Ht 59.0 in | Wt 124.0 lb

## 2022-02-27 DIAGNOSIS — M5386 Other specified dorsopathies, lumbar region: Secondary | ICD-10-CM | POA: Diagnosis not present

## 2022-02-27 DIAGNOSIS — K5904 Chronic idiopathic constipation: Secondary | ICD-10-CM | POA: Diagnosis not present

## 2022-02-27 DIAGNOSIS — S36029D Unspecified contusion of spleen, subsequent encounter: Secondary | ICD-10-CM | POA: Diagnosis not present

## 2022-02-27 DIAGNOSIS — I2510 Atherosclerotic heart disease of native coronary artery without angina pectoris: Secondary | ICD-10-CM

## 2022-02-27 DIAGNOSIS — M059 Rheumatoid arthritis with rheumatoid factor, unspecified: Secondary | ICD-10-CM

## 2022-02-27 DIAGNOSIS — Z0000 Encounter for general adult medical examination without abnormal findings: Secondary | ICD-10-CM

## 2022-02-27 DIAGNOSIS — J4521 Mild intermittent asthma with (acute) exacerbation: Secondary | ICD-10-CM | POA: Diagnosis not present

## 2022-02-27 MED ORDER — TRAMADOL HCL 50 MG PO TABS: 50.0000 mg | ORAL_TABLET | Freq: Four times a day (QID) | ORAL | 1 refills | Status: AC | PRN

## 2022-02-27 NOTE — Assessment & Plan Note (Signed)
Colace daily, Miralax prn

## 2022-02-27 NOTE — Assessment & Plan Note (Signed)
Pt can take Tramadol, but not  taking. Re-start Tramadol prn Use Meloxicam prn

## 2022-02-27 NOTE — Assessment & Plan Note (Signed)
Cont on Proair HFA

## 2022-02-27 NOTE — Assessment & Plan Note (Addendum)
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

## 2022-02-27 NOTE — Patient Instructions (Addendum)
Courtney [name] , Thank you for taking time to come for your Medicare Wellness Visit. I appreciate your ongoing commitment to your health goals. Please review the following plan we discussed and let me know if I can assist you in the future.   These are the goals we discussed:  Goals      Care Coordination Activities: No follow up required     Care Coordination Interventions: Evaluation of current treatment plan related to sciatica and patient's adherence to plan as established by provider Advised patient to provide appropriate vaccination information to provider or CM team member at next visit Advised patient to check her [facility] account to determine when her upcoming office visits are for PCP and NHA- Medicare Annual Wellness exam is scheduled: noted from review of EHR that she has appointments for Medicare Annual Wellness Visit on 02/26/22 at 8:45 am and a PCP office visit on 02/27/22: states she does not want to go to both on different days-- she tells me that she was told the Medicare Annual Wellness visit would occur just prior to the PCP office visit on 02/27/22-- I sent a care coordination message to [facility] team at [facility] to facilitate changing this appointment to 02/27/22 at 08:45 am if possible-- received follow up message from [facility] that she will follow up to facilitate patient's request Collaborated with [facility] team at [facility] regarding re-scheduling Annual Medicare Wellness Visit, as above Reviewed scheduled/upcoming provider appointments including 02/26/22- Medicare Annual Wellness visit; 02/27/22- PCP office visit Advised patient to discuss ongoing bowel changes post- December 2022 hospitalization-- she wonders if she should have additional imaging to ensure that she is on correct treatment plan; I encouraged patient to quantify her bowel changes over the next few weeks to share trends during upcoming scheduled office visit with provider Assessed social determinant of health  barriers Confirmed that patient has continued taking reduced dose of meloxicam as instructed during PCP office visit 08/20/21- states this reduced dose has helped and reports doing "much better now"            This is a list of the screening recommended for you and due dates:  Health Maintenance  Topic Date Due   Hepatitis C Screening: USPSTF Recommendation to screen - Ages 27-79 yo.  Never done   Colon Cancer Screening  08/19/2010   Medicare Annual Wellness Visit  02/28/2023   Mammogram  05/22/2023   Tetanus Vaccine  07/05/2030   Pneumonia Vaccine  Completed   DEXA scan (bone density measurement)  Completed   Zoster (Shingles) Vaccine  Completed   HPV Vaccine  Aged Out   Flu Shot  Discontinued   COVID-19 Vaccine  Discontinued    Advanced directives: Yes; documents on file.  Conditions/risks identified: Yes  Next appointment: Follow up in one year for your annual wellness visit.   Preventive Care 5 Years and Older, Female Preventive care refers to lifestyle choices and visits with your health care provider that can promote health and wellness. What does preventive care include? A yearly physical exam. This is also called an annual well check. Dental exams once or twice a year. Routine eye exams. Ask your health care provider how often you should have your eyes checked. Personal lifestyle choices, including: Daily care of your teeth and gums. Regular physical activity. Eating a healthy diet. Avoiding tobacco and drug use. Limiting alcohol use. Practicing safe sex. Taking low-dose aspirin every day. Taking vitamin and mineral supplements as recommended by your health  care provider. What happens during an annual well check? The services and screenings done by your health care provider during your annual well check will depend on your age, overall health, lifestyle risk factors, and family history of disease. Counseling  Your health care provider may ask you questions  about your: Alcohol use. Tobacco use. Drug use. Emotional well-being. Home and relationship well-being. Sexual activity. Eating habits. History of falls. Memory and ability to understand (cognition). Work and work Statistician. Reproductive health. Screening  You may have the following tests or measurements: Height, weight, and BMI. Blood pressure. Lipid and cholesterol levels. These may be checked every 5 years, or more frequently if you are over 13 years old. Skin check. Lung cancer screening. You may have this screening every year starting at age 86 if you have a 30-pack-year history of smoking and currently smoke or have quit within the past 15 years. Fecal occult blood test (FOBT) of the stool. You may have this test every year starting at age 74. Flexible sigmoidoscopy or colonoscopy. You may have a sigmoidoscopy every 5 years or a colonoscopy every 10 years starting at age 11. Hepatitis C blood test. Hepatitis B blood test. Sexually transmitted disease (STD) testing. Diabetes screening. This is done by checking your blood sugar (glucose) after you have not eaten for a while (fasting). You may have this done every 1-3 years. Bone density scan. This is done to screen for osteoporosis. You may have this done starting at age 63. Mammogram. This may be done every 1-2 years. Talk to your health care provider about how often you should have regular mammograms. Talk with your health care provider about your test results, treatment options, and if necessary, the need for more tests. Vaccines  Your health care provider may recommend certain vaccines, such as: Influenza vaccine. This is recommended every year. Tetanus, diphtheria, and acellular pertussis (Tdap, Td) vaccine. You may need a Td booster every 10 years. Zoster vaccine. You may need this after age 86. Pneumococcal 13-valent conjugate (PCV13) vaccine. One dose is recommended after age 4. Pneumococcal polysaccharide (PPSV23)  vaccine. One dose is recommended after age 16. Talk to your health care provider about which screenings and vaccines you need and how often you need them. This information is not intended to replace advice given to you by your health care provider. Make sure you discuss any questions you have with your health care provider. Document Released: 05/11/2015 Document Revised: 01/02/2016 Document Reviewed: 02/13/2015 Elsevier Interactive Patient Education  2017 [place] Prevention in the Home Falls can cause injuries. They can happen to people of all ages. There are many things you can do to make your home safe and to help prevent falls. What can I do on the outside of my home? Regularly fix the edges of walkways and driveways and fix any cracks. Remove anything that might make you trip as you walk through a door, such as a raised step or threshold. Trim any bushes or trees on the path to your home. Use bright outdoor lighting. Clear any walking paths of anything that might make someone trip, such as rocks or tools. Regularly check to see if handrails are loose or broken. Make sure that both sides of any steps have handrails. Any raised decks and porches should have guardrails on the edges. Have any leaves, snow, or ice cleared regularly. Use sand or salt on walking paths during winter. Clean up any spills in your garage right away. This includes oil  or grease spills. What can I do in the bathroom? Use night lights. Install grab bars by the toilet and in the tub and shower. Do not use towel bars as grab bars. Use non-skid mats or decals in the tub or shower. If you need to sit down in the shower, use a plastic, non-slip stool. Keep the floor dry. Clean up any water that spills on the floor as soon as it happens. Remove soap buildup in the tub or shower regularly. Attach bath mats securely with double-sided non-slip rug tape. Do not have throw rugs and other things on the floor that  can make you trip. What can I do in the bedroom? Use night lights. Make sure that you have a light by your bed that is easy to reach. Do not use any sheets or blankets that are too big for your bed. They should not hang down onto the floor. Have a firm chair that has side arms. You can use this for support while you get dressed. Do not have throw rugs and other things on the floor that can make you trip. What can I do in the kitchen? Clean up any spills right away. Avoid walking on wet floors. Keep items that you use a lot in easy-to-reach places. If you need to reach something above you, use a strong step stool that has a grab bar. Keep electrical cords out of the way. Do not use floor polish or wax that makes floors slippery. If you must use wax, use non-skid floor wax. Do not have throw rugs and other things on the floor that can make you trip. What can I do with my stairs? Do not leave any items on the stairs. Make sure that there are handrails on both sides of the stairs and use them. Fix handrails that are broken or loose. Make sure that handrails are as long as the stairways. Check any carpeting to make sure that it is firmly attached to the stairs. Fix any carpet that is loose or worn. Avoid having throw rugs at the top or bottom of the stairs. If you do have throw rugs, attach them to the floor with carpet tape. Make sure that you have a light switch at the top of the stairs and the bottom of the stairs. If you do not have them, ask someone to add them for you. What else can I do to help prevent falls? Wear shoes that: Do not have high heels. Have rubber bottoms. Are comfortable and fit you well. Are closed at the toe. Do not wear sandals. If you use a stepladder: Make sure that it is fully opened. Do not climb a closed stepladder. Make sure that both sides of the stepladder are locked into place. Ask someone to hold it for you, if possible. Clearly mark and make sure that you  can see: Any grab bars or handrails. First and last steps. Where the edge of each step is. Use tools that help you move around (mobility aids) if they are needed. These include: Canes. Walkers. Scooters. Crutches. Turn on the lights when you go into a dark area. Replace any light bulbs as soon as they burn out. Set up your furniture so you have a clear path. Avoid moving your furniture around. If any of your floors are uneven, fix them. If there are any pets around you, be aware of where they are. Review your medicines with your doctor. Some medicines can make you feel dizzy. This can  increase your chance of falling. Ask your doctor what other things that you can do to help prevent falls. This information is not intended to replace advice given to you by your health care provider. Make sure you discuss any questions you have with your health care provider. Document Released: 02/08/2009 Document Revised: 09/20/2015 Document Reviewed: 05/19/2014 Elsevier Interactive Patient Education  2017 [employer].

## 2022-02-27 NOTE — Assessment & Plan Note (Signed)
Re-start Tramadol prn Use Meloxicam prn

## 2022-02-27 NOTE — Progress Notes (Signed)
Subjective:  Patient ID: Courtney [name], female    DOB: 1951-03-09  Age: 71 y.o. MRN: [id number]  CC: Follow-up (6 moth f/u)   HPI Courtney [name] presents for LBP, OA/RA Pt can take Tramadol, but not  taking  Pain is 4-8/10  F/u asthma, ADD   Outpatient Medications Prior to Visit  Medication Sig Dispense Refill   albuterol (VENTOLIN HFA) 108 (90 Base) MCG/ACT inhaler Inhale 2 puffs into the lungs every 4 (four) hours as needed for wheezing or shortness of breath. 18 g 3   amphetamine-dextroamphetamine (ADDERALL) 10 MG tablet Take 1 tablet (10 mg total) by mouth 2 (two) times daily. 60 tablet 0   cyclobenzaprine (FLEXERIL) 10 MG tablet Take 10 mg by mouth as needed.     imipramine (TOFRANIL) 50 MG tablet TAKE 3 TABLETS(150 MG) BY MOUTH AT BEDTIME (Patient taking differently: 2 tabs) 180 tablet 0   lithium carbonate (LITHOBID) 300 MG CR tablet Take 1 tablet (300 mg total) by mouth at bedtime. 90 tablet 0   LORazepam (ATIVAN) 1 MG tablet Take 1 tablet (1 mg total) by mouth every 8 (eight) hours. (Patient taking differently: Take 1 mg by mouth as needed.) 90 tablet 1   meloxicam (MOBIC) 7.5 MG tablet TAKE 1 TO 2 TABLETS(7.5 TO 15 MG) BY MOUTH DAILY AS NEEDED FOR PAIN 180 tablet 0   omega-3 acid ethyl esters (LOVAZA) 1 g capsule Take 2 capsules (2 g total) by mouth 2 (two) times daily. 120 capsule 11   pravastatin (PRAVACHOL) 20 MG tablet Take one tablet by mouth 4 days per week 48 tablet 3   Testosterone 1.62 % GEL Place 5 mg onto the skin daily. 30 g 3   traMADol (ULTRAM) 50 MG tablet TAKE 1/2 TO 1 TABLET(25 TO 50 MG) BY MOUTH BACK EVERY 8 HOURS AS NEEDED FOR SEVERE PAIN 90 tablet 0   vitamin E 400 UNIT capsule Take 800 Units by mouth daily.      nitroGLYCERIN (NITROSTAT) 0.4 MG SL tablet Place 1 tablet (0.4 mg total) under the tongue every 5 (five) minutes as needed for chest pain. (Patient not taking: Reported on 08/13/2021) 25 tablet 2   fluticasone (FLONASE) 50 MCG/ACT  nasal spray Place 2 sprays into both nostrils daily. (Patient not taking: Reported on 02/27/2022) 16 g 1   levocetirizine (XYZAL) 5 MG tablet Take 1 tablet (5 mg total) by mouth every evening. (Patient not taking: Reported on 02/27/2022) 30 tablet 3   methocarbamol (ROBAXIN) 500 MG tablet Take 1 tablet (500 mg total) by mouth every 6 (six) hours as needed for muscle spasms. (Patient not taking: Reported on 02/27/2022) 40 tablet 0   ondansetron (ZOFRAN) 4 MG tablet Take 1 tablet (4 mg total) by mouth every 8 (eight) hours as needed for nausea or vomiting. (Patient not taking: Reported on 02/27/2022) 40 tablet 1   No facility-administered medications prior to visit.    ROS: Review of Systems  Constitutional:  Positive for fatigue. Negative for activity change, appetite change, chills and unexpected weight change.  HENT:  Negative for congestion, mouth sores and sinus pressure.   Eyes:  Negative for visual disturbance.  Respiratory:  Negative for cough and chest tightness.   Gastrointestinal:  Negative for abdominal pain and nausea.  Genitourinary:  Negative for difficulty urinating, frequency and vaginal pain.  Musculoskeletal:  Positive for arthralgias and back pain. Negative for gait problem.  Skin:  Negative for pallor and rash.  Neurological:  Negative for dizziness, tremors,  weakness, numbness and headaches.  Psychiatric/Behavioral:  Positive for decreased concentration and suicidal ideas. Negative for confusion and sleep disturbance. The patient is nervous/anxious.     Objective:  BP 120/70 (BP Location: Left Arm)   Temp 97.6 F (36.4 C) (Oral)   Ht '4\' 11"'$  (1.499 m)   Wt 124 lb (56.2 kg)   LMP 01/08/1999   BMI 25.04 kg/m   BP Readings from Last 3 Encounters:  02/27/22 120/70  02/27/22 120/70  08/30/21 118/62    Wt Readings from Last 3 Encounters:  02/27/22 124 lb (56.2 kg)  02/27/22 124 lb (56.2 kg)  08/30/21 118 lb 6.4 oz (53.7 kg)    Physical Exam Constitutional:       General: She is not in acute distress.    Appearance: Normal appearance. She is well-developed.  HENT:     Head: Normocephalic.     Right Ear: External ear normal.     Left Ear: External ear normal.     Nose: Nose normal.  Eyes:     General:        Right eye: No discharge.        Left eye: No discharge.     Conjunctiva/sclera: Conjunctivae normal.     Pupils: Pupils are equal, round, and reactive to light.  Neck:     Thyroid: No thyromegaly.     Vascular: No JVD.     Trachea: No tracheal deviation.  Cardiovascular:     Rate and Rhythm: Normal rate and regular rhythm.     Heart sounds: Normal heart sounds.  Pulmonary:     Effort: No respiratory distress.     Breath sounds: No stridor. No wheezing.  Abdominal:     General: Bowel sounds are normal. There is no distension.     Palpations: Abdomen is soft. There is no mass.     Tenderness: There is no abdominal tenderness. There is no guarding or rebound.  Musculoskeletal:        General: Tenderness present.     Cervical back: Normal range of motion and neck supple. No rigidity.  Lymphadenopathy:     Cervical: No cervical adenopathy.  Skin:    Findings: No erythema or rash.  Neurological:     Mental Status: She is oriented to person, place, and time.     Cranial Nerves: No cranial nerve deficit.     Motor: No abnormal muscle tone.     Coordination: Coordination normal.     Deep Tendon Reflexes: Reflexes normal.  Psychiatric:        Behavior: Behavior normal.        Thought Content: Thought content normal.        Judgment: Judgment normal.   LS and other joints w/pain  Lab Results  Component Value Date   WBC 5.0 04/17/2021   HGB 11.1 (L) 04/17/2021   HCT 33.4 (L) 04/17/2021   PLT 318 04/17/2021   GLUCOSE 92 04/17/2021   CHOL 174 05/01/2021   TRIG 72 05/01/2021   HDL 78 05/01/2021   LDLDIRECT 120.7 08/04/2012   LDLCALC 82 05/01/2021   ALT 12 02/25/2021   AST 16 02/25/2021   NA 130 (L) 04/17/2021   K 3.8  04/17/2021   CL 97 (L) 04/17/2021   CREATININE 1.00 04/17/2021   BUN 15 04/17/2021   CO2 26 04/17/2021   TSH 0.35 (L) 08/27/2020   INR 1.0 07/06/2020   HGBA1C 5.4 02/11/2018    VAS US CAROTID  Result Date: 05/02/2021  Carotid Arterial Duplex Study Patient Name:  Deyana [name]  Date of Exam:   05/01/2021 Medical Rec #: [id number]             Accession #:    [id number] Date of Birth: [birth year]             Patient Gender: F Patient Age:   45 years Exam Location:  [facility] Procedure:      VAS US CAROTID Referring Phys: JILL MCDANIEL --------------------------------------------------------------------------------  Indications:       Carotid artery disease and patient denies any cerebrovascular                    symptoms. Risk Factors:      Hyperlipidemia, past history of smoking, coronary artery                    disease. Comparison Study:  In 12/2016, a carotid duplex showed velocities of 69/25 cm/s                    in the RICA 20/25 cm/s in the LICA. Performing Technologist: Sharlett Iles RVT  Examination Guidelines: A complete evaluation includes B-mode imaging, spectral Doppler, color Doppler, and power Doppler as needed of all accessible portions of each vessel. Bilateral testing is considered an integral part of a complete examination. Limited examinations for reoccurring indications may be performed as noted.  Right Carotid Findings: +----------+--------+--------+--------+------------------+--------+           PSV cm/sEDV cm/sStenosisPlaque DescriptionComments +----------+--------+--------+--------+------------------+--------+ CCA Prox  49      5                                          +----------+--------+--------+--------+------------------+--------+ CCA Distal60      13                                         +----------+--------+--------+--------+------------------+--------+ ICA Prox  50      11      Normal                              +----------+--------+--------+--------+------------------+--------+ ICA Mid   65      16                                         +----------+--------+--------+--------+------------------+--------+ ICA Distal81      24                                         +----------+--------+--------+--------+------------------+--------+ ECA       74      6                                          +----------+--------+--------+--------+------------------+--------+ +----------+--------+-------+----------------+-------------------+           PSV cm/sEDV cmsDescribe        Arm Pressure (mmHG) +----------+--------+-------+----------------+-------------------+ [id number]  Multiphasic, [id number]                 +----------+--------+-------+----------------+-------------------+ +---------+--------+--+--------+--+---------+ VertebralPSV cm/s87EDV cm/s14Antegrade +---------+--------+--+--------+--+---------+ Stable RICA velocities when compared to the prior exam. Left Carotid Findings: +----------+--------+--------+--------+------------------+--------+           PSV cm/sEDV cm/sStenosisPlaque DescriptionComments +----------+--------+--------+--------+------------------+--------+ CCA Prox  101     14                                         +----------+--------+--------+--------+------------------+--------+ CCA Distal83      17                                         +----------+--------+--------+--------+------------------+--------+ ICA Prox  44      11      Normal                             +----------+--------+--------+--------+------------------+--------+ ICA Mid   67      21                                         +----------+--------+--------+--------+------------------+--------+ ICA Distal59      16                                tortuous +----------+--------+--------+--------+------------------+--------+ ECA       105     10               heterogenous               +----------+--------+--------+--------+------------------+--------+ +----------+--------+--------+----------------+-------------------+           PSV cm/sEDV cm/sDescribe        Arm Pressure (mmHG) +----------+--------+--------+----------------+-------------------+ [id number]             Multiphasic, [id number]                 +----------+--------+--------+----------------+-------------------+ +---------+--------+--+--------+--+---------+ VertebralPSV cm/s77EDV cm/s16Antegrade +---------+--------+--+--------+--+---------+ Stable LICA velocities when compared to the prior exam.  Summary: Right Carotid: There is no evidence of stenosis in the right ICA. The                extracranial vessels were near-normal with only minimal wall                thickening or plaque. Left Carotid: There is no evidence of stenosis in the left ICA. Hemodynamically               significant plaque >50% visualized in the CCA. The extracranial               vessels were near-normal with only minimal wall thickening or               plaque. Vertebrals:  Bilateral vertebral arteries demonstrate antegrade flow. Subclavians: Normal flow hemodynamics were seen in bilateral subclavian              arteries. *See table(s) above for measurements and observations.  Electronically signed by Carlyle Dolly MD on 05/02/2021 at 3:53:58 PM.    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 Re-start Tramadol prn Use Meloxicam prn      Relevant Medications   traMADol (ULTRAM) 50 MG tablet    Sciatica of right side associated with disorder of lumbar spine    Re-start Tramadol prn Use Meloxicam prn         Meds ordered this encounter  Medications   traMADol (ULTRAM) 50 MG tablet    Sig: Take 1 tablet (50 mg total) by mouth every 6 (six) hours as needed for up to 5 days.    Dispense:  20 tablet    Refill:  1      Follow-up: Return in about 3 months (around 05/30/2022) for a follow-up visit.  Walker Kehr, MD

## 2022-02-27 NOTE — Progress Notes (Addendum)
Subjective:   Courtney [name] is a 71 y.o. female who presents for Medicare Annual (Subsequent) preventive examination.  Review of Systems     Cardiac Risk Factors include: advanced age (>53mn, >>78women);dyslipidemia;family history of premature cardiovascular disease     Objective:    Today's Vitals   02/27/22 0851  BP: 120/70  Temp: 97.6 F (36.4 C)  Weight: 124 lb (56.2 kg)  Height: _0  (1.499 m)  PainSc: 0-No pain   Body mass index is 25.04 kg/m.     02/27/2022    8:53 AM 04/17/2021    6:03 PM 04/17/2021    6:02 PM 04/15/2021    4:37 PM 02/25/2021    4:03 PM 01/23/2021   11:59 AM 07/16/2020   12:54 PM  Advanced Directives  Does Patient Have a Medical Advance Directive? Yes  _1   Type of AParamedicof ALa GrangeLiving will  HDeweyvilleLiving will HVidetteLiving will HAustinLiving will Living will;Healthcare Power of [place]Living will  Does patient want to make changes to medical advance directive? No - Patient declined  No - Patient declined   No - Patient declined No - Patient declined  Copy of HW[place]in Chart? Yes - validated most recent copy scanned in chart (See row information)  No - copy requested   No - copy requested No - copy requested  Would patient like information on creating a medical advance directive?  No - Patient declined No - Patient declined No - Patient declined       Current Medications (verified) Outpatient Encounter Medications as of 02/27/2022  Medication Sig   albuterol (VENTOLIN HFA) 108 (90 Base) MCG/ACT inhaler Inhale 2 puffs into the lungs every 4 (four) hours as needed for wheezing or shortness of breath.   amphetamine-dextroamphetamine (ADDERALL) 10 MG tablet Take 1 tablet (10 mg total) by mouth 2 (two) times daily.   cyclobenzaprine (FLEXERIL) 10 MG tablet Take 10 mg by mouth as  needed.   fluticasone (FLONASE) 50 MCG/ACT nasal spray Place 2 sprays into both nostrils daily.   imipramine (TOFRANIL) 50 MG tablet TAKE 3 TABLETS(150 MG) BY MOUTH AT BEDTIME (Patient taking differently: 2 tabs)   levocetirizine (XYZAL) 5 MG tablet Take 1 tablet (5 mg total) by mouth every evening.   lithium carbonate (LITHOBID) 300 MG CR tablet Take 1 tablet (300 mg total) by mouth at bedtime.   LORazepam (ATIVAN) 1 MG tablet Take 1 tablet (1 mg total) by mouth every 8 (eight) hours. (Patient taking differently: Take 1 mg by mouth as needed.)   meloxicam (MOBIC) 7.5 MG tablet TAKE 1 TO 2 TABLETS(7.5 TO 15 MG) BY MOUTH DAILY AS NEEDED FOR PAIN   methocarbamol (ROBAXIN) 500 MG tablet Take 1 tablet (500 mg total) by mouth every 6 (six) hours as needed for muscle spasms.   nitroGLYCERIN (NITROSTAT) 0.4 MG SL tablet Place 1 tablet (0.4 mg total) under the tongue every 5 (five) minutes as needed for chest pain. (Patient not taking: Reported on 08/13/2021)   omega-3 acid ethyl esters (LOVAZA) 1 g capsule Take 2 capsules (2 g total) by mouth 2 (two) times daily.   ondansetron (ZOFRAN) 4 MG tablet Take 1 tablet (4 mg total) by mouth every 8 (eight) hours as needed for nausea or vomiting.   pravastatin (PRAVACHOL) 20 MG tablet Take one tablet by mouth 4 days per week   Testosterone  1.62 % GEL Place 5 mg onto the skin daily.   traMADol (ULTRAM) 50 MG tablet TAKE 1/2 TO 1 TABLET(25 TO 50 MG) BY MOUTH BACK EVERY 8 HOURS AS NEEDED FOR SEVERE PAIN   vitamin E 400 UNIT capsule Take 800 Units by mouth daily.    No facility-administered encounter medications on file as of 02/27/2022.    Allergies (verified) Clarithromycin, Covid-19 (mrna) vaccine, Other, Bactrim [sulfamethoxazole-trimethoprim], Morphine, Morphine and related, Azithromycin, Ceclor [cefaclor], Claritin [loratadine], Codeine, Cymbalta [duloxetine hcl], Erythromycin, Erythromycin base, Levofloxacin, Lipitor [atorvastatin], Nitrofurantoin, Oxycodone,  Penicillins, Propoxyphene, and Propoxyphene n-acetaminophen   History: Past Medical History:  Diagnosis Date   ADD (attention deficit disorder)    Anxiety    Celiac disease    possible vs IBS   Complication of anesthesia    Coronary artery disease    Depression    no bipolar per Dr. [name]   GI problem    Olevia Perches   Gluten intolerance    Hyperlipidemia    IBS (irritable bowel syndrome)    Osteoarthritis    Osteoporosis    Pneumonia    PONV (postoperative nausea and vomiting)    Rheumatoid arthritis ([place])    Seasonal allergies 01/08/2012   hx. of multiple bronchitis related to this.   Shoulder pain, right    Past Surgical History:  Procedure Laterality Date   EXPLORATORY LAPAROTOMY  01/08/2012   due to endometriosis-portions of both ovaries removed   ROTATOR CUFF REPAIR Right    TONSILLECTOMY     TOTAL HIP ARTHROPLASTY Right    right. 2002   TOTAL HIP ARTHROPLASTY  01/14/2012   Procedure: TOTAL HIP ARTHROPLASTY;  Surgeon: Gearlean Alf, MD;  Location: [facility];  Service: Orthopedics;  Laterality: Left;   TOTAL KNEE ARTHROPLASTY Left 07/16/2020   Procedure: TOTAL KNEE ARTHROPLASTY;  Surgeon: Gaynelle Arabian, MD;  Location: [facility];  Service: Orthopedics;  Laterality: Left;  23mn   VENTRAL HERNIA REPAIR  01/08/2012   abdominal   Family History  Problem Relation Age of Onset   Heart disease Mother    Heart attack Mother    Prostate cancer Father    Heart disease Sister    Heart disease Maternal Grandmother    Breast cancer Paternal Grandmother    Colon cancer Paternal Grandfather    Coronary artery disease Other        FH Female 1st degree relative <60   ADD / ADHD Other    Other Daughter        gluten intolerance   Social History   Socioeconomic History   Marital status: Married    Spouse name: Not on file   Number of children: 2   Years of education: Not on file   Highest education level: Not on file  Occupational History   Occupation: IFutures trader  Tobacco Use   Smoking status: Never   Smokeless tobacco: Never   Tobacco comments:    only sArt gallery managerUse: Never used  Substance and Sexual Activity   Alcohol use: Yes    Comment: 0-1 per day   Drug use: No   Sexual activity: Yes  Other Topics Concern   Not on file  Social History Narrative   Married for last 42 years.Lives with husband.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 manager[place]   Social Determinants of Health   Financial Resource Strain: Low Risk  (02/27/2022)   Overall Financial Resource Strain (CARDIA)    Difficulty of Paying Living Expenses:  Not hard at all  Food Insecurity: No Food Insecurity (02/27/2022)   Hunger Vital Sign    Worried About Running Out of Food in the Last Year: Never true    Ran Out of Food in the Last Year: Never true  Transportation Needs: No Transportation Needs (02/27/2022)   PRAPARE - Hydrologist (Medical): No    Lack of Transportation (Non-Medical): No  Physical Activity: Sufficiently Active (02/27/2022)   Exercise Vital Sign    Days of Exercise per Week: 5 days    Minutes of Exercise per Session: 30 min  Stress: No Stress Concern Present (02/27/2022)   [place]    Feeling of Stress : Not at all  Social Connections: [place] (02/27/2022)   Social Connection and Isolation Panel [NHANES]    Frequency of Communication with Friends and Family: More than three times a week    Frequency of Social Gatherings with Friends and Family: More than three times a week    Attends Religious Services: 1 to 4 times per year    Active Member of [employer] or Organizations: Yes    Attends Archivist Meetings: 1 to 4 times per year    Marital Status: Married    Tobacco Counseling Counseling given: Not Answered Tobacco comments: only social   Clinical Intake:  Pre-visit preparation completed: Yes  Pain :  No/denies pain Pain Score: 0-No pain     BMI - recorded: 25.04 Nutritional Status: BMI 25 -29 Overweight Nutritional Risks: None Diabetes: No  How often do you need to have someone help you when you read instructions, pamphlets, or other written materials from your doctor or pharmacy?: 1 - Never What is the last grade level you completed in school?: Bachelor's Degree; some graduate work  Diabetic? no  Interpreter Needed?: No  Information entered by :: [employer], LPN.   Activities of Daily Living    02/27/2022    8:53 AM  In your present state of health, do you have any difficulty performing the following activities:  Hearing? 0  Vision? 0  Difficulty concentrating or making decisions? 0  Walking or climbing stairs? 0  Dressing or bathing? 0  Doing errands, shopping? 0  Preparing Food and eating ? N  Using the Toilet? N  In the past six months, have you accidently leaked urine? N  Do you have problems with loss of bowel control? N  Managing your Medications? N  Managing your Finances? N  Housekeeping or managing your Housekeeping? N    Patient Care Team: Plotnikov, Evie Lacks, MD as PCP - General Tamala Julian Lynnell Dike, MD as PCP - Cardiology (Cardiology) Pauline Good, NP (Psychiatry) Gaynelle Arabian, MD as Consulting Physician (Orthopedic Surgery) Cottle, Billey Co., MD as Attending Physician (Psychiatry) Gavin Pound, MD as Consulting Physician (Rheumatology) Marica Otter, [place] as Consulting Physician (Optometry) Aviva Signs, MD as Referring Physician (Gastroenterology)  Indicate any recent Medical Services you may have received from other than [facility] providers in the past year (date may be approximate).     Assessment:   This is a routine wellness examination for [place].  Hearing/Vision screen Hearing Screening - Comments:: Denies hearing difficulties   Vision Screening - Comments:: Wears rx glasses - up to date with routine eye exams with Marica Otter,  OD.   Dietary issues and exercise activities discussed: Current Exercise Habits: Home exercise routine, Type of exercise: walking, Time (Minutes): 30, Frequency (Times/Week): 5,  Weekly Exercise (Minutes/Week): 150, Intensity: Moderate, Exercise limited by: orthopedic condition(s);respiratory conditions(s)   Goals Addressed   None   Depression Screen    02/27/2022    8:53 AM 08/20/2021    8:34 AM 07/31/2021    9:50 AM 06/27/2021    8:59 AM 01/23/2021    9:00 AM 08/27/2020    3:14 PM 05/08/2020   11:56 AM  PHQ 2/9 Scores  PHQ - 2 Score 0 0 1 0 0 0 0  PHQ- 9 Score  0  0  0 1    Fall Risk    02/27/2022    8:53 AM 08/20/2021    8:35 AM 07/31/2021    9:50 AM 06/27/2021    8:59 AM 01/23/2021    8:55 AM  Fall Risk   Falls in the past year? 0 0 0 0 0  Number falls in past yr: 0  0 0 0  Injury with Fall? 0  0 0 0  Risk for fall due to : No Fall Risks  No Fall Risks  No Fall Risks  Follow up Falls prevention discussed  Falls evaluation completed  Falls evaluation completed    FALL RISK PREVENTION PERTAINING TO THE HOME:  Any stairs in or around the home? Yes  If so, are there any without handrails? No  Home free of loose throw rugs in walkways, pet beds, electrical cords, etc? Yes  Adequate lighting in your home to reduce risk of falls? Yes   ASSISTIVE DEVICES UTILIZED TO PREVENT FALLS:  Life alert? No  Use of a cane, walker or w/c? No  Grab bars in the bathroom? No  Shower chair or bench in shower? No  Elevated toilet seat or a handicapped toilet? No   TIMED UP AND GO:  Was the test performed? Yes .  Length of time to ambulate 10 feet: 6 sec.   Gait steady and fast without use of assistive device  Cognitive Function:        02/27/2022    8:53 AM  6CIT Screen  What Year? 0 points  What month? 0 points  What time? 0 points  Count back from 20 0 points  Months in reverse 0 points  Repeat phrase 0 points  Total Score 0 points    Immunizations Immunization History   Administered Date(s) Administered   Hep A / Hep B 03/07/2011, 04/04/2011, 10/02/2011   Influenza Inj Mdck Quad Pf 02/23/2017   Influenza Inj Mdck Quad With Preservative 02/10/2018   Influenza Split 03/24/2011, 04/06/2012   Influenza, High Dose Seasonal PF 04/20/2015, 04/16/2016, 02/01/2019   MMR 04/15/2011   Meningococcal Polysaccharide 04/04/2011   Moderna Sars-Covid-2 Vaccination 05/13/2019, 06/13/2019   Pneumococcal Conjugate-13 04/16/2016   Pneumococcal Polysaccharide-23 03/24/2011, 04/05/2018   Tdap 03/07/2011, 07/04/2020   Zoster Recombinat (Shingrix) 01/06/2017, 03/10/2017    TDAP status: Up to date  Flu Vaccine status: Due, Education has been provided regarding the importance of this vaccine. Advised may receive this vaccine at local pharmacy or Health Dept. Aware to provide a copy of the vaccination record if obtained from local pharmacy or Health Dept. Verbalized acceptance and understanding.  Pneumococcal vaccine status: Up to date  Covid-19 vaccine status: Completed vaccines  Qualifies for Shingles Vaccine? Yes   Zostavax completed No   Shingrix Completed?: Yes  Screening Tests Health Maintenance  Topic Date Due   Hepatitis C Screening  Never done   COLONOSCOPY (Pts 45-61yr Insurance coverage will need to be confirmed)  08/19/2010  Medicare Annual Wellness (AWV)  02/28/2023   MAMMOGRAM  05/22/2023   TETANUS/TDAP  07/05/2030   Pneumonia Vaccine 33+ Years old  Completed   DEXA SCAN  Completed   Zoster Vaccines- Shingrix  Completed   HPV VACCINES  Aged Out   INFLUENZA VACCINE  Discontinued   COVID-19 Vaccine  Discontinued    Health Maintenance  Health Maintenance Due  Topic Date Due   Hepatitis C Screening  Never done   COLONOSCOPY (Pts 45-64yr Insurance coverage will need to be confirmed)  08/19/2010    Colorectal cancer screening: Type of screening: Colonoscopy. Completed 08/18/2000. Repeat every 10 years  Mammogram status: Completed 05/21/2021.  Repeat every year  Bone Density status: Completed 05/21/2021. Results reflect: Bone density results: OSTEOPENIA. Repeat every 2 years.  Lung Cancer Screening: (Low Dose CT Chest recommended if Age 73-80years, 30 pack-year currently smoking OR have quit w/in 15years.) does not qualify.   Lung Cancer Screening Referral: no  Additional Screening:  Hepatitis C Screening: does qualify; Completed no  Vision Screening: Recommended annual ophthalmology exams for early detection of glaucoma and other disorders of the eye. Is the patient up to date with their annual eye exam?  Yes  Who is the provider or what is the name of the office in which the patient attends annual eye exams? SMarica Otter OD. If pt is not established with a provider, would they like to be referred to a provider to establish care? No .   Dental Screening: Recommended annual dental exams for proper oral hygiene  Community Resource Referral / Chronic Care Management: CRR required this visit?  No   CCM required this visit?  No      Plan:     I have personally reviewed and noted the following in the patient's chart:   Medical and social history Use of alcohol, tobacco or illicit drugs  Current medications and supplements including opioid prescriptions. Patient is not currently taking opioid prescriptions. Functional ability and status Nutritional status Physical activity Advanced directives List of other physicians Hospitalizations, surgeries, and ER visits in previous 12 months Vitals Screenings to include cognitive, depression, and falls Referrals and appointments  In addition, I have reviewed and discussed with patient certain preventive protocols, quality metrics, and best practice recommendations. A written personalized care plan for preventive services as well as general preventive health recommendations were provided to patient.     SSheral Flow LPN   [id number]  Nurse Notes: N/A  Medical  screening examination/treatment/procedure(s) were performed by non-physician practitioner and as supervising physician I was immediately available for consultation/collaboration.  I agree with above. ALew Dawes MD

## 2022-03-05 ENCOUNTER — Other Ambulatory Visit: Payer: Medicare Other

## 2022-03-11 ENCOUNTER — Ambulatory Visit
Admission: RE | Admit: 2022-03-11 | Discharge: 2022-03-11 | Disposition: A | Discharge status: Home / Self Care | Payer: Medicare Other | Source: Ambulatory Visit | Attending: Internal Medicine | Admitting: Internal Medicine

## 2022-03-11 ENCOUNTER — Telehealth: Payer: Self-pay | Admitting: *Deleted

## 2022-03-11 DIAGNOSIS — D7831 Postprocedural hematoma of the spleen following a procedure on the spleen: Secondary | ICD-10-CM | POA: Diagnosis not present

## 2022-03-11 NOTE — Telephone Encounter (Signed)
I have [place] PHONE ABT Courtney [name] c/o abd complete. She want to know where you trying to see the hematoma or spleen or both .   Dr. Alain Marion states....No. she has a h/o hematoma. Thx

## 2022-03-12 ENCOUNTER — Encounter: Payer: Self-pay | Admitting: Internal Medicine

## 2022-03-15 ENCOUNTER — Encounter: Payer: Self-pay | Admitting: Internal Medicine

## 2022-04-10 ENCOUNTER — Encounter: Payer: Self-pay | Admitting: Psychiatry

## 2022-04-10 ENCOUNTER — Ambulatory Visit (INDEPENDENT_AMBULATORY_CARE_PROVIDER_SITE_OTHER): Payer: Medicare Other | Admitting: Psychiatry

## 2022-04-10 DIAGNOSIS — D508 Other iron deficiency anemias: Secondary | ICD-10-CM | POA: Diagnosis not present

## 2022-04-10 DIAGNOSIS — F411 Generalized anxiety disorder: Secondary | ICD-10-CM | POA: Diagnosis not present

## 2022-04-10 DIAGNOSIS — F331 Major depressive disorder, recurrent, moderate: Secondary | ICD-10-CM | POA: Diagnosis not present

## 2022-04-10 DIAGNOSIS — I2510 Atherosclerotic heart disease of native coronary artery without angina pectoris: Secondary | ICD-10-CM | POA: Diagnosis not present

## 2022-04-10 DIAGNOSIS — F902 Attention-deficit hyperactivity disorder, combined type: Secondary | ICD-10-CM

## 2022-04-10 MED ORDER — AMPHETAMINE-DEXTROAMPHETAMINE 10 MG PO TABS: 10.0000 mg | ORAL_TABLET | Freq: Two times a day (BID) | ORAL | 0 refills | Status: DC

## 2022-04-10 NOTE — Progress Notes (Signed)
Courtney [name] [id number] 12-01-1950 71 y.o.  Subjective:   Patient ID:  Courtney [name] is a 71 y.o. (DOB Apr 14, 1951) female.  Chief Complaint:  No chief complaint on file.   Courtney [name]   Courtney [name] presents to the office today for follow-up of first visit 03/01/2020 referred by a friend [name].  Was prescribed Viibryd and Vyvanse was changed to Concerta 54 mg to try to reduce the amount of dry mouth.  05/02/2020 appointment with the following noted: Less dry mouth with Concerta but didn't seem to last beyond 3-4 PM. Viibryd 20 mg daily.  Hard to get enough calories at breakfast.  But has been taking both in the morning.  Some benefit with Viibryd. Is there something I can take besides a stimulant, bc fears it is stressing her body.  Dx college exhausted adrenal glands.  It helps.  Asked questions about alternatives to stimulants.  Reduced Concerta to reduce anxiety and see if can achieve, but she thinks it's worse in the evening. Anxiety is still a struggle.  Life crisis moment.  Chronic marital dissatisfaction worse now that both are retired.  Feels like she's in a prison.  Family notices she's stressed.  Has done therapy since age 33 yo.  Has done Hindu meditation without help.  Christian faith disciplines.  Grew up caretaking.  I'm done and I need to change.  Goal is calm down enough to function through the situation. Tingling foot and wants B12 testing. Plan: Switch Viibryd to 20 mg in evening meal to get better absorption.  06/18/2020 appointment with following noted: No difference in anxiety or mood with Viibryd 20 mg daily.  She doesn't want to increase it. Questions about Viibryd and Genesight testing.  Asks about differences between Adderall and MPH.   Missed for 3 days Concerta and took Adderall.    Tired by 4-5 PM and doesn't like that.    Plan: She wants to wean off the Viibryd because she does not feel it has been helpful and she does not want to increase the  dosage.  07/03/2020 phone call patient stating she wanted to continue Viibryd 20 mg daily.  She also requested refill of Concerta 54 mg  2/87/[id number] appointment with the following noted: Was told she couldn't take Viibryd with one of the pain meds and had TKR.  So stopped it.  Couldn't tolerate pain meds except tramadol.  Had more pain than expected.   Stopped Viibryd about 07/15/20.  She had taken tramadol with Viibryd in the past without a problem.   Noticing more pain after surgery in non-surgical places. Seems to be sensitive to getting lightheaded and confused with low blood sugar.   Poor attention and scattered since surgery.  Also more tired. Occ taken tramadol and otherwise just Tylenol. Impossible to tell the effect of the Viibryd given she hasn't been herself for months. Concerned about H's Geoff's memory who is also a patient here.  Wants him to get neuropsych testing. Plan because patient is med sensitive we will start very low-dose fluvoxamine 25 mg nightly Per her request continue Concerta 54 mg every morning  10/25/2020 appointment with the following noted: Several phone calls since being here.  The first led to increasing fluvoxamine to 1-1/2 of the 25 mg tablets due to lack of effect at 3 weeks. 09/21/2020 she called asking to stop Concerta which she did. She called again wanting to start Adderall.  Because she had taken it in 2019 it was agreed that she could  start Adderall 10 mg twice daily. Stress dealing with husband and whether to move or not.  May separate.  Anxiety is very high.  Hard to calm down around him. $ stress.  Explodes with anger at husband.  Doesn't think he can sell the house on his own. Never been this stressed and anxious and in this kind of circumstance before. Never got the Adderall DT need for PA. Only caffeine in the AM Plan: Rec increase Luvox from 37.5 mg daily to 50 mg HS for a week then increase to 75 mg daily and possibly higher.  B12 level checked 496  and normal. Hold Adderall until the anxiety is under control.   12/03/2020 phone call from patient: Patient called stating fluvoxamine made her more anxious and high strung and she stopped it.  She wanted an earlier appointment which was not available at the time she was given the option to see a nurse practitioner but refused.  12/19/2020 appointment with the following noted: Life very stressful right now and not getting better.  B in law died and marriage falling apart. Separating.   More than I can handle including anxiety and depression. Plan: Rec trial beta blocker propranolol 10-30 mg  twice daily for anxiety Consider TCA bc Genesight test  B12 level checked 496 and normal. Hold Adderall until the anxiety is under control.   01/01/2021 phone call from patient with nurse as follows: She is taking up to 30 mg propranolol twice daily without sufficient benefit for her anxiety.  01/03/2021 appointment with the following noted: Rare Adderall.  Propranolol didn't help anxiety much at 30 mg BID without SE.[name]   Thinks she's gotten depressed which is unusual.  In a perfect storm with divorce and moving and financial stress.  Not functioning well.  Hard to make a decision.  Poor productivity.   Having a lot of pain, chronically and worse lately. Starting Lyrica today. Taking alprazolam about 0.5 mg daily. Plan: Yes imipramine 25 and increase to 75 mg HS and then check blood level  02/26/21 appt noted: 01/21/2021 serum imipramine and desipramine total was 65 at 75 mg daily. It has made a difference.  But has made so many changes and moving.  Stress goes to GI system.   Was in ER last night with GI px and may have ulcer.  Unable to eat without nausea.  Lump in the throat nausea. Mild taking the edge off.  Can still explode. Can't remember the SE at 100 mg daily. GI dominating with pain and nausea. Sleep varies from good or bothered by sickness. Usually fine but sometimes can't turn her brain off.   Moves in 5 days. Plan: Continue imipramine and increase to 100 mg again as soon as nausea managed. Add risperidone 1 mg HS off label for nausea and anxiety.  03/28/2021 appt noted: She is not taking the risperidone.  Tried it for a couple of weeks but did not help the nausea.  Didn't notice it helping anxiety but had tremor.  Off for 2 weeks and tremor better. Still on imipramine 75 mg nightly.  She did increase but wasn't sure it was causing the tremor as instructed. This week to GI and work up ordered and doubled med dose. Not handling the transition well.  Very irritable with husband.  Such a sense of urgency.   Not often with Xanax.   Not on Adderall.   Plan: imipramine and increase to 100 mg nightly.  B12 level checked 496 and normal.  Hold Adderall until the anxiety is under control.  Switch Xanax to Ativan 1 mg tid since she is not on Adderall.    05/02/2021 appt noted: Increased imipramine 100 mg HS. Tolerated the increase Spleen bleed. Yesterday decent energy compared to what she had.  Ativan made her sleepy but only took it once. Sleeping a lot DT spleen injury. Can't tell about depression bc of injury kept her in bed.  Has to move to feel normal. Plan: imipramine and increase to 150 mg nightly, based on prior level.  05/29/21 appt noted: SE dry mouth Increased imipramine 150 mg HS Seen improvement in mood.  Meeting irritating situations with less response.  Better self control.  Less fear and anxiety. Ativan 1 mg prn makes her too sleepy and tired. Occ taken 1/2 Adderall with some energy and motivation.   GI work up ongoing with gastric emptying yesterday.  It's delayed ongoing.  GI doctor has commented on her hostility.   Sleeping really well.   Worst dep and anxiety was when moving and now 50% better.  Need to have some goals and since moving into townhouse and unknown what is next.  May move out of town to be near the kids at part of the year but it's too cold.  Big decisions  are hard. Difficulty with back pain so hasn't unpacked.   More hopeful about her health. Seasonal depression Plan: Imipramine is helpful but increase could be more helpful based on level.  Disc pros and cons on increase.  She wants to increase it..  Consider lithium augmentation. Increase imipramine to 200 mg nightly, based on prior level Disc light therapy in detail and gave handout.    Adderall prn. Reduce Ativan 1/2 mg tid prn since she is tired from it and not on Adderall usually.    07/05/2021 phone call complaining of dry mouth with imipramine.  Frustrating because of reportedly low sodium and was told to drink less water. MD response:We increased the imipramine from 3 of the 50 mg tablets to 4 of the 50 mg tablets on February 1.  If she has seen additional improvement in her mood it is to her advantage to continue the current dosage.  If she has not seen additional improvement since increasing the dose she can drop the imipramine back to 3 tablets daily. In addition to that she should discuss the dry mouth problem with her pharmacist as the pharmacist may have some other recommendations to help manage it.  But the usual recommendation is to switch her toothpaste to Biotene toothpaste and use the Biotene mouthwash and Biotene spray to manage the dry mouth as best she can. Patient response:Notified patient. She said the issue is more complex than just dry mouth. She has been having GI issues and has been very sick, had a splenic bleed, is unable to get out of bed at times. Dr. Derrek Monaco at [facility] is her GI doctor. Patient spoke to her after Tressia Miners talked to her today and is wanting her to stop the imipramine to eliminate that as a source of her issues.   MD response:Okay she can come off the imipramine better mood anxiety and irritability will get worse off of it because it helped her.  However we cannot start any new medicines until we see if her physical symptoms are any different off the  medicine.  She can reduce imipramine by 1 tablet every 3 to 4 days.  The dry mouth will not resolve until she is  off the medicine.  The splenic bleed is not related to imipramine unless she has some bizarre sort of allergy.  08/01/21 appt noted: Martin Majestic totally off imipramine for a week and GI px not better and restarted imipramine to 100 mg HS DT chronic constipation and then this week increased to 150 just this week.   Therapy with Mateo Flow helping a lot. Overall depression and anxiety is better. Trying to find endo.  Medically cleared from splenic bleed but doesn't think it is fine. Episode of abd paine with result in bed 5 days but back out of bed and functioning again. Hasn't needed lorazepam lately. Recognizes still angry and intense too much .   Plan check level and add lithium 300  10/07/21 appt noted; Taboo about lithium and didn't take it. Irritability is still focused and directed at H and not sure how it would be wihtout him.  Trying to address this with therapy and faith and prayer.   Seeing Godfrey Pick.   Everything fuels Courtney [name] fire.  Erupting this is never gonna change rage.  Recognizes this part is her problem.  Gets upset with him not doing things she wants done. Body getting stronger since Xmas and more she can do the better her depression.   Still some stiffness with pain in spine and leg in AM and then gets better. Not seriously depressed if can move. Don't handle inactivity well.  Able to do more now though has helped. Can't control her reaction to Merry Proud though knows it is over the top since she retired. Intermittent nausea with activity causes her to go to bed.  Waves of pain with it.  Frustrated cause is unknown. Plan: Continue imipramine 150 mg HS for a week and level was WNL at 238. Augment with lithium CR 300 mg HS for anger and mood  01/22/22 appt noted: Currently take imipramine 100 mg HS and lithium CR 300 mg HS, Adderall 10 BID Reduced imipramine to help tremor and  it is some better.  Occ tremor, positional.  Mood is a lot better with lithium.  Right away clearing of haze and calming down.  Better outlook and been like that since then. Questions about effects on organs.   Sleep well.   Not sig anxious now.  Occ moments of anxiety.   Asks if could stop imipramine bc benefit with lithium.  04/10/22 appt noted: Currently stopped imipramine 100 mg HS (2 days ago) and taking lithium CR 300 mg HS, Adderall 10 BID Good but have to stop the imipramine bc of the shaking.  She cut dose to 50 mg QOD. Tremor worse with R hand.  R handed.  Feels like lithium helped She wants to see what mood is like without imipramine.  Less tremor with less imipramine. Tremor is better with less right now. May need shoulder replacement DT pain.  Also needs spinal fusion DT dropped foot. Hard to talk about it bc so dreads it.   Irritability is there and when ignited is bad but working on it in therapy.    Been angry Courtney [name] whole life.  Back to trauma in childhood.  Overall though better with lithium.   Miralax helped GI problems but waves of ok and then GI px.   Needs Adderall daily. Takes Ativan just occ with stressful circumstances.  Past Psychiatric Medication Trials: Trintellix NR, sertraline?,  Viibryd 20, remote zoloft, Paxil, Cymbalta couple days ? Adverse reaction Imipramine 150 better Fluvoxamine SE anxiety but ? Adequacy of trial bc  stress Fluvoxamine 100 in 2015 per Dr. [name] Lithium 300 helpful Gabapentin 1600 NR Lyrica  Risperidone 1 tremor Vyvanse, Adderall, concerta DC propranolol 10-30 mg  twice daily for anxiety bc not helpful, lorazepam History Genesight History of Dr. [name] and Pauline Good  Started therapy at 71 yo bc fear mother would die any minute bc she had cardiac px.  Review of Systems:  Review of Systems  Cardiovascular:  Negative for chest pain and palpitations.  Gastrointestinal:  Positive for abdominal pain and nausea.  Musculoskeletal:   Positive for arthralgias and back pain.  Neurological:  Positive for dizziness, tremors and weakness.  Psychiatric/Behavioral:  Positive for dysphoric mood. Negative for agitation, confusion, decreased concentration and hallucinations. The patient is nervous/anxious.     Medications: I have reviewed the patient's current medications.  Current Outpatient Medications  Medication Sig Dispense Refill   albuterol (VENTOLIN HFA) 108 (90 Base) MCG/ACT inhaler Inhale 2 puffs into the lungs every 4 (four) hours as needed for wheezing or shortness of breath. 18 g 3   cyclobenzaprine (FLEXERIL) 10 MG tablet Take 10 mg by mouth as needed.     levothyroxine (SYNTHROID) 50 MCG tablet Take 50 mcg by mouth daily before breakfast.     lithium carbonate (LITHOBID) 300 MG CR tablet Take 1 tablet (300 mg total) by mouth at bedtime. 90 tablet 0   LORazepam (ATIVAN) 1 MG tablet Take 1 tablet (1 mg total) by mouth every 8 (eight) hours. (Patient taking differently: Take 1 mg by mouth as needed.) 90 tablet 1   meloxicam (MOBIC) 7.5 MG tablet TAKE 1 TO 2 TABLETS(7.5 TO 15 MG) BY MOUTH DAILY AS NEEDED FOR PAIN 180 tablet 0   omega-3 acid ethyl esters (LOVAZA) 1 g capsule Take 2 capsules (2 g total) by mouth 2 (two) times daily. 120 capsule 11   pravastatin (PRAVACHOL) 20 MG tablet Take one tablet by mouth 4 days per week 48 tablet 3   Testosterone 1.62 % GEL Place 5 mg onto the skin daily. 30 g 3   traMADol (ULTRAM) 50 MG tablet TAKE 1/2 TO 1 TABLET(25 TO 50 MG) BY MOUTH BACK EVERY 8 HOURS AS NEEDED FOR SEVERE PAIN 90 tablet 0   vitamin E 400 UNIT capsule Take 800 Units by mouth daily.      amphetamine-dextroamphetamine (ADDERALL) 10 MG tablet Take 1 tablet (10 mg total) by mouth 2 (two) times daily. 180 tablet 0   imipramine (TOFRANIL) 50 MG tablet TAKE 3 TABLETS(150 MG) BY MOUTH AT BEDTIME (Patient not taking: Reported on 04/10/2022) 180 tablet 0   nitroGLYCERIN (NITROSTAT) 0.4 MG SL tablet Place 1 tablet (0.4 mg  total) under the tongue every 5 (five) minutes as needed for chest pain. (Patient not taking: Reported on 08/13/2021) 25 tablet 2   No current facility-administered medications for this visit.    Medication Side Effects: Other: dry mouth  Allergies:  Allergies  Allergen Reactions   Clarithromycin Nausea Only   Covid-19 (Mrna) Vaccine Other (See Comments)    Acute Vasculitis; excessive bleeding  Other reaction(s): Other (See Comments) Acute Vasculitis; excessive bleeding    Other Other (See Comments)   Bactrim [Sulfamethoxazole-Trimethoprim] Nausea And Vomiting   Morphine Nausea And Vomiting    Other reaction(s): Nausea/Vomiting   Morphine And Related Nausea And Vomiting   Azithromycin Nausea And Vomiting and Nausea Only   Ceclor [Cefaclor] Nausea And Vomiting    Can take Augmentin ok   Claritin [Loratadine]     REACTION: bruises  Codeine Nausea And Vomiting    Can take Tramadol ok   Cymbalta [Duloxetine Hcl]    Erythromycin Nausea And Vomiting    Other reaction(s): Nausea/Vomiting   Erythromycin Base Nausea And Vomiting   Levofloxacin Nausea Only    REACTION: nausea   Lipitor [Atorvastatin]     Other reaction(s): Myalgias (intolerance)   Nitrofurantoin Nausea Only   Oxycodone Other (See Comments)   Penicillins Nausea And Vomiting    Can take Augmentin   Propoxyphene Nausea Only    dizzy   Propoxyphene N-Acetaminophen Nausea Only    dizzy    Past Medical History:  Diagnosis Date   ADD (attention deficit disorder)    Anxiety    Celiac disease    possible vs IBS   Complication of anesthesia    Coronary artery disease    Depression    no bipolar per Dr. [name]   GI problem    Olevia Perches   Gluten intolerance    Hyperlipidemia    IBS (irritable bowel syndrome)    Osteoarthritis    Osteoporosis    Pneumonia    PONV (postoperative nausea and vomiting)    Rheumatoid arthritis ([place])    Seasonal allergies 01/08/2012   hx. of multiple bronchitis related to this.    Shoulder pain, right     Family History  Problem Relation Age of Onset   Heart disease Mother    Heart attack Mother    Prostate cancer Father    Heart disease Sister    Heart disease Maternal Grandmother    Breast cancer Paternal Grandmother    Colon cancer Paternal Grandfather    Coronary artery disease Other        FH Female 1st degree relative <60   ADD / ADHD Other    Other Daughter        gluten intolerance    Social History   Socioeconomic History   Marital status: Married    Spouse name: Not on file   Number of children: 2   Years of education: Not on file   Highest education level: Not on file  Occupational History   Occupation: Futures trader  Tobacco Use   Smoking status: Never   Smokeless tobacco: Never   Tobacco comments:    only Art gallery manager Use: Never used  Substance and Sexual Activity   Alcohol use: Yes    Comment: 0-1 per day   Drug use: No   Sexual activity: Yes  Other Topics Concern   Not on file  Social History Narrative   Married for last 42 years.Lives with husband.Retired Insurance account manager.Originally from [place].   Social Determinants of Health   Financial Resource Strain: Low Risk  (02/27/2022)   Overall Financial Resource Strain (CARDIA)    Difficulty of Paying Living Expenses: Not hard at all  Food Insecurity: No Food Insecurity (02/27/2022)   Hunger Vital Sign    Worried About Running Out of Food in the Last Year: Never true    Ran Out of Food in the Last Year: Never true  Transportation Needs: No Transportation Needs (02/27/2022)   PRAPARE - Hydrologist (Medical): No    Lack of Transportation (Non-Medical): No  Physical Activity: Sufficiently Active (02/27/2022)   Exercise Vital Sign    Days of Exercise per Week: 5 days    Minutes of Exercise per Session: 30 min  Stress: No Stress Concern Present (02/27/2022)   [employer]    Feeling of Stress : Not at all  Social Connections: Socially Integrated (02/27/2022)   Social Connection and Isolation Panel [NHANES]    Frequency of Communication with Friends and Family: More than three times a week    Frequency of Social Gatherings with Friends and Family: More than three times a week    Attends Religious Services: 1 to 4 times per year    Active Member of [employer] or Organizations: Yes    Attends Archivist Meetings: 1 to 4 times per year    Marital Status: Married  Human resources officer Violence: Not At Risk (02/27/2022)   Humiliation, Afraid, Rape, and Kick questionnaire    Fear of Current or Ex-Partner: No    Emotionally Abused: No    Physically Abused: No    Sexually Abused: No    Past Medical History, Surgical history, Social history, and Family history were reviewed and updated as appropriate.   Please see review of systems for further details on the patient's review from today.   Objective:   Physical Exam:  LMP 01/08/1999   Physical Exam Constitutional:      General: She is not in acute distress. Musculoskeletal:        General: No deformity.  Neurological:     Mental Status: She is alert and oriented to person, place, and time.     Coordination: Coordination normal.  Psychiatric:        Attention and Perception: Attention and perception normal. She does not perceive auditory or visual hallucinations.        Mood and Affect: Mood is not anxious or depressed. Affect is not labile, blunt, angry or inappropriate.        Speech: Speech normal. Speech is not slurred.        Behavior: Behavior is not hyperactive.        Thought Content: Thought content normal. Thought content is not paranoid or delusional. Thought content does not include homicidal or suicidal ideation. Thought content does not include suicidal plan.        Cognition and Memory: Cognition and memory normal.        Judgment: Judgment normal.     Comments:  Insight intact Hyperactive style less intense and less depressed Markedly less Stressed & less Irritable. Not pressured. Markedly Better with imipramine & lithium. Minimal tremor today     Lab Review:     Component Value Date/Time   NA 130 (L) 04/17/2021 1825   NA 134 06/14/2018 1444   K 3.8 04/17/2021 1825   CL 97 (L) 04/17/2021 1825   CO2 26 04/17/2021 1825   GLUCOSE 92 04/17/2021 1825   BUN 15 04/17/2021 1825   BUN 10 06/14/2018 1444   CREATININE 1.00 04/17/2021 1825   CREATININE 0.76 04/10/2020 1431   CALCIUM 9.0 04/17/2021 1825   PROT 6.8 02/25/2021 1614   PROT 6.5 12/19/2020 0754   ALBUMIN 4.1 02/25/2021 1614   ALBUMIN 4.5 12/19/2020 0754   AST 16 02/25/2021 1614   ALT 12 02/25/2021 1614   ALKPHOS 47 02/25/2021 1614   BILITOT 0.3 02/25/2021 1614   BILITOT 0.4 12/19/2020 0754   GFRNONAA 60 (L) 04/17/2021 1825   GFRNONAA 79 04/10/2020 1431   GFRAA 91 04/10/2020 1431       Component Value Date/Time   WBC 5.0 04/17/2021 1825   RBC 3.53 (L) 04/17/2021 1825   HGB 11.1 (L) 04/17/2021 1825   HCT 33.4 (  L) 04/17/2021 1825   PLT 318 04/17/2021 1825   MCV 94.6 04/17/2021 1825   MCH 31.4 04/17/2021 1825   MCHC 33.2 04/17/2021 1825   RDW 12.5 04/17/2021 1825   LYMPHSABS 2.0 06/14/2018 0005   MONOABS 0.7 06/14/2018 0005   EOSABS 0.2 06/14/2018 0005   BASOSABS 0.0 06/14/2018 0005    No results found for: "POCLITH", "LITHIUM"   No results found for: "PHENYTOIN", "PHENOBARB", "VALPROATE", "CBMZ"   06/04/20 B12 normal at 496  01/21/2021 imipramine level 25, desipramine level 40 equals total 65 which is low on 75 mg daily. 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 They have one copy of the C allele and one copy of the G allele. This genotype suggests a normal response to certain ADHD  medications.  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 This genotype suggests a lower risk of serious hypersensitivity reactions, including Stevens-Johnson syndrome (SJS), toxic epidermal necrolysis (TEN), maculopapular eruptions, and Drug Reaction with Eosinophilia and Systemic Symptoms when taking certain mood stabilizers.  HLA-B*1502Lower Risk Not Present This patient does not carry the HLA-B*1502 allele or a closely related *15 allele. Absence of HLA-B*1502 and the closely related *15 alleles suggests lower risk of serious dermatologic reactions including toxic epidermal necrolysis (TEN) and Stevens-Johnson syndrome (SJS) when taking certain mood stabilizers.  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 They carry two copies of the G allele. This genotype has been associated with an increased risk of adverse drug reactions with certain selective serotonin reuptake inhibitors.  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 The short promoter allele is reported to decrease expression of the serotonin transporter compared to the homozygous long promoter allele. The patient may have a moderately decreased likelihood of response to certain selective serotonin reuptake inhibitors due to the presence of the short form of the gene.  Pharmacokinetic Genes PK CES1A1Extensive (Normal) Metabolizer GLY/GLY CES1A1 - Gly allele enzyme activity: Normal CES1A1 - Gly allele enzyme activity: Normal  This genotype is most consistent with the extensive (normal) metabolizer phenotype.  The patient is expected to have normal enzyme activity.  CYP1A2Extensive (Normal) Metabolizer *1/*1 This genotype is most  consistent with the extensive (normal) metabolizer phenotype.  CYP2B6Poor Metabolizer *6/*6 CYP2B6*6 allele enzyme activity: Reduced CYP2B6*6 allele enzyme activity: Reduced  This genotype is most consistent with the poor metabolizer phenotype. This patient may have reduced enzyme activity as compared to individuals with the normal phenotype.  CYP2C19Extensive (Normal) Metabolizer *1/*1 CYP2C19*1 allele enzyme activity: Normal CYP2C19*1 allele enzyme activity: Normal  This genotype is most consistent with the extensive (normal) metabolizer phenotype.  CYP2C9Intermediate Metabolizer *1/*2 CYP2C9*1 allele enzyme activity: Normal CYP2C9*2 allele enzyme activity: Reduced  This genotype is most consistent with the intermediate metabolizer phenotype. This patient may have reduced enzyme activity as compared to individuals with the normal phenotype.  CYP2D6Extensive (Normal) Metabolizer *1/*41 CYP2D6*1 allele enzyme activity: Normal CYP2D6*41 allele enzyme activity: Reduced  This genotype is most consistent with the extensive (normal) metabolizer phenotype.  CYP3A4Poor Metabolizer *22/*22 CYP3A4*22 allele enzyme activity: Reduced CYP3A4*22 allele enzyme activity: Reduced  This genotype is most consistent with the poor metabolizer phenotype. This patient may have  reduced enzyme activity as compared to individuals with the normal phenotype.  UGT1A4Ultrarapid Metabolizer *1/*3 UGT1A4*1 allele enzyme activity: Normal UGT1A4*3 allele enzyme activity: Increased  This genotype is most consistent with the ultrarapid metabolizer phenotype. This patient may have increased enzyme activity as compared to individuals with the normal phenotype.  UGT2B15Intermediate Metabolizer *2/*2 UGT2B15*2 allele enzyme activity: Reduced UGT2B15*2 allele enzyme activity: Reduced  This genotype is most consistent with the intermediate metabolizer phenotype. This patient may have reduced enzyme activity  as compared to individuals with the normal phenotype. .res Assessment: Plan:    Diagnoses and all orders for this visit:  Major depressive disorder, recurrent episode, moderate (HCC)  Generalized anxiety disorder  Attention deficit hyperactivity disorder (ADHD), combined type -     amphetamine-dextroamphetamine (ADDERALL) 10 MG tablet; Take 1 tablet (10 mg total) by mouth 2 (two) times daily.  Other iron deficiency anemia    Greater than 50% of 30 min face to face time with patient was spent on counseling and coordination of care. We discussed  Genesight testing and her desire to take something for anxiety that is in the "Use as Directed" column.   Overall depression is better and anxiety is better too.  Chose TCA bc Genesight test;  disc SE in detail incl cardiac, etc Too early to reduce this again.   on imipramine 100 mg HS for a week and level was WNL at 238. She stopped it this week and disc risk of relapse of dep but she hopes lithium will keep dep at bay  Continue lithium CR 300 mg HS bc helped anger and mood Disc fear and stigma around lithium.  She agrees   B12 level checked 496 and normal.  Disc light therapy in detail and gave handout.     Adderall prn. Says 5 mg tablets less effective than 1/2 of a 10 mg tablet.  Taking and more productive and helps anxiety and function.  Not currently using , Ativan 1/2 mg tid prn since she is tired from it and not on Adderall usually.    option propranolol 10-30 mg BID prn tremor.  Did not help anxiety might help tremor.  She doesn't want more med  Consider alternatives for anger outbursts, impulsivity like lithium, VPA, atypical.  Constipation management 1.  Lots of water 2.  Powdered fiber supplement such as MiraLAX, Citrucel, etc. preferably with a meal 3.  2 stool softeners a day 4.  Milk of magnesia or magnesium tablets if needed  Supportive therapy around marital crisis and other stressors.  She needs some goals.   Needs to get out of the house chronically.  Started therapy with God[facility] Pick.  FU 8 weeks  Lynder Parents, MD, DFAPA   Please see After Visit Summary for patient specific instructions.  No future appointments.   No orders of the defined types were placed in this encounter.     -------------------------------

## 2022-04-25 DIAGNOSIS — R269 Unspecified abnormalities of gait and mobility: Secondary | ICD-10-CM | POA: Diagnosis not present

## 2022-04-25 DIAGNOSIS — M25562 Pain in left knee: Secondary | ICD-10-CM | POA: Diagnosis not present

## 2022-05-03 ENCOUNTER — Other Ambulatory Visit: Payer: Self-pay | Admitting: Psychiatry

## 2022-05-03 DIAGNOSIS — F411 Generalized anxiety disorder: Secondary | ICD-10-CM

## 2022-05-03 DIAGNOSIS — F331 Major depressive disorder, recurrent, moderate: Secondary | ICD-10-CM

## 2022-05-06 DIAGNOSIS — G959 Disease of spinal cord, unspecified: Secondary | ICD-10-CM | POA: Diagnosis not present

## 2022-05-08 ENCOUNTER — Telehealth: Payer: Self-pay | Admitting: Psychiatry

## 2022-05-08 NOTE — Telephone Encounter (Signed)
Pt called and wanted to know if dr. Clovis Pu ever received communication from dr. Grandville Silos in October. It was about her lithium level. Please call her at [phone]

## 2022-05-08 NOTE — Telephone Encounter (Signed)
Patient called asking if you had received any communication from Dr. Grandville Silos about her lithium level. I see a lab result from 10/17 in Epic. I advised her of this but she said there was some other communication.

## 2022-05-08 NOTE — Telephone Encounter (Signed)
Thank you I reviewed the lithium level that they did and it was low as expected due to the low dose of the lithium.  I appreciate having the record and are reviewed the other labs that she had that day.  Everything looked appropriate.  No changes are indicated. However it is difficult for [place] to received written notes from physicians at [facility] and I did not see a specific written note to me from Dr. Grandville Silos.  But again I reviewed the labs and I do not see an indication for change or any concerns.

## 2022-05-08 NOTE — Telephone Encounter (Signed)
Notified patient.

## 2022-05-22 DIAGNOSIS — M47812 Spondylosis without myelopathy or radiculopathy, cervical region: Secondary | ICD-10-CM | POA: Diagnosis not present

## 2022-05-22 DIAGNOSIS — M4802 Spinal stenosis, cervical region: Secondary | ICD-10-CM | POA: Diagnosis not present

## 2022-05-22 DIAGNOSIS — G959 Disease of spinal cord, unspecified: Secondary | ICD-10-CM | POA: Diagnosis not present

## 2022-05-28 DIAGNOSIS — M48062 Spinal stenosis, lumbar region with neurogenic claudication: Secondary | ICD-10-CM | POA: Diagnosis not present

## 2022-06-04 DIAGNOSIS — M1991 Primary osteoarthritis, unspecified site: Secondary | ICD-10-CM | POA: Diagnosis not present

## 2022-06-04 DIAGNOSIS — Z79899 Other long term (current) drug therapy: Secondary | ICD-10-CM | POA: Diagnosis not present

## 2022-06-04 DIAGNOSIS — Z6823 Body mass index (BMI) 23.0-23.9, adult: Secondary | ICD-10-CM | POA: Diagnosis not present

## 2022-06-04 DIAGNOSIS — M545 Low back pain, unspecified: Secondary | ICD-10-CM | POA: Diagnosis not present

## 2022-06-04 DIAGNOSIS — M0579 Rheumatoid arthritis with rheumatoid factor of multiple sites without organ or systems involvement: Secondary | ICD-10-CM | POA: Diagnosis not present

## 2022-06-10 ENCOUNTER — Telehealth: Payer: Self-pay

## 2022-06-10 NOTE — Telephone Encounter (Signed)
Pt has stated her COVID test was NEGATIVE.

## 2022-06-10 NOTE — Telephone Encounter (Signed)
Pt has stated she is not feeling to well as she has been experiencing Low-Grade Fever, Congestion, Cough, Sore throat. Pt states sxs started 5 days ago and she seems to not be getting better. Pt has not taken COVID test.  ** Pt states she will take at home COVID test and call back with results.

## 2022-06-11 ENCOUNTER — Ambulatory Visit (INDEPENDENT_AMBULATORY_CARE_PROVIDER_SITE_OTHER): Payer: Medicare Other | Admitting: Internal Medicine

## 2022-06-11 ENCOUNTER — Encounter: Payer: Self-pay | Admitting: Internal Medicine

## 2022-06-11 VITALS — BP 120/70 | HR 94 | Temp 97.7°F | Wt 117.5 lb

## 2022-06-11 DIAGNOSIS — J0101 Acute recurrent maxillary sinusitis: Secondary | ICD-10-CM | POA: Diagnosis not present

## 2022-06-11 DIAGNOSIS — M1991 Primary osteoarthritis, unspecified site: Secondary | ICD-10-CM | POA: Insufficient documentation

## 2022-06-11 DIAGNOSIS — K5904 Chronic idiopathic constipation: Secondary | ICD-10-CM

## 2022-06-11 DIAGNOSIS — M542 Cervicalgia: Secondary | ICD-10-CM | POA: Insufficient documentation

## 2022-06-11 DIAGNOSIS — M21379 Foot drop, unspecified foot: Secondary | ICD-10-CM | POA: Insufficient documentation

## 2022-06-11 DIAGNOSIS — G959 Disease of spinal cord, unspecified: Secondary | ICD-10-CM | POA: Insufficient documentation

## 2022-06-11 DIAGNOSIS — M059 Rheumatoid arthritis with rheumatoid factor, unspecified: Secondary | ICD-10-CM

## 2022-06-11 MED ORDER — PROMETHAZINE-DM 6.25-15 MG/5ML PO SYRP: 5.0000 mL | ORAL_SOLUTION | Freq: Four times a day (QID) | ORAL | 0 refills | Status: DC | PRN

## 2022-06-11 MED ORDER — AMOXICILLIN-POT CLAVULANATE 500-125 MG PO TABS: 1.0000 | ORAL_TABLET | Freq: Three times a day (TID) | ORAL | 0 refills | Status: DC

## 2022-06-11 NOTE — Assessment & Plan Note (Addendum)
On daily Miralax GI ref to Dr Silverio Decamp was suggested

## 2022-06-11 NOTE — Progress Notes (Signed)
Subjective:  Patient ID: Courtney [name], female    DOB: 25-Jan-1951  Age: 72 y.o. MRN: [id number]  CC: No chief complaint on file.  URI sx's HPI Courtney [name] presents for URI sx's: not feeling to well as she has been experiencing Low-Grade Fever, Congestion, Cough, Sore throat. Pt states sxs started 7 days ago and she seems to not be getting better. Pt has taken COVID test - negative . Green nasal d/c  Pt can take Augmentin ok   C/o tremor    Outpatient Medications Prior to Visit  Medication Sig Dispense Refill   amphetamine-dextroamphetamine (ADDERALL) 10 MG tablet Take 1 tablet (10 mg total) by mouth 2 (two) times daily. 180 tablet 0   cyclobenzaprine (FLEXERIL) 10 MG tablet Take 10 mg by mouth as needed.     fluticasone (FLONASE) 50 MCG/ACT nasal spray Place into both nostrils daily.     imipramine (TOFRANIL) 50 MG tablet TAKE 3 TABLETS(150 MG) BY MOUTH AT BEDTIME (Patient taking differently: Take 50 mg by mouth. Taking '50mg'$  daily) 270 tablet 0   levothyroxine (SYNTHROID) 50 MCG tablet Take 50 mcg by mouth daily before breakfast.     lithium carbonate (LITHOBID) 300 MG CR tablet Take 1 tablet (300 mg total) by mouth at bedtime. (Patient taking differently: Take 150 mg by mouth at bedtime.) 90 tablet 0   LORazepam (ATIVAN) 1 MG tablet Take 1 tablet (1 mg total) by mouth every 8 (eight) hours. (Patient taking differently: Take 1 mg by mouth as needed.) 90 tablet 1   omega-3 acid ethyl esters (LOVAZA) 1 g capsule Take 2 capsules (2 g total) by mouth 2 (two) times daily. 120 capsule 11   pravastatin (PRAVACHOL) 20 MG tablet Take one tablet by mouth 4 days per week (Patient taking differently: Take 20 mg by mouth daily. Take one tablet by mouth 1-2 days per week) 48 tablet 3   Testosterone 1.62 % GEL Place 5 mg onto the skin daily. 30 g 3   traMADol (ULTRAM) 50 MG tablet TAKE 1/2 TO 1 TABLET(25 TO 50 MG) BY MOUTH BACK EVERY 8 HOURS AS NEEDED FOR SEVERE PAIN 90 tablet 0    vitamin E 400 UNIT capsule Take 800 Units by mouth daily.      albuterol (VENTOLIN HFA) 108 (90 Base) MCG/ACT inhaler Inhale 2 puffs into the lungs every 4 (four) hours as needed for wheezing or shortness of breath. 18 g 3   meloxicam (MOBIC) 7.5 MG tablet TAKE 1 TO 2 TABLETS(7.5 TO 15 MG) BY MOUTH DAILY AS NEEDED FOR PAIN 180 tablet 0   nitroGLYCERIN (NITROSTAT) 0.4 MG SL tablet Place 1 tablet (0.4 mg total) under the tongue every 5 (five) minutes as needed for chest pain. (Patient not taking: Reported on 08/13/2021) 25 tablet 2   No facility-administered medications prior to visit.    ROS: Review of Systems  Constitutional:  Positive for chills and fatigue. Negative for activity change, appetite change and unexpected weight change.  HENT:  Positive for congestion and postnasal drip. Negative for mouth sores and sinus pressure.   Eyes:  Negative for visual disturbance.  Respiratory:  Positive for cough. Negative for chest tightness.   Gastrointestinal:  Negative for abdominal pain and nausea.  Genitourinary:  Negative for difficulty urinating, frequency and vaginal pain.  Musculoskeletal:  Negative for back pain and gait problem.  Skin:  Negative for pallor and rash.  Neurological:  Negative for dizziness, tremors, weakness, numbness and headaches.  Psychiatric/Behavioral:  Negative for  confusion and sleep disturbance.     Objective:  BP 120/70 (BP Location: Left Arm, Patient Position: Sitting, Cuff Size: Normal)   Pulse 94   Temp 97.7 F (36.5 C) (Oral)   Wt 117 lb 8 oz (53.3 kg)   LMP 01/08/1999   SpO2 98%   BMI 23.73 kg/m   BP Readings from Last 3 Encounters:  06/11/22 120/70  02/27/22 120/70  02/27/22 120/70    Wt Readings from Last 3 Encounters:  06/11/22 117 lb 8 oz (53.3 kg)  02/27/22 124 lb (56.2 kg)  02/27/22 124 lb (56.2 kg)    Physical Exam Constitutional:      General: She is not in acute distress.    Appearance: Normal appearance. She is well-developed.   HENT:     Head: Normocephalic.     Right Ear: External ear normal.     Left Ear: External ear normal.     Nose: Nose normal.  Eyes:     General:        Right eye: No discharge.        Left eye: No discharge.     Conjunctiva/sclera: Conjunctivae normal.     Pupils: Pupils are equal, round, and reactive to light.  Neck:     Thyroid: No thyromegaly.     Vascular: No JVD.     Trachea: No tracheal deviation.  Cardiovascular:     Rate and Rhythm: Normal rate and regular rhythm.     Heart sounds: Normal heart sounds.  Pulmonary:     Effort: No respiratory distress.     Breath sounds: No stridor. No wheezing.  Abdominal:     General: Bowel sounds are normal. There is no distension.     Palpations: Abdomen is soft. There is no mass.     Tenderness: There is no abdominal tenderness. There is no guarding or rebound.  Musculoskeletal:        General: No tenderness.     Cervical back: Normal range of motion and neck supple. No rigidity.  Lymphadenopathy:     Cervical: No cervical adenopathy.  Skin:    Findings: No erythema or rash.  Neurological:     Cranial Nerves: No cranial nerve deficit.     Motor: No abnormal muscle tone.     Coordination: Coordination normal.     Deep Tendon Reflexes: Reflexes normal.  Psychiatric:        Behavior: Behavior normal.        Thought Content: Thought content normal.        Judgment: Judgment normal.   Erythematous nasal passages  Lab Results  Component Value Date   WBC 5.0 04/17/2021   HGB 11.1 (L) 04/17/2021   HCT 33.4 (L) 04/17/2021   PLT 318 04/17/2021   GLUCOSE 92 04/17/2021   CHOL 174 05/01/2021   TRIG 72 05/01/2021   HDL 78 05/01/2021   LDLDIRECT 120.7 08/04/2012   LDLCALC 82 05/01/2021   ALT 12 02/25/2021   AST 16 02/25/2021   NA 130 (L) 04/17/2021   K 3.8 04/17/2021   CL 97 (L) 04/17/2021   CREATININE 1.00 04/17/2021   BUN 15 04/17/2021   CO2 26 04/17/2021   TSH 0.35 (L) 08/27/2020   INR 1.0 07/06/2020   HGBA1C 5.4  02/11/2018    US Abdomen Limited  Result Date: 03/12/2022 CLINICAL DATA:  Follow-up splenic hematoma. EXAM: ULTRASOUND ABDOMEN LIMITED COMPARISON:  CT scan of the abdomen April 17, 2021. FINDINGS: The spleen is normal in size  measuring 4.2 x 9.1 x 5.2 cm. No evidence of hematoma or abnormality identified. IMPRESSION: The spleen is normal in size. No evidence of hematoma or abnormality identified. Electronically Signed   By: Dorise Bullion III M.D.   On: 03/12/2022 10:57    Assessment & Plan:   Problem List Items Addressed This Visit       Respiratory   Acute sinusitis    Prescribed Augmentin , Promethazine DM for cough as needed      Relevant Medications   fluticasone (FLONASE) 50 MCG/ACT nasal spray   promethazine-dextromethorphan (PROMETHAZINE-DM) 6.25-15 MG/5ML syrup   amoxicillin-clavulanate (AUGMENTIN) 500-125 MG tablet     Musculoskeletal and Integument   RA (rheumatoid arthritis) (HCC)    Meloxicam as needed        Other   Constipation - Primary    On daily Miralax GI ref to Dr Silverio Decamp was suggested         Meds ordered this encounter  Medications   promethazine-dextromethorphan (PROMETHAZINE-DM) 6.25-15 MG/5ML syrup    Sig: Take 5 mLs by mouth 4 (four) times daily as needed for cough.    Dispense:  240 mL    Refill:  0   amoxicillin-clavulanate (AUGMENTIN) 500-125 MG tablet    Sig: Take 1 tablet by mouth 3 (three) times daily.    Dispense:  30 tablet    Refill:  0      Follow-up: Return in about 3 months (around 09/09/2022) for a follow-up visit.  Walker Kehr, MD

## 2022-06-11 NOTE — Telephone Encounter (Signed)
Use over-the-counter cold medications, ie NyQuil and DayQuil if she was able to tolerate them in the past.  Please see one of [place] if not well.  Thanks

## 2022-06-11 NOTE — Telephone Encounter (Signed)
Notified pt w/ MD response. Pt states she is coughing up dark green stuff so she made appt this afternoon @ 4 w/ Dr. Carney Corners

## 2022-06-17 ENCOUNTER — Encounter: Payer: Self-pay | Admitting: Psychiatry

## 2022-06-17 ENCOUNTER — Ambulatory Visit (INDEPENDENT_AMBULATORY_CARE_PROVIDER_SITE_OTHER): Payer: Medicare Other | Admitting: Psychiatry

## 2022-06-17 DIAGNOSIS — F331 Major depressive disorder, recurrent, moderate: Secondary | ICD-10-CM

## 2022-06-17 DIAGNOSIS — F411 Generalized anxiety disorder: Secondary | ICD-10-CM

## 2022-06-17 DIAGNOSIS — F902 Attention-deficit hyperactivity disorder, combined type: Secondary | ICD-10-CM

## 2022-06-17 DIAGNOSIS — D508 Other iron deficiency anemias: Secondary | ICD-10-CM

## 2022-06-17 NOTE — Progress Notes (Signed)
Courtney [name] [id number] Jan 12, 1951 72 y.o.  Subjective:   Patient ID:  Courtney [name] is a 72 y.o. (DOB [birth year]) female.  Chief Complaint:  Chief Complaint  Patient presents with   Follow-up   Depression   Anxiety     Courtney [name] presents to the office today for follow-up of first visit 03/01/2020 referred by a friend Courtney [name].  Was prescribed Viibryd and Vyvanse was changed to Concerta 54 mg to try to reduce the amount of dry mouth.  05/02/2020 appointment with the following noted: Less dry mouth with Concerta but didn't seem to last beyond 3-4 PM. Viibryd 20 mg daily.  Hard to get enough calories at breakfast.  But has been taking both in the morning.  Some benefit with Viibryd. Is there something I can take besides a stimulant, bc fears it is stressing her body.  Dx college exhausted adrenal glands.  It helps.  Asked questions about alternatives to stimulants.  Reduced Concerta to reduce anxiety and see if can achieve, but she thinks it's worse in the evening. Anxiety is still a struggle.  Life crisis moment.  Chronic marital dissatisfaction worse now that both are retired.  Feels like she's in a prison.  Family notices she's stressed.  Has done therapy since age 34 yo.  Has done Hindu meditation without help.  Christian faith disciplines.  Grew up caretaking.  I'm done and I need to change.  Goal is calm down enough to function through the situation. Tingling foot and wants B12 testing. Plan: Switch Viibryd to 20 mg in evening meal to get better absorption.  06/18/2020 appointment with following noted: No difference in anxiety or mood with Viibryd 20 mg daily.  She doesn't want to increase it. Questions about Viibryd and Genesight testing.  Asks about differences between Adderall and MPH.   Missed for 3 days Concerta and took Adderall.    Tired by 4-5 PM and doesn't like that.    Plan: She wants to wean off the Viibryd because she does not feel it has been  helpful and she does not want to increase the dosage.  07/03/2020 phone call patient stating she wanted to continue Viibryd 20 mg daily.  She also requested refill of Concerta 54 mg  [id number] appointment with the following noted: Was told she couldn't take Viibryd with one of the pain meds and had TKR.  So stopped it.  Couldn't tolerate pain meds except tramadol.  Had more pain than expected.   Stopped Viibryd about 07/15/20.  She had taken tramadol with Viibryd in the past without a problem.   Noticing more pain after surgery in non-surgical places. Seems to be sensitive to getting lightheaded and confused with low blood sugar.   Poor attention and scattered since surgery.  Also more tired. Occ taken tramadol and otherwise just Tylenol. Impossible to tell the effect of the Viibryd given she hasn't been herself for months. Concerned about H's Courtney [name]'s memory who is also a patient here.  Wants him to get neuropsych testing. Plan because patient is med sensitive we will start very low-dose fluvoxamine 25 mg nightly Per her request continue Concerta 54 mg every morning  10/25/2020 appointment with the following noted: Several phone calls since being here.  The first led to increasing fluvoxamine to 1-1/2 of the 25 mg tablets due to lack of effect at 3 weeks. 09/21/2020 she called asking to stop Concerta which she did. She called again wanting to start Adderall.  Because she had taken  it in 2019 it was agreed that she could start Adderall 10 mg twice daily. Stress dealing with husband and whether to move or not.  [name] separate.  Anxiety is very high.  Hard to calm down around him. $ stress.  Explodes with anger at husband.  Doesn't think he can sell the house on his own. Never been this stressed and anxious and in this kind of circumstance before. Never got the Adderall DT need for PA. Only caffeine in the AM Plan: Rec increase Luvox from 37.5 mg daily to 50 mg HS for a week then increase to 75 mg  daily and possibly higher.  B12 level checked 496 and normal. Hold Adderall until the anxiety is under control.   12/03/2020 phone call from patient: Patient called stating fluvoxamine made her more anxious and high strung and she stopped it.  She wanted an earlier appointment which was not available at the time she was given the option to see a nurse practitioner but refused.  12/19/2020 appointment with the following noted: Life very stressful right now and not getting better.  B in law died and marriage falling apart. Separating.   More than I can handle including anxiety and depression. Plan: Rec trial beta blocker propranolol 10-30 mg  twice daily for anxiety Consider TCA bc Genesight test  B12 level checked 496 and normal. Hold Adderall until the anxiety is under control.   01/01/2021 phone call from patient with nurse as follows: She is taking up to 30 mg propranolol twice daily without sufficient benefit for her anxiety.  01/03/2021 appointment with the following noted: Rare Adderall.  Propranolol didn't help anxiety much at 30 mg BID without SE.[name]   Thinks she's gotten depressed which is unusual.  In a perfect storm with divorce and moving and financial stress.  Not functioning well.  Hard to make a decision.  Poor productivity.   Having a lot of pain, chronically and worse lately. Starting Lyrica today. Taking alprazolam about 0.5 mg daily. Plan: Yes imipramine 25 and increase to 75 mg HS and then check blood level  02/26/21 appt noted: 01/21/2021 serum imipramine and desipramine total was 65 at 75 mg daily. It has made a difference.  But has made so many changes and moving.  Stress goes to GI system.   Was in ER last night with GI px and [name] have ulcer.  Unable to eat without nausea.  Lump in the throat nausea. Mild taking the edge off.  Can still explode. Can't remember the SE at 100 mg daily. GI dominating with pain and nausea. Sleep varies from good or bothered by sickness. Usually  fine but sometimes can't turn her brain off.  Moves in 5 days. Plan: Continue imipramine and increase to 100 mg again as soon as nausea managed. Add risperidone 1 mg HS off label for nausea and anxiety.  03/28/2021 appt noted: She is not taking the risperidone.  Tried it for a couple of weeks but did not help the nausea.  Didn't notice it helping anxiety but had tremor.  Off for 2 weeks and tremor better. Still on imipramine 75 mg nightly.  She did increase but wasn't sure it was causing the tremor as instructed. This week to GI and work up ordered and doubled med dose. Not handling the transition well.  Very irritable with husband.  Such a sense of urgency.   Not often with Xanax.   Not on Adderall.   Plan: imipramine and increase to 100  mg nightly.  B12 level checked 496 and normal. Hold Adderall until the anxiety is under control.  Switch Xanax to Ativan 1 mg tid since she is not on Adderall.    05/02/2021 appt noted: Increased imipramine 100 mg HS. Tolerated the increase Spleen bleed. Yesterday decent energy compared to what she had.  Ativan made her sleepy but only took it once. Sleeping a lot DT spleen injury. Can't tell about depression bc of injury kept her in bed.  Has to move to feel normal. Plan: imipramine and increase to 150 mg nightly, based on prior level.  05/29/21 appt noted: SE dry mouth Increased imipramine 150 mg HS Seen improvement in mood.  Meeting irritating situations with less response.  Better self control.  Less fear and anxiety. Ativan 1 mg prn makes her too sleepy and tired. Occ taken 1/2 Adderall with some energy and motivation.   GI work up ongoing with gastric emptying yesterday.  It's delayed ongoing.  GI doctor has commented on her hostility.   Sleeping really well.   Worst dep and anxiety was when moving and now 50% better.  Need to have some goals and since moving into townhouse and unknown what is next.  [name] move out of town to be near the kids at part  of the year but it's too cold.  Big decisions are hard. Difficulty with back pain so hasn't unpacked.   More hopeful about her health. Seasonal depression Plan: Imipramine is helpful but increase could be more helpful based on level.  Disc pros and cons on increase.  She wants to increase it..  Consider lithium augmentation. Increase imipramine to 200 mg nightly, based on prior level Disc light therapy in detail and gave handout.    Adderall prn. Reduce Ativan 1/2 mg tid prn since she is tired from it and not on Adderall usually.    07/05/2021 phone call complaining of dry mouth with imipramine.  Frustrating because of reportedly low sodium and was told to drink less water. MD response:We increased the imipramine from 3 of the 50 mg tablets to 4 of the 50 mg tablets on February 1.  If she has seen additional improvement in her mood it is to her advantage to continue the current dosage.  If she has not seen additional improvement since increasing the dose she can drop the imipramine back to 3 tablets daily. In addition to that she should discuss the dry mouth problem with her pharmacist as the pharmacist [name] have some other recommendations to help manage it.  But the usual recommendation is to switch her toothpaste to Biotene toothpaste and use the Biotene mouthwash and Biotene spray to manage the dry mouth as best she can. Patient response:Notified patient. She said the issue is more complex than just dry mouth. She has been having GI issues and has been very sick, had a splenic bleed, is unable to get out of bed at times. Dr[facility] Derrek Monaco at [facility] is her GI doctor. Patient spoke to her after Tressia Miners talked to her today and is wanting her to stop the imipramine to eliminate that as a source of her issues.   MD response:Okay she can come off the imipramine better mood anxiety and irritability will get worse off of it because it helped her.  However we cannot start any new medicines until we see if her  physical symptoms are any different off the medicine.  She can reduce imipramine by 1 tablet every 3 to 4 days.  The dry mouth will not resolve until she is off the medicine.  The splenic bleed is not related to imipramine unless she has some bizarre sort of allergy.  08/01/21 appt noted: Martin Majestic totally off imipramine for a week and GI px not better and restarted imipramine to 100 mg HS DT chronic constipation and then this week increased to 150 just this week.   Therapy with Mateo Flow helping a lot. Overall depression and anxiety is better. Trying to find endo.  Medically cleared from splenic bleed but doesn't think it is fine. Episode of abd paine with result in bed 5 days but back out of bed and functioning again. Hasn't needed lorazepam lately. Recognizes still angry and intense too much .   Plan check level and add lithium 300  10/07/21 appt noted; Taboo about lithium and didn't take it. Irritability is still focused and directed at H and not sure how it would be wihtout him.  Trying to address this with therapy and faith and prayer.   Seeing Godfrey Pick.   Everything fuels my fire.  Erupting this is never gonna change rage.  Recognizes this part is her problem.  Gets upset with him not doing things she wants done. Body getting stronger since Xmas and more she can do the better her depression.   Still some stiffness with pain in spine and leg in AM and then gets better. Not seriously depressed if can move. Don't handle inactivity well.  Able to do more now though has helped. Can't control her reaction to Merry Proud though knows it is over the top since she retired. Intermittent nausea with activity causes her to go to bed.  Waves of pain with it.  Frustrated cause is unknown. Plan: Continue imipramine 150 mg HS for a week and level was WNL at 238. Augment with lithium CR 300 mg HS for anger and mood  01/22/22 appt noted: Currently take imipramine 100 mg HS and lithium CR 300 mg HS, Adderall 10  BID Reduced imipramine to help tremor and it is some better.  Occ tremor, positional.  Mood is a lot better with lithium.  Right away clearing of haze and calming down.  Better outlook and been like that since then. Questions about effects on organs.   Sleep well.   Not sig anxious now.  Occ moments of anxiety.   Asks if could stop imipramine bc benefit with lithium.  04/10/22 appt noted: Currently stopped imipramine 100 mg HS (2 days ago) and taking lithium CR 300 mg HS, Adderall 10 BID Good but have to stop the imipramine bc of the shaking.  She cut dose to 50 mg QOD. Tremor worse with R hand.  R handed.  Feels like lithium helped She wants to see what mood is like without imipramine.  Less tremor with less imipramine. Tremor is better with less right now. [name] need shoulder replacement DT pain.  Also needs spinal fusion DT dropped foot. Hard to talk about it bc so dreads it.   Irritability is there and when ignited is bad but working on it in therapy.    Been angry my whole life.  Back to trauma in childhood.  Overall though better with lithium.   Miralax helped GI problems but waves of ok and then GI px.   Needs Adderall daily. Takes Ativan just occ with stressful circumstances. Plan: on imipramine 100 mg HS for a week and level was WNL at 238. She stopped it this week and disc risk  of relapse of dep but she hopes lithium will keep dep at bay Continue lithium CR 300 mg HS bc helped anger and mood  06/17/22 appt noted: recently taking 1/2 lithium 300 and imipramine 50 mg HS for 4-5 days and tremor better with less lithium. CO tremor;   irritates her and makes her feel bad about herself.  Never really tried more 10 mg propranolol for tremor. More pain in shoulder.   Not sure if any change in explosive irritation at H.  Not sure if it is her H or bc of the relationship. Depression is not that bad in the last month and no worse with less imipramine.   Would like to put a spacer between  triggers and what she says to H .  Not good.   When in [place] 2 yr ago for 2 mos alone but still had underlying anger problems.  Past Psychiatric Medication Trials: Trintellix NR, sertraline?,  Viibryd 20, remote zoloft, Paxil, Cymbalta couple days ? Adverse reaction Imipramine 150 better Fluvoxamine SE anxiety but ? Adequacy of trial bc stress Fluvoxamine 100 in 2015 per Dr. [name] Lithium 300 helpful Gabapentin 1600 NR Lyrica  Risperidone 1 tremor Vyvanse, Adderall, concerta DC propranolol 10-30 mg  twice daily for anxiety bc not helpful, lorazepam History Genesight History of Dr. [name] and Pauline Good  Started therapy at 86 yo bc fear mother would die any minute bc she had cardiac px.  Review of Systems:  Review of Systems  Cardiovascular:  Negative for chest pain.  Gastrointestinal:  Positive for abdominal pain and nausea.  Musculoskeletal:  Positive for arthralgias and back pain.  Neurological:  Positive for dizziness, tremors and weakness.  Psychiatric/Behavioral:  Positive for dysphoric mood. Negative for agitation, confusion, decreased concentration and hallucinations. The patient is nervous/anxious.     Medications: I have reviewed the patient's current medications.  Current Outpatient Medications  Medication Sig Dispense Refill   albuterol (VENTOLIN HFA) 108 (90 Base) MCG/ACT inhaler Inhale 2 puffs into the lungs every 4 (four) hours as needed for wheezing or shortness of breath. 18 g 3   amoxicillin-clavulanate (AUGMENTIN) 500-125 MG tablet Take 1 tablet by mouth 3 (three) times daily. 30 tablet 0   amphetamine-dextroamphetamine (ADDERALL) 10 MG tablet Take 1 tablet (10 mg total) by mouth 2 (two) times daily. 180 tablet 0   cyclobenzaprine (FLEXERIL) 10 MG tablet Take 10 mg by mouth as needed.     fluticasone (FLONASE) 50 MCG/ACT nasal spray Place into both nostrils daily.     imipramine (TOFRANIL) 50 MG tablet TAKE 3 TABLETS(150 MG) BY MOUTH AT BEDTIME (Patient  taking differently: Take 50 mg by mouth. Taking 67m daily) 270 tablet 0   levothyroxine (SYNTHROID) 50 MCG tablet Take 50 mcg by mouth daily before breakfast.     lithium carbonate (LITHOBID) 300 MG CR tablet Take 1 tablet (300 mg total) by mouth at bedtime. (Patient taking differently: Take 150 mg by mouth at bedtime.) 90 tablet 0   LORazepam (ATIVAN) 1 MG tablet Take 1 tablet (1 mg total) by mouth every 8 (eight) hours. (Patient taking differently: Take 1 mg by mouth as needed.) 90 tablet 1   meloxicam (MOBIC) 7.5 MG tablet TAKE 1 TO 2 TABLETS(7.5 TO 15 MG) BY MOUTH DAILY AS NEEDED FOR PAIN 180 tablet 0   omega-3 acid ethyl esters (LOVAZA) 1 g capsule Take 2 capsules (2 g total) by mouth 2 (two) times daily. 120 capsule 11   pravastatin (PRAVACHOL) 20 MG tablet  Take one tablet by mouth 4 days per week (Patient taking differently: Take 20 mg by mouth daily. Take one tablet by mouth 1-2 days per week) 48 tablet 3   promethazine-dextromethorphan (PROMETHAZINE-DM) 6.25-15 MG/5ML syrup Take 5 mLs by mouth 4 (four) times daily as needed for cough. 240 mL 0   Testosterone 1.62 % GEL Place 5 mg onto the skin daily. 30 g 3   traMADol (ULTRAM) 50 MG tablet TAKE 1/2 TO 1 TABLET(25 TO 50 MG) BY MOUTH BACK EVERY 8 HOURS AS NEEDED FOR SEVERE PAIN 90 tablet 0   vitamin E 400 UNIT capsule Take 800 Units by mouth daily.      nitroGLYCERIN (NITROSTAT) 0.4 MG SL tablet Place 1 tablet (0.4 mg total) under the tongue every 5 (five) minutes as needed for chest pain. (Patient not taking: Reported on 08/13/2021) 25 tablet 2   No current facility-administered medications for this visit.    Medication Side Effects: Other: dry mouth  Allergies:  Allergies  Allergen Reactions   Clarithromycin Nausea Only   Covid-19 (Mrna) Vaccine Other (See Comments)    Acute Vasculitis; excessive bleeding  Other reaction(s): Other (See Comments) Acute Vasculitis; excessive bleeding    Other Other (See Comments)   Bactrim  [Sulfamethoxazole-Trimethoprim] Nausea And Vomiting   Morphine Nausea And Vomiting    Other reaction(s): Nausea/Vomiting   Morphine And Related Nausea And Vomiting   Azithromycin Nausea And Vomiting and Nausea Only   Ceclor [Cefaclor] Nausea And Vomiting    Can take Augmentin ok   Claritin [Loratadine]     REACTION: bruises   Codeine Nausea And Vomiting    Can take Tramadol ok   Cymbalta [Duloxetine Hcl]    Erythromycin Nausea And Vomiting    Other reaction(s): Nausea/Vomiting   Erythromycin Base Nausea And Vomiting   Levofloxacin Nausea Only    REACTION: nausea   Lipitor [Atorvastatin]     Other reaction(s): Myalgias (intolerance)   Nitrofurantoin Nausea Only   Oxycodone Other (See Comments)   Penicillins Nausea And Vomiting    Can take Augmentin   Propoxyphene Nausea Only    dizzy   Propoxyphene N-Acetaminophen Nausea Only    dizzy    Past Medical History:  Diagnosis Date   ADD (attention deficit disorder)    Anxiety    Celiac disease    possible vs IBS   Complication of anesthesia    Coronary artery disease    Depression    no bipolar per Dr. [name]   GI problem    Olevia Perches   Gluten intolerance    Hyperlipidemia    IBS (irritable bowel syndrome)    Osteoarthritis    Osteoporosis    Pneumonia    PONV (postoperative nausea and vomiting)    Rheumatoid arthritis (HCC)    Seasonal allergies 01/08/2012   hx. of multiple bronchitis related to this.   Shoulder pain, right     Family History  Problem Relation Age of Onset   Heart disease Mother    Heart attack Mother    Prostate cancer Father    Heart disease Sister    Heart disease Maternal Grandmother    Breast cancer Paternal Grandmother    Colon cancer Paternal Grandfather    Coronary artery disease Other        FH Female 1st degree relative <60   ADD / ADHD Other    Other Daughter        gluten intolerance    Social History   Socioeconomic  History   Marital status: Married    Spouse name: Not  on file   Number of children: 2   Years of education: Not on file   Highest education level: Not on file  Occupational History   Occupation: Futures trader  Tobacco Use   Smoking status: Never   Smokeless tobacco: Never   Tobacco comments:    only Art gallery manager Use: Never used  Substance and Sexual Activity   Alcohol use: Yes    Comment: 0-1 per day   Drug use: No   Sexual activity: Yes  Other Topics Concern   Not on file  Social History Narrative   Married for last 42 years.Lives with husband.Retired Insurance account manager.Originally from [place].   Social Determinants of Health   Financial Resource Strain: Low Risk  (02/27/2022)   Overall Financial Resource Strain (CARDIA)    Difficulty of Paying Living Expenses: Not hard at all  Food Insecurity: No Food Insecurity (02/27/2022)   Hunger Vital Sign    Worried About Running Out of Food in the Last Year: Never true    Ran Out of Food in the Last Year: Never true  Transportation Needs: No Transportation Needs (02/27/2022)   PRAPARE - Hydrologist (Medical): No    Lack of Transportation (Non-Medical): No  Physical Activity: Sufficiently Active (02/27/2022)   Exercise Vital Sign    Days of Exercise per Week: 5 days    Minutes of Exercise per Session: 30 min  Stress: No Stress Concern Present (02/27/2022)   [place]    Feeling of Stress : Not at all  Social Connections: [place] (02/27/2022)   Social Connection and Isolation Panel [NHANES]    Frequency of Communication with Friends and Family: More than three times a week    Frequency of Social Gatherings with Friends and Family: More than three times a week    Attends Religious Services: 1 to 4 times per year    Active Member of [employer] or Organizations: Yes    Attends Archivist Meetings: 1 to 4 times per year    Marital Status: Married   Human resources officer Violence: Not At Risk (02/27/2022)   Humiliation, Afraid, Rape, and Kick questionnaire    Fear of Current or Ex-Partner: No    Emotionally Abused: No    Physically Abused: No    Sexually Abused: No    Past Medical History, Surgical history, Social history, and Family history were reviewed and updated as appropriate.   Please see review of systems for further details on the patient's review from today.   Objective:   Physical Exam:  LMP 01/08/1999   Physical Exam Constitutional:      General: She is not in acute distress. Musculoskeletal:        General: No deformity.  Neurological:     Mental Status: She is alert and oriented to person, place, and time.     Coordination: Coordination normal.  Psychiatric:        Attention and Perception: Attention and perception normal. She does not perceive auditory or visual hallucinations.        Mood and Affect: Mood is not anxious or depressed. Affect is not labile, blunt or angry.        Speech: Speech normal. Speech is not slurred.        Behavior: Behavior is not hyperactive.  Thought Content: Thought content normal. Thought content is not paranoid or delusional. Thought content does not include homicidal or suicidal ideation. Thought content does not include suicidal plan.        Cognition and Memory: Cognition and memory normal.        Judgment: Judgment normal.     Comments: Insight intact Hyperactive style less intense and less depressed Minimal tremor today     Lab Review:     Component Value Date/Time   NA 130 (L) 04/17/2021 1825   NA 134 06/14/2018 1444   K 3.8 04/17/2021 1825   CL 97 (L) 04/17/2021 1825   CO2 26 04/17/2021 1825   GLUCOSE 92 04/17/2021 1825   BUN 15 04/17/2021 1825   BUN 10 06/14/2018 1444   CREATININE 1.00 04/17/2021 1825   CREATININE 0.76 04/10/2020 1431   CALCIUM 9.0 04/17/2021 1825   PROT 6.8 02/25/2021 1614   PROT 6.5 12/19/2020 0754   ALBUMIN 4.1 02/25/2021 1614    ALBUMIN 4.5 12/19/2020 0754   AST 16 02/25/2021 1614   ALT 12 02/25/2021 1614   ALKPHOS 47 02/25/2021 1614   BILITOT 0.3 02/25/2021 1614   BILITOT 0.4 12/19/2020 0754   GFRNONAA 60 (L) 04/17/2021 1825   GFRNONAA 79 04/10/2020 1431   GFRAA 91 04/10/2020 1431       Component Value Date/Time   WBC 5.0 04/17/2021 1825   RBC 3.53 (L) 04/17/2021 1825   HGB 11.1 (L) 04/17/2021 1825   HCT 33.4 (L) 04/17/2021 1825   PLT 318 04/17/2021 1825   MCV 94.6 04/17/2021 1825   MCH 31.4 04/17/2021 1825   MCHC 33.2 04/17/2021 1825   RDW 12.5 04/17/2021 1825   LYMPHSABS 2.0 06/14/2018 0005   MONOABS 0.7 06/14/2018 0005   EOSABS 0.2 06/14/2018 0005   BASOSABS 0.0 06/14/2018 0005    No results found for: "POCLITH", "LITHIUM"   No results found for: "PHENYTOIN", "PHENOBARB", "VALPROATE", "CBMZ"   06/04/20 B12 normal at 496  01/21/2021 imipramine level 25, desipramine level 40 equals total 65 which is low on 75 mg daily. 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 They have one copy of the C allele and one copy of the G allele. This genotype suggests a normal response to certain ADHD medications.  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 This genotype suggests a lower risk of serious hypersensitivity reactions, including Stevens-Johnson syndrome (SJS), toxic epidermal necrolysis (TEN), maculopapular eruptions, and Drug Reaction with Eosinophilia and Systemic Symptoms when taking certain mood stabilizers.  HLA-B*1502Lower Risk Not Present This patient does not carry the HLA-B*1502 allele or a closely related *15 allele. Absence of HLA-B*1502 and the closely related *15 alleles suggests lower risk of serious  dermatologic reactions including toxic epidermal necrolysis (TEN) and Stevens-Johnson syndrome (SJS) when taking certain mood stabilizers.  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 They carry two copies of the G allele. This genotype has been associated with an increased risk of adverse drug reactions with certain selective serotonin reuptake inhibitors.  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 The short promoter allele is reported to decrease expression of the serotonin transporter compared  to the homozygous long promoter allele. The patient [name] have a moderately decreased likelihood of response to certain selective serotonin reuptake inhibitors due to the presence of the short form of the gene.  Pharmacokinetic Genes PK CES1A1Extensive (Normal) Metabolizer GLY/GLY CES1A1 - Gly allele enzyme activity: Normal CES1A1 - Gly allele enzyme activity: Normal  This genotype is most consistent with the extensive (normal) metabolizer phenotype.  The patient is expected to have normal enzyme activity.  CYP1A2Extensive (Normal) Metabolizer *1/*1 This genotype is most consistent with the extensive (normal) metabolizer phenotype.  CYP2B6Poor Metabolizer *6/*6 CYP2B6*6 allele enzyme activity: Reduced CYP2B6*6 allele enzyme activity: Reduced  This genotype is most consistent with the poor metabolizer phenotype. This patient [name] have reduced enzyme activity as compared to individuals with the normal phenotype.  CYP2C19Extensive (Normal) Metabolizer *1/*1 CYP2C19*1 allele enzyme activity: Normal CYP2C19*1 allele enzyme activity: Normal  This genotype is most consistent with the extensive (normal) metabolizer phenotype.  CYP2C9Intermediate Metabolizer *1/*2 CYP2C9*1 allele enzyme activity:  Normal CYP2C9*2 allele enzyme activity: Reduced  This genotype is most consistent with the intermediate metabolizer phenotype. This patient [name] have reduced enzyme activity as compared to individuals with the normal phenotype.  CYP2D6Extensive (Normal) Metabolizer *1/*41 CYP2D6*1 allele enzyme activity: Normal CYP2D6*41 allele enzyme activity: Reduced  This genotype is most consistent with the extensive (normal) metabolizer phenotype.  CYP3A4Poor Metabolizer *22/*22 CYP3A4*22 allele enzyme activity: Reduced CYP3A4*22 allele enzyme activity: Reduced  This genotype is most consistent with the poor metabolizer phenotype. This patient [name] have reduced enzyme activity as compared to individuals with the normal phenotype.  UGT1A4Ultrarapid Metabolizer *1/*3 UGT1A4*1 allele enzyme activity: Normal UGT1A4*3 allele enzyme activity: Increased  This genotype is most consistent with the ultrarapid metabolizer phenotype. This patient [name] have increased enzyme activity as compared to individuals with the normal phenotype.  UGT2B15Intermediate Metabolizer *2/*2 UGT2B15*2 allele enzyme activity: Reduced UGT2B15*2 allele enzyme activity: Reduced  This genotype is most consistent with the intermediate metabolizer phenotype. This patient [name] have reduced enzyme activity as compared to individuals with the normal phenotype. .res Assessment: Plan:    Vonciel was seen today for follow-up, depression and anxiety.  Diagnoses and all orders for this visit:  Major depressive disorder, recurrent episode, moderate (HCC)  Generalized anxiety disorder  Attention deficit hyperactivity disorder (ADHD), combined type  Other iron deficiency anemia    Greater than 50% of 30 min face to face time with patient was spent on counseling and coordination of care. We discussed  Genesight testing and her desire to take something for anxiety that is in the "Use as Directed" column.   Overall depression is  better and anxiety was better too but not gone and worse without lithium.   Overall more irritable than depressed. Rec she not change meds on her own.  This has been an issue  Chose TCA bc Genesight test;  disc SE in detail incl cardiac, etc on imipramine 100 mg HS for a week and level was WNL at 238. She stopped it then restarted at 50 mg HS. disc risk of relapse of dep but she hopes lithium will keep dep at bay  Continue lithium CR 300 mg HS bc helped anger and mood.  Will not likely get enough benefit on 150 mg daily so rec tx of tremor.  She has not had adequate trial of propranolol. Try propranolol 2 or 3 tablets twice daily as needed for tremor Continue lithium 300 mg daily for mood and if tremor is managed try 1 and 1/2 of the lithium  tablets for 2 weeks to see if mood is even better. Disc fear and stigma around lithium.  She agrees   B12 level checked 496 and normal.  Disc light therapy in detail and gave handout.     Adderall prn. Says 5 mg tablets less effective than 1/2 of a 10 mg tablet.  Taking and more productive and helps anxiety and function.  Not currently using , Ativan 1/2 mg tid prn since she is tired from it and not on Adderall usually.    Consider alternatives for anger outbursts, impulsivity like lithium, VPA, atypical.  Constipation management 1.  Lots of water 2.  Powdered fiber supplement such as MiraLAX, Citrucel, etc. preferably with a meal 3.  2 stool softeners a day 4.  Milk of magnesia or magnesium tablets if needed  Supportive therapy around marital crisis and other stressors.  She needs some goals.  Needs to get out of the house chronically.  Started therapy with Godfrey Pick.  FU 8 weeks  Lynder Parents, MD, DFAPA   Please see After Visit Summary for patient specific instructions.  Future Appointments  Date Time Provider [place]  07/14/2022  9:00 AM Cottle, Billey Co., MD [facility] None  08/13/2022  9:00 AM Cottle, Billey Co., MD  [facility] None  09/10/2022  9:00 AM Cottle, Billey Co., MD [facility] None     No orders of the defined types were placed in this encounter.     -------------------------------

## 2022-06-17 NOTE — Patient Instructions (Addendum)
Try propranolol 2 or 3 tablets twice daily as needed for tremor Continue lithium 300 mg daily for mood and if tremor is managed try 1 and 1/2 of the lithium tablets for 2 weeks to see if mood is even better.

## 2022-06-22 ENCOUNTER — Encounter: Payer: Self-pay | Admitting: Internal Medicine

## 2022-06-22 NOTE — Assessment & Plan Note (Signed)
Prescribed Augmentin , Promethazine DM for cough as needed

## 2022-06-22 NOTE — Assessment & Plan Note (Signed)
Meloxicam as needed

## 2022-07-14 ENCOUNTER — Ambulatory Visit: Payer: Medicare Other | Admitting: Psychiatry

## 2022-07-14 ENCOUNTER — Ambulatory Visit (INDEPENDENT_AMBULATORY_CARE_PROVIDER_SITE_OTHER): Payer: Medicare Other | Admitting: Psychiatry

## 2022-07-14 ENCOUNTER — Encounter: Payer: Self-pay | Admitting: Psychiatry

## 2022-07-14 DIAGNOSIS — D508 Other iron deficiency anemias: Secondary | ICD-10-CM | POA: Diagnosis not present

## 2022-07-14 DIAGNOSIS — F411 Generalized anxiety disorder: Secondary | ICD-10-CM | POA: Diagnosis not present

## 2022-07-14 DIAGNOSIS — F902 Attention-deficit hyperactivity disorder, combined type: Secondary | ICD-10-CM

## 2022-07-14 DIAGNOSIS — F331 Major depressive disorder, recurrent, moderate: Secondary | ICD-10-CM

## 2022-07-14 MED ORDER — LITHIUM CARBONATE ER 300 MG PO TBCR: 600.0000 mg | EXTENDED_RELEASE_TABLET | Freq: Every evening | ORAL | 0 refills | Status: DC

## 2022-07-14 NOTE — Patient Instructions (Signed)
Vitman B6 = 250 mg twice daily

## 2022-07-14 NOTE — Progress Notes (Signed)
Samala [name] [id number] Jan 12, 1951 72 y.o.  Subjective:   Patient ID:  Courtney [name] is a 72 y.o. (DOB [birth year]) female.  Chief Complaint:  Chief Complaint  Patient presents with   Follow-up   Depression   Anxiety     Courtney [name] presents to the office today for follow-up of first visit 03/01/2020 referred by a friend [name].  Was prescribed Viibryd and Vyvanse was changed to Concerta 54 mg to try to reduce the amount of dry mouth.  05/02/2020 appointment with the following noted: Less dry mouth with Concerta but didn't seem to last beyond 3-4 PM. Viibryd 20 mg daily.  Hard to get enough calories at breakfast.  But has been taking both in the morning.  Some benefit with Viibryd. Is there something I can take besides a stimulant, bc fears it is stressing her body.  Dx college exhausted adrenal glands.  It helps.  Asked questions about alternatives to stimulants.  Reduced Concerta to reduce anxiety and see if can achieve, but she thinks it's worse in the evening. Anxiety is still a struggle.  Life crisis moment.  Chronic marital dissatisfaction worse now that both are retired.  Feels like she's in a prison.  Family notices she's stressed.  Has done therapy since age 34 yo.  Has done Hindu meditation without help.  Christian faith disciplines.  Grew up caretaking.  I'm done and I need to change.  Goal is calm down enough to function through the situation. Tingling foot and wants B12 testing. Plan: Switch Viibryd to 20 mg in evening meal to get better absorption.  06/18/2020 appointment with following noted: No difference in anxiety or mood with Viibryd 20 mg daily.  She doesn't want to increase it. Questions about Viibryd and Genesight testing.  Asks about differences between Adderall and MPH.   Missed for 3 days Concerta and took Adderall.    Tired by 4-5 PM and doesn't like that.    Plan: She wants to wean off the Viibryd because she does not feel it has been  helpful and she does not want to increase the dosage.  07/03/2020 phone call patient stating she wanted to continue Viibryd 20 mg daily.  She also requested refill of Concerta 54 mg  [id number] appointment with the following noted: Was told she couldn't take Viibryd with one of the pain meds and had TKR.  So stopped it.  Couldn't tolerate pain meds except tramadol.  Had more pain than expected.   Stopped Viibryd about 07/15/20.  She had taken tramadol with Viibryd in the past without a problem.   Noticing more pain after surgery in non-surgical places. Seems to be sensitive to getting lightheaded and confused with low blood sugar.   Poor attention and scattered since surgery.  Also more tired. Occ taken tramadol and otherwise just Tylenol. Impossible to tell the effect of the Viibryd given she hasn't been herself for months. Concerned about Courtney [name]'s memory who is also a patient here.  Wants him to get neuropsych testing. Plan because patient is med sensitive we will start very low-dose fluvoxamine 25 mg nightly Per her request continue Concerta 54 mg every morning  10/25/2020 appointment with the following noted: Several phone calls since being here.  The first led to increasing fluvoxamine to 1-1/2 of the 25 mg tablets due to lack of effect at 3 weeks. 09/21/2020 she called asking to stop Concerta which she did. She called again wanting to start Adderall.  Because she had taken  it in 2019 it was agreed that she could start Adderall 10 mg twice daily. Stress dealing with husband and whether to move or not.  May separate.  Anxiety is very high.  Hard to calm down around him. $ stress.  Explodes with anger at husband.  Doesn't think he can sell the house on his own. Never been this stressed and anxious and in this kind of circumstance before. Never got the Adderall DT need for PA. Only caffeine in the AM Plan: Rec increase Luvox from 37.5 mg daily to 50 mg HS for a week then increase to 75 mg  daily and possibly higher.  B12 level checked 496 and normal. Hold Adderall until the anxiety is under control.   12/03/2020 phone call from patient: Patient called stating fluvoxamine made her more anxious and high strung and she stopped it.  She wanted an earlier appointment which was not available at the time she was given the option to see a nurse practitioner but refused.  12/19/2020 appointment with the following noted: Life very stressful right now and not getting better.  B in law died and marriage falling apart. Separating.   More than I can handle including anxiety and depression. Plan: Rec trial beta blocker propranolol 10-30 mg  twice daily for anxiety Consider TCA bc Genesight test  B12 level checked 496 and normal. Hold Adderall until the anxiety is under control.   01/01/2021 phone call from patient with nurse as follows: She is taking up to 30 mg propranolol twice daily without sufficient benefit for her anxiety.  01/03/2021 appointment with the following noted: Rare Adderall.  Propranolol didn't help anxiety much at 30 mg BID without SE.[name]   Thinks she's gotten depressed which is unusual.  In a perfect storm with divorce and moving and financial stress.  Not functioning well.  Hard to make a decision.  Poor productivity.   Having a lot of pain, chronically and worse lately. Starting Lyrica today. Taking alprazolam about 0.5 mg daily. Plan: Yes imipramine 25 and increase to 75 mg HS and then check blood level  02/26/21 appt noted: 01/21/2021 serum imipramine and desipramine total was 65 at 75 mg daily. It has made a difference.  But has made so many changes and moving.  Stress goes to GI system.   Was in ER last night with GI px and may have ulcer.  Unable to eat without nausea.  Lump in the throat nausea. Mild taking the edge off.  Can still explode. Can't remember the SE at 100 mg daily. GI dominating with pain and nausea. Sleep varies from good or bothered by sickness. Usually  fine but sometimes can't turn her brain off.  Moves in 5 days. Plan: Continue imipramine and increase to 100 mg again as soon as nausea managed. Add risperidone 1 mg HS off label for nausea and anxiety.  03/28/2021 appt noted: She is not taking the risperidone.  Tried it for a couple of weeks but did not help the nausea.  Didn't notice it helping anxiety but had tremor.  Off for 2 weeks and tremor better. Still on imipramine 75 mg nightly.  She did increase but wasn't sure it was causing the tremor as instructed. This week to GI and work up ordered and doubled med dose. Not handling the transition well.  Very irritable with husband.  Such a sense of urgency.   Not often with Xanax.   Not on Adderall.   Plan: imipramine and increase to 100  mg nightly.  B12 level checked 496 and normal. Hold Adderall until the anxiety is under control.  Switch Xanax to Ativan 1 mg tid since she is not on Adderall.    05/02/2021 appt noted: Increased imipramine 100 mg HS. Tolerated the increase Spleen bleed. Yesterday decent energy compared to what she had.  Ativan made her sleepy but only took it once. Sleeping a lot DT spleen injury. Can't tell about depression bc of injury kept her in bed.  Has to move to feel normal. Plan: imipramine and increase to 150 mg nightly, based on prior level.  05/29/21 appt noted: SE dry mouth Increased imipramine 150 mg HS Seen improvement in mood.  Meeting irritating situations with less response.  Better self control.  Less fear and anxiety. Ativan 1 mg prn makes her too sleepy and tired. Occ taken 1/2 Adderall with some energy and motivation.   GI work up ongoing with gastric emptying yesterday.  It's delayed ongoing.  GI doctor has commented on her hostility.   Sleeping really well.   Worst dep and anxiety was when moving and now 50% better.  Need to have some goals and since moving into townhouse and unknown what is next.  May move out of town to be near the kids at part  of the year but it's too cold.  Big decisions are hard. Difficulty with back pain so hasn't unpacked.   More hopeful about her health. Seasonal depression Plan: Imipramine is helpful but increase could be more helpful based on level.  Disc pros and cons on increase.  She wants to increase it..  Consider lithium augmentation. Increase imipramine to 200 mg nightly, based on prior level Disc light therapy in detail and gave handout.    Adderall prn. Reduce Ativan 1/2 mg tid prn since she is tired from it and not on Adderall usually.    07/05/2021 phone call complaining of dry mouth with imipramine.  Frustrating because of reportedly low sodium and was told to drink less water. MD response:We increased the imipramine from 3 of the 50 mg tablets to 4 of the 50 mg tablets on February 1.  If she has seen additional improvement in her mood it is to her advantage to continue the current dosage.  If she has not seen additional improvement since increasing the dose she can drop the imipramine back to 3 tablets daily. In addition to that she should discuss the dry mouth problem with her pharmacist as the pharmacist may have some other recommendations to help manage it.  But the usual recommendation is to switch her toothpaste to Biotene toothpaste and use the Biotene mouthwash and Biotene spray to manage the dry mouth as best she can. Patient response:Notified patient. She said the issue is more complex than just dry mouth. She has been having GI issues and has been very sick, had a splenic bleed, is unable to get out of bed at times. Dr[facility] Derrek Monaco at [facility] is her GI doctor. Patient spoke to her after Tressia Miners talked to her today and is wanting her to stop the imipramine to eliminate that as a source of her issues.   MD response:Okay she can come off the imipramine better mood anxiety and irritability will get worse off of it because it helped her.  However we cannot start any new medicines until we see if her  physical symptoms are any different off the medicine.  She can reduce imipramine by 1 tablet every 3 to 4 days.  The dry mouth will not resolve until she is off the medicine.  The splenic bleed is not related to imipramine unless she has some bizarre sort of allergy.  08/01/21 appt noted: Martin Majestic totally off imipramine for a week and GI px not better and restarted imipramine to 100 mg HS DT chronic constipation and then this week increased to 150 just this week.   Therapy with Mateo Flow helping a lot. Overall depression and anxiety is better. Trying to find endo.  Medically cleared from splenic bleed but doesn't think it is fine. Episode of abd paine with result in bed 5 days but back out of bed and functioning again. Hasn't needed lorazepam lately. Recognizes still angry and intense too much .   Plan check level and add lithium 300  10/07/21 appt noted; Taboo about lithium and didn't take it. Irritability is still focused and directed at H and not sure how it would be wihtout him.  Trying to address this with therapy and faith and prayer.   Seeing Godfrey Pick.   Everything fuels my fire.  Erupting this is never gonna change rage.  Recognizes this part is her problem.  Gets upset with him not doing things she wants done. Body getting stronger since Xmas and more she can do the better her depression.   Still some stiffness with pain in spine and leg in AM and then gets better. Not seriously depressed if can move. Don't handle inactivity well.  Able to do more now though has helped. Can't control her reaction to Merry Proud though knows it is over the top since she retired. Intermittent nausea with activity causes her to go to bed.  Waves of pain with it.  Frustrated cause is unknown. Plan: Continue imipramine 150 mg HS for a week and level was WNL at 238. Augment with lithium CR 300 mg HS for anger and mood  01/22/22 appt noted: Currently take imipramine 100 mg HS and lithium CR 300 mg HS, Adderall 10  BID Reduced imipramine to help tremor and it is some better.  Occ tremor, positional.  Mood is a lot better with lithium.  Right away clearing of haze and calming down.  Better outlook and been like that since then. Questions about effects on organs.   Sleep well.   Not sig anxious now.  Occ moments of anxiety.   Asks if could stop imipramine bc benefit with lithium.  04/10/22 appt noted: Currently stopped imipramine 100 mg HS (2 days ago) and taking lithium CR 300 mg HS, Adderall 10 BID Good but have to stop the imipramine bc of the shaking.  She cut dose to 50 mg QOD. Tremor worse with R hand.  R handed.  Feels like lithium helped She wants to see what mood is like without imipramine.  Less tremor with less imipramine. Tremor is better with less right now. May need shoulder replacement DT pain.  Also needs spinal fusion DT dropped foot. Hard to talk about it bc so dreads it.   Irritability is there and when ignited is bad but working on it in therapy.    Been angry my whole life.  Back to trauma in childhood.  Overall though better with lithium.   Miralax helped GI problems but waves of ok and then GI px.   Needs Adderall daily. Takes Ativan just occ with stressful circumstances. Plan: on imipramine 100 mg HS for a week and level was WNL at 238. She stopped it this week and disc risk  of relapse of dep but she hopes lithium will keep dep at bay Continue lithium CR 300 mg HS bc helped anger and mood  06/17/22 appt noted: recently taking 1/2 lithium 300 and imipramine 50 mg HS for 4-5 days and tremor better with less lithium. CO tremor;   irritates her and makes her feel bad about herself.  Never really tried more 10 mg propranolol for tremor. More pain in shoulder.   Not sure if any change in explosive irritation at H.  Not sure if it is her H or bc of the relationship. Depression is not that bad in the last month and no worse with less imipramine.   Would like to put a spacer between  triggers and what she says to H .  Not good.   When in [place] 2 yr ago for 2 mos alone but still had underlying anger problems. Plan: no med changes  07/14/22 appt noted: Increased lithium back to 1 and 1/2 tablet for a month.   Till has it in R hand esp.  Uses propranolol sometimes. Only tried it 4 times. An on impramine 50 mg HS imipramine 100 mg HS for a week and level was WNL at 238. Mood not bad at all  Irritability ?  Better with more lithium.   Past Psychiatric Medication Trials: Trintellix NR, sertraline?,  Viibryd 20, remote zoloft, Paxil, Cymbalta couple days ? Adverse reaction Imipramine 150 better imipramine 100 mg HS for a week and level was WNL at 238. Fluvoxamine SE anxiety but ? Adequacy of trial bc stress Fluvoxamine 100 in 2015 per Dr. [name] Lithium 300 helpful  Gabapentin 1600 NR Lyrica  Risperidone 1 tremor Vyvanse, Adderall, concerta DC propranolol 10-30 mg  twice daily for anxiety bc not helpful, lorazepam History Genesight History of Dr. [name] and Pauline Good  Started therapy at 62 yo bc fear mother would die any minute bc she had cardiac px.  Review of Systems:  Review of Systems  Cardiovascular:  Negative for chest pain.  Gastrointestinal:  Positive for abdominal pain and nausea.  Musculoskeletal:  Positive for arthralgias and back pain.  Neurological:  Positive for dizziness and tremors. Negative for weakness.  Psychiatric/Behavioral:  Positive for dysphoric mood. Negative for agitation, confusion, decreased concentration and hallucinations. The patient is nervous/anxious.     Medications: I have reviewed the patient's current medications.  Current Outpatient Medications  Medication Sig Dispense Refill   albuterol (VENTOLIN HFA) 108 (90 Base) MCG/ACT inhaler Inhale 2 puffs into the lungs every 4 (four) hours as needed for wheezing or shortness of breath. 18 g 3   amoxicillin-clavulanate (AUGMENTIN) 500-125 MG tablet Take 1 tablet by mouth 3  (three) times daily. 30 tablet 0   cyclobenzaprine (FLEXERIL) 10 MG tablet Take 10 mg by mouth as needed.     fluticasone (FLONASE) 50 MCG/ACT nasal spray Place into both nostrils daily.     imipramine (TOFRANIL) 50 MG tablet TAKE 3 TABLETS(150 MG) BY MOUTH AT BEDTIME (Patient taking differently: Take 50 mg by mouth. Taking 50mg  daily) 270 tablet 0   levothyroxine (SYNTHROID) 50 MCG tablet Take 50 mcg by mouth daily before breakfast.     lithium carbonate (LITHOBID) 300 MG ER tablet Take 2 tablets (600 mg total) by mouth at bedtime. 180 tablet 0   LORazepam (ATIVAN) 1 MG tablet Take 1 tablet (1 mg total) by mouth every 8 (eight) hours. (Patient taking differently: Take 1 mg by mouth as needed.) 90 tablet 1   meloxicam (  MOBIC) 7.5 MG tablet TAKE 1 TO 2 TABLETS(7.5 TO 15 MG) BY MOUTH DAILY AS NEEDED FOR PAIN 180 tablet 0   omega-3 acid ethyl esters (LOVAZA) 1 g capsule Take 2 capsules (2 g total) by mouth 2 (two) times daily. 120 capsule 11   pravastatin (PRAVACHOL) 20 MG tablet Take one tablet by mouth 4 days per week (Patient taking differently: Take 20 mg by mouth daily. Take one tablet by mouth 1-2 days per week) 48 tablet 3   promethazine-dextromethorphan (PROMETHAZINE-DM) 6.25-15 MG/5ML syrup Take 5 mLs by mouth 4 (four) times daily as needed for cough. 240 mL 0   Testosterone 1.62 % GEL Place 5 mg onto the skin daily. 30 g 3   traMADol (ULTRAM) 50 MG tablet TAKE 1/2 TO 1 TABLET(25 TO 50 MG) BY MOUTH BACK EVERY 8 HOURS AS NEEDED FOR SEVERE PAIN 90 tablet 0   vitamin E 400 UNIT capsule Take 800 Units by mouth daily.      amphetamine-dextroamphetamine (ADDERALL) 10 MG tablet Take 1 tablet (10 mg total) by mouth 2 (two) times daily. (Patient not taking: Reported on 07/14/2022) 180 tablet 0   nitroGLYCERIN (NITROSTAT) 0.4 MG SL tablet Place 1 tablet (0.4 mg total) under the tongue every 5 (five) minutes as needed for chest pain. (Patient not taking: Reported on 08/13/2021) 25 tablet 2   No current  facility-administered medications for this visit.    Medication Side Effects: Other: dry mouth  Allergies:  Allergies  Allergen Reactions   Clarithromycin Nausea Only   Covid-19 (Mrna) Vaccine Other (See Comments)    Acute Vasculitis; excessive bleeding  Other reaction(s): Other (See Comments) Acute Vasculitis; excessive bleeding    Other Other (See Comments)   Bactrim [Sulfamethoxazole-Trimethoprim] Nausea And Vomiting   Morphine Nausea And Vomiting    Other reaction(s): Nausea/Vomiting   Morphine And Related Nausea And Vomiting   Azithromycin Nausea And Vomiting and Nausea Only   Ceclor [Cefaclor] Nausea And Vomiting    Can take Augmentin ok   Claritin [Loratadine]     REACTION: bruises   Codeine Nausea And Vomiting    Can take Tramadol ok   Cymbalta [Duloxetine Hcl]    Erythromycin Nausea And Vomiting    Other reaction(s): Nausea/Vomiting   Erythromycin Base Nausea And Vomiting   Levofloxacin Nausea Only    REACTION: nausea   Lipitor [Atorvastatin]     Other reaction(s): Myalgias (intolerance)   Nitrofurantoin Nausea Only   Oxycodone Other (See Comments)   Penicillins Nausea And Vomiting    Can take Augmentin   Propoxyphene Nausea Only    dizzy   Propoxyphene N-Acetaminophen Nausea Only    dizzy    Past Medical History:  Diagnosis Date   ADD (attention deficit disorder)    Anxiety    Celiac disease    possible vs IBS   Complication of anesthesia    Coronary artery disease    Depression    no bipolar per Dr. [name]   GI problem    Olevia Perches   Gluten intolerance    Hyperlipidemia    IBS (irritable bowel syndrome)    Osteoarthritis    Osteoporosis    Pneumonia    PONV (postoperative nausea and vomiting)    Rheumatoid arthritis (HCC)    Seasonal allergies 01/08/2012   hx. of multiple bronchitis related to this.   Shoulder pain, right     Family History  Problem Relation Age of Onset   Heart disease Mother    Heart attack  Mother    Prostate cancer  Father    Heart disease Sister    Heart disease Maternal Grandmother    Breast cancer Paternal Grandmother    Colon cancer Paternal Grandfather    Coronary artery disease Other        FH Female 1st degree relative <60   ADD / ADHD Other    Other Daughter        gluten intolerance    Social History   Socioeconomic History   Marital status: Married    Spouse name: Not on file   Number of children: 2   Years of education: Not on file   Highest education level: Not on file  Occupational History   Occupation: Futures trader  Tobacco Use   Smoking status: Never   Smokeless tobacco: Never   Tobacco comments:    only Art gallery manager Use: Never used  Substance and Sexual Activity   Alcohol use: Yes    Comment: 0-1 per day   Drug use: No   Sexual activity: Yes  Other Topics Concern   Not on file  Social History Narrative   Married for last 42 years.Lives with husband.Retired Insurance account manager.Originally from [place].   Social Determinants of Health   Financial Resource Strain: Low Risk  (02/27/2022)   Overall Financial Resource Strain (CARDIA)    Difficulty of Paying Living Expenses: Not hard at all  Food Insecurity: No Food Insecurity (02/27/2022)   Hunger Vital Sign    Worried About Running Out of Food in the Last Year: Never true    Ran Out of Food in the Last Year: Never true  Transportation Needs: No Transportation Needs (02/27/2022)   PRAPARE - Hydrologist (Medical): No    Lack of Transportation (Non-Medical): No  Physical Activity: Sufficiently Active (02/27/2022)   Exercise Vital Sign    Days of Exercise per Week: 5 days    Minutes of Exercise per Session: 30 min  Stress: No Stress Concern Present (02/27/2022)   [place]    Feeling of Stress : Not at all  Social Connections: [place] (02/27/2022)   Social Connection and Isolation  Panel [NHANES]    Frequency of Communication with Friends and Family: More than three times a week    Frequency of Social Gatherings with Friends and Family: More than three times a week    Attends Religious Services: 1 to 4 times per year    Active Member of [employer] or Organizations: Yes    Attends Archivist Meetings: 1 to 4 times per year    Marital Status: Married  Human resources officer Violence: Not At Risk (02/27/2022)   Humiliation, Afraid, Rape, and Kick questionnaire    Fear of Current or Ex-Partner: No    Emotionally Abused: No    Physically Abused: No    Sexually Abused: No    Past Medical History, Surgical history, Social history, and Family history were reviewed and updated as appropriate.   Please see review of systems for further details on the patient's review from today.   Objective:   Physical Exam:  LMP 01/08/1999   Physical Exam Constitutional:      General: She is not in acute distress. Musculoskeletal:        General: No deformity.  Neurological:     Mental Status: She is alert and oriented to person, place, and time.  Coordination: Coordination normal.  Psychiatric:        Attention and Perception: Attention and perception normal. She does not perceive auditory or visual hallucinations.        Mood and Affect: Mood is not anxious or depressed. Affect is not labile, blunt or angry.        Speech: Speech normal. Speech is not slurred.        Behavior: Behavior is not hyperactive.        Thought Content: Thought content normal. Thought content is not paranoid or delusional. Thought content does not include homicidal or suicidal ideation. Thought content does not include suicidal plan.        Cognition and Memory: Cognition and memory normal.        Judgment: Judgment normal.     Comments: Insight intact Hyperactive style less intense and less depressed Minimal tremor today     Lab Review:     Component Value Date/Time   NA 130 (L) 04/17/2021  1825   NA 134 06/14/2018 1444   K 3.8 04/17/2021 1825   CL 97 (L) 04/17/2021 1825   CO2 26 04/17/2021 1825   GLUCOSE 92 04/17/2021 1825   BUN 15 04/17/2021 1825   BUN 10 06/14/2018 1444   CREATININE 1.00 04/17/2021 1825   CREATININE 0.76 04/10/2020 1431   CALCIUM 9.0 04/17/2021 1825   PROT 6.8 02/25/2021 1614   PROT 6.5 12/19/2020 0754   ALBUMIN 4.1 02/25/2021 1614   ALBUMIN 4.5 12/19/2020 0754   AST 16 02/25/2021 1614   ALT 12 02/25/2021 1614   ALKPHOS 47 02/25/2021 1614   BILITOT 0.3 02/25/2021 1614   BILITOT 0.4 12/19/2020 0754   GFRNONAA 60 (L) 04/17/2021 1825   GFRNONAA 79 04/10/2020 1431   GFRAA 91 04/10/2020 1431       Component Value Date/Time   WBC 5.0 04/17/2021 1825   RBC 3.53 (L) 04/17/2021 1825   HGB 11.1 (L) 04/17/2021 1825   HCT 33.4 (L) 04/17/2021 1825   PLT 318 04/17/2021 1825   MCV 94.6 04/17/2021 1825   MCH 31.4 04/17/2021 1825   MCHC 33.2 04/17/2021 1825   RDW 12.5 04/17/2021 1825   LYMPHSABS 2.0 06/14/2018 0005   MONOABS 0.7 06/14/2018 0005   EOSABS 0.2 06/14/2018 0005   BASOSABS 0.0 06/14/2018 0005    No results found for: "POCLITH", "LITHIUM"   No results found for: "PHENYTOIN", "PHENOBARB", "VALPROATE", "CBMZ"   06/04/20 B12 normal at 496  01/21/2021 imipramine level 25, desipramine level 40 equals total 65 which is low on 75 mg daily. 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 They have one copy of the C allele and one copy of the G allele. This genotype suggests a normal response to certain ADHD medications.  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 This genotype suggests a lower risk of serious hypersensitivity reactions, including Stevens-Johnson  syndrome (SJS), toxic epidermal necrolysis (TEN), maculopapular eruptions, and Drug Reaction with Eosinophilia and Systemic Symptoms when taking certain mood stabilizers.  HLA-B*1502Lower Risk Not Present This patient does not carry the HLA-B*1502 allele or a closely related *15 allele. Absence of HLA-B*1502 and the closely related *15 alleles suggests lower risk of serious dermatologic reactions including toxic epidermal necrolysis (TEN) and Stevens-Johnson syndrome (SJS) when taking certain mood  stabilizers.  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 They carry two copies of the G allele. This genotype has been associated with an increased risk of adverse drug reactions with certain selective serotonin reuptake inhibitors.  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 The short promoter allele is reported to decrease expression of the serotonin transporter compared to the homozygous long promoter allele. The patient may have a moderately decreased likelihood of response to certain selective serotonin reuptake inhibitors due to the presence of the short form of the gene.  Pharmacokinetic Genes PK CES1A1Extensive (Normal) Metabolizer GLY/GLY CES1A1 - Gly allele enzyme activity: Normal CES1A1 - Gly allele enzyme activity: Normal  This genotype is most consistent with the extensive (normal) metabolizer phenotype.  The patient is expected to have normal enzyme activity.  CYP1A2Extensive (Normal) Metabolizer *1/*1 This genotype is most consistent with the extensive (normal) metabolizer phenotype.  CYP2B6Poor Metabolizer *6/*6 CYP2B6*6 allele enzyme activity: Reduced CYP2B6*6 allele enzyme activity: Reduced  This genotype is most consistent with the poor metabolizer phenotype. This patient  may have reduced enzyme activity as compared to individuals with the normal phenotype.  CYP2C19Extensive (Normal) Metabolizer *1/*1 CYP2C19*1 allele enzyme activity: Normal CYP2C19*1 allele enzyme activity: Normal  This genotype is most consistent with the extensive (normal) metabolizer phenotype.  CYP2C9Intermediate Metabolizer *1/*2 CYP2C9*1 allele enzyme activity: Normal CYP2C9*2 allele enzyme activity: Reduced  This genotype is most consistent with the intermediate metabolizer phenotype. This patient may have reduced enzyme activity as compared to individuals with the normal phenotype.  CYP2D6Extensive (Normal) Metabolizer *1/*41 CYP2D6*1 allele enzyme activity: Normal CYP2D6*41 allele enzyme activity: Reduced  This genotype is most consistent with the extensive (normal) metabolizer phenotype.  CYP3A4Poor Metabolizer *22/*22 CYP3A4*22 allele enzyme activity: Reduced CYP3A4*22 allele enzyme activity: Reduced  This genotype is most consistent with the poor metabolizer phenotype. This patient may have reduced enzyme activity as compared to individuals with the normal phenotype.  UGT1A4Ultrarapid Metabolizer *1/*3 UGT1A4*1 allele enzyme activity: Normal UGT1A4*3 allele enzyme activity: Increased  This genotype is most consistent with the ultrarapid metabolizer phenotype. This patient may have increased enzyme activity as compared to individuals with the normal phenotype.  UGT2B15Intermediate Metabolizer *2/*2 UGT2B15*2 allele enzyme activity: Reduced UGT2B15*2 allele enzyme activity: Reduced  This genotype is most consistent with the intermediate metabolizer phenotype. This patient may have reduced enzyme activity as compared to individuals with the normal phenotype. .res Assessment: Plan:    Courtney [name] was seen today for follow-up, depression and anxiety.  Diagnoses and all orders for this visit:  Major depressive disorder, recurrent episode, moderate (HCC) -      lithium carbonate (LITHOBID) 300 MG ER tablet; Take 2 tablets (600 mg total) by mouth at bedtime.  Generalized anxiety disorder -     lithium carbonate (LITHOBID) 300 MG ER tablet; Take 2 tablets (600 mg total) by mouth at bedtime.  Attention deficit hyperactivity disorder (ADHD), combined type  Other iron deficiency anemia    Greater than 50% of 30 min face to face time with patient was spent on counseling and coordination of care. We discussed  Genesight testing and her desire to take something for anxiety that is in the "Use as Directed" column.   Overall depression is better and anxiety was better too but not gone and worse without lithium.   Overall more irritable than depressed. Rec she not change meds on her  own.  This has been an issue  Chose TCA bc Genesight test;  disc SE in detail incl cardiac, etc on imipramine 100 mg HS for a week and level was WNL at 238. She stopped it then restarted at 50 mg HS. disc risk of relapse of dep but she hopes lithium will keep dep at bay  Continue lithium CR 300 mg HS bc helped anger and mood.  Will not likely get enough benefit on 150 mg daily so rec tx of tremor.  She has not had adequate trial of propranolol. Try propranolol 2 or 3 tablets twice daily as needed for tremor Continue lithium 300 mg try 1 and 1/2 and 2 tablets every other day to see if f mood is even better. Disc fear and stigma around lithium.  She agrees   B12 level checked 496 and normal.  Disc light therapy in detail and gave handout.     Adderall prn. Says 5 mg tablets less effective than 1/2 of a 10 mg tablet.  Taking and more productive and helps anxiety and function.  Not currently using , Ativan 1/2 mg tid prn since she is tired from it and not on Adderall usually.    Consider alternatives for anger outbursts, impulsivity like lithium, VPA, atypical.  Constipation management 1.  Lots of water 2.  Powdered fiber supplement such as MiraLAX, Citrucel, etc.  preferably with a meal 3.  2 stool softeners a day 4.  Milk of magnesia or magnesium tablets if needed  Supportive therapy around marital crisis and other stressors.  She needs some goals.  Needs to get out of the house chronically.  Started therapy with God[facility] Pick.  FU 8-12 weeks  Lynder Parents, MD, DFAPA   Please see After Visit Summary for patient specific instructions.  Future Appointments  Date Time Provider [place]  09/10/2022  9:00 AM Cottle, Billey Co., MD CP-CP None  11/12/2022 10:00 AM Cottle, Billey Co., MD CP-[facility] None     No orders of the defined types were placed in this encounter.     -------------------------------

## 2022-07-17 ENCOUNTER — Telehealth: Payer: Self-pay | Admitting: Psychiatry

## 2022-07-17 NOTE — Telephone Encounter (Signed)
Pt called asking if [place] tail mushroom mycelium powder is safe to take with her meds. She bought at [facility] [employer]. Contact # [phone]

## 2022-07-17 NOTE — Telephone Encounter (Signed)
Patient notified

## 2022-07-17 NOTE — Telephone Encounter (Signed)
See message from patient. She said she heard it helped with nerve pain and she reports a lot of pain in her spine. She wants to be sure it is safe to take with her MH medications.

## 2022-07-17 NOTE — Telephone Encounter (Signed)
Yes it should be safe

## 2022-07-20 ENCOUNTER — Other Ambulatory Visit: Payer: Self-pay

## 2022-07-20 ENCOUNTER — Encounter (HOSPITAL_BASED_OUTPATIENT_CLINIC_OR_DEPARTMENT_OTHER): Payer: Self-pay | Admitting: Urology

## 2022-07-20 ENCOUNTER — Emergency Department (HOSPITAL_BASED_OUTPATIENT_CLINIC_OR_DEPARTMENT_OTHER)
Admission: EM | Admit: 2022-07-20 | Discharge: 2022-07-20 | Disposition: A | Discharge status: Home / Self Care | Payer: Medicare Other | Attending: Emergency Medicine | Admitting: Emergency Medicine

## 2022-07-20 DIAGNOSIS — I2510 Atherosclerotic heart disease of native coronary artery without angina pectoris: Secondary | ICD-10-CM | POA: Diagnosis not present

## 2022-07-20 DIAGNOSIS — W57XXXA Bitten or stung by nonvenomous insect and other nonvenomous arthropods, initial encounter: Secondary | ICD-10-CM | POA: Diagnosis not present

## 2022-07-20 DIAGNOSIS — S20362A Insect bite (nonvenomous) of left front wall of thorax, initial encounter: Secondary | ICD-10-CM | POA: Diagnosis not present

## 2022-07-20 MED ORDER — ONDANSETRON 4 MG PO TBDP: 4.0000 mg | ORAL_TABLET | Freq: Three times a day (TID) | ORAL | 0 refills | Status: AC | PRN

## 2022-07-20 MED ORDER — DOXYCYCLINE HYCLATE 100 MG PO CAPS: 100.0000 mg | ORAL_CAPSULE | Freq: Two times a day (BID) | ORAL | 0 refills | Status: AC

## 2022-07-20 NOTE — ED Triage Notes (Signed)
Tick lodged in left side, red area with ring around it  Noticed it this am  Been there for 3 days

## 2022-07-20 NOTE — Discharge Instructions (Addendum)
Please complete your antibiotic course as prescribed.  If you develop any worsening symptoms please follow closely with your primary care physician.

## 2022-07-20 NOTE — ED Provider Notes (Signed)
Emergency Department Provider Note   I have reviewed the triage vital signs and the nursing notes.   HISTORY  Chief Complaint Tick Removal   HPI Courtney [name] is a 72 y.o. female with past history reviewed below presents emergency department with tick bite with rash.  She noticed a tick on her left flank 3 days ago.  The tick was very small and her husband attempted to remove it but she believes only pulled off the body of the tick.  She can still see a dark area in the center and a faint, red rash is developed around the area.  No fevers.  No other symptoms.    Past Medical History:  Diagnosis Date   ADD (attention deficit disorder)    Anxiety    Celiac disease    possible vs IBS   Complication of anesthesia    Coronary artery disease    Depression    no bipolar per Dr. [name]   GI problem    Olevia Perches   Gluten intolerance    Hyperlipidemia    IBS (irritable bowel syndrome)    Osteoarthritis    Osteoporosis    Pneumonia    PONV (postoperative nausea and vomiting)    Rheumatoid arthritis ([place])    Seasonal allergies 01/08/2012   hx. of multiple bronchitis related to this.   Shoulder pain, right     Review of Systems  Constitutional: No fever/chills Cardiovascular: Denies chest pain. Respiratory: Denies shortness of breath. Gastrointestinal: No abdominal pain.  No nausea, no vomiting.   Musculoskeletal: Negative for back pain. Skin: Positive rash and tick bite.  Neurological: Negative for headaches.   ____________________________________________   PHYSICAL EXAM:  VITAL SIGNS: ED Triage Vitals  Enc Vitals Group     BP 07/20/22 1107 127/80     Pulse Rate 07/20/22 1107 92     Resp 07/20/22 1107 18     Temp 07/20/22 1107 98.6 F (37 C)     Temp Source 07/20/22 1107 Oral     SpO2 07/20/22 1107 100 %     Weight 07/20/22 1105 117 lb 8.1 oz (53.3 kg)     Height 07/20/22 1105 4\' 11"  (1.499 m)   Constitutional: Alert and oriented. Well appearing and  in no acute distress. Eyes: Conjunctivae are normal.  Head: Atraumatic. Nose: No congestion/rhinnorhea. Mouth/Throat: Mucous membranes are moist.   Neck: No stridor.  Cardiovascular: Good peripheral circulation.  Respiratory: Normal respiratory effort.   Gastrointestinal: No distention.  Musculoskeletal: No gross deformities of extremities. Neurologic:  Normal speech and language.  Skin:  Skin is warm, dry and intact.  Pinpoint, dark area in the center of an approximately 1 cm circular rash to the left flank.    ____________________________________________   PROCEDURES  Procedure(s) performed:   .Foreign Body Removal  Date/Time: 07/20/2022 11:38 AM  Performed by: Margette Fast, MD Authorized by: Margette Fast, MD  Consent: Verbal consent obtained. Risks and benefits: risks, benefits and alternatives were discussed Consent given by: patient Body area: skin General location: trunk Location details: left flank Anesthesia: local infiltration  Sedation: Patient sedated: no  Patient restrained: no Patient cooperative: yes Complexity: simple 1 objects recovered. Objects recovered: tick head Post-procedure assessment: foreign body removed Patient tolerance: patient tolerated the procedure well with no immediate complications Comments: Area cleaned before and after procedure with betadine. Adhesive bandage applied.      ____________________________________________   INITIAL IMPRESSION / ASSESSMENT AND PLAN / ED COURSE  Pertinent labs &  imaging results that were available during my care of the patient were reviewed by me and considered in my medical decision making (see chart for details).   This patient is Presenting for Evaluation of tick bite, which does require a range of treatment options, and is a complaint that involves a moderate risk of morbidity and mortality.  The Differential Diagnoses include tick bite, transmission of tick borne illness, other foreign  body, cellulitis, abscess, etc.  Medical Decision Making: Summary:  Patient presents emergency department with tick head embedded in wound on her left flank.  There is mild redness as described above.  Using tweezers, I was able to remove the head.  No residual parts seen in the wound.  The wound was cleaned and bandaged.  Will start the patient on doxycycline given a faint, erythematous ring around the bite.  Zofran provided as the patient often experiences nausea with antibiotics. Advised close PCP follow up.   Patient's presentation is most consistent with acute, uncomplicated illness.   Disposition: discharge  ____________________________________________  FINAL CLINICAL IMPRESSION(S) / ED DIAGNOSES  Final diagnoses:  Tick bite of left front wall of thorax, initial encounter     NEW OUTPATIENT MEDICATIONS STARTED DURING THIS VISIT:  Discharge Medication List as of 07/20/2022 11:19 AM     START taking these medications   Details  doxycycline (VIBRAMYCIN) 100 MG capsule Take 1 capsule (100 mg total) by mouth 2 (two) times daily for 7 days., Starting Sun 07/20/2022, Until Sun 07/27/2022, Normal    ondansetron (ZOFRAN-ODT) 4 MG disintegrating tablet Take 1 tablet (4 mg total) by mouth every 8 (eight) hours as needed., Starting Sun 07/20/2022, Normal        Note:  This document was prepared using Dragon voice recognition software and may include unintentional dictation errors.  Nanda Quinton, MD, [facility]    Annalisia Ingber, Wonda Olds, MD 07/20/22 1140

## 2022-07-23 ENCOUNTER — Telehealth: Payer: Self-pay | Admitting: Internal Medicine

## 2022-07-23 NOTE — Telephone Encounter (Signed)
Pt wants to know if she need to do a follow up. Pt was seen at the ER for a tick bite? Call pt with update.  Best call back [phone]

## 2022-07-23 NOTE — Telephone Encounter (Signed)
Called pt she states they gave her doxycycline 7 day supply. Was told to f/u w/ PCP in 1 day. Inform pt no need to f/u in 1 day. Should finish antibiotic then f/u w/ [facility]. Pt agreed made appt for 07/29/22.Marland KitchenJohny Chess

## 2022-07-28 DIAGNOSIS — Z1231 Encounter for screening mammogram for malignant neoplasm of breast: Secondary | ICD-10-CM | POA: Diagnosis not present

## 2022-07-28 DIAGNOSIS — Z124 Encounter for screening for malignant neoplasm of cervix: Secondary | ICD-10-CM | POA: Diagnosis not present

## 2022-07-29 ENCOUNTER — Ambulatory Visit (INDEPENDENT_AMBULATORY_CARE_PROVIDER_SITE_OTHER): Payer: Medicare Other | Admitting: Internal Medicine

## 2022-07-29 ENCOUNTER — Encounter: Payer: Self-pay | Admitting: Internal Medicine

## 2022-07-29 VITALS — BP 110/68 | HR 82 | Temp 98.0°F | Ht 59.0 in | Wt 119.0 lb

## 2022-07-29 DIAGNOSIS — N959 Unspecified menopausal and perimenopausal disorder: Secondary | ICD-10-CM

## 2022-07-29 DIAGNOSIS — F419 Anxiety disorder, unspecified: Secondary | ICD-10-CM

## 2022-07-29 DIAGNOSIS — W57XXXD Bitten or stung by nonvenomous insect and other nonvenomous arthropods, subsequent encounter: Secondary | ICD-10-CM

## 2022-07-29 DIAGNOSIS — M059 Rheumatoid arthritis with rheumatoid factor, unspecified: Secondary | ICD-10-CM

## 2022-07-29 DIAGNOSIS — E871 Hypo-osmolality and hyponatremia: Secondary | ICD-10-CM

## 2022-07-29 DIAGNOSIS — E785 Hyperlipidemia, unspecified: Secondary | ICD-10-CM | POA: Diagnosis not present

## 2022-07-29 DIAGNOSIS — K5904 Chronic idiopathic constipation: Secondary | ICD-10-CM | POA: Diagnosis not present

## 2022-07-29 DIAGNOSIS — S30861A Insect bite (nonvenomous) of abdominal wall, initial encounter: Secondary | ICD-10-CM

## 2022-07-29 MED ORDER — TRIAMCINOLONE ACETONIDE 0.5 % EX CREA: 1.0000 | TOPICAL_CREAM | Freq: Three times a day (TID) | CUTANEOUS | 1 refills | Status: AC

## 2022-07-29 NOTE — Assessment & Plan Note (Signed)
Worse due to stress  

## 2022-07-29 NOTE — Progress Notes (Signed)
Subjective:  Patient ID: Courtney [name], female    DOB: Sep 09, 1950  Age: 72 y.o. MRN: [id number]  CC: No chief complaint on file.   HPI [place] And[place]Gregg presents for a tic bite, arthritis - on Hydroxychloroquine, hypothyroidism, constipation  Outpatient Medications Prior to Visit  Medication Sig Dispense Refill   albuterol (VENTOLIN HFA) 108 (90 Base) MCG/ACT inhaler Inhale 2 puffs into the lungs every 4 (four) hours as needed for wheezing or shortness of breath. 18 g 3   amoxicillin-clavulanate (AUGMENTIN) 500-125 MG tablet Take 1 tablet by mouth 3 (three) times daily. 30 tablet 0   cyclobenzaprine (FLEXERIL) 10 MG tablet Take 10 mg by mouth as needed.     fluticasone (FLONASE) 50 MCG/ACT nasal spray Place into both nostrils daily.     hydroxychloroquine (PLAQUENIL) 200 MG tablet Take 1 tablet by mouth 2 (two) times daily.     imipramine (TOFRANIL) 50 MG tablet TAKE 3 TABLETS(150 MG) BY MOUTH AT BEDTIME (Patient taking differently: Take 50 mg by mouth. Taking 50mg  daily) 270 tablet 0   levothyroxine (SYNTHROID) 50 MCG tablet Take 50 mcg by mouth daily before breakfast.     lithium carbonate (LITHOBID) 300 MG ER tablet Take 2 tablets (600 mg total) by mouth at bedtime. 180 tablet 0   LORazepam (ATIVAN) 1 MG tablet Take 1 tablet (1 mg total) by mouth every 8 (eight) hours. (Patient taking differently: Take 1 mg by mouth as needed.) 90 tablet 1   meloxicam (MOBIC) 7.5 MG tablet TAKE 1 TO 2 TABLETS(7.5 TO 15 MG) BY MOUTH DAILY AS NEEDED FOR PAIN 180 tablet 0   omega-3 acid ethyl esters (LOVAZA) 1 g capsule Take 2 capsules (2 g total) by mouth 2 (two) times daily. 120 capsule 11   ondansetron (ZOFRAN-ODT) 4 MG disintegrating tablet Take 1 tablet (4 mg total) by mouth every 8 (eight) hours as needed. 20 tablet 0   pravastatin (PRAVACHOL) 20 MG tablet Take one tablet by mouth 4 days per week (Patient taking differently: Take 20 mg by mouth daily. Take one tablet by mouth 1-2 days per  week) 48 tablet 3   promethazine-dextromethorphan (PROMETHAZINE-DM) 6.25-15 MG/5ML syrup Take 5 mLs by mouth 4 (four) times daily as needed for cough. 240 mL 0   Testosterone 1.62 % GEL Place 5 mg onto the skin daily. 30 g 3   traMADol (ULTRAM) 50 MG tablet TAKE 1/2 TO 1 TABLET(25 TO 50 MG) BY MOUTH BACK EVERY 8 HOURS AS NEEDED FOR SEVERE PAIN 90 tablet 0   vitamin E 400 UNIT capsule Take 800 Units by mouth daily.      amphetamine-dextroamphetamine (ADDERALL) 10 MG tablet Take 1 tablet (10 mg total) by mouth 2 (two) times daily. (Patient not taking: Reported on 07/14/2022) 180 tablet 0   nitroGLYCERIN (NITROSTAT) 0.4 MG SL tablet Place 1 tablet (0.4 mg total) under the tongue every 5 (five) minutes as needed for chest pain. (Patient not taking: Reported on 08/13/2021) 25 tablet 2   No facility-administered medications prior to visit.    ROS: Review of Systems  Constitutional:  Negative for activity change, appetite change, chills, fatigue and unexpected weight change.  HENT:  Negative for congestion, mouth sores and sinus pressure.   Eyes:  Negative for visual disturbance.  Respiratory:  Negative for cough and chest tightness.   Gastrointestinal:  Negative for abdominal pain and nausea.  Genitourinary:  Negative for difficulty urinating, frequency and vaginal pain.  Musculoskeletal:  Positive for arthralgias, back pain, gait  problem and neck stiffness.  Skin:  Negative for pallor and rash.  Neurological:  Negative for dizziness, tremors, weakness, numbness and headaches.  Psychiatric/Behavioral:  Negative for confusion, sleep disturbance and suicidal ideas.     Objective:  BP 110/68 (BP Location: Left Arm, Patient Position: Sitting, Cuff Size: Normal)   Pulse 82   Temp 98 F (36.7 C) (Oral)   Ht 4\' 11"  (1.499 m)   Wt 119 lb (54 kg)   LMP 01/08/1999   SpO2 97%   BMI 24.04 kg/m   BP Readings from Last 3 Encounters:  07/29/22 110/68  07/20/22 127/80  06/11/22 120/70    Wt  Readings from Last 3 Encounters:  07/29/22 119 lb (54 kg)  07/20/22 117 lb 8.1 oz (53.3 kg)  06/11/22 117 lb 8 oz (53.3 kg)    Physical Exam Constitutional:      General: She is not in acute distress.    Appearance: She is well-developed.  HENT:     Head: Normocephalic.     Right Ear: External ear normal.     Left Ear: External ear normal.     Nose: Nose normal.  Eyes:     General:        Right eye: No discharge.        Left eye: No discharge.     Conjunctiva/sclera: Conjunctivae normal.     Pupils: Pupils are equal, round, and reactive to light.  Neck:     Thyroid: No thyromegaly.     Vascular: No JVD.     Trachea: No tracheal deviation.  Cardiovascular:     Rate and Rhythm: Normal rate and regular rhythm.     Heart sounds: Normal heart sounds.  Pulmonary:     Effort: No respiratory distress.     Breath sounds: No stridor. No wheezing.  Abdominal:     General: Bowel sounds are normal. There is no distension.     Palpations: Abdomen is soft. There is no mass.     Tenderness: There is no abdominal tenderness. There is left CVA tenderness. There is no guarding or rebound.  Musculoskeletal:     Cervical back: Normal range of motion and neck supple. Tenderness present. No rigidity.  Lymphadenopathy:     Cervical: No cervical adenopathy.  Skin:    Findings: No erythema or rash.  Neurological:     Mental Status: She is oriented to person, place, and time.     Cranial Nerves: No cranial nerve deficit.     Motor: No abnormal muscle tone.     Coordination: Coordination normal.     Gait: Gait abnormal.     Deep Tendon Reflexes: Reflexes normal.  Psychiatric:        Behavior: Behavior normal.        Thought Content: Thought content normal.        Judgment: Judgment normal.     Lab Results  Component Value Date   WBC 5.0 04/17/2021   HGB 11.1 (L) 04/17/2021   HCT 33.4 (L) 04/17/2021   PLT 318 04/17/2021   GLUCOSE 92 04/17/2021   CHOL 174 05/01/2021   TRIG 72  05/01/2021   HDL 78 05/01/2021   LDLDIRECT 120.7 08/04/2012   LDLCALC 82 05/01/2021   ALT 12 02/25/2021   AST 16 02/25/2021   NA 130 (L) 04/17/2021   K 3.8 04/17/2021   CL 97 (L) 04/17/2021   CREATININE 1.00 04/17/2021   BUN 15 04/17/2021   CO2 26 04/17/2021  TSH 0.35 (L) 08/27/2020   INR 1.0 07/06/2020   HGBA1C 5.4 02/11/2018    No results found.  Assessment & Plan:   Problem List Items Addressed This Visit       Musculoskeletal and Integument   Tick bite    L flank Triamc cream prn      RA (rheumatoid arthritis) - Primary    On Hydroxychloroquine - better      Relevant Medications   hydroxychloroquine (PLAQUENIL) 200 MG tablet   Other Relevant Orders   TSH   Urinalysis   CBC with Differential/Platelet   Lipid panel   Comprehensive metabolic panel   Cortisol     Other   Menopausal disorder    Vit D3 and K2      Relevant Orders   TSH   Urinalysis   CBC with Differential/Platelet   Lipid panel   Comprehensive metabolic panel   Cortisol   Hyponatremia    Check CMET      Relevant Orders   TSH   Urinalysis   CBC with Differential/Platelet   Lipid panel   Comprehensive metabolic panel   Cortisol   Dyslipidemia   Relevant Orders   TSH   Lipid panel   Constipation    GI ref if needed      Relevant Orders   Ambulatory referral to Gastroenterology   Anxiety disorder    Worse due to stress.         Meds ordered this encounter  Medications   triamcinolone cream (KENALOG) 0.5 %    Sig: Apply 1 Application topically 3 (three) times daily.    Dispense:  30 g    Refill:  1      Follow-up: Return for Wellness Exam.  Walker Kehr, MD

## 2022-07-29 NOTE — Assessment & Plan Note (Addendum)
GI ref if needed

## 2022-07-29 NOTE — Assessment & Plan Note (Signed)
L flank Triamc cream prn

## 2022-07-29 NOTE — Patient Instructions (Signed)
Vit D3 and K2 

## 2022-07-29 NOTE — Assessment & Plan Note (Signed)
On Hydroxychloroquine - better

## 2022-07-29 NOTE — Assessment & Plan Note (Signed)
Vit D3 and K2 

## 2022-07-29 NOTE — Assessment & Plan Note (Signed)
Check CMET. 

## 2022-08-13 ENCOUNTER — Ambulatory Visit: Payer: Medicare Other | Admitting: Psychiatry

## 2022-08-21 ENCOUNTER — Encounter: Payer: Medicare Other | Admitting: Internal Medicine

## 2022-08-21 DIAGNOSIS — E039 Hypothyroidism, unspecified: Secondary | ICD-10-CM | POA: Diagnosis not present

## 2022-08-21 DIAGNOSIS — M832 Adult osteomalacia due to malabsorption: Secondary | ICD-10-CM | POA: Diagnosis not present

## 2022-08-21 DIAGNOSIS — M8588 Other specified disorders of bone density and structure, other site: Secondary | ICD-10-CM | POA: Diagnosis not present

## 2022-08-21 DIAGNOSIS — E538 Deficiency of other specified B group vitamins: Secondary | ICD-10-CM | POA: Diagnosis not present

## 2022-08-21 DIAGNOSIS — E871 Hypo-osmolality and hyponatremia: Secondary | ICD-10-CM | POA: Diagnosis not present

## 2022-08-21 DIAGNOSIS — N182 Chronic kidney disease, stage 2 (mild): Secondary | ICD-10-CM | POA: Diagnosis not present

## 2022-08-27 ENCOUNTER — Other Ambulatory Visit (INDEPENDENT_AMBULATORY_CARE_PROVIDER_SITE_OTHER): Payer: Medicare Other

## 2022-08-27 DIAGNOSIS — M059 Rheumatoid arthritis with rheumatoid factor, unspecified: Secondary | ICD-10-CM

## 2022-08-27 DIAGNOSIS — E785 Hyperlipidemia, unspecified: Secondary | ICD-10-CM

## 2022-08-27 DIAGNOSIS — E871 Hypo-osmolality and hyponatremia: Secondary | ICD-10-CM | POA: Diagnosis not present

## 2022-08-27 DIAGNOSIS — N959 Unspecified menopausal and perimenopausal disorder: Secondary | ICD-10-CM

## 2022-08-27 DIAGNOSIS — F413 Other mixed anxiety disorders: Secondary | ICD-10-CM | POA: Diagnosis not present

## 2022-08-27 LAB — CBC WITH DIFFERENTIAL/PLATELET
Basophils Absolute: 0.1 10*3/uL (ref 0.0–0.1)
Basophils Relative: 0.9 % (ref 0.0–3.0)
Eosinophils Absolute: 1 10*3/uL — ABNORMAL HIGH (ref 0.0–0.7)
Eosinophils Relative: 15.9 % — ABNORMAL HIGH (ref 0.0–5.0)
HCT: 41.6 % (ref 36.0–46.0)
Hemoglobin: 13.8 g/dL (ref 12.0–15.0)
Lymphocytes Relative: 22.9 % (ref 12.0–46.0)
Lymphs Abs: 1.4 10*3/uL (ref 0.7–4.0)
MCHC: 33 g/dL (ref 30.0–36.0)
MCV: 95.5 fl (ref 78.0–100.0)
Monocytes Absolute: 0.5 10*3/uL (ref 0.1–1.0)
Monocytes Relative: 8.2 % (ref 3.0–12.0)
Neutro Abs: 3.2 10*3/uL (ref 1.4–7.7)
Neutrophils Relative %: 51.1 % (ref 43.0–77.0)
Platelets: 350 10*3/uL (ref 150.0–400.0)
RBC: 4.36 Mil/uL (ref 3.87–5.11)
RDW: 13.7 % (ref 11.5–15.5)
WBC: 6.2 10*3/uL (ref 4.0–10.5)

## 2022-08-27 LAB — COMPREHENSIVE METABOLIC PANEL
ALT: 17 U/L (ref 0–35)
AST: 23 U/L (ref 0–37)
Albumin: 4 g/dL (ref 3.5–5.2)
Alkaline Phosphatase: 53 U/L (ref 39–117)
BUN: 21 mg/dL (ref 6–23)
CO2: 29 mEq/L (ref 19–32)
Calcium: 10 mg/dL (ref 8.4–10.5)
Chloride: 101 mEq/L (ref 96–112)
Creatinine, Ser: 0.95 mg/dL (ref 0.40–1.20)
GFR: 59.32 mL/min — ABNORMAL LOW (ref >60.00)
Glucose, Bld: 88 mg/dL (ref 70–99)
Potassium: 4.4 mEq/L (ref 3.5–5.1)
Sodium: 135 mEq/L (ref 135–145)
Total Bilirubin: 0.3 mg/dL (ref 0.2–1.2)
Total Protein: 6.6 g/dL (ref 6.0–8.3)

## 2022-08-27 LAB — URINALYSIS
Bilirubin Urine: NEGATIVE
Hgb urine dipstick: NEGATIVE
Ketones, ur: NEGATIVE
Leukocytes,Ua: NEGATIVE
Nitrite: NEGATIVE
Specific Gravity, Urine: 1.02 (ref 1.000–1.030)
Total Protein, Urine: NEGATIVE
Urine Glucose: NEGATIVE
Urobilinogen, UA: 0.2 (ref 0.0–1.0)
pH: 7.5 (ref 5.0–8.0)

## 2022-08-27 LAB — LIPID PANEL
Cholesterol: 174 mg/dL (ref 0–200)
HDL: 80.2 mg/dL (ref >39.00)
LDL Cholesterol: 82 mg/dL (ref 0–99)
NonHDL: 93.52
Total CHOL/HDL Ratio: 2
Triglycerides: 57 mg/dL (ref 0.0–149.0)
VLDL: 11.4 mg/dL (ref 0.0–40.0)

## 2022-08-27 LAB — TSH: TSH: 1.33 u[IU]/mL (ref 0.35–5.50)

## 2022-08-27 LAB — CORTISOL: Cortisol, Plasma: 11.5 ug/dL

## 2022-08-29 DIAGNOSIS — E039 Hypothyroidism, unspecified: Secondary | ICD-10-CM | POA: Diagnosis not present

## 2022-09-08 ENCOUNTER — Encounter: Payer: Medicare Other | Admitting: Internal Medicine

## 2022-09-09 DIAGNOSIS — M0579 Rheumatoid arthritis with rheumatoid factor of multiple sites without organ or systems involvement: Secondary | ICD-10-CM | POA: Diagnosis not present

## 2022-09-09 DIAGNOSIS — M1991 Primary osteoarthritis, unspecified site: Secondary | ICD-10-CM | POA: Diagnosis not present

## 2022-09-09 DIAGNOSIS — M25511 Pain in right shoulder: Secondary | ICD-10-CM | POA: Diagnosis not present

## 2022-09-09 DIAGNOSIS — Z79899 Other long term (current) drug therapy: Secondary | ICD-10-CM | POA: Diagnosis not present

## 2022-09-09 DIAGNOSIS — R5383 Other fatigue: Secondary | ICD-10-CM | POA: Diagnosis not present

## 2022-09-09 DIAGNOSIS — Z6823 Body mass index (BMI) 23.0-23.9, adult: Secondary | ICD-10-CM | POA: Diagnosis not present

## 2022-09-09 DIAGNOSIS — M79642 Pain in left hand: Secondary | ICD-10-CM | POA: Diagnosis not present

## 2022-09-10 ENCOUNTER — Encounter: Payer: Self-pay | Admitting: Psychiatry

## 2022-09-10 ENCOUNTER — Ambulatory Visit (INDEPENDENT_AMBULATORY_CARE_PROVIDER_SITE_OTHER): Payer: Medicare Other | Admitting: Psychiatry

## 2022-09-10 DIAGNOSIS — F902 Attention-deficit hyperactivity disorder, combined type: Secondary | ICD-10-CM

## 2022-09-10 DIAGNOSIS — F331 Major depressive disorder, recurrent, moderate: Secondary | ICD-10-CM

## 2022-09-10 DIAGNOSIS — F411 Generalized anxiety disorder: Secondary | ICD-10-CM | POA: Diagnosis not present

## 2022-09-10 DIAGNOSIS — D508 Other iron deficiency anemias: Secondary | ICD-10-CM | POA: Diagnosis not present

## 2022-09-10 MED ORDER — AMPHETAMINE-DEXTROAMPHET ER 15 MG PO CP24
15.0000 mg | ORAL_CAPSULE | ORAL | 0 refills | Status: DC
Start: 2022-09-10 — End: 2022-10-14

## 2022-09-10 MED ORDER — IMIPRAMINE HCL 25 MG PO TABS
75.0000 mg | ORAL_TABLET | Freq: Every day | ORAL | 0 refills | Status: DC
Start: 2022-09-10 — End: 2022-12-30

## 2022-09-10 NOTE — Patient Instructions (Signed)
Start Adderall XR 15 mg 1 each AM Increase imipramine to 75 mg nightly B 6 500 mg twice daily for tremor (AMAZON)

## 2022-09-10 NOTE — Progress Notes (Signed)
Courtney [name] [id number] 04-25-1951 72 y.o.  Subjective:   Patient ID:  Courtney [name] is a 72 y.o. (DOB 11-20-1950) female.  Chief Complaint:  Chief Complaint  Patient presents with   Follow-up     Courtney [name] presents to the office today for follow-up of first visit 03/01/2020 referred by a friend [name].  Was prescribed Viibryd and Vyvanse was changed to Concerta 54 mg to try to reduce the amount of dry mouth.  05/02/2020 appointment with the following noted: Less dry mouth with Concerta but didn't seem to last beyond 3-4 PM. Viibryd 20 mg daily.  Hard to get enough calories at breakfast.  But has been taking both in the morning.  Some benefit with Viibryd. Is there something I can take besides a stimulant, bc fears it is stressing her body.  Dx college exhausted adrenal glands.  It helps.  Asked questions about alternatives to stimulants.  Reduced Concerta to reduce anxiety and see if can achieve, but she thinks it's worse in the evening. Anxiety is still a struggle.  Life crisis moment.  Chronic marital dissatisfaction worse now that both are retired.  Feels like she's in a prison.  Family notices she's stressed.  Has done therapy since age 64 yo.  Has done Hindu meditation without help.  Christian faith disciplines.  Grew up caretaking.  I'm done and I need to change.  Goal is calm down enough to function through the situation. Tingling foot and wants B12 testing. Plan: Switch Viibryd to 20 mg in evening meal to get better absorption.  06/18/2020 appointment with following noted: No difference in anxiety or mood with Viibryd 20 mg daily.  She doesn't want to increase it. Questions about Viibryd and Genesight testing.  Asks about differences between Adderall and MPH.   Missed for 3 days Concerta and took Adderall.    Tired by 4-5 PM and doesn't like that.    Plan: She wants to wean off the Viibryd because she does not feel it has been helpful and she does not  want to increase the dosage.  07/03/2020 phone call patient stating she wanted to continue Viibryd 20 mg daily.  She also requested refill of Concerta 54 mg  08/14/2020 appointment with the following noted: Was told she couldn't take Viibryd with one of the pain meds and had TKR.  So stopped it.  Couldn't tolerate pain meds except tramadol.  Had more pain than expected.   Stopped Viibryd about 07/15/20.  She had taken tramadol with Viibryd in the past without a problem.   Noticing more pain after surgery in non-surgical places. Seems to be sensitive to getting lightheaded and confused with low blood sugar.   Poor attention and scattered since surgery.  Also more tired. Occ taken tramadol and otherwise just Tylenol. Impossible to tell the effect of the Viibryd given she hasn't been herself for months. Concerned about H's Geoff's memory who is also a patient here.  Wants him to get neuropsych testing. Plan because patient is med sensitive we will start very low-dose fluvoxamine 25 mg nightly Per her request continue Concerta 54 mg every morning  10/25/2020 appointment with the following noted: Several phone calls since being here.  The first led to increasing fluvoxamine to 1-1/2 of the 25 mg tablets due to lack of effect at 3 weeks. 09/21/2020 she called asking to stop Concerta which she did. She called again wanting to start Adderall.  Because she had taken it in 2019 it was agreed  that she could start Adderall 10 mg twice daily. Stress dealing with husband and whether to move or not.  May separate.  Anxiety is very high.  Hard to calm down around him. $ stress.  Explodes with anger at husband.  Doesn't think he can sell the house on his own. Never been this stressed and anxious and in this kind of circumstance before. Never got the Adderall DT need for PA. Only caffeine in the AM Plan: Rec increase Luvox from 37.5 mg daily to 50 mg HS for a week then increase to 75 mg daily and possibly higher.   B12 level checked 496 and normal. Hold Adderall until the anxiety is under control.   12/03/2020 phone call from patient: Patient called stating fluvoxamine made her more anxious and high strung and she stopped it.  She wanted an earlier appointment which was not available at the time she was given the option to see a nurse practitioner but refused.  12/19/2020 appointment with the following noted: Life very stressful right now and not getting better.  B in law died and marriage falling apart. Separating.   More than I can handle including anxiety and depression. Plan: Rec trial beta blocker propranolol 10-30 mg  twice daily for anxiety Consider TCA bc Genesight test  B12 level checked 496 and normal. Hold Adderall until the anxiety is under control.   01/01/2021 phone call from patient with nurse as follows: She is taking up to 30 mg propranolol twice daily without sufficient benefit for her anxiety.  01/03/2021 appointment with the following noted: Rare Adderall.  Propranolol didn't help anxiety much at 30 mg BID without SE.[name]   Thinks she's gotten depressed which is unusual.  In a perfect storm with divorce and moving and financial stress.  Not functioning well.  Hard to make a decision.  Poor productivity.   Having a lot of pain, chronically and worse lately. Starting Lyrica today. Taking alprazolam about 0.5 mg daily. Plan: Yes imipramine 25 and increase to 75 mg HS and then check blood level  02/26/21 appt noted: 01/21/2021 serum imipramine and desipramine total was 65 at 75 mg daily. It has made a difference.  But has made so many changes and moving.  Stress goes to GI system.   Was in ER last night with GI px and may have ulcer.  Unable to eat without nausea.  Lump in the throat nausea. Mild taking the edge off.  Can still explode. Can't remember the SE at 100 mg daily. GI dominating with pain and nausea. Sleep varies from good or bothered by sickness. Usually fine but sometimes can't  turn her brain off.  Moves in 5 days. Plan: Continue imipramine and increase to 100 mg again as soon as nausea managed. Add risperidone 1 mg HS off label for nausea and anxiety.  03/28/2021 appt noted: She is not taking the risperidone.  Tried it for a couple of weeks but did not help the nausea.  Didn't notice it helping anxiety but had tremor.  Off for 2 weeks and tremor better. Still on imipramine 75 mg nightly.  She did increase but wasn't sure it was causing the tremor as instructed. This week to GI and work up ordered and doubled med dose. Not handling the transition well.  Very irritable with husband.  Such a sense of urgency.   Not often with Xanax.   Not on Adderall.   Plan: imipramine and increase to 100 mg nightly.  B12 level checked  496 and normal. Hold Adderall until the anxiety is under control.  Switch Xanax to Ativan 1 mg tid since she is not on Adderall.    05/02/2021 appt noted: Increased imipramine 100 mg HS. Tolerated the increase Spleen bleed. Yesterday decent energy compared to what she had.  Ativan made her sleepy but only took it once. Sleeping a lot DT spleen injury. Can't tell about depression bc of injury kept her in bed.  Has to move to feel normal. Plan: imipramine and increase to 150 mg nightly, based on prior level.  05/29/21 appt noted: SE dry mouth Increased imipramine 150 mg HS Seen improvement in mood.  Meeting irritating situations with less response.  Better self control.  Less fear and anxiety. Ativan 1 mg prn makes her too sleepy and tired. Occ taken 1/2 Adderall with some energy and motivation.   GI work up ongoing with gastric emptying yesterday.  It's delayed ongoing.  GI doctor has commented on her hostility.   Sleeping really well.   Worst dep and anxiety was when moving and now 50% better.  Need to have some goals and since moving into townhouse and unknown what is next.  May move out of town to be near the kids at part of the year but it's too  cold.  Big decisions are hard. Difficulty with back pain so hasn't unpacked.   More hopeful about her health. Seasonal depression Plan: Imipramine is helpful but increase could be more helpful based on level.  Disc pros and cons on increase.  She wants to increase it..  Consider lithium augmentation. Increase imipramine to 200 mg nightly, based on prior level Disc light therapy in detail and gave handout.    Adderall prn. Reduce Ativan 1/2 mg tid prn since she is tired from it and not on Adderall usually.    07/05/2021 phone call complaining of dry mouth with imipramine.  Frustrating because of reportedly low sodium and was told to drink less water. MD response:We increased the imipramine from 3 of the 50 mg tablets to 4 of the 50 mg tablets on February 1.  If she has seen additional improvement in her mood it is to her advantage to continue the current dosage.  If she has not seen additional improvement since increasing the dose she can drop the imipramine back to 3 tablets daily. In addition to that she should discuss the dry mouth problem with her pharmacist as the pharmacist may have some other recommendations to help manage it.  But the usual recommendation is to switch her toothpaste to Biotene toothpaste and use the Biotene mouthwash and Biotene spray to manage the dry mouth as best she can. Patient response:Notified patient. She said the issue is more complex than just dry mouth. She has been having GI issues and has been very sick, had a splenic bleed, is unable to get out of bed at times. Dr[facility] Shawn Stall at [facility] is her GI doctor. Patient spoke to her after Gloris Manchester talked to her today and is wanting her to stop the imipramine to eliminate that as a source of her issues.   MD response:Okay she can come off the imipramine better mood anxiety and irritability will get worse off of it because it helped her.  However we cannot start any new medicines until we see if her physical symptoms are any  different off the medicine.  She can reduce imipramine by 1 tablet every 3 to 4 days.  The dry mouth will not resolve  until she is off the medicine.  The splenic bleed is not related to imipramine unless she has some bizarre sort of allergy.  08/01/21 appt noted: Micah Flesher totally off imipramine for a week and GI px not better and restarted imipramine to 100 mg HS DT chronic constipation and then this week increased to 150 just this week.   Therapy with Vikki Ports helping a lot. Overall depression and anxiety is better. Trying to find endo.  Medically cleared from splenic bleed but doesn't think it is fine. Episode of abd paine with result in bed 5 days but back out of bed and functioning again. Hasn't needed lorazepam lately. Recognizes still angry and intense too much .   Plan check level and add lithium 300  10/07/21 appt noted; Taboo about lithium and didn't take it. Irritability is still focused and directed at H and not sure how it would be wihtout him.  Trying to address this with therapy and faith and prayer.   Seeing Danice Goltz.   Everything fuels my fire.  Erupting this is never gonna change rage.  Recognizes this part is her problem.  Gets upset with him not doing things she wants done. Body getting stronger since Xmas and more she can do the better her depression.   Still some stiffness with pain in spine and leg in AM and then gets better. Not seriously depressed if can move. Don't handle inactivity well.  Able to do more now though has helped. Can't control her reaction to Trey Paula though knows it is over the top since she retired. Intermittent nausea with activity causes her to go to bed.  Waves of pain with it.  Frustrated cause is unknown. Plan: Continue imipramine 150 mg HS for a week and level was WNL at 238. Augment with lithium CR 300 mg HS for anger and mood  01/22/22 appt noted: Currently take imipramine 100 mg HS and lithium CR 300 mg HS, Adderall 10 BID Reduced imipramine to  help tremor and it is some better.  Occ tremor, positional.  Mood is a lot better with lithium.  Right away clearing of haze and calming down.  Better outlook and been like that since then. Questions about effects on organs.   Sleep well.   Not sig anxious now.  Occ moments of anxiety.   Asks if could stop imipramine bc benefit with lithium.  04/10/22 appt noted: Currently stopped imipramine 100 mg HS (2 days ago) and taking lithium CR 300 mg HS, Adderall 10 BID Good but have to stop the imipramine bc of the shaking.  She cut dose to 50 mg QOD. Tremor worse with R hand.  R handed.  Feels like lithium helped She wants to see what mood is like without imipramine.  Less tremor with less imipramine. Tremor is better with less right now. May need shoulder replacement DT pain.  Also needs spinal fusion DT dropped foot. Hard to talk about it bc so dreads it.   Irritability is there and when ignited is bad but working on it in therapy.    Been angry my whole life.  Back to trauma in childhood.  Overall though better with lithium.   Miralax helped GI problems but waves of ok and then GI px.   Needs Adderall daily. Takes Ativan just occ with stressful circumstances. Plan: on imipramine 100 mg HS for a week and level was WNL at 238. She stopped it this week and disc risk of relapse of dep but she  hopes lithium will keep dep at bay Continue lithium CR 300 mg HS bc helped anger and mood  06/17/22 appt noted: recently taking 1/2 lithium 300 and imipramine 50 mg HS for 4-5 days and tremor better with less lithium. CO tremor;   irritates her and makes her feel bad about herself.  Never really tried more 10 mg propranolol for tremor. More pain in shoulder.   Not sure if any change in explosive irritation at H.  Not sure if it is her H or bc of the relationship. Depression is not that bad in the last month and no worse with less imipramine.   Would like to put a spacer between triggers and what she says to  H .  Not good.   When in [place] 2 yr ago for 2 mos alone but still had underlying anger problems. Plan: no med changes  07/14/22 appt noted: Increased lithium back to 1 and 1/2 tablet for a month.   Till has it in R hand esp.  Uses propranolol sometimes. Only tried it 4 times. An on impramine 50 mg HS imipramine 100 mg HS for a week and level was WNL at 238. Mood not bad at all  Irritability ?  Better with more lithium.  09/10/22 appt noted: Chronic procrastination unless workig job. Mood is not bad except triggered by H.  No change with increase lithium.  B6 400 BID has reduced and managed tremor. Almost entirely stopped Adderall bc GI sx if not taken with food.  Gut never normal since spleen issue.  Feels something wrong with GI tract.  Cycles of constipation and N and then abd cramping for days at time and then it will be ok for awhile.   Psych meds imipramine 50 mg HS, lithium 300/450 QOD, Adderall 10 BID prn, lorazepam 1 mg prn (none lately, I don't like to be slowed down). Taking miralax every other day.   Anxiety is not unusuallly high  no panic but some chronic anxiety. Sleep well at night and needs nap if not 8 hours. More postive when on Adderall re: mood and better focused and task completion.    Past Psychiatric Medication Trials: Trintellix NR, sertraline?,  Viibryd 20, remote zoloft, Paxil, Cymbalta couple days ? Adverse reaction Imipramine 150 better imipramine 100 mg HS for a week and level was WNL at 238. Fluvoxamine SE anxiety but ? Adequacy of trial bc stress Fluvoxamine 100 in 2015 per Dr. 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  Review of Systems:  Review of Systems   Cardiovascular:  Negative for chest pain.  Gastrointestinal:  Positive for abdominal pain and nausea.  Musculoskeletal:  Positive for arthralgias and back pain.  Neurological:  Positive for dizziness and tremors. Negative for weakness.  Psychiatric/Behavioral:  Positive for dysphoric mood. Negative for agitation, confusion, decreased concentration and hallucinations. The patient is nervous/anxious.     Medications: I have reviewed the patient's current medications.  Current Outpatient Medications  Medication Sig Dispense Refill   albuterol (VENTOLIN HFA) 108 (90 Base) MCG/ACT inhaler Inhale 2 puffs into the lungs every 4 (four) hours as needed for wheezing or shortness of breath. 18 g 3   amoxicillin-clavulanate (AUGMENTIN) 500-125  MG tablet Take 1 tablet by mouth 3 (three) times daily. 30 tablet 0   cyclobenzaprine (FLEXERIL) 10 MG tablet Take 10 mg by mouth as needed.     fluticasone (FLONASE) 50 MCG/ACT nasal spray Place into both nostrils daily.     hydroxychloroquine (PLAQUENIL) 200 MG tablet Take 1 tablet by mouth 2 (two) times daily.     imipramine (TOFRANIL) 50 MG tablet TAKE 3 TABLETS(150 MG) BY MOUTH AT BEDTIME (Patient taking differently: Take 50 mg by mouth. Taking 50mg  daily) 270 tablet 0   levothyroxine (SYNTHROID) 50 MCG tablet Take 50 mcg by mouth daily before breakfast.     lithium carbonate (LITHOBID) 300 MG ER tablet Take 2 tablets (600 mg total) by mouth at bedtime. 180 tablet 0   LORazepam (ATIVAN) 1 MG tablet Take 1 tablet (1 mg total) by mouth every 8 (eight) hours. (Patient taking differently: Take 1 mg by mouth as needed.) 90 tablet 1   meloxicam (MOBIC) 7.5 MG tablet TAKE 1 TO 2 TABLETS(7.5 TO 15 MG) BY MOUTH DAILY AS NEEDED FOR PAIN 180 tablet 0   omega-3 acid ethyl esters (LOVAZA) 1 g capsule Take 2 capsules (2 g total) by mouth 2 (two) times daily. 120 capsule 11   ondansetron (ZOFRAN-ODT) 4 MG disintegrating tablet Take 1 tablet (4 mg total) by mouth every 8  (eight) hours as needed. 20 tablet 0   pravastatin (PRAVACHOL) 20 MG tablet Take one tablet by mouth 4 days per week (Patient taking differently: Take 20 mg by mouth daily. Take one tablet by mouth 1-2 days per week) 48 tablet 3   promethazine-dextromethorphan (PROMETHAZINE-DM) 6.25-15 MG/5ML syrup Take 5 mLs by mouth 4 (four) times daily as needed for cough. 240 mL 0   Testosterone 1.62 % GEL Place 5 mg onto the skin daily. 30 g 3   traMADol (ULTRAM) 50 MG tablet TAKE 1/2 TO 1 TABLET(25 TO 50 MG) BY MOUTH BACK EVERY 8 HOURS AS NEEDED FOR SEVERE PAIN 90 tablet 0   triamcinolone cream (KENALOG) 0.5 % Apply 1 Application topically 3 (three) times daily. 30 g 1   vitamin E 400 UNIT capsule Take 800 Units by mouth daily.      amphetamine-dextroamphetamine (ADDERALL XR) 15 MG 24 hr capsule Take 1 capsule by mouth every morning. 30 capsule 0   imipramine (TOFRANIL) 25 MG tablet Take 3 tablets (75 mg total) by mouth at bedtime. 270 tablet 0   nitroGLYCERIN (NITROSTAT) 0.4 MG SL tablet Place 1 tablet (0.4 mg total) under the tongue every 5 (five) minutes as needed for chest pain. (Patient not taking: Reported on 08/13/2021) 25 tablet 2   No current facility-administered medications for this visit.    Medication Side Effects: Other: dry mouth  Allergies:  Allergies  Allergen Reactions   Clarithromycin Nausea Only   Covid-19 (Mrna) Vaccine Other (See Comments)    Acute Vasculitis; excessive bleeding  Other reaction(s): Other (See Comments) Acute Vasculitis; excessive bleeding    Other Other (See Comments)   Bactrim [Sulfamethoxazole-Trimethoprim] Nausea And Vomiting   Morphine Nausea And Vomiting    Other reaction(s): Nausea/Vomiting   Morphine And Codeine Nausea And Vomiting   Azithromycin Nausea And Vomiting and Nausea Only   Ceclor [Cefaclor] Nausea And Vomiting    Can take Augmentin ok   Claritin [Loratadine]     REACTION: bruises   Codeine Nausea And Vomiting    Can take Tramadol ok    Cymbalta [Duloxetine Hcl]    Erythromycin Nausea And  Vomiting    Other reaction(s): Nausea/Vomiting   Erythromycin Base Nausea And Vomiting   Levofloxacin Nausea Only    REACTION: nausea   Lipitor [Atorvastatin]     Other reaction(s): Myalgias (intolerance)   Nitrofurantoin Nausea Only   Oxycodone Other (See Comments)   Penicillins Nausea And Vomiting    Can take Augmentin   Propoxyphene Nausea Only    dizzy   Propoxyphene N-Acetaminophen Nausea Only    dizzy    Past Medical History:  Diagnosis Date   ADD (attention deficit disorder)    Anxiety    Celiac disease    possible vs IBS   Complication of anesthesia    Coronary artery disease    Depression    no bipolar per Dr. Nolen Mu   GI problem    Juanda Chance   Gluten intolerance    Hyperlipidemia    IBS (irritable bowel syndrome)    Osteoarthritis    Osteoporosis    Pneumonia    PONV (postoperative nausea and vomiting)    Rheumatoid arthritis (HCC)    Seasonal allergies 01/08/2012   hx. of multiple bronchitis related to this.   Shoulder pain, right     Family History  Problem Relation Age of Onset   Heart disease Mother    Heart attack Mother    Prostate cancer Father    Heart disease Sister    Heart disease Maternal Grandmother    Breast cancer Paternal Grandmother    Colon cancer Paternal Grandfather    Coronary artery disease Other        FH Female 1st degree relative <60   ADD / ADHD Other    Other Daughter        gluten intolerance    Social History   Socioeconomic History   Marital status: Married    Spouse name: Not on file   Number of children: 2   Years of education: Not on file   Highest education level: Not on file  Occupational History   Occupation: Network engineer  Tobacco Use   Smoking status: Never   Smokeless tobacco: Never   Tobacco comments:    only Manufacturing systems engineer Use: Never used  Substance and Sexual Activity   Alcohol use: Yes    Comment: 0-1 per day   Drug  use: No   Sexual activity: Yes  Other Topics Concern   Not on file  Social History Narrative   Married for last 42 years.Lives with husband.Retired Human resources officer.Originally from [place].   Social Determinants of Health   Financial Resource Strain: Low Risk  (02/27/2022)   Overall Financial Resource Strain (CARDIA)    Difficulty of Paying Living Expenses: Not hard at all  Food Insecurity: No Food Insecurity (02/27/2022)   Hunger Vital Sign    Worried About Running Out of Food in the Last Year: Never true    Ran Out of Food in the Last Year: Never true  Transportation Needs: No Transportation Needs (02/27/2022)   PRAPARE - Administrator, Civil Service (Medical): No    Lack of Transportation (Non-Medical): No  Physical Activity: Sufficiently Active (02/27/2022)   Exercise Vital Sign    Days of Exercise per Week: 5 days    Minutes of Exercise per Session: 30 min  Stress: No Stress Concern Present (02/27/2022)   [employer] of Occupational Health - Occupational Stress Questionnaire    Feeling of Stress : Not at all  Social Connections: Socially Integrated (  02/27/2022)   Social Connection and Isolation Panel [NHANES]    Frequency of Communication with Friends and Family: More than three times a week    Frequency of Social Gatherings with Friends and Family: More than three times a week    Attends Religious Services: 1 to 4 times per year    Active Member of [employer] or Organizations: Yes    Attends Banker Meetings: 1 to 4 times per year    Marital Status: Married  Catering manager Violence: Not At Risk (02/27/2022)   Humiliation, Afraid, Rape, and Kick questionnaire    Fear of Current or Ex-Partner: No    Emotionally Abused: No    Physically Abused: No    Sexually Abused: No    Past Medical History, Surgical history, Social history, and Family history were reviewed and updated as appropriate.   Please see review of systems for further  details on the patient's review from today.   Objective:   Physical Exam:  LMP 01/08/1999   Physical Exam Constitutional:      General: She is not in acute distress. Musculoskeletal:        General: No deformity.  Neurological:     Mental Status: She is alert and oriented to person, place, and time.     Coordination: Coordination normal.  Psychiatric:        Attention and Perception: Attention and perception normal. She does not perceive auditory or visual hallucinations.        Mood and Affect: Mood is not anxious or depressed. Affect is not labile or angry.        Speech: Speech normal. Speech is not slurred.        Behavior: Behavior is not hyperactive.        Thought Content: Thought content normal. Thought content is not paranoid or delusional. Thought content does not include homicidal or suicidal ideation. Thought content does not include suicidal plan.        Cognition and Memory: Cognition and memory normal.        Judgment: Judgment normal.     Comments: Insight intact Hyperactive style less intense and less depressed Minimal tremor today     Lab Review:     Component Value Date/Time   NA 135 08/27/2022 0940   NA 134 06/14/2018 1444   K 4.4 08/27/2022 0940   CL 101 08/27/2022 0940   CO2 29 08/27/2022 0940   GLUCOSE 88 08/27/2022 0940   BUN 21 08/27/2022 0940   BUN 10 06/14/2018 1444   CREATININE 0.95 08/27/2022 0940   CREATININE 0.76 04/10/2020 1431   CALCIUM 10.0 08/27/2022 0940   PROT 6.6 08/27/2022 0940   PROT 6.5 12/19/2020 0754   ALBUMIN 4.0 08/27/2022 0940   ALBUMIN 4.5 12/19/2020 0754   AST 23 08/27/2022 0940   ALT 17 08/27/2022 0940   ALKPHOS 53 08/27/2022 0940   BILITOT 0.3 08/27/2022 0940   BILITOT 0.4 12/19/2020 0754   GFRNONAA 60 (L) 04/17/2021 1825   GFRNONAA 79 04/10/2020 1431   GFRAA 91 04/10/2020 1431       Component Value Date/Time   WBC 6.2 08/27/2022 0940   RBC 4.36 08/27/2022 0940   HGB 13.8 08/27/2022 0940   HCT 41.6  08/27/2022 0940   PLT 350.0 08/27/2022 0940   MCV 95.5 08/27/2022 0940   MCH 31.4 04/17/2021 1825   MCHC 33.0 08/27/2022 0940   RDW 13.7 08/27/2022 0940   LYMPHSABS 1.4 08/27/2022 0940   MONOABS 0.5  08/27/2022 0940   EOSABS 1.0 (H) 08/27/2022 0940   BASOSABS 0.1 08/27/2022 0940    No results found for: "POCLITH", "LITHIUM"   No results found for: "PHENYTOIN", "PHENOBARB", "VALPROATE", "CBMZ"   06/04/20 B12 normal at 496  01/21/2021 imipramine level 25, desipramine level 40 equals total 65 which is low on 75 mg daily. 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 They have one copy of the C allele and one copy of the G allele. This genotype suggests a normal response to certain ADHD medications.  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 This genotype suggests a lower risk of serious hypersensitivity reactions, including Stevens-Johnson syndrome (SJS), toxic epidermal necrolysis (TEN), maculopapular eruptions, and Drug Reaction with Eosinophilia and Systemic Symptoms when taking certain mood stabilizers.  HLA-B*1502Lower Risk Not Present This patient does not carry the HLA-B*1502 allele or a closely related *15 allele. Absence of HLA-B*1502 and the closely related *15 alleles suggests lower risk of serious dermatologic reactions including toxic epidermal necrolysis (TEN) and Stevens-Johnson syndrome (SJS) when taking certain mood stabilizers.  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 They carry two copies of the G allele. This genotype has been associated with an increased risk of adverse drug  reactions with certain selective serotonin reuptake inhibitors.  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 The short promoter allele is reported to decrease expression of the serotonin transporter compared to the homozygous long promoter allele. The patient may have a moderately decreased likelihood of response to certain selective serotonin reuptake inhibitors due to the presence of the short form of the gene.  Pharmacokinetic Genes PK CES1A1Extensive (Normal) Metabolizer GLY/GLY CES1A1 - Gly allele enzyme activity: Normal CES1A1 - Gly allele enzyme activity: Normal  This genotype is most consistent with the extensive (normal) metabolizer phenotype.  The patient is expected to have normal enzyme activity.  CYP1A2Extensive (Normal) Metabolizer *1/*1 This genotype is most consistent with the extensive (normal) metabolizer phenotype.  CYP2B6Poor Metabolizer *6/*6 CYP2B6*6 allele enzyme activity: Reduced CYP2B6*6 allele enzyme activity: Reduced  This genotype is most consistent with the poor metabolizer phenotype. This patient may have reduced enzyme activity as compared to individuals with the normal phenotype.  CYP2C19Extensive (Normal) Metabolizer *1/*1 CYP2C19*1 allele enzyme activity: Normal CYP2C19*1 allele enzyme activity: Normal  This genotype is most consistent with the extensive (normal) metabolizer phenotype.  CYP2C9Intermediate Metabolizer *1/*2 CYP2C9*1 allele enzyme activity: Normal CYP2C9*2 allele enzyme activity: Reduced  This genotype is most consistent with the intermediate metabolizer phenotype. This patient may have reduced enzyme activity as compared to individuals with the normal phenotype.  CYP2D6Extensive (Normal) Metabolizer *1/*41 CYP2D6*1 allele enzyme activity: Normal CYP2D6*41 allele enzyme activity: Reduced  This genotype is most consistent with the  extensive (normal) metabolizer phenotype.  CYP3A4Poor Metabolizer *22/*22 CYP3A4*22 allele enzyme activity: Reduced CYP3A4*22 allele enzyme activity: Reduced  This genotype is most consistent with the poor metabolizer phenotype. This patient may have reduced enzyme activity as compared to individuals with the normal phenotype.  UGT1A4Ultrarapid Metabolizer *1/*3 UGT1A4*1 allele enzyme activity: Normal UGT1A4*3 allele enzyme activity: Increased  This genotype is most consistent with the ultrarapid metabolizer phenotype. This patient may have increased enzyme  activity as compared to individuals with the normal phenotype.  UGT2B15Intermediate Metabolizer *2/*2 UGT2B15*2 allele enzyme activity: Reduced UGT2B15*2 allele enzyme activity: Reduced  This genotype is most consistent with the intermediate metabolizer phenotype. This patient may have reduced enzyme activity as compared to individuals with the normal phenotype. .res Assessment: Plan:    Courtney [name] was seen today for follow-up.  Diagnoses and all orders for this visit:  Major depressive disorder, recurrent episode, moderate (HCC) -     imipramine (TOFRANIL) 25 MG tablet; Take 3 tablets (75 mg total) by mouth at bedtime.  Generalized anxiety disorder  Attention deficit hyperactivity disorder (ADHD), combined type -     amphetamine-dextroamphetamine (ADDERALL XR) 15 MG 24 hr capsule; Take 1 capsule by mouth every morning.  Other iron deficiency anemia    Greater than 50% of 30 min face to face time with patient was spent on counseling and coordination of care. We discussed  Genesight testing and her desire to take something for anxiety that is in the "Use as Directed" column.   Overall depression is better and anxiety was better too but not gone and worse without lithium.   Overall more irritable than depressed but just with H.  Some chronic anxiety Ongoing poor focus without Aderall Rec she not change meds on her own.  This  has been an issue  Chose TCA bc Genesight test;  disc SE in detail incl cardiac, etc on imipramine 100 mg HS for a week and level was WNL at 238. She agrees to increase slightly to 75 mg HS  Continue lithium CR 300 mg HS bc helped anger and mood.  Will not likely get enough benefit on 150 mg daily so rec tx of tremor.  She has not had adequate trial of propranolol. B6 helping tremor Continue lithium 300 /450 mg QOD.   B12 level checked 496 and normal.  Change Adderall XR to 15 mg daily to provide more consistency.  Helps  productive and helps anxiety and function and depression.  Not currently using , Ativan 1/2 mg tid prn since she is tired from it and not on Adderall usually.    Consider alternatives for anger outbursts, impulsivity like lithium, VPA, atypical.  Constipation management  extensively discussed again and types of fiber.  1.  Lots of water 2.  Powdered fiber supplement such as MiraLAX, Citrucel, etc. preferably with a meal 3.  2 stool softeners a day 4.  Milk of magnesia or magnesium tablets if needed  Supportive therapy around marital crisis and other stressors.  She needs some goals.  Needs to get out of the house chronically.  Started therapy with Danice Goltz.  FU 8-12 weeks  Meredith Staggers, MD, DFAPA   Please see After Visit Summary for patient specific instructions.  Future Appointments  Date Time Provider Department Center  09/11/2022  9:30 AM Plotnikov, Georgina Quint, MD [facility] None  11/12/2022 10:00 AM Cottle, Steva Ready., MD [facility] None     No orders of the defined types were placed in this encounter.     -------------------------------

## 2022-09-11 ENCOUNTER — Ambulatory Visit (INDEPENDENT_AMBULATORY_CARE_PROVIDER_SITE_OTHER): Payer: Medicare Other | Admitting: Internal Medicine

## 2022-09-11 ENCOUNTER — Encounter: Payer: Self-pay | Admitting: Internal Medicine

## 2022-09-11 VITALS — BP 120/78 | HR 87 | Temp 97.8°F | Ht 59.0 in | Wt 118.0 lb

## 2022-09-11 DIAGNOSIS — E785 Hyperlipidemia, unspecified: Secondary | ICD-10-CM | POA: Diagnosis not present

## 2022-09-11 DIAGNOSIS — F324 Major depressive disorder, single episode, in partial remission: Secondary | ICD-10-CM | POA: Diagnosis not present

## 2022-09-11 DIAGNOSIS — Z889 Allergy status to unspecified drugs, medicaments and biological substances status: Secondary | ICD-10-CM

## 2022-09-11 DIAGNOSIS — R1084 Generalized abdominal pain: Secondary | ICD-10-CM

## 2022-09-11 DIAGNOSIS — M059 Rheumatoid arthritis with rheumatoid factor, unspecified: Secondary | ICD-10-CM | POA: Diagnosis not present

## 2022-09-11 NOTE — Assessment & Plan Note (Signed)
Acknowledged. Discussed.

## 2022-09-11 NOTE — Assessment & Plan Note (Signed)
Continue on pravastatin 

## 2022-09-11 NOTE — Assessment & Plan Note (Addendum)
RA is much better on Hydroxychloroquine  F/u w/Dr Nickola Major

## 2022-09-11 NOTE — Progress Notes (Signed)
Subjective:  Patient ID: Courtney [name], female    DOB: 08/20/1950  Age: 72 y.o. MRN: [id number]  CC: Annual Exam (physical)   HPI Courtney [name] presents for a well exam  RA is much better on Hydroxychloroquine   Outpatient Medications Prior to Visit  Medication Sig Dispense Refill   albuterol (VENTOLIN HFA) 108 (90 Base) MCG/ACT inhaler Inhale 2 puffs into the lungs every 4 (four) hours as needed for wheezing or shortness of breath. 18 g 3   amphetamine-dextroamphetamine (ADDERALL XR) 15 MG 24 hr capsule Take 1 capsule by mouth every morning. 30 capsule 0   cyclobenzaprine (FLEXERIL) 10 MG tablet Take 10 mg by mouth as needed.     fluticasone (FLONASE) 50 MCG/ACT nasal spray Place into both nostrils daily.     hydroxychloroquine (PLAQUENIL) 200 MG tablet Take 1 tablet by mouth 2 (two) times daily.     imipramine (TOFRANIL) 25 MG tablet Take 3 tablets (75 mg total) by mouth at bedtime. 270 tablet 0   imipramine (TOFRANIL) 50 MG tablet TAKE 3 TABLETS(150 MG) BY MOUTH AT BEDTIME (Patient taking differently: Take 50 mg by mouth. Taking 50mg  daily) 270 tablet 0   levothyroxine (SYNTHROID) 50 MCG tablet Take 50 mcg by mouth daily before breakfast.     lithium carbonate (LITHOBID) 300 MG ER tablet Take 2 tablets (600 mg total) by mouth at bedtime. 180 tablet 0   LORazepam (ATIVAN) 1 MG tablet Take 1 tablet (1 mg total) by mouth every 8 (eight) hours. (Patient taking differently: Take 1 mg by mouth as needed.) 90 tablet 1   meloxicam (MOBIC) 7.5 MG tablet TAKE 1 TO 2 TABLETS(7.5 TO 15 MG) BY MOUTH DAILY AS NEEDED FOR PAIN 180 tablet 0   omega-3 acid ethyl esters (LOVAZA) 1 g capsule Take 2 capsules (2 g total) by mouth 2 (two) times daily. 120 capsule 11   ondansetron (ZOFRAN-ODT) 4 MG disintegrating tablet Take 1 tablet (4 mg total) by mouth every 8 (eight) hours as needed. 20 tablet 0   pravastatin (PRAVACHOL) 20 MG tablet Take one tablet by mouth 4 days per week (Patient taking  differently: Take 20 mg by mouth daily. Take one tablet by mouth 1-2 days per week) 48 tablet 3   Testosterone 1.62 % GEL Place 5 mg onto the skin daily. 30 g 3   traMADol (ULTRAM) 50 MG tablet TAKE 1/2 TO 1 TABLET(25 TO 50 MG) BY MOUTH BACK EVERY 8 HOURS AS NEEDED FOR SEVERE PAIN 90 tablet 0   triamcinolone cream (KENALOG) 0.5 % Apply 1 Application topically 3 (three) times daily. 30 g 1   vitamin E 400 UNIT capsule Take 800 Units by mouth daily.      nitroGLYCERIN (NITROSTAT) 0.4 MG SL tablet Place 1 tablet (0.4 mg total) under the tongue every 5 (five) minutes as needed for chest pain. (Patient not taking: Reported on 08/13/2021) 25 tablet 2   amoxicillin-clavulanate (AUGMENTIN) 500-125 MG tablet Take 1 tablet by mouth 3 (three) times daily. (Patient not taking: Reported on 09/11/2022) 30 tablet 0   promethazine-dextromethorphan (PROMETHAZINE-DM) 6.25-15 MG/5ML syrup Take 5 mLs by mouth 4 (four) times daily as needed for cough. 240 mL 0   No facility-administered medications prior to visit.    ROS: Review of Systems  Constitutional:  Negative for activity change, appetite change, chills, fatigue and unexpected weight change.  HENT:  Negative for congestion, mouth sores and sinus pressure.   Eyes:  Negative for visual disturbance.  Respiratory:  Negative for cough and chest tightness.   Gastrointestinal:  Negative for abdominal pain and nausea.  Genitourinary:  Negative for difficulty urinating, frequency and vaginal pain.  Musculoskeletal:  Positive for arthralgias, back pain, gait problem, joint swelling, neck pain and neck stiffness.  Skin:  Negative for pallor and rash.  Neurological:  Negative for dizziness, tremors, weakness, numbness and headaches.  Psychiatric/Behavioral:  Positive for dysphoric mood and sleep disturbance. Negative for confusion. The patient is nervous/anxious.     Objective:  BP 120/78 (BP Location: Left Arm, Patient Position: Sitting, Cuff Size: Large)   Pulse  87   Temp 97.8 F (36.6 C) (Oral)   Ht 4\' 11"  (1.499 m)   Wt 118 lb (53.5 kg)   LMP 01/08/1999   SpO2 98%   BMI 23.83 kg/m   BP Readings from Last 3 Encounters:  09/11/22 120/78  07/29/22 110/68  07/20/22 127/80    Wt Readings from Last 3 Encounters:  09/11/22 118 lb (53.5 kg)  07/29/22 119 lb (54 kg)  07/20/22 117 lb 8.1 oz (53.3 kg)    Physical Exam Constitutional:      General: She is not in acute distress.    Appearance: She is well-developed.  HENT:     Head: Normocephalic.     Right Ear: External ear normal.     Left Ear: External ear normal.     Nose: Nose normal.  Eyes:     General:        Right eye: No discharge.        Left eye: No discharge.     Conjunctiva/sclera: Conjunctivae normal.     Pupils: Pupils are equal, round, and reactive to light.  Neck:     Thyroid: No thyromegaly.     Vascular: No JVD.     Trachea: No tracheal deviation.  Cardiovascular:     Rate and Rhythm: Normal rate and regular rhythm.     Heart sounds: Normal heart sounds.  Pulmonary:     Effort: No respiratory distress.     Breath sounds: No stridor. No wheezing.  Abdominal:     General: Bowel sounds are normal. There is no distension.     Palpations: Abdomen is soft. There is no mass.     Tenderness: There is no abdominal tenderness. There is no guarding or rebound.  Musculoskeletal:        General: Tenderness present.     Cervical back: Normal range of motion and neck supple. No rigidity.  Lymphadenopathy:     Cervical: No cervical adenopathy.  Skin:    Findings: No erythema or rash.  Neurological:     Cranial Nerves: No cranial nerve deficit.     Motor: No abnormal muscle tone.     Coordination: Coordination normal.     Deep Tendon Reflexes: Reflexes normal.  Psychiatric:        Behavior: Behavior normal.        Thought Content: Thought content normal.        Judgment: Judgment normal.     Lab Results  Component Value Date   WBC 6.2 08/27/2022   HGB 13.8  08/27/2022   HCT 41.6 08/27/2022   PLT 350.0 08/27/2022   GLUCOSE 88 08/27/2022   CHOL 174 08/27/2022   TRIG 57.0 08/27/2022   HDL 80.20 08/27/2022   LDLDIRECT 120.7 08/04/2012   LDLCALC 82 08/27/2022   ALT 17 08/27/2022   AST 23 08/27/2022   NA 135 08/27/2022   K 4.4 08/27/2022  CL 101 08/27/2022   CREATININE 0.95 08/27/2022   BUN 21 08/27/2022   CO2 29 08/27/2022   TSH 1.33 08/27/2022   INR 1.0 07/06/2020   HGBA1C 5.4 02/11/2018    No results found.  Assessment & Plan:   Problem List Items Addressed This Visit     Dyslipidemia    Continue on pravastatin      Depression    Better with better pain control      RA (rheumatoid arthritis) (HCC) - Primary    RA is much better on Hydroxychloroquine  F/u w/Dr Nickola Major      Generalized abdominal pain    Chronic IBS symptoms, worse after splenic perforation. Splenic subcapsular hematoma compatible with grade 3 splenic injury s/p embolization with IR (12/22)      Multiple drug allergies    Acknowledged.  Discussed         No orders of the defined types were placed in this encounter.     Follow-up: Return in about 6 months (around 03/14/2023) for a follow-up visit.  Sonda Primes, MD

## 2022-09-11 NOTE — Assessment & Plan Note (Signed)
Chronic IBS symptoms, worse after splenic perforation. Splenic subcapsular hematoma compatible with grade 3 splenic injury s/p embolization with IR (12/22)

## 2022-09-11 NOTE — Patient Instructions (Signed)
USEFUL THINGS FOR HAND ARTHRITIS  RICE SOCK HEATING PAD: [email or web address]    SILICONE PADS:    BRIX JAR OPENER:    NITRILE COATED GARDEN GLOVES:   THUMB BRACE:     BLUE EMU CREAM:    

## 2022-09-11 NOTE — Assessment & Plan Note (Signed)
Better with better pain control

## 2022-09-17 DIAGNOSIS — F411 Generalized anxiety disorder: Secondary | ICD-10-CM | POA: Diagnosis not present

## 2022-09-18 ENCOUNTER — Telehealth: Payer: Self-pay | Admitting: Psychiatry

## 2022-09-18 NOTE — Telephone Encounter (Signed)
Pt left message stating increase in tremors and getting warts on her skin. Contact # [phone]

## 2022-09-19 NOTE — Telephone Encounter (Signed)
Patient reporting tremors. When I reviewed last note she said she had not been taking B6 for at least the last 3 days as she ran out. Patient also reporting warts. She describes fluid-filled bumps on her fingers she relates to imipramine. Last seen 5/15, f/u 7/17.  She ? Is she should be taking propanolol.   Chose TCA bc Genesight test;  disc SE in detail incl cardiac, etc on imipramine 100 mg HS for a week and level was WNL at 238. She agrees to increase slightly to 75 mg HS   Continue lithium CR 300 mg HS bc helped anger and mood.  Will not likely get enough benefit on 150 mg daily so rec tx of tremor.  She has not had adequate trial of propranolol. B6 helping tremor Continue lithium 300 /450 mg QOD.    B12 level checked 496 and normal.

## 2022-09-19 NOTE — Telephone Encounter (Signed)
She should take the B6 for her tremor which worked before.  Or she can try propranolol but B6 helped before, 500 mg BID.   She's been on the same imipramine for a long while.  It is not likely to cause exzema on fingers.  She should talk to a dermatologist if this is a problem.

## 2022-09-19 NOTE — Telephone Encounter (Signed)
Patient notified of recommendations. 

## 2022-09-24 DIAGNOSIS — F411 Generalized anxiety disorder: Secondary | ICD-10-CM | POA: Diagnosis not present

## 2022-09-25 DIAGNOSIS — F411 Generalized anxiety disorder: Secondary | ICD-10-CM | POA: Diagnosis not present

## 2022-09-26 DIAGNOSIS — M19211 Secondary osteoarthritis, right shoulder: Secondary | ICD-10-CM | POA: Diagnosis not present

## 2022-09-30 DIAGNOSIS — L814 Other melanin hyperpigmentation: Secondary | ICD-10-CM | POA: Diagnosis not present

## 2022-09-30 DIAGNOSIS — L570 Actinic keratosis: Secondary | ICD-10-CM | POA: Diagnosis not present

## 2022-09-30 DIAGNOSIS — D229 Melanocytic nevi, unspecified: Secondary | ICD-10-CM | POA: Diagnosis not present

## 2022-09-30 DIAGNOSIS — Z7189 Other specified counseling: Secondary | ICD-10-CM | POA: Diagnosis not present

## 2022-10-08 DIAGNOSIS — F411 Generalized anxiety disorder: Secondary | ICD-10-CM | POA: Diagnosis not present

## 2022-10-14 ENCOUNTER — Telehealth: Payer: Self-pay | Admitting: Psychiatry

## 2022-10-14 ENCOUNTER — Other Ambulatory Visit: Payer: Self-pay | Admitting: Psychiatry

## 2022-10-14 DIAGNOSIS — F902 Attention-deficit hyperactivity disorder, combined type: Secondary | ICD-10-CM

## 2022-10-14 MED ORDER — AMPHETAMINE-DEXTROAMPHET ER 15 MG PO CP24
15.0000 mg | ORAL_CAPSULE | ORAL | 0 refills | Status: DC
Start: 2022-10-14 — End: 2022-11-19

## 2022-10-14 NOTE — Telephone Encounter (Signed)
Pt called at 9:34a requesting refill of generic Adderall to   [id number] - [place],  - [address] AT [facility] [address] [place], [place] [address] Phone: [phone]  Fax: [phone]   Next appt 7/17

## 2022-10-15 DIAGNOSIS — F411 Generalized anxiety disorder: Secondary | ICD-10-CM | POA: Diagnosis not present

## 2022-10-22 DIAGNOSIS — M19011 Primary osteoarthritis, right shoulder: Secondary | ICD-10-CM | POA: Diagnosis not present

## 2022-10-23 DIAGNOSIS — F411 Generalized anxiety disorder: Secondary | ICD-10-CM | POA: Diagnosis not present

## 2022-11-12 ENCOUNTER — Ambulatory Visit: Payer: Medicare Other | Admitting: Psychiatry

## 2022-11-19 ENCOUNTER — Telehealth: Payer: Self-pay | Admitting: Psychiatry

## 2022-11-19 ENCOUNTER — Other Ambulatory Visit: Payer: Self-pay

## 2022-11-19 DIAGNOSIS — F902 Attention-deficit hyperactivity disorder, combined type: Secondary | ICD-10-CM

## 2022-11-19 MED ORDER — AMPHETAMINE-DEXTROAMPHET ER 15 MG PO CP24
15.0000 mg | ORAL_CAPSULE | ORAL | 0 refills | Status: DC
Start: 2022-12-17 — End: 2023-01-01

## 2022-11-19 MED ORDER — AMPHETAMINE-DEXTROAMPHET ER 15 MG PO CP24
15.0000 mg | ORAL_CAPSULE | ORAL | 0 refills | Status: DC
Start: 2022-11-19 — End: 2023-01-01

## 2022-11-19 NOTE — Telephone Encounter (Signed)
Pt lvm that she needs a refill on her adderall xr 15 mg. [facility] [place]

## 2022-11-19 NOTE — Telephone Encounter (Signed)
Pended.

## 2022-12-01 DIAGNOSIS — F411 Generalized anxiety disorder: Secondary | ICD-10-CM | POA: Diagnosis not present

## 2022-12-11 DIAGNOSIS — F411 Generalized anxiety disorder: Secondary | ICD-10-CM | POA: Diagnosis not present

## 2022-12-15 DIAGNOSIS — M8588 Other specified disorders of bone density and structure, other site: Secondary | ICD-10-CM | POA: Diagnosis not present

## 2022-12-15 DIAGNOSIS — E039 Hypothyroidism, unspecified: Secondary | ICD-10-CM | POA: Diagnosis not present

## 2022-12-15 DIAGNOSIS — Z79899 Other long term (current) drug therapy: Secondary | ICD-10-CM | POA: Diagnosis not present

## 2022-12-15 DIAGNOSIS — N182 Chronic kidney disease, stage 2 (mild): Secondary | ICD-10-CM | POA: Diagnosis not present

## 2022-12-15 DIAGNOSIS — E538 Deficiency of other specified B group vitamins: Secondary | ICD-10-CM | POA: Diagnosis not present

## 2022-12-15 DIAGNOSIS — F411 Generalized anxiety disorder: Secondary | ICD-10-CM | POA: Diagnosis not present

## 2022-12-15 DIAGNOSIS — M832 Adult osteomalacia due to malabsorption: Secondary | ICD-10-CM | POA: Diagnosis not present

## 2022-12-29 DIAGNOSIS — F411 Generalized anxiety disorder: Secondary | ICD-10-CM | POA: Diagnosis not present

## 2022-12-30 ENCOUNTER — Telehealth: Payer: Self-pay | Admitting: Psychiatry

## 2022-12-30 DIAGNOSIS — F411 Generalized anxiety disorder: Secondary | ICD-10-CM | POA: Diagnosis not present

## 2022-12-30 DIAGNOSIS — F331 Major depressive disorder, recurrent, moderate: Secondary | ICD-10-CM

## 2022-12-30 MED ORDER — IMIPRAMINE HCL 25 MG PO TABS
75.0000 mg | ORAL_TABLET | Freq: Every day | ORAL | 0 refills | Status: DC
Start: 2022-12-30 — End: 2023-01-01

## 2022-12-30 MED ORDER — LITHIUM CARBONATE ER 300 MG PO TBCR
600.0000 mg | EXTENDED_RELEASE_TABLET | Freq: Every evening | ORAL | 0 refills | Status: DC
Start: 2022-12-30 — End: 2023-01-01

## 2022-12-30 NOTE — Telephone Encounter (Signed)
Pt called to request Rfs on each of these: Adderall XR 15mg   Lithium Carbonate 300mg  ER- 90 day Imipramine 50mg  - 90 day [facility] in [place].  [id number] - [place], [place] - [address]  [address], [place] [address]-  Appt 9/5

## 2022-12-30 NOTE — Telephone Encounter (Signed)
Pt will need Refills on  Adderall XR 15mg , Imipramine 50mg  and Lith Carbonate 300mg - Please send to :[id number] - [place], [place] - [address]  [address], [place] [address]

## 2022-12-30 NOTE — Telephone Encounter (Signed)
Patient already had a RF at the pharmacy for Adderall. The other 2 scripts sent - imipramine was 75 mg, not 50.

## 2022-12-31 ENCOUNTER — Telehealth (INDEPENDENT_AMBULATORY_CARE_PROVIDER_SITE_OTHER): Payer: Medicare Other | Admitting: Internal Medicine

## 2022-12-31 ENCOUNTER — Encounter: Payer: Self-pay | Admitting: Internal Medicine

## 2022-12-31 VITALS — BP 130/66 | Ht 59.0 in | Wt 116.0 lb

## 2022-12-31 DIAGNOSIS — U071 COVID-19: Secondary | ICD-10-CM | POA: Diagnosis not present

## 2022-12-31 MED ORDER — NIRMATRELVIR/RITONAVIR (PAXLOVID)TABLET: ORAL_TABLET | ORAL | 0 refills | Status: DC

## 2022-12-31 NOTE — Progress Notes (Signed)
Virtual Visit via Video Note  I connected with Courtney [name] on 12/31/22 at  4:00 PM EDT by a video enabled telemedicine application and verified that I am speaking with the correct person using two identifiers. Location patient: home Location provider:work office Persons participating in the virtual visit: patient, provider  t.  Patient aware  of the limitations of evaluation and management by telemedicine and  availability of in person appointments. and agreed to proceed.   HPI: Courtney [name] presents for video visit PCP dr Posey Rea .  Just began with sx  and very mild fatigue scratchy throat and runny nose  "not that sick"   but contact tole her had covid so tested positve  she has had covid  inf in past and felt worse than this / No fever sob.   Gets reg lab for ? RA on plaquenil  nl gfr last done.  Also saw straw like pices in stool and note sure whta this is  . Whhow to get checked out.    ROS: See pertinent positives and negatives per HPI.  Past Medical History:  Diagnosis Date   ADD (attention deficit disorder)    Anxiety    Celiac disease    possible vs IBS   Complication of anesthesia    Coronary artery disease    Depression    no bipolar per Dr. Nolen Mu   GI problem    Juanda Chance   Gluten intolerance    Hyperlipidemia    IBS (irritable bowel syndrome)    Osteoarthritis    Osteoporosis    Pneumonia    PONV (postoperative nausea and vomiting)    Rheumatoid arthritis (HCC)    Seasonal allergies 01/08/2012   hx. of multiple bronchitis related to this.   Shoulder pain, right     Past Surgical History:  Procedure Laterality Date   EXPLORATORY LAPAROTOMY  01/08/2012   due to endometriosis-portions of both ovaries removed   ROTATOR CUFF REPAIR Right    TONSILLECTOMY     TOTAL HIP ARTHROPLASTY Right    right. 2002   TOTAL HIP ARTHROPLASTY  01/14/2012   Procedure: TOTAL HIP ARTHROPLASTY;  Surgeon: Loanne Drilling, MD;  Location: [facility];   Service: Orthopedics;  Laterality: Left;   TOTAL KNEE ARTHROPLASTY Left 07/16/2020   Procedure: TOTAL KNEE ARTHROPLASTY;  Surgeon: Ollen Gross, MD;  Location: [facility];  Service: Orthopedics;  Laterality: Left;    VENTRAL HERNIA REPAIR  01/08/2012   abdominal    Family History  Problem Relation Age of Onset   Heart disease Mother    Heart attack Mother    Prostate cancer Father    Heart disease Sister    Heart disease Maternal Grandmother    Breast cancer Paternal Grandmother    Colon cancer Paternal Grandfather    Coronary artery disease Other        FH Female 1st degree relative <60   ADD / ADHD Other    Other Daughter        gluten intolerance    Social History   Tobacco Use   Smoking status: Never   Smokeless tobacco: Never   Tobacco comments:    only social  Vaping Use   Vaping status: Never Used  Substance Use Topics   Alcohol use: Yes    Comment: 0-1 per day   Drug use: No      Current Outpatient Medications:    albuterol (VENTOLIN HFA) 108 (90 Base) MCG/ACT inhaler, Inhale 2 puffs into  the lungs every 4 (four) hours as needed for wheezing or shortness of breath., Disp: 18 g, Rfl: 3   amphetamine-dextroamphetamine (ADDERALL XR) 15 MG 24 hr capsule, Take 1 capsule by mouth every morning., Disp: 30 capsule, Rfl: 0   amphetamine-dextroamphetamine (ADDERALL XR) 15 MG 24 hr capsule, Take 1 capsule by mouth every morning., Disp: 30 capsule, Rfl: 0   cyclobenzaprine (FLEXERIL) 10 MG tablet, Take 10 mg by mouth as needed., Disp: , Rfl:    fluticasone (FLONASE) 50 MCG/ACT nasal spray, Place into both nostrils daily., Disp: , Rfl:    hydroxychloroquine (PLAQUENIL) 200 MG tablet, Take 1 tablet by mouth 2 (two) times daily., Disp: , Rfl:    imipramine (TOFRANIL) 25 MG tablet, Take 3 tablets (75 mg total) by mouth at bedtime., Disp: 90 tablet, Rfl: 0   levothyroxine (SYNTHROID) 50 MCG tablet, Take 50 mcg by mouth daily before breakfast., Disp: , Rfl:    lithium  carbonate (LITHOBID) 300 MG ER tablet, Take 2 tablets (600 mg total) by mouth at bedtime., Disp: 60 tablet, Rfl: 0   LORazepam (ATIVAN) 1 MG tablet, Take 1 tablet (1 mg total) by mouth every 8 (eight) hours. (Patient taking differently: Take 1 mg by mouth as needed.), Disp: 90 tablet, Rfl: 1   nirmatrelvir/ritonavir (PAXLOVID) 20 x 150 MG & 10 x 100MG  TABS, Patient GFR is 73 Take nirmatrelvir (150 mg) 2 tablet(s) twice daily for 5 days and ritonavir (100 mg) one tablet twice daily for 5 days.take  Medication together., Disp: 30 tablet, Rfl: 0   omega-3 acid ethyl esters (LOVAZA) 1 g capsule, Take 2 capsules (2 g total) by mouth 2 (two) times daily., Disp: 120 capsule, Rfl: 11   ondansetron (ZOFRAN-ODT) 4 MG disintegrating tablet, Take 1 tablet (4 mg total) by mouth every 8 (eight) hours as needed., Disp: 20 tablet, Rfl: 0   pravastatin (PRAVACHOL) 20 MG tablet, Take one tablet by mouth 4 days per week (Patient taking differently: Take 20 mg by mouth daily. Take one tablet by mouth 1-2 days per week), Disp: 48 tablet, Rfl: 3   Testosterone 1.62 % GEL, Place 5 mg onto the skin daily., Disp: 30 g, Rfl: 3   traMADol (ULTRAM) 50 MG tablet, TAKE 1/2 TO 1 TABLET(25 TO 50 MG) BY MOUTH BACK EVERY 8 HOURS AS NEEDED FOR SEVERE PAIN, Disp: 90 tablet, Rfl: 0   vitamin E 400 UNIT capsule, Take 800 Units by mouth daily. , Disp: , Rfl:    nitroGLYCERIN (NITROSTAT) 0.4 MG SL tablet, Place 1 tablet (0.4 mg total) under the tongue every 5 (five) minutes as needed for chest pain. (Patient not taking: Reported on 08/13/2021), Disp: 25 tablet, Rfl: 2   triamcinolone cream (KENALOG) 0.5 %, Apply 1 Application topically 3 (three) times daily. (Patient not taking: Reported on 12/31/2022), Disp: 30 g, Rfl: 1  EXAM: BP Readings from Last 3 Encounters:  12/31/22 130/66  09/11/22 120/78  07/29/22 110/68    VITALS per patient if applicable:  GENERAL: alert, oriented, appears well and in no acute distress  HEENT: atraumatic,  conjunttiva clear, no obvious abnormalities on inspection of external nose and ears Mild congestion  non toxic looks well nl color  NECK: normal movements of the head and neck  LUNGS: on inspection no signs of respiratory distress, breathing rate appears normal, no obvious gross SOB, gasping or wheezing  CV: no obvious cyanosis  MS: moves all visible extremities without noticeable abnormality  PSYCH/NEURO: pleasant and cooperative, no obvious depression  or anxiety, speech and thought processing grossly intact Lab Results  Component Value Date   WBC 6.2 08/27/2022   HGB 13.8 08/27/2022   HCT 41.6 08/27/2022   PLT 350.0 08/27/2022   GLUCOSE 88 08/27/2022   CHOL 174 08/27/2022   TRIG 57.0 08/27/2022   HDL 80.20 08/27/2022   LDLDIRECT 120.7 08/04/2012   LDLCALC 82 08/27/2022   ALT 17 08/27/2022   AST 23 08/27/2022   NA 135 08/27/2022   K 4.4 08/27/2022   CL 101 08/27/2022   CREATININE 0.95 08/27/2022   BUN 21 08/27/2022   CO2 29 08/27/2022   TSH 1.33 08/27/2022   INR 1.0 07/06/2020   HGBA1C 5.4 02/11/2018  Record review  gfr 73 last check   ASSESSMENT AND PLAN:  Discussed the following assessment and plan:    ICD-10-CM   1. COVID-19 virus infection  U07.1     Apparently so far mild uri sx   Counseled.  Options's discussed immuniz covid x 2 and hx of infection .  Risk for complications are age and RA on plaquenil but very mild sx .  Vascular  Options to add antivirals .  Risk benefit  Will send to pharmacy and she can decide on rx within 5 days begin and go from there   Seek care with alarm sx .   Expectant management and discussion of plan and treatment with opportunity to ask questions and all were answered. The patient agreed with the plan and demonstrated an understanding of the instructions.   Advised to call back or seek an in-person evaluation if worsening  or having  further concerns  in interim. Return if symptoms worsen or fail to improve as expected, for  send info or check with pcp about stool abnormality.   Berniece Andreas, MD

## 2023-01-01 ENCOUNTER — Ambulatory Visit (INDEPENDENT_AMBULATORY_CARE_PROVIDER_SITE_OTHER): Payer: Medicare Other | Admitting: Psychiatry

## 2023-01-01 ENCOUNTER — Encounter: Payer: Self-pay | Admitting: Psychiatry

## 2023-01-01 DIAGNOSIS — G251 Drug-induced tremor: Secondary | ICD-10-CM

## 2023-01-01 DIAGNOSIS — F902 Attention-deficit hyperactivity disorder, combined type: Secondary | ICD-10-CM | POA: Diagnosis not present

## 2023-01-01 DIAGNOSIS — F411 Generalized anxiety disorder: Secondary | ICD-10-CM

## 2023-01-01 DIAGNOSIS — F331 Major depressive disorder, recurrent, moderate: Secondary | ICD-10-CM | POA: Diagnosis not present

## 2023-01-01 MED ORDER — IMIPRAMINE HCL 50 MG PO TABS
50.0000 mg | ORAL_TABLET | Freq: Every day | ORAL | 1 refills | Status: DC
Start: 2023-01-01 — End: 2023-07-08

## 2023-01-01 MED ORDER — AMPHETAMINE-DEXTROAMPHETAMINE 7.5 MG PO TABS
7.5000 mg | ORAL_TABLET | Freq: Every day | ORAL | 0 refills | Status: DC
Start: 2023-01-01 — End: 2023-05-07

## 2023-01-01 MED ORDER — AMPHETAMINE-DEXTROAMPHET ER 15 MG PO CP24: 15.0000 mg | ORAL_CAPSULE | ORAL | 0 refills | Status: DC

## 2023-01-01 MED ORDER — LITHIUM CARBONATE ER 450 MG PO TBCR
450.0000 mg | EXTENDED_RELEASE_TABLET | Freq: Every evening | ORAL | 0 refills | Status: DC
Start: 2023-01-01 — End: 2023-05-07

## 2023-01-01 NOTE — Progress Notes (Signed)
Courtney [name] [id number] [birth year] 72 y.o.  Virtual Visit via Telephone Note  I connected with pt by telephone and verified that I am speaking with the correct person using two identifiers.   I discussed the limitations, risks, security and privacy concerns of performing an evaluation and management service by telephone and the availability of in person appointments. I also discussed with the patient that there [name] be a patient responsible charge related to this service. The patient expressed understanding and agreed to proceed.  I discussed the assessment and treatment plan with the patient. The patient was provided an opportunity to ask questions and all were answered. The patient agreed with the plan and demonstrated an understanding of the instructions.   The patient was advised to call back or seek an in-person evaluation if the symptoms worsen or if the condition fails to improve as anticipated.  I provided 30 minutes of non-face-to-face time during this encounter. The call started at 940 and ended at 1010. The patient was located at home with Covid and the provider was located office.   Subjective:   Patient ID:  Courtney [name] is a 72 y.o. (DOB Apr 24, 1951) female.  Chief Complaint:  Chief Complaint  Patient presents with   Follow-up   Depression   Anxiety   Stress     Courtney [name] presents to the office today for follow-up of first visit 03/01/2020 referred by a friend Courtney [name].  Was prescribed Viibryd and Vyvanse was changed to Concerta 54 mg to try to reduce the amount of dry mouth.  05/02/2020 appointment with the following noted: Less dry mouth with Concerta but didn't seem to last beyond 3-4 PM. Viibryd 20 mg daily.  Hard to get enough calories at breakfast.  But has been taking both in the morning.  Some benefit with Viibryd. Is there something I can take besides a stimulant, bc fears it is stressing her body.  Dx college exhausted adrenal glands.   It helps.  Asked questions about alternatives to stimulants.  Reduced Concerta to reduce anxiety and see if can achieve, but she thinks it's worse in the evening. Anxiety is still a struggle.  Life crisis moment.  Chronic marital dissatisfaction worse now that both are retired.  Feels like she's in a prison.  Family notices she's stressed.  Has done therapy since age 49 yo.  Has done Hindu meditation without help.  Christian faith disciplines.  Grew up caretaking.  I'm done and I need to change.  Goal is calm down enough to function through the situation. Tingling foot and wants B12 testing. Plan: Switch Viibryd to 20 mg in evening meal to get better absorption.  06/18/2020 appointment with following noted: No difference in anxiety or mood with Viibryd 20 mg daily.  She doesn't want to increase it. Questions about Viibryd and Genesight testing.  Asks about differences between Adderall and MPH.   Missed for 3 days Concerta and took Adderall.    Tired by 4-5 PM and doesn't like that.    Plan: She wants to wean off the Viibryd because she does not feel it has been helpful and she does not want to increase the dosage.  07/03/2020 phone call patient stating she wanted to continue Viibryd 20 mg daily.  She also requested refill of Concerta 54 mg  08/14/2020 appointment with the following noted: Was told she couldn't take Viibryd with one of the pain meds and had TKR.  So stopped it.  Couldn't tolerate pain meds except tramadol.  Had more pain than expected.   Stopped Viibryd about 07/15/20.  She had taken tramadol with Viibryd in the past without a problem.   Noticing more pain after surgery in non-surgical places. Seems to be sensitive to getting lightheaded and confused with low blood sugar.   Poor attention and scattered since surgery.  Also more tired. Occ taken tramadol and otherwise just Tylenol. Impossible to tell the effect of the Viibryd given she hasn't been herself for months. Concerned about  H's Courtney [name]'s memory who is also a patient here.  Wants him to get neuropsych testing. Plan because patient is med sensitive we will start very low-dose fluvoxamine 25 mg nightly Per her request continue Concerta 54 mg every morning  10/25/2020 appointment with the following noted: Several phone calls since being here.  The first led to increasing fluvoxamine to 1-1/2 of the 25 mg tablets due to lack of effect at 3 weeks. 09/21/2020 she called asking to stop Concerta which she did. She called again wanting to start Adderall.  Because she had taken it in 2019 it was agreed that she could start Adderall 10 mg twice daily. Stress dealing with husband and whether to move or not.  [name] separate.  Anxiety is very high.  Hard to calm down around him. $ stress.  Explodes with anger at husband.  Doesn't think he can sell the house on his own. Never been this stressed and anxious and in this kind of circumstance before. Never got the Adderall DT need for PA. Only caffeine in the AM Plan: Rec increase Luvox from 37.5 mg daily to 50 mg HS for a week then increase to 75 mg daily and possibly higher.  B12 level checked 496 and normal. Hold Adderall until the anxiety is under control.   12/03/2020 phone call from patient: Patient called stating fluvoxamine made her more anxious and high strung and she stopped it.  She wanted an earlier appointment which was not available at the time she was given the option to see a nurse practitioner but refused.  12/19/2020 appointment with the following noted: Life very stressful right now and not getting better.  B in law died and marriage falling apart. Separating.   More than I can handle including anxiety and depression. Plan: Rec trial beta blocker propranolol 10-30 mg  twice daily for anxiety Consider TCA bc Genesight test  B12 level checked 496 and normal. Hold Adderall until the anxiety is under control.   01/01/2021 phone call from patient with nurse as follows: She is  taking up to 30 mg propranolol twice daily without sufficient benefit for her anxiety.  01/03/2021 appointment with the following noted: Rare Adderall.  Propranolol didn't help anxiety much at 30 mg BID without SE.[name]   Thinks she's gotten depressed which is unusual.  In a perfect storm with divorce and moving and financial stress.  Not functioning well.  Hard to make a decision.  Poor productivity.   Having a lot of pain, chronically and worse lately. Starting Lyrica today. Taking alprazolam about 0.5 mg daily. Plan: Yes imipramine 25 and increase to 75 mg HS and then check blood level  02/26/21 appt noted: 01/21/2021 serum imipramine and desipramine total was 65 at 75 mg daily. It has made a difference.  But has made so many changes and moving.  Stress goes to GI system.   Was in ER last night with GI px and [name] have ulcer.  Unable to eat without nausea.  Lump in the  throat nausea. Mild taking the edge off.  Can still explode. Can't remember the SE at 100 mg daily. GI dominating with pain and nausea. Sleep varies from good or bothered by sickness. Usually fine but sometimes can't turn her brain off.  Moves in 5 days. Plan: Continue imipramine and increase to 100 mg again as soon as nausea managed. Add risperidone 1 mg HS off label for nausea and anxiety.  03/28/2021 appt noted: She is not taking the risperidone.  Tried it for a couple of weeks but did not help the nausea.  Didn't notice it helping anxiety but had tremor.  Off for 2 weeks and tremor better. Still on imipramine 75 mg nightly.  She did increase but wasn't sure it was causing the tremor as instructed. This week to GI and work up ordered and doubled med dose. Not handling the transition well.  Very irritable with husband.  Such a sense of urgency.   Not often with Xanax.   Not on Adderall.   Plan: imipramine and increase to 100 mg nightly.  B12 level checked 496 and normal. Hold Adderall until the anxiety is under control.   Switch Xanax to Ativan 1 mg tid since she is not on Adderall.    05/02/2021 appt noted: Increased imipramine 100 mg HS. Tolerated the increase Spleen bleed. Yesterday decent energy compared to what she had.  Ativan made her sleepy but only took it once. Sleeping a lot DT spleen injury. Can't tell about depression bc of injury kept her in bed.  Has to move to feel normal. Plan: imipramine and increase to 150 mg nightly, based on prior level.  05/29/21 appt noted: SE dry mouth Increased imipramine 150 mg HS Seen improvement in mood.  Meeting irritating situations with less response.  Better self control.  Less fear and anxiety. Ativan 1 mg prn makes her too sleepy and tired. Occ taken 1/2 Adderall with some energy and motivation.   GI work up ongoing with gastric emptying yesterday.  It's delayed ongoing.  GI doctor has commented on her hostility.   Sleeping really well.   Worst dep and anxiety was when moving and now 50% better.  Need to have some goals and since moving into townhouse and unknown what is next.  [name] move out of town to be near the kids at part of the year but it's too cold.  Big decisions are hard. Difficulty with back pain so hasn't unpacked.   More hopeful about her health. Seasonal depression Plan: Imipramine is helpful but increase could be more helpful based on level.  Disc pros and cons on increase.  She wants to increase it..  Consider lithium augmentation. Increase imipramine to 200 mg nightly, based on prior level Disc light therapy in detail and gave handout.    Adderall prn. Reduce Ativan 1/2 mg tid prn since she is tired from it and not on Adderall usually.    07/05/2021 phone call complaining of dry mouth with imipramine.  Frustrating because of reportedly low sodium and was told to drink less water. MD response:We increased the imipramine from 3 of the 50 mg tablets to 4 of the 50 mg tablets on February 1.  If she has seen additional improvement in her mood it  is to her advantage to continue the current dosage.  If she has not seen additional improvement since increasing the dose she can drop the imipramine back to 3 tablets daily. In addition to that she should discuss the dry  mouth problem with her pharmacist as the pharmacist [name] have some other recommendations to help manage it.  But the usual recommendation is to switch her toothpaste to Biotene toothpaste and use the Biotene mouthwash and Biotene spray to manage the dry mouth as best she can. Patient response:Notified patient. She said the issue is more complex than just dry mouth. She has been having GI issues and has been very sick, had a splenic bleed, is unable to get out of bed at times. Dr[facility] Shawn Stall at [facility] is her GI doctor. Patient spoke to her after Gloris Manchester talked to her today and is wanting her to stop the imipramine to eliminate that as a source of her issues.   MD response:Okay she can come off the imipramine better mood anxiety and irritability will get worse off of it because it helped her.  However we cannot start any new medicines until we see if her physical symptoms are any different off the medicine.  She can reduce imipramine by 1 tablet every 3 to 4 days.  The dry mouth will not resolve until she is off the medicine.  The splenic bleed is not related to imipramine unless she has some bizarre sort of allergy.  08/01/21 appt noted: Micah Flesher totally off imipramine for a week and GI px not better and restarted imipramine to 100 mg HS DT chronic constipation and then this week increased to 150 just this week.   Therapy with Vikki Ports helping a lot. Overall depression and anxiety is better. Trying to find endo.  Medically cleared from splenic bleed but doesn't think it is fine. Episode of abd paine with result in bed 5 days but back out of bed and functioning again. Hasn't needed lorazepam lately. Recognizes still angry and intense too much .   Plan check level and add lithium 300  10/07/21 appt  noted; Taboo about lithium and didn't take it. Irritability is still focused and directed at H and not sure how it would be wihtout him.  Trying to address this with therapy and faith and prayer.   Seeing Danice Goltz.   Everything fuels my fire.  Erupting this is never gonna change rage.  Recognizes this part is her problem.  Gets upset with him not doing things she wants done. Body getting stronger since Xmas and more she can do the better her depression.   Still some stiffness with pain in spine and leg in AM and then gets better. Not seriously depressed if can move. Don't handle inactivity well.  Able to do more now though has helped. Can't control her reaction to Trey Paula though knows it is over the top since she retired. Intermittent nausea with activity causes her to go to bed.  Waves of pain with it.  Frustrated cause is unknown. Plan: Continue imipramine 150 mg HS for a week and level was WNL at 238. Augment with lithium CR 300 mg HS for anger and mood  01/22/22 appt noted: Currently take imipramine 100 mg HS and lithium CR 300 mg HS, Adderall 10 BID Reduced imipramine to help tremor and it is some better.  Occ tremor, positional.  Mood is a lot better with lithium.  Right away clearing of haze and calming down.  Better outlook and been like that since then. Questions about effects on organs.   Sleep well.   Not sig anxious now.  Occ moments of anxiety.   Asks if could stop imipramine bc benefit with lithium.  04/10/22 appt noted: Currently stopped  imipramine 100 mg HS (2 days ago) and taking lithium CR 300 mg HS, Adderall 10 BID Good but have to stop the imipramine bc of the shaking.  She cut dose to 50 mg QOD. Tremor worse with R hand.  R handed.  Feels like lithium helped She wants to see what mood is like without imipramine.  Less tremor with less imipramine. Tremor is better with less right now. [name] need shoulder replacement DT pain.  Also needs spinal fusion DT dropped  foot. Hard to talk about it bc so dreads it.   Irritability is there and when ignited is bad but working on it in therapy.    Been angry my whole life.  Back to trauma in childhood.  Overall though better with lithium.   Miralax helped GI problems but waves of ok and then GI px.   Needs Adderall daily. Takes Ativan just occ with stressful circumstances. Plan: on imipramine 100 mg HS for a week and level was WNL at 238. She stopped it this week and disc risk of relapse of dep but she hopes lithium will keep dep at bay Continue lithium CR 300 mg HS bc helped anger and mood  06/17/22 appt noted: recently taking 1/2 lithium 300 and imipramine 50 mg HS for 4-5 days and tremor better with less lithium. CO tremor;   irritates her and makes her feel bad about herself.  Never really tried more 10 mg propranolol for tremor. More pain in shoulder.   Not sure if any change in explosive irritation at H.  Not sure if it is her H or bc of the relationship. Depression is not that bad in the last month and no worse with less imipramine.   Would like to put a spacer between triggers and what she says to H .  Not good.   When in [place] 2 yr ago for 2 mos alone but still had underlying anger problems. Plan: no med changes  07/14/22 appt noted: Increased lithium back to 1 and 1/2 tablet for a month.   Till has it in R hand esp.  Uses propranolol sometimes. Only tried it 4 times. An on impramine 50 mg HS imipramine 100 mg HS for a week and level was WNL at 238. Mood not bad at all  Irritability ?  Better with more lithium.  09/10/22 appt noted: Chronic procrastination unless workig job. Mood is not bad except triggered by H.  No change with increase lithium.  B6 400 BID has reduced and managed tremor. Almost entirely stopped Adderall bc GI sx if not taken with food.  Gut never normal since spleen issue.  Feels something wrong with GI tract.  Cycles of constipation and N and then abd cramping for days at time and  then it will be ok for awhile.   Psych meds imipramine 50 mg HS, lithium 300/450 QOD, Adderall 10 BID prn, lorazepam 1 mg prn (none lately, I don't like to be slowed down). Taking miralax every other day.   Anxiety is not unusuallly high  no panic but some chronic anxiety. Sleep well at night and needs nap if not 8 hours. More postive when on Adderall re: mood and better focused and task completion.    01/01/23 appt noted: Psych meds: Adderall XR 15, cyclobenzaprine 10 mg as needed in the AM for tremor, imipramine 50 mg nightly, lithium ER 450 mg nightly. (Never increased), lorazepam rarely She reduced imipramine to 50 bc she is convinced it is causing  the shaking.  Not very much tremor but some postural.   Mood is ok.  I'm happy.   Has Covid.  Day 4-5 of sx, pretty mild.    Past Psychiatric Medication Trials: Trintellix NR, sertraline?,  Viibryd 20, remote zoloft, Paxil, Cymbalta couple days ? Adverse reaction Imipramine 150 better imipramine 100 mg HS for a week and level was WNL at 238. Fluvoxamine SE anxiety but ? Adequacy of trial bc stress Fluvoxamine 100 in 2015 per Dr. Nolen Mu Lithium 300 helpful  Gabapentin 1600 NR Lyrica  Risperidone 1 tremor  Vyvanse, Adderall, concerta  DC propranolol 10-30 mg  twice daily for anxiety bc not helpful, lorazepam History Genesight History of Dr. Nolen Mu and Deatra Robinson  Started therapy at 73 yo bc fear mother would die any minute bc she had cardiac px.  Review of Systems:  Review of Systems  Cardiovascular:  Negative for chest pain.  Gastrointestinal:  Positive for abdominal pain and nausea.  Musculoskeletal:  Positive for arthralgias and back pain.  Neurological:  Positive for dizziness and tremors. Negative for weakness.  Psychiatric/Behavioral:  Positive for dysphoric mood. Negative for agitation, confusion, decreased concentration and hallucinations. The patient is nervous/anxious.     Medications: I have reviewed the patient's  current medications.  Current Outpatient Medications  Medication Sig Dispense Refill   albuterol (VENTOLIN HFA) 108 (90 Base) MCG/ACT inhaler Inhale 2 puffs into the lungs every 4 (four) hours as needed for wheezing or shortness of breath. 18 g 3   amphetamine-dextroamphetamine (ADDERALL) 7.5 MG tablet Take 1 tablet by mouth daily. 30 tablet 0   cyclobenzaprine (FLEXERIL) 10 MG tablet Take 10 mg by mouth as needed.     fluticasone (FLONASE) 50 MCG/ACT nasal spray Place into both nostrils daily.     hydroxychloroquine (PLAQUENIL) 200 MG tablet Take 1 tablet by mouth 2 (two) times daily.     levothyroxine (SYNTHROID) 50 MCG tablet Take 50 mcg by mouth daily before breakfast.     LORazepam (ATIVAN) 1 MG tablet Take 1 tablet (1 mg total) by mouth every 8 (eight) hours. (Patient taking differently: Take 1 mg by mouth as needed.) 90 tablet 1   omega-3 acid ethyl esters (LOVAZA) 1 g capsule Take 2 capsules (2 g total) by mouth 2 (two) times daily. 120 capsule 11   ondansetron (ZOFRAN-ODT) 4 MG disintegrating tablet Take 1 tablet (4 mg total) by mouth every 8 (eight) hours as needed. 20 tablet 0   pravastatin (PRAVACHOL) 20 MG tablet Take one tablet by mouth 4 days per week (Patient taking differently: Take 20 mg by mouth daily. Take one tablet by mouth 1-2 days per week) 48 tablet 3   Testosterone 1.62 % GEL Place 5 mg onto the skin daily. 30 g 3   traMADol (ULTRAM) 50 MG tablet TAKE 1/2 TO 1 TABLET(25 TO 50 MG) BY MOUTH BACK EVERY 8 HOURS AS NEEDED FOR SEVERE PAIN 90 tablet 0   triamcinolone cream (KENALOG) 0.5 % Apply 1 Application topically 3 (three) times daily. 30 g 1   vitamin E 400 UNIT capsule Take 800 Units by mouth daily.      amphetamine-dextroamphetamine (ADDERALL XR) 15 MG 24 hr capsule Take 1 capsule by mouth every morning. 30 capsule 0   [START ON 01/29/2023] amphetamine-dextroamphetamine (ADDERALL XR) 15 MG 24 hr capsule Take 1 capsule by mouth every morning. 30 capsule 0   imipramine  (TOFRANIL) 50 MG tablet Take 1 tablet (50 mg total) by mouth at bedtime. 90 tablet  1   lithium carbonate (ESKALITH) 450 MG ER tablet Take 1 tablet (450 mg total) by mouth at bedtime. 90 tablet 0   nirmatrelvir/ritonavir (PAXLOVID) 20 x 150 MG & 10 x 100MG  TABS Patient GFR is 73 Take nirmatrelvir (150 mg) 2 tablet(s) twice daily for 5 days and ritonavir (100 mg) one tablet twice daily for 5 days.take  Medication together. (Patient not taking: Reported on 01/01/2023) 30 tablet 0   nitroGLYCERIN (NITROSTAT) 0.4 MG SL tablet Place 1 tablet (0.4 mg total) under the tongue every 5 (five) minutes as needed for chest pain. (Patient not taking: Reported on 08/13/2021) 25 tablet 2   No current facility-administered medications for this visit.    Medication Side Effects: Other: dry mouth  Allergies:  Allergies  Allergen Reactions   Clarithromycin Nausea Only   Covid-19 (Mrna) Vaccine Other (See Comments)    Acute Vasculitis; excessive bleeding  Other reaction(s): Other (See Comments) Acute Vasculitis; excessive bleeding    Other Other (See Comments)   Bactrim [Sulfamethoxazole-Trimethoprim] Nausea And Vomiting   Morphine Nausea And Vomiting    Other reaction(s): Nausea/Vomiting   Morphine And Codeine Nausea And Vomiting   Azithromycin Nausea And Vomiting and Nausea Only   Ceclor [Cefaclor] Nausea And Vomiting    Can take Augmentin ok   Claritin [Loratadine]     REACTION: bruises   Codeine Nausea And Vomiting    Can take Tramadol ok   Cymbalta [Duloxetine Hcl]    Erythromycin Nausea And Vomiting    Other reaction(s): Nausea/Vomiting   Erythromycin Base Nausea And Vomiting   Levofloxacin Nausea Only    REACTION: nausea   Lipitor [Atorvastatin]     Other reaction(s): Myalgias (intolerance)   Nitrofurantoin Nausea Only   Oxycodone Other (See Comments)   Penicillins Nausea And Vomiting    Can take Augmentin   Propoxyphene Nausea Only    dizzy   Propoxyphene N-Acetaminophen Nausea Only     dizzy    Past Medical History:  Diagnosis Date   ADD (attention deficit disorder)    Anxiety    Celiac disease    possible vs IBS   Complication of anesthesia    Coronary artery disease    Depression    no bipolar per Dr. Nolen Mu   GI problem    Juanda Chance   Gluten intolerance    Hyperlipidemia    IBS (irritable bowel syndrome)    Osteoarthritis    Osteoporosis    Pneumonia    PONV (postoperative nausea and vomiting)    Rheumatoid arthritis (HCC)    Seasonal allergies 01/08/2012   hx. of multiple bronchitis related to this.   Shoulder pain, right     Family History  Problem Relation Age of Onset   Heart disease Mother    Heart attack Mother    Prostate cancer Father    Heart disease Sister    Heart disease Maternal Grandmother    Breast cancer Paternal Grandmother    Colon cancer Paternal Grandfather    Coronary artery disease Other        FH Female 1st degree relative <60   ADD / ADHD Other    Other Daughter        gluten intolerance    Social History   Socioeconomic History   Marital status: Married    Spouse name: Not on file   Number of children: 2   Years of education: Not on file   Highest education level: Not on file  Occupational History  Occupation: Network engineer  Tobacco Use   Smoking status: Never   Smokeless tobacco: Never   Tobacco comments:    only Financial trader   Vaping status: Never Used  Substance and Sexual Activity   Alcohol use: Yes    Comment: 0-1 per day   Drug use: No   Sexual activity: Yes  Other Topics Concern   Not on file  Social History Narrative   Married for last 42 years.Lives with husband.Retired Human resources officer.Originally from [place].   Social Determinants of Health   Financial Resource Strain: Low Risk  (02/27/2022)   Overall Financial Resource Strain (CARDIA)    Difficulty of Paying Living Expenses: Not hard at all  Food Insecurity: No Food Insecurity (02/27/2022)   Hunger Vital Sign     Worried About Running Out of Food in the Last Year: Never true    Ran Out of Food in the Last Year: Never true  Transportation Needs: No Transportation Needs (02/27/2022)   PRAPARE - Administrator, Civil Service (Medical): No    Lack of Transportation (Non-Medical): No  Physical Activity: Sufficiently Active (02/27/2022)   Exercise Vital Sign    Days of Exercise per Week: 5 days    Minutes of Exercise per Session: 30 min  Stress: No Stress Concern Present (02/27/2022)   [employer] of Occupational Health - Occupational Stress Questionnaire    Feeling of Stress : Not at all  Social Connections: Socially Integrated (02/27/2022)   Social Connection and Isolation Panel [NHANES]    Frequency of Communication with Friends and Family: More than three times a week    Frequency of Social Gatherings with Friends and Family: More than three times a week    Attends Religious Services: 1 to 4 times per year    Active Member of [employer] or Organizations: Yes    Attends Banker Meetings: 1 to 4 times per year    Marital Status: Married  Catering manager Violence: Not At Risk (02/27/2022)   Humiliation, Afraid, Rape, and Kick questionnaire    Fear of Current or Ex-Partner: No    Emotionally Abused: No    Physically Abused: No    Sexually Abused: No    Past Medical History, Surgical history, Social history, and Family history were reviewed and updated as appropriate.   Please see review of systems for further details on the patient's review from today.   Objective:   Physical Exam:  LMP 01/08/1999   Physical Exam Neurological:     Mental Status: She is alert and oriented to person, place, and time.     Cranial Nerves: No dysarthria.  Psychiatric:        Attention and Perception: Attention and perception normal.        Mood and Affect: Mood is anxious. Mood is not depressed. Affect is not tearful.        Speech: Speech normal. Speech is not rapid and pressured.         Behavior: Behavior is cooperative.        Thought Content: Thought content normal. Thought content is not paranoid or delusional. Thought content does not include homicidal or suicidal ideation. Thought content does not include suicidal plan.        Cognition and Memory: Cognition and memory normal.        Judgment: Judgment normal.     Comments: Insight intact     Lab Review:     Component Value Date/Time  NA 135 08/27/2022 0940   NA 134 06/14/2018 1444   K 4.4 08/27/2022 0940   CL 101 08/27/2022 0940   CO2 29 08/27/2022 0940   GLUCOSE 88 08/27/2022 0940   BUN 21 08/27/2022 0940   BUN 10 06/14/2018 1444   CREATININE 0.95 08/27/2022 0940   CREATININE 0.76 04/10/2020 1431   CALCIUM 10.0 08/27/2022 0940   PROT 6.6 08/27/2022 0940   PROT 6.5 12/19/2020 0754   ALBUMIN 4.0 08/27/2022 0940   ALBUMIN 4.5 12/19/2020 0754   AST 23 08/27/2022 0940   ALT 17 08/27/2022 0940   ALKPHOS 53 08/27/2022 0940   BILITOT 0.3 08/27/2022 0940   BILITOT 0.4 12/19/2020 0754   GFRNONAA 60 (L) 04/17/2021 1825   GFRNONAA 79 04/10/2020 1431   GFRAA 91 04/10/2020 1431       Component Value Date/Time   WBC 6.2 08/27/2022 0940   RBC 4.36 08/27/2022 0940   HGB 13.8 08/27/2022 0940   HCT 41.6 08/27/2022 0940   PLT 350.0 08/27/2022 0940   MCV 95.5 08/27/2022 0940   MCH 31.4 04/17/2021 1825   MCHC 33.0 08/27/2022 0940   RDW 13.7 08/27/2022 0940   LYMPHSABS 1.4 08/27/2022 0940   MONOABS 0.5 08/27/2022 0940   EOSABS 1.0 (H) 08/27/2022 0940   BASOSABS 0.1 08/27/2022 0940    No results found for: "POCLITH", "LITHIUM"   No results found for: "PHENYTOIN", "PHENOBARB", "VALPROATE", "CBMZ"   06/04/20 B12 normal at 496  01/21/2021 imipramine level 25, desipramine level 40 equals total 65 which is low on 75 mg daily. 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 They have one copy of the C allele and one copy of the G allele. This genotype suggests a normal response to certain ADHD medications.  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 This genotype suggests a lower risk of serious hypersensitivity reactions, including Stevens-Johnson syndrome (SJS), toxic epidermal necrolysis (TEN), maculopapular eruptions, and Drug Reaction with Eosinophilia and Systemic Symptoms when taking certain mood stabilizers.  HLA-B*1502Lower Risk Not Present This patient does not carry the HLA-B*1502 allele or a closely related *15 allele. Absence of HLA-B*1502 and the closely related *15 alleles suggests lower risk of serious dermatologic reactions including toxic epidermal necrolysis (TEN) and Stevens-Johnson syndrome (SJS) when taking certain mood stabilizers.  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 They carry two copies of the G allele. This genotype has been associated with an increased risk of adverse drug reactions with certain selective serotonin reuptake inhibitors.  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 The short promoter allele is reported to decrease expression of the serotonin transporter compared to the homozygous long promoter allele. The patient [name] have a moderately decreased likelihood of response to certain selective serotonin reuptake inhibitors due to the presence of the short form of the gene.  Pharmacokinetic Genes PK CES1A1Extensive (Normal) Metabolizer GLY/GLY CES1A1 - Gly allele enzyme activity: Normal CES1A1 - Gly allele enzyme activity: Normal  This genotype is most consistent with the  extensive (normal) metabolizer phenotype.  The patient is expected to have normal enzyme activity.  CYP1A2Extensive (Normal) Metabolizer *1/*1 This genotype is most consistent with  the extensive (normal) metabolizer phenotype.  CYP2B6Poor Metabolizer *6/*6 CYP2B6*6 allele enzyme activity: Reduced CYP2B6*6 allele enzyme activity: Reduced  This genotype is most consistent with the poor metabolizer phenotype. This patient [name] have reduced enzyme activity as compared to individuals with the normal phenotype.  CYP2C19Extensive (Normal) Metabolizer *1/*1 CYP2C19*1 allele enzyme activity: Normal CYP2C19*1 allele enzyme activity: Normal  This genotype is most consistent with the extensive (normal) metabolizer phenotype.  CYP2C9Intermediate Metabolizer *1/*2 CYP2C9*1 allele enzyme activity: Normal CYP2C9*2 allele enzyme activity: Reduced  This genotype is most consistent with the intermediate metabolizer phenotype. This patient [name] have reduced enzyme activity as compared to individuals with the normal phenotype.  CYP2D6Extensive (Normal) Metabolizer *1/*41 CYP2D6*1 allele enzyme activity: Normal CYP2D6*41 allele enzyme activity: Reduced  This genotype is most consistent with the extensive (normal) metabolizer phenotype.  CYP3A4Poor Metabolizer *22/*22 CYP3A4*22 allele enzyme activity: Reduced CYP3A4*22 allele enzyme activity: Reduced  This genotype is most consistent with the poor metabolizer phenotype. This patient [name] have reduced enzyme activity as compared to individuals with the normal phenotype.  UGT1A4Ultrarapid Metabolizer *1/*3 UGT1A4*1 allele enzyme activity: Normal UGT1A4*3 allele enzyme activity: Increased  This genotype is most consistent with the ultrarapid metabolizer phenotype. This patient [name] have increased enzyme activity as compared to individuals with the normal phenotype.  UGT2B15Intermediate Metabolizer *2/*2 UGT2B15*2 allele enzyme activity:  Reduced UGT2B15*2 allele enzyme activity: Reduced  This genotype is most consistent with the intermediate metabolizer phenotype. This patient [name] have reduced enzyme activity as compared to individuals with the normal phenotype. .res Assessment: Plan:    Briyonna was seen today for follow-up, depression, anxiety and stress.  Diagnoses and all orders for this visit:  Major depressive disorder, recurrent episode, moderate (HCC) -     lithium carbonate (ESKALITH) 450 MG ER tablet; Take 1 tablet (450 mg total) by mouth at bedtime. -     imipramine (TOFRANIL) 50 MG tablet; Take 1 tablet (50 mg total) by mouth at bedtime.  Generalized anxiety disorder -     lithium carbonate (ESKALITH) 450 MG ER tablet; Take 1 tablet (450 mg total) by mouth at bedtime.  Attention deficit hyperactivity disorder (ADHD), combined type -     amphetamine-dextroamphetamine (ADDERALL) 7.5 MG tablet; Take 1 tablet by mouth daily. -     amphetamine-dextroamphetamine (ADDERALL XR) 15 MG 24 hr capsule; Take 1 capsule by mouth every morning. -     amphetamine-dextroamphetamine (ADDERALL XR) 15 MG 24 hr capsule; Take 1 capsule by mouth every morning.  Tremor due to multiple drugs     30 min face to face time with patient was spent on counseling and coordination of care. We discussed  Genesight testing and her desire to take something for anxiety that is in the "Use as Directed" column.   Overall depression is better and anxiety was better too but not gone and worse without lithium.   Overall more irritable than depressed but just with H.  Some chronic anxiety She is getting some benefit of Adderall but feels like it wears off about 2 to 3 PM she would want longer duration. Rec she not change meds on her own.  This has been an issue  Chose TCA bc Genesight test;  disc SE in detail incl cardiac, etc  on imipramine 100 mg HS for a week and level was WNL at 238. She is doing ok with imipramine 50 mg HS and doesn't want  change  Stopped B 6 apparently. Continue lithium 450 mg QD.  It is working.  B12 level checked 496 and normal.  Change Adderall XR to 15 mg daily to provide more consistency.  Helps  productive and helps anxiety and function and depression. It is okay to use as needed Adderall 7.5 mg either before the XR dose or after the XR dose to extend duration.  Not currently using , Ativan 1/2 mg tid prn since she is tired from it and not on Adderall usually.    Consider alternatives for anger outbursts, impulsivity like lithium, VPA, atypical.  Constipation management  extensively discussed again and types of fiber.  1.  Lots of water 2.  Powdered fiber supplement such as MiraLAX, Citrucel, etc. preferably with a meal 3.  2 stool softeners a day 4.  Milk of magnesia or magnesium tablets if needed  Supportive therapy around marital crisis and other stressors.  She needs some goals.  Needs to get out of the house chronically.  Started therapy with Danice Goltz.  FU 8-12 weeks  Meredith Staggers, MD, DFAPA   Please see After Visit Summary for patient specific instructions.  Future Appointments  Date Time Provider Department Center  03/19/2023  9:30 AM Plotnikov, Georgina Quint, MD [facility] None     No orders of the defined types were placed in this encounter.     -------------------------------

## 2023-01-02 DIAGNOSIS — M832 Adult osteomalacia due to malabsorption: Secondary | ICD-10-CM | POA: Diagnosis not present

## 2023-01-02 DIAGNOSIS — E039 Hypothyroidism, unspecified: Secondary | ICD-10-CM | POA: Diagnosis not present

## 2023-01-02 DIAGNOSIS — M8580 Other specified disorders of bone density and structure, unspecified site: Secondary | ICD-10-CM | POA: Diagnosis not present

## 2023-01-02 DIAGNOSIS — R7989 Other specified abnormal findings of blood chemistry: Secondary | ICD-10-CM | POA: Diagnosis not present

## 2023-01-03 ENCOUNTER — Other Ambulatory Visit: Payer: Self-pay | Admitting: Psychiatry

## 2023-01-03 DIAGNOSIS — F411 Generalized anxiety disorder: Secondary | ICD-10-CM

## 2023-01-03 DIAGNOSIS — F331 Major depressive disorder, recurrent, moderate: Secondary | ICD-10-CM

## 2023-01-05 DIAGNOSIS — F411 Generalized anxiety disorder: Secondary | ICD-10-CM | POA: Diagnosis not present

## 2023-01-14 DIAGNOSIS — M48062 Spinal stenosis, lumbar region with neurogenic claudication: Secondary | ICD-10-CM | POA: Diagnosis not present

## 2023-01-14 DIAGNOSIS — F411 Generalized anxiety disorder: Secondary | ICD-10-CM | POA: Diagnosis not present

## 2023-01-19 DIAGNOSIS — F411 Generalized anxiety disorder: Secondary | ICD-10-CM | POA: Diagnosis not present

## 2023-01-28 DIAGNOSIS — F411 Generalized anxiety disorder: Secondary | ICD-10-CM | POA: Diagnosis not present

## 2023-02-11 DIAGNOSIS — F411 Generalized anxiety disorder: Secondary | ICD-10-CM | POA: Diagnosis not present

## 2023-02-25 DIAGNOSIS — F411 Generalized anxiety disorder: Secondary | ICD-10-CM | POA: Diagnosis not present

## 2023-03-05 ENCOUNTER — Encounter: Payer: Self-pay | Admitting: Psychiatry

## 2023-03-05 ENCOUNTER — Ambulatory Visit (INDEPENDENT_AMBULATORY_CARE_PROVIDER_SITE_OTHER): Payer: Medicare Other | Admitting: Psychiatry

## 2023-03-05 DIAGNOSIS — F411 Generalized anxiety disorder: Secondary | ICD-10-CM | POA: Diagnosis not present

## 2023-03-05 DIAGNOSIS — Z79899 Other long term (current) drug therapy: Secondary | ICD-10-CM

## 2023-03-05 DIAGNOSIS — F331 Major depressive disorder, recurrent, moderate: Secondary | ICD-10-CM

## 2023-03-05 DIAGNOSIS — F902 Attention-deficit hyperactivity disorder, combined type: Secondary | ICD-10-CM | POA: Diagnosis not present

## 2023-03-05 DIAGNOSIS — G251 Drug-induced tremor: Secondary | ICD-10-CM | POA: Diagnosis not present

## 2023-03-05 DIAGNOSIS — D508 Other iron deficiency anemias: Secondary | ICD-10-CM | POA: Diagnosis not present

## 2023-03-05 MED ORDER — LITHIUM CARBONATE 150 MG PO CAPS
150.0000 mg | ORAL_CAPSULE | Freq: Every evening | ORAL | 0 refills | Status: DC
Start: 2023-03-05 — End: 2023-05-07

## 2023-03-05 NOTE — Progress Notes (Signed)
Courtney [name] [id number] [name] 04, 1952 72 y.o.   Subjective:   Patient ID:  Courtney [name] is a 72 y.o. (DOB [birth year]) female.  Chief Complaint:  Chief Complaint  Patient presents with   Follow-up   Depression   Anxiety   Stress     Lasharn [name] presents to the office today for follow-up of first visit 03/01/2020 referred by a friend Courtney [name].  Was prescribed Viibryd and Vyvanse was changed to Concerta 54 mg to try to reduce the amount of dry mouth.  05/02/2020 appointment with the following noted: Less dry mouth with Concerta but didn't seem to last beyond 3-4 PM. Viibryd 20 mg daily.  Hard to get enough calories at breakfast.  But has been taking both in the morning.  Some benefit with Viibryd. Is there something I can take besides a stimulant, bc fears it is stressing her body.  Dx college exhausted adrenal glands.  It helps.  Asked questions about alternatives to stimulants.  Reduced Concerta to reduce anxiety and see if can achieve, but she thinks it's worse in the evening. Anxiety is still a struggle.  Life crisis moment.  Chronic marital dissatisfaction worse now that both are retired.  Feels like she's in a prison.  Family notices she's stressed.  Has done therapy since age 22 yo.  Has done Hindu meditation without help.  Christian faith disciplines.  Grew up caretaking.  I'm done and I need to change.  Goal is calm down enough to function through the situation. Tingling foot and wants B12 testing. Plan: Switch Viibryd to 20 mg in evening meal to get better absorption.  06/18/2020 appointment with following noted: No difference in anxiety or mood with Viibryd 20 mg daily.  She doesn't want to increase it. Questions about Viibryd and Genesight testing.  Asks about differences between Adderall and MPH.   Missed for 3 days Concerta and took Adderall.    Tired by 4-5 PM and doesn't like that.    Plan: She wants to wean off the Viibryd because she does not feel it  has been helpful and she does not want to increase the dosage.  07/03/2020 phone call patient stating she wanted to continue Viibryd 20 mg daily.  She also requested refill of Concerta 54 mg  08/14/2020 appointment with the following noted: Was told she couldn't take Viibryd with one of the pain meds and had TKR.  So stopped it.  Couldn't tolerate pain meds except tramadol.  Had more pain than expected.   Stopped Viibryd about 07/15/20.  She had taken tramadol with Viibryd in the past without a problem.   Noticing more pain after surgery in non-surgical places. Seems to be sensitive to getting lightheaded and confused with low blood sugar.   Poor attention and scattered since surgery.  Also more tired. Occ taken tramadol and otherwise just Tylenol. Impossible to tell the effect of the Viibryd given she hasn't been herself for months. Concerned about H's Courtney [name]'s memory who is also a patient here.  Wants him to get neuropsych testing. Plan because patient is med sensitive we will start very low-dose fluvoxamine 25 mg nightly Per her request continue Concerta 54 mg every morning  10/25/2020 appointment with the following noted: Several phone calls since being here.  The first led to increasing fluvoxamine to 1-1/2 of the 25 mg tablets due to lack of effect at 3 weeks. 09/21/2020 she called asking to stop Concerta which she did. She called again wanting to start Adderall.  Because she had taken it in 2019 it was agreed that she could start Adderall 10 mg twice daily. Stress dealing with husband and whether to move or not.  [name] separate.  Anxiety is very high.  Hard to calm down around him. $ stress.  Explodes with anger at husband.  Doesn't think he can sell the house on his own. Never been this stressed and anxious and in this kind of circumstance before. Never got the Adderall DT need for PA. Only caffeine in the AM Plan: Rec increase Luvox from 37.5 mg daily to 50 mg HS for a week then increase to  75 mg daily and possibly higher.  B12 level checked 496 and normal. Hold Adderall until the anxiety is under control.   12/03/2020 phone call from patient: Patient called stating fluvoxamine made her more anxious and high strung and she stopped it.  She wanted an earlier appointment which was not available at the time she was given the option to see a nurse practitioner but refused.  12/19/2020 appointment with the following noted: Life very stressful right now and not getting better.  B in law died and marriage falling apart. Separating.   More than I can handle including anxiety and depression. Plan: Rec trial beta blocker propranolol 10-30 mg  twice daily for anxiety Consider TCA bc Genesight test  B12 level checked 496 and normal. Hold Adderall until the anxiety is under control.   01/01/2021 phone call from patient with nurse as follows: She is taking up to 30 mg propranolol twice daily without sufficient benefit for her anxiety.  01/03/2021 appointment with the following noted: Rare Adderall.  Propranolol didn't help anxiety much at 30 mg BID without SE.[name]   Thinks she's gotten depressed which is unusual.  In a perfect storm with divorce and moving and financial stress.  Not functioning well.  Hard to make a decision.  Poor productivity.   Having a lot of pain, chronically and worse lately. Starting Lyrica today. Taking alprazolam about 0.5 mg daily. Plan: Yes imipramine 25 and increase to 75 mg HS and then check blood level  02/26/21 appt noted: 01/21/2021 serum imipramine and desipramine total was 65 at 75 mg daily. It has made a difference.  But has made so many changes and moving.  Stress goes to GI system.   Was in ER last night with GI px and [name] have ulcer.  Unable to eat without nausea.  Lump in the throat nausea. Mild taking the edge off.  Can still explode. Can't remember the SE at 100 mg daily. GI dominating with pain and nausea. Sleep varies from good or bothered by sickness.  Usually fine but sometimes can't turn her brain off.  Moves in 5 days. Plan: Continue imipramine and increase to 100 mg again as soon as nausea managed. Add risperidone 1 mg HS off label for nausea and anxiety.  03/28/2021 appt noted: She is not taking the risperidone.  Tried it for a couple of weeks but did not help the nausea.  Didn't notice it helping anxiety but had tremor.  Off for 2 weeks and tremor better. Still on imipramine 75 mg nightly.  She did increase but wasn't sure it was causing the tremor as instructed. This week to GI and work up ordered and doubled med dose. Not handling the transition well.  Very irritable with husband.  Such a sense of urgency.   Not often with Xanax.   Not on Adderall.   Plan: imipramine  and increase to 100 mg nightly.  B12 level checked 496 and normal. Hold Adderall until the anxiety is under control.  Switch Xanax to Ativan 1 mg tid since she is not on Adderall.    05/02/2021 appt noted: Increased imipramine 100 mg HS. Tolerated the increase Spleen bleed. Yesterday decent energy compared to what she had.  Ativan made her sleepy but only took it once. Sleeping a lot DT spleen injury. Can't tell about depression bc of injury kept her in bed.  Has to move to feel normal. Plan: imipramine and increase to 150 mg nightly, based on prior level.  05/29/21 appt noted: SE dry mouth Increased imipramine 150 mg HS Seen improvement in mood.  Meeting irritating situations with less response.  Better self control.  Less fear and anxiety. Ativan 1 mg prn makes her too sleepy and tired. Occ taken 1/2 Adderall with some energy and motivation.   GI work up ongoing with gastric emptying yesterday.  It's delayed ongoing.  GI doctor has commented on her hostility.   Sleeping really well.   Worst dep and anxiety was when moving and now 50% better.  Need to have some goals and since moving into townhouse and unknown what is next.  [name] move out of town to be near the kids  at part of the year but it's too cold.  Big decisions are hard. Difficulty with back pain so hasn't unpacked.   More hopeful about her health. Seasonal depression Plan: Imipramine is helpful but increase could be more helpful based on level.  Disc pros and cons on increase.  She wants to increase it..  Consider lithium augmentation. Increase imipramine to 200 mg nightly, based on prior level Disc light therapy in detail and gave handout.    Adderall prn. Reduce Ativan 1/2 mg tid prn since she is tired from it and not on Adderall usually.    07/05/2021 phone call complaining of dry mouth with imipramine.  Frustrating because of reportedly low sodium and was told to drink less water. MD response:We increased the imipramine from 3 of the 50 mg tablets to 4 of the 50 mg tablets on February 1.  If she has seen additional improvement in her mood it is to her advantage to continue the current dosage.  If she has not seen additional improvement since increasing the dose she can drop the imipramine back to 3 tablets daily. In addition to that she should discuss the dry mouth problem with her pharmacist as the pharmacist [name] have some other recommendations to help manage it.  But the usual recommendation is to switch her toothpaste to Biotene toothpaste and use the Biotene mouthwash and Biotene spray to manage the dry mouth as best she can. Patient response:Notified patient. She said the issue is more complex than just dry mouth. She has been having GI issues and has been very sick, had a splenic bleed, is unable to get out of bed at times. Dr[facility] Shawn Stall at [facility] is her GI doctor. Patient spoke to her after Gloris Manchester talked to her today and is wanting her to stop the imipramine to eliminate that as a source of her issues.   MD response:Okay she can come off the imipramine better mood anxiety and irritability will get worse off of it because it helped her.  However we cannot start any new medicines until we see if  her physical symptoms are any different off the medicine.  She can reduce imipramine by 1 tablet every 3  to 4 days.  The dry mouth will not resolve until she is off the medicine.  The splenic bleed is not related to imipramine unless she has some bizarre sort of allergy.  08/01/21 appt noted: Micah Flesher totally off imipramine for a week and GI px not better and restarted imipramine to 100 mg HS DT chronic constipation and then this week increased to 150 just this week.   Therapy with Vikki Ports helping a lot. Overall depression and anxiety is better. Trying to find endo.  Medically cleared from splenic bleed but doesn't think it is fine. Episode of abd paine with result in bed 5 days but back out of bed and functioning again. Hasn't needed lorazepam lately. Recognizes still angry and intense too much .   Plan check level and add lithium 300  10/07/21 appt noted; Taboo about lithium and didn't take it. Irritability is still focused and directed at H and not sure how it would be wihtout him.  Trying to address this with therapy and faith and prayer.   Seeing Danice Goltz.   Everything fuels my fire.  Erupting this is never gonna change rage.  Recognizes this part is her problem.  Gets upset with him not doing things she wants done. Body getting stronger since Xmas and more she can do the better her depression.   Still some stiffness with pain in spine and leg in AM and then gets better. Not seriously depressed if can move. Don't handle inactivity well.  Able to do more now though has helped. Can't control her reaction to Trey Paula though knows it is over the top since she retired. Intermittent nausea with activity causes her to go to bed.  Waves of pain with it.  Frustrated cause is unknown. Plan: Continue imipramine 150 mg HS for a week and level was WNL at 238. Augment with lithium CR 300 mg HS for anger and mood  01/22/22 appt noted: Currently take imipramine 100 mg HS and lithium CR 300 mg HS, Adderall  10 BID Reduced imipramine to help tremor and it is some better.  Occ tremor, positional.  Mood is a lot better with lithium.  Right away clearing of haze and calming down.  Better outlook and been like that since then. Questions about effects on organs.   Sleep well.   Not sig anxious now.  Occ moments of anxiety.   Asks if could stop imipramine bc benefit with lithium.  04/10/22 appt noted: Currently stopped imipramine 100 mg HS (2 days ago) and taking lithium CR 300 mg HS, Adderall 10 BID Good but have to stop the imipramine bc of the shaking.  She cut dose to 50 mg QOD. Tremor worse with R hand.  R handed.  Feels like lithium helped She wants to see what mood is like without imipramine.  Less tremor with less imipramine. Tremor is better with less right now. [name] need shoulder replacement DT pain.  Also needs spinal fusion DT dropped foot. Hard to talk about it bc so dreads it.   Irritability is there and when ignited is bad but working on it in therapy.    Been angry my whole life.  Back to trauma in childhood.  Overall though better with lithium.   Miralax helped GI problems but waves of ok and then GI px.   Needs Adderall daily. Takes Ativan just occ with stressful circumstances. Plan: on imipramine 100 mg HS for a week and level was WNL at 238. She stopped it this  week and disc risk of relapse of dep but she hopes lithium will keep dep at bay Continue lithium CR 300 mg HS bc helped anger and mood  06/17/22 appt noted: recently taking 1/2 lithium 300 and imipramine 50 mg HS for 4-5 days and tremor better with less lithium. CO tremor;   irritates her and makes her feel bad about herself.  Never really tried more 10 mg propranolol for tremor. More pain in shoulder.   Not sure if any change in explosive irritation at H.  Not sure if it is her H or bc of the relationship. Depression is not that bad in the last month and no worse with less imipramine.   Would like to put a spacer between  triggers and what she says to H .  Not good.   When in [place] 2 yr ago for 2 mos alone but still had underlying anger problems. Plan: no med changes  07/14/22 appt noted: Increased lithium back to 1 and 1/2 tablet for a month.   Till has it in R hand esp.  Uses propranolol sometimes. Only tried it 4 times. An on impramine 50 mg HS imipramine 100 mg HS for a week and level was WNL at 238. Mood not bad at all  Irritability ?  Better with more lithium.  09/10/22 appt noted: Chronic procrastination unless workig job. Mood is not bad except triggered by H.  No change with increase lithium.  B6 400 BID has reduced and managed tremor. Almost entirely stopped Adderall bc GI sx if not taken with food.  Gut never normal since spleen issue.  Feels something wrong with GI tract.  Cycles of constipation and N and then abd cramping for days at time and then it will be ok for awhile.   Psych meds imipramine 50 mg HS, lithium 300/450 QOD, Adderall 10 BID prn, lorazepam 1 mg prn (none lately, I don't like to be slowed down). Taking miralax every other day.   Anxiety is not unusuallly high  no panic but some chronic anxiety. Sleep well at night and needs nap if not 8 hours. More postive when on Adderall re: mood and better focused and task completion.    01/01/23 appt noted: Psych meds: Adderall XR 15, cyclobenzaprine 10 mg as needed in the AM for tremor, imipramine 50 mg nightly, lithium ER 450 mg nightly. (Never increased), lorazepam rarely She reduced imipramine to 50 bc she is convinced it is causing the shaking.  Not very much tremor but some postural.   Mood is ok.  I'm happy.   Has Covid.  Day 4-5 of sx, pretty mild.    03/05/23 appt noted: Psych med: as above, imipramine 50 mg HS, lithium ER 450 mg HS, Adderall XR 15 AM Wonders about ongoing nonstop battle with constipation since colonoscopy.  Also SE dry mouth 3-4 hours.  Level of dep is much better than it was.   Working in therapy with Danice Goltz. H Courtney [name] remains target of eruptive anger "If there was a way to calm me down" Chronic constipation from childhood is worse.    Past Psychiatric Medication Trials: Trintellix NR, sertraline?,  Viibryd 20, remote zoloft, Paxil, Cymbalta couple days ? Adverse reaction Imipramine 150 better imipramine 100 mg HS for a week and level was WNL at 238. Fluvoxamine SE anxiety but ? Adequacy of trial bc stress Fluvoxamine 100 in 2015 per Dr. Nolen Mu Lithium 300 helpful  Gabapentin 1600 NR Lyrica  Risperidone 1 tremor  Vyvanse, Adderall, concerta  DC propranolol 10-30 mg  twice daily for anxiety bc not helpful, lorazepam History Genesight History of Dr. Nolen Mu and Deatra Robinson  Started therapy at 55 yo bc fear mother would die any minute bc she had cardiac px.  Review of Systems:  Review of Systems  Cardiovascular:  Negative for chest pain.  Gastrointestinal:  Positive for abdominal pain and nausea.  Musculoskeletal:  Positive for arthralgias and back pain.  Neurological:  Positive for dizziness and tremors. Negative for weakness.  Psychiatric/Behavioral:  Positive for dysphoric mood. Negative for agitation, confusion, decreased concentration and hallucinations. The patient is nervous/anxious.     Medications: I have reviewed the patient's current medications.  Current Outpatient Medications  Medication Sig Dispense Refill   albuterol (VENTOLIN HFA) 108 (90 Base) MCG/ACT inhaler Inhale 2 puffs into the lungs every 4 (four) hours as needed for wheezing or shortness of breath. 18 g 3   amphetamine-dextroamphetamine (ADDERALL XR) 15 MG 24 hr capsule Take 1 capsule by mouth every morning. 30 capsule 0   amphetamine-dextroamphetamine (ADDERALL XR) 15 MG 24 hr capsule Take 1 capsule by mouth every morning. 30 capsule 0   amphetamine-dextroamphetamine (ADDERALL) 7.5 MG tablet Take 1 tablet by mouth daily. 30 tablet 0   cyclobenzaprine (FLEXERIL) 10 MG tablet Take 10 mg by mouth as  needed.     fluticasone (FLONASE) 50 MCG/ACT nasal spray Place into both nostrils daily.     hydroxychloroquine (PLAQUENIL) 200 MG tablet Take 1 tablet by mouth 2 (two) times daily.     imipramine (TOFRANIL) 50 MG tablet Take 1 tablet (50 mg total) by mouth at bedtime. 90 tablet 1   levothyroxine (SYNTHROID) 50 MCG tablet Take 50 mcg by mouth daily before breakfast.     lithium carbonate (ESKALITH) 450 MG ER tablet Take 1 tablet (450 mg total) by mouth at bedtime. 90 tablet 0   LORazepam (ATIVAN) 1 MG tablet Take 1 tablet (1 mg total) by mouth every 8 (eight) hours. (Patient taking differently: Take 1 mg by mouth as needed.) 90 tablet 1   nirmatrelvir/ritonavir (PAXLOVID) 20 x 150 MG & 10 x 100MG  TABS Patient GFR is 73 Take nirmatrelvir (150 mg) 2 tablet(s) twice daily for 5 days and ritonavir (100 mg) one tablet twice daily for 5 days.take  Medication together. 30 tablet 0   omega-3 acid ethyl esters (LOVAZA) 1 g capsule Take 2 capsules (2 g total) by mouth 2 (two) times daily. 120 capsule 11   ondansetron (ZOFRAN-ODT) 4 MG disintegrating tablet Take 1 tablet (4 mg total) by mouth every 8 (eight) hours as needed. 20 tablet 0   pravastatin (PRAVACHOL) 20 MG tablet Take one tablet by mouth 4 days per week (Patient taking differently: Take 20 mg by mouth daily. Take one tablet by mouth 1-2 days per week) 48 tablet 3   Testosterone 1.62 % GEL Place 5 mg onto the skin daily. 30 g 3   traMADol (ULTRAM) 50 MG tablet TAKE 1/2 TO 1 TABLET(25 TO 50 MG) BY MOUTH BACK EVERY 8 HOURS AS NEEDED FOR SEVERE PAIN 90 tablet 0   triamcinolone cream (KENALOG) 0.5 % Apply 1 Application topically 3 (three) times daily. 30 g 1   vitamin E 400 UNIT capsule Take 800 Units by mouth daily.      nitroGLYCERIN (NITROSTAT) 0.4 MG SL tablet Place 1 tablet (0.4 mg total) under the tongue every 5 (five) minutes as needed for chest pain. (Patient not taking: Reported on 08/13/2021) 25  tablet 2   No current facility-administered  medications for this visit.    Medication Side Effects: Other: dry mouth  Allergies:  Allergies  Allergen Reactions   Clarithromycin Nausea Only   Covid-19 (Mrna) Vaccine Other (See Comments)    Acute Vasculitis; excessive bleeding  Other reaction(s): Other (See Comments) Acute Vasculitis; excessive bleeding    Other Other (See Comments)   Bactrim [Sulfamethoxazole-Trimethoprim] Nausea And Vomiting   Morphine Nausea And Vomiting    Other reaction(s): Nausea/Vomiting   Morphine And Codeine Nausea And Vomiting   Azithromycin Nausea And Vomiting and Nausea Only   Ceclor [Cefaclor] Nausea And Vomiting    Can take Augmentin ok   Claritin [Loratadine]     REACTION: bruises   Codeine Nausea And Vomiting    Can take Tramadol ok   Cymbalta [Duloxetine Hcl]    Erythromycin Nausea And Vomiting    Other reaction(s): Nausea/Vomiting   Erythromycin Base Nausea And Vomiting   Levofloxacin Nausea Only    REACTION: nausea   Lipitor [Atorvastatin]     Other reaction(s): Myalgias (intolerance)   Nitrofurantoin Nausea Only   Oxycodone Other (See Comments)   Penicillins Nausea And Vomiting    Can take Augmentin   Propoxyphene Nausea Only    dizzy   Propoxyphene N-Acetaminophen Nausea Only    dizzy    Past Medical History:  Diagnosis Date   ADD (attention deficit disorder)    Anxiety    Celiac disease    possible vs IBS   Complication of anesthesia    Coronary artery disease    Depression    no bipolar per Dr. Nolen Mu   GI problem    Juanda Chance   Gluten intolerance    Hyperlipidemia    IBS (irritable bowel syndrome)    Osteoarthritis    Osteoporosis    Pneumonia    PONV (postoperative nausea and vomiting)    Rheumatoid arthritis (HCC)    Seasonal allergies 01/08/2012   hx. of multiple bronchitis related to this.   Shoulder pain, right     Family History  Problem Relation Age of Onset   Heart disease Mother    Heart attack Mother    Prostate cancer Father    Heart  disease Sister    Heart disease Maternal Grandmother    Breast cancer Paternal Grandmother    Colon cancer Paternal Grandfather    Coronary artery disease Other        FH Female 1st degree relative <60   ADD / ADHD Other    Other Daughter        gluten intolerance    Social History   Socioeconomic History   Marital status: Married    Spouse name: Not on file   Number of children: 2   Years of education: Not on file   Highest education level: Not on file  Occupational History   Occupation: Network engineer  Tobacco Use   Smoking status: Never   Smokeless tobacco: Never   Tobacco comments:    only Financial trader   Vaping status: Never Used  Substance and Sexual Activity   Alcohol use: Yes    Comment: 0-1 per day   Drug use: No   Sexual activity: Yes  Other Topics Concern   Not on file  Social History Narrative   Married for last 42 years.Lives with husband.Retired Human resources officer.Originally from [place].   Social Determinants of Health   Financial Resource Strain: Low Risk  (02/27/2022)  Overall Financial Resource Strain (CARDIA)    Difficulty of Paying Living Expenses: Not hard at all  Food Insecurity: No Food Insecurity (02/27/2022)   Hunger Vital Sign    Worried About Running Out of Food in the Last Year: Never true    Ran Out of Food in the Last Year: Never true  Transportation Needs: No Transportation Needs (02/27/2022)   PRAPARE - Administrator, Civil Service (Medical): No    Lack of Transportation (Non-Medical): No  Physical Activity: Sufficiently Active (02/27/2022)   Exercise Vital Sign    Days of Exercise per Week: 5 days    Minutes of Exercise per Session: 30 min  Stress: No Stress Concern Present (02/27/2022)   [employer] of Occupational Health - Occupational Stress Questionnaire    Feeling of Stress : Not at all  Social Connections: Socially Integrated (02/27/2022)   Social Connection and Isolation Panel [NHANES]     Frequency of Communication with Friends and Family: More than three times a week    Frequency of Social Gatherings with Friends and Family: More than three times a week    Attends Religious Services: 1 to 4 times per year    Active Member of [employer] or Organizations: Yes    Attends Banker Meetings: 1 to 4 times per year    Marital Status: Married  Catering manager Violence: Not At Risk (02/27/2022)   Humiliation, Afraid, Rape, and Kick questionnaire    Fear of Current or Ex-Partner: No    Emotionally Abused: No    Physically Abused: No    Sexually Abused: No    Past Medical History, Surgical history, Social history, and Family history were reviewed and updated as appropriate.   Please see review of systems for further details on the patient's review from today.   Objective:   Physical Exam:  LMP 01/08/1999   Physical Exam Neurological:     Mental Status: She is alert and oriented to person, place, and time.     Cranial Nerves: No dysarthria.  Psychiatric:        Attention and Perception: Attention and perception normal.        Mood and Affect: Mood is anxious. Mood is not depressed. Affect is not tearful.        Speech: Speech normal. Speech is not rapid and pressured.        Behavior: Behavior is cooperative.        Thought Content: Thought content normal. Thought content is not paranoid or delusional. Thought content does not include homicidal or suicidal ideation. Thought content does not include suicidal plan.        Cognition and Memory: Cognition and memory normal.        Judgment: Judgment normal.     Comments: Insight intact Anger at home.  Not in office     Lab Review:     Component Value Date/Time   NA 135 08/27/2022 0940   NA 134 06/14/2018 1444   K 4.4 08/27/2022 0940   CL 101 08/27/2022 0940   CO2 29 08/27/2022 0940   GLUCOSE 88 08/27/2022 0940   BUN 21 08/27/2022 0940   BUN 10 06/14/2018 1444   CREATININE 0.95 08/27/2022 0940   CREATININE  0.76 04/10/2020 1431   CALCIUM 10.0 08/27/2022 0940   PROT 6.6 08/27/2022 0940   PROT 6.5 12/19/2020 0754   ALBUMIN 4.0 08/27/2022 0940   ALBUMIN 4.5 12/19/2020 0754   AST 23 08/27/2022 0940  ALT 17 08/27/2022 0940   ALKPHOS 53 08/27/2022 0940   BILITOT 0.3 08/27/2022 0940   BILITOT 0.4 12/19/2020 0754   GFRNONAA 60 (L) 04/17/2021 1825   GFRNONAA 79 04/10/2020 1431   GFRAA 91 04/10/2020 1431       Component Value Date/Time   WBC 6.2 08/27/2022 0940   RBC 4.36 08/27/2022 0940   HGB 13.8 08/27/2022 0940   HCT 41.6 08/27/2022 0940   PLT 350.0 08/27/2022 0940   MCV 95.5 08/27/2022 0940   MCH 31.4 04/17/2021 1825   MCHC 33.0 08/27/2022 0940   RDW 13.7 08/27/2022 0940   LYMPHSABS 1.4 08/27/2022 0940   MONOABS 0.5 08/27/2022 0940   EOSABS 1.0 (H) 08/27/2022 0940   BASOSABS 0.1 08/27/2022 0940    No results found for: "POCLITH", "LITHIUM"   No results found for: "PHENYTOIN", "PHENOBARB", "VALPROATE", "CBMZ"   06/04/20 B12 normal at 496  01/21/2021 imipramine level 25, desipramine level 40 equals total 65 which is low on 75 mg daily. 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 They have one copy of the C allele and one copy of the G allele. This genotype suggests a normal response to certain ADHD medications.  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 This genotype suggests a lower risk of serious hypersensitivity reactions, including Stevens-Johnson syndrome (SJS), toxic epidermal necrolysis (TEN), maculopapular eruptions, and Drug Reaction with Eosinophilia and Systemic Symptoms when taking certain mood stabilizers.  HLA-B*1502Lower Risk Not Present This patient does not carry the HLA-B*1502  allele or a closely related *15 allele. Absence of HLA-B*1502 and the closely related *15 alleles suggests lower risk of serious dermatologic reactions including toxic epidermal necrolysis (TEN) and Stevens-Johnson syndrome (SJS) when taking certain mood stabilizers.  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 They carry two copies of the G allele. This genotype has been associated with an increased risk of adverse drug reactions with certain selective serotonin reuptake inhibitors.  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 The short promoter allele is reported to decrease expression of the serotonin transporter compared to the homozygous long promoter allele. The patient [name] have a moderately decreased likelihood of response to certain selective serotonin reuptake inhibitors due to the presence of the short form of the gene.  Pharmacokinetic Genes PK CES1A1Extensive (Normal) Metabolizer GLY/GLY CES1A1 - Gly allele enzyme activity: Normal CES1A1 - Gly allele enzyme activity: Normal  This genotype is most consistent with the extensive (normal) metabolizer phenotype.  The patient is expected to have normal enzyme activity.  CYP1A2Extensive (Normal) Metabolizer *1/*1 This genotype is most consistent with the extensive (normal) metabolizer phenotype.  CYP2B6Poor Metabolizer *6/*6 CYP2B6*6 allele enzyme activity: Reduced CYP2B6*6 allele enzyme activity: Reduced  This genotype is most consistent with the poor metabolizer phenotype. This patient [name] have reduced enzyme activity as compared to individuals with the normal phenotype.  CYP2C19Extensive (Normal) Metabolizer *1/*1 CYP2C19*1 allele enzyme activity: Normal CYP2C19*1 allele enzyme activity: Normal  This genotype is most consistent  with the extensive (normal) metabolizer phenotype.  CYP2C9Intermediate Metabolizer *1/*2 CYP2C9*1 allele enzyme activity: Normal CYP2C9*2 allele enzyme activity: Reduced  This genotype is most consistent with the intermediate metabolizer  phenotype. This patient [name] have reduced enzyme activity as compared to individuals with the normal phenotype.  CYP2D6Extensive (Normal) Metabolizer *1/*41 CYP2D6*1 allele enzyme activity: Normal CYP2D6*41 allele enzyme activity: Reduced  This genotype is most consistent with the extensive (normal) metabolizer phenotype.  CYP3A4Poor Metabolizer *22/*22 CYP3A4*22 allele enzyme activity: Reduced CYP3A4*22 allele enzyme activity: Reduced  This genotype is most consistent with the poor metabolizer phenotype. This patient [name] have reduced enzyme activity as compared to individuals with the normal phenotype.  UGT1A4Ultrarapid Metabolizer *1/*3 UGT1A4*1 allele enzyme activity: Normal UGT1A4*3 allele enzyme activity: Increased  This genotype is most consistent with the ultrarapid metabolizer phenotype. This patient [name] have increased enzyme activity as compared to individuals with the normal phenotype.  UGT2B15Intermediate Metabolizer *2/*2 UGT2B15*2 allele enzyme activity: Reduced UGT2B15*2 allele enzyme activity: Reduced  This genotype is most consistent with the intermediate metabolizer phenotype. This patient [name] have reduced enzyme activity as compared to individuals with the normal phenotype. .res Assessment: Plan:    Carmella was seen today for follow-up, depression, anxiety and stress.  Diagnoses and all orders for this visit:  Major depressive disorder, recurrent episode, moderate (HCC)  Generalized anxiety disorder  Attention deficit hyperactivity disorder (ADHD), combined type  Tremor due to multiple drugs  Other iron deficiency anemia    30 min face to face time with patient was spent on counseling and coordination of care.  We discussed  Genesight testing and her desire to take something for anxiety that is in the "Use as Directed" column.   Overall depression is better and anxiety was better too but not gone and worse without lithium.   Overall more irritable than depressed but just with H.  Some chronic anxiety She is getting some benefit of Adderall but feels like it wears off about 2 to 3 PM she would want longer duration. Rec she not change meds on her own.  This has been an issue  Chose TCA bc Genesight test;  disc SE in detail incl cardiac, etc  on imipramine 100 mg HS for a week and level was WNL at 238. She is doing ok with imipramine 50 mg HS and doesn't want change but is concerned about anger  Stopped B 6 apparently. increase lithium 600 mg every day trial for ongoing anger.   If it helps then can try reducing imipramine Check lithium level and BMP   B12 level checked 496 and normal.  Change Adderall XR to 15 mg daily to provide more consistency.  Helps  productive and helps anxiety and function and depression. It is okay to use as needed Adderall 7.5 mg either before the XR dose or after the XR dose to extend duration.  Not currently using , Ativan 1/2 mg tid prn since she is tired from it and not on Adderall usually.    Consider alternatives for anger outbursts, impulsivity like lithium, VPA, atypical.  Constipation management  extensively discussed again and types of fiber.  1.  Lots of water 2.  Powdered fiber supplement such as MiraLAX, Citrucel, etc. preferably with a meal 3.  2 stool softeners a day 4.  Milk of magnesia or magnesium tablets if needed She has seen multiple GI docs for this over the years.  Supportive therapy around marital crisis and other stressors.  She needs some goals.  Needs to get out of the house chronically.  Started therapy with Danice Goltz.  FU 8-12 weeks  Meredith Staggers, MD, DFAPA   Please see After Visit Summary for patient  specific  instructions.  Future Appointments  Date Time Provider Department Center  03/19/2023  9:30 AM Plotnikov, Georgina Quint, MD [facility] None     No orders of the defined types were placed in this encounter.     -------------------------------

## 2023-03-11 DIAGNOSIS — F411 Generalized anxiety disorder: Secondary | ICD-10-CM | POA: Diagnosis not present

## 2023-03-15 ENCOUNTER — Telehealth: Payer: Self-pay

## 2023-03-15 NOTE — Telephone Encounter (Signed)
Prior Authorization initiated Amphetamine-Dextroamphet ER 15MG  er capsules #30/30 [employer]  Approved Effective:   12/15/2022 - 03/14/2024

## 2023-03-17 ENCOUNTER — Other Ambulatory Visit: Payer: Self-pay

## 2023-03-17 ENCOUNTER — Telehealth: Payer: Self-pay | Admitting: Psychiatry

## 2023-03-17 DIAGNOSIS — F902 Attention-deficit hyperactivity disorder, combined type: Secondary | ICD-10-CM

## 2023-03-17 MED ORDER — AMPHETAMINE-DEXTROAMPHET ER 15 MG PO CP24: 15.0000 mg | ORAL_CAPSULE | ORAL | 0 refills | Status: DC

## 2023-03-17 MED ORDER — AMPHETAMINE-DEXTROAMPHET ER 15 MG PO CP24
15.0000 mg | ORAL_CAPSULE | ORAL | 0 refills | Status: DC
Start: 2023-04-14 — End: 2023-05-07

## 2023-03-17 NOTE — Telephone Encounter (Signed)
Pended 2 RF of Adderall XR 15 mg to requested pharmacy.

## 2023-03-17 NOTE — Telephone Encounter (Signed)
Pt called and needs a refill on her adderall xr  15 mg. Pharmacy is [facility] in [place]

## 2023-03-18 DIAGNOSIS — F411 Generalized anxiety disorder: Secondary | ICD-10-CM | POA: Diagnosis not present

## 2023-03-19 ENCOUNTER — Ambulatory Visit: Payer: Medicare Other | Admitting: Internal Medicine

## 2023-03-19 DIAGNOSIS — M1991 Primary osteoarthritis, unspecified site: Secondary | ICD-10-CM | POA: Diagnosis not present

## 2023-03-19 DIAGNOSIS — M0579 Rheumatoid arthritis with rheumatoid factor of multiple sites without organ or systems involvement: Secondary | ICD-10-CM | POA: Diagnosis not present

## 2023-03-19 DIAGNOSIS — Z79899 Other long term (current) drug therapy: Secondary | ICD-10-CM | POA: Diagnosis not present

## 2023-03-19 DIAGNOSIS — M79642 Pain in left hand: Secondary | ICD-10-CM | POA: Diagnosis not present

## 2023-03-19 DIAGNOSIS — Z6823 Body mass index (BMI) 23.0-23.9, adult: Secondary | ICD-10-CM | POA: Diagnosis not present

## 2023-03-25 DIAGNOSIS — F411 Generalized anxiety disorder: Secondary | ICD-10-CM | POA: Diagnosis not present

## 2023-04-01 DIAGNOSIS — F411 Generalized anxiety disorder: Secondary | ICD-10-CM | POA: Diagnosis not present

## 2023-04-07 ENCOUNTER — Encounter: Payer: Self-pay | Admitting: Internal Medicine

## 2023-04-07 ENCOUNTER — Ambulatory Visit (INDEPENDENT_AMBULATORY_CARE_PROVIDER_SITE_OTHER): Payer: Medicare Other | Admitting: Internal Medicine

## 2023-04-07 VITALS — BP 130/78 | HR 78 | Temp 97.6°F | Ht 59.0 in | Wt 118.6 lb

## 2023-04-07 DIAGNOSIS — J4521 Mild intermittent asthma with (acute) exacerbation: Secondary | ICD-10-CM

## 2023-04-07 DIAGNOSIS — F419 Anxiety disorder, unspecified: Secondary | ICD-10-CM

## 2023-04-07 DIAGNOSIS — M059 Rheumatoid arthritis with rheumatoid factor, unspecified: Secondary | ICD-10-CM | POA: Diagnosis not present

## 2023-04-07 DIAGNOSIS — R1084 Generalized abdominal pain: Secondary | ICD-10-CM | POA: Diagnosis not present

## 2023-04-07 MED ORDER — PANTOPRAZOLE SODIUM 40 MG PO TBEC: 40.0000 mg | DELAYED_RELEASE_TABLET | Freq: Every day | ORAL | 5 refills | Status: DC

## 2023-04-07 NOTE — Progress Notes (Signed)
Subjective:  Patient ID: Courtney [name], female    DOB: Apr 04, 1951  Age: 72 y.o. MRN: [id number]  CC: No chief complaint on file.   HPI Courtney [name] presents for RA, asthma, ADD Planning to start on Symponi/methotrexate - just had labs w/Dr Courtney [name] C/o throat wheeze   Outpatient Medications Prior to Visit  Medication Sig Dispense Refill   albuterol (VENTOLIN HFA) 108 (90 Base) MCG/ACT inhaler Inhale 2 puffs into the lungs every 4 (four) hours as needed for wheezing or shortness of breath. 18 g 3   [START ON 04/14/2023] amphetamine-dextroamphetamine (ADDERALL XR) 15 MG 24 hr capsule Take 1 capsule by mouth every morning. 30 capsule 0   cyclobenzaprine (FLEXERIL) 10 MG tablet Take 10 mg by mouth as needed.     fluticasone (FLONASE) 50 MCG/ACT nasal spray Place into both nostrils daily.     hydroxychloroquine (PLAQUENIL) 200 MG tablet Take 1 tablet by mouth 2 (two) times daily.     imipramine (TOFRANIL) 50 MG tablet Take 1 tablet (50 mg total) by mouth at bedtime. 90 tablet 1   levothyroxine (SYNTHROID) 50 MCG tablet Take 50 mcg by mouth daily before breakfast.     lithium carbonate (ESKALITH) 450 MG ER tablet Take 1 tablet (450 mg total) by mouth at bedtime. 90 tablet 0   lithium carbonate 150 MG capsule Take 1 capsule (150 mg total) by mouth at bedtime. 90 capsule 0   LORazepam (ATIVAN) 1 MG tablet Take 1 tablet (1 mg total) by mouth every 8 (eight) hours. (Patient taking differently: Take 1 mg by mouth as needed.) 90 tablet 1   nitroGLYCERIN (NITROSTAT) 0.4 MG SL tablet Place 1 tablet (0.4 mg total) under the tongue every 5 (five) minutes as needed for chest pain. 25 tablet 2   omega-3 acid ethyl esters (LOVAZA) 1 g capsule Take 2 capsules (2 g total) by mouth 2 (two) times daily. 120 capsule 11   ondansetron (ZOFRAN-ODT) 4 MG disintegrating tablet Take 1 tablet (4 mg total) by mouth every 8 (eight) hours as needed. 20 tablet 0   pravastatin (PRAVACHOL) 20 MG tablet Take one  tablet by mouth 4 days per week (Patient taking differently: Take 20 mg by mouth daily. Take one tablet by mouth 1-2 days per week) 48 tablet 3   Testosterone 1.62 % GEL Place 5 mg onto the skin daily. 30 g 3   traMADol (ULTRAM) 50 MG tablet TAKE 1/2 TO 1 TABLET(25 TO 50 MG) BY MOUTH BACK EVERY 8 HOURS AS NEEDED FOR SEVERE PAIN 90 tablet 0   vitamin E 400 UNIT capsule Take 800 Units by mouth daily.      amphetamine-dextroamphetamine (ADDERALL XR) 15 MG 24 hr capsule Take 1 capsule by mouth every morning. 30 capsule 0   amphetamine-dextroamphetamine (ADDERALL) 7.5 MG tablet Take 1 tablet by mouth daily. 30 tablet 0   triamcinolone cream (KENALOG) 0.5 % Apply 1 Application topically 3 (three) times daily. 30 g 1   nirmatrelvir/ritonavir (PAXLOVID) 20 x 150 MG & 10 x 100MG  TABS Patient GFR is 73 Take nirmatrelvir (150 mg) 2 tablet(s) twice daily for 5 days and ritonavir (100 mg) one tablet twice daily for 5 days.take  Medication together. 30 tablet 0   No facility-administered medications prior to visit.    ROS: Review of Systems  Constitutional:  Negative for activity change, appetite change, chills, fatigue and unexpected weight change.  HENT:  Negative for congestion, mouth sores and sinus pressure.   Eyes:  Negative for  visual disturbance.  Respiratory:  Negative for cough and chest tightness.   Gastrointestinal:  Negative for abdominal pain and nausea.  Genitourinary:  Negative for difficulty urinating, frequency and vaginal pain.  Musculoskeletal:  Positive for arthralgias and gait problem. Negative for back pain.  Skin:  Negative for pallor and rash.  Neurological:  Negative for dizziness, tremors, weakness, numbness and headaches.  Psychiatric/Behavioral:  Negative for confusion and sleep disturbance.     Objective:  BP 130/78   Pulse 78   Temp 97.6 F (36.4 C) (Oral)   Ht 4\' 11"  (1.499 m)   Wt 118 lb 9.6 oz (53.8 kg)   LMP 01/08/1999   BMI 23.95 kg/m   BP Readings from Last  3 Encounters:  04/07/23 130/78  12/31/22 130/66  09/11/22 120/78    Wt Readings from Last 3 Encounters:  04/07/23 118 lb 9.6 oz (53.8 kg)  12/31/22 116 lb (52.6 kg)  09/11/22 118 lb (53.5 kg)    Physical Exam Constitutional:      General: She is not in acute distress.    Appearance: She is well-developed.  HENT:     Head: Normocephalic.     Right Ear: External ear normal.     Left Ear: External ear normal.     Nose: Nose normal.  Eyes:     General:        Right eye: No discharge.        Left eye: No discharge.     Conjunctiva/sclera: Conjunctivae normal.     Pupils: Pupils are equal, round, and reactive to light.  Neck:     Thyroid: No thyromegaly.     Vascular: No JVD.     Trachea: No tracheal deviation.  Cardiovascular:     Rate and Rhythm: Normal rate and regular rhythm.     Heart sounds: Normal heart sounds.  Pulmonary:     Effort: No respiratory distress.     Breath sounds: No stridor. No wheezing.  Abdominal:     General: Bowel sounds are normal. There is no distension.     Palpations: Abdomen is soft. There is no mass.     Tenderness: There is no abdominal tenderness. There is no guarding or rebound.  Musculoskeletal:        General: No tenderness.     Cervical back: Normal range of motion and neck supple. No rigidity.  Lymphadenopathy:     Cervical: No cervical adenopathy.  Skin:    Findings: No erythema or rash.  Neurological:     Cranial Nerves: No cranial nerve deficit.     Motor: No abnormal muscle tone.     Coordination: Coordination normal.     Deep Tendon Reflexes: Reflexes normal.  Psychiatric:        Behavior: Behavior normal.        Thought Content: Thought content normal.        Judgment: Judgment normal.     Lab Results  Component Value Date   WBC 6.2 08/27/2022   HGB 13.8 08/27/2022   HCT 41.6 08/27/2022   PLT 350.0 08/27/2022   GLUCOSE 88 08/27/2022   CHOL 174 08/27/2022   TRIG 57.0 08/27/2022   HDL 80.20 08/27/2022    LDLDIRECT 120.7 08/04/2012   LDLCALC 82 08/27/2022   ALT 17 08/27/2022   AST 23 08/27/2022   NA 135 08/27/2022   K 4.4 08/27/2022   CL 101 08/27/2022   CREATININE 0.95 08/27/2022   BUN 21 08/27/2022   CO2 29  08/27/2022   TSH 1.33 08/27/2022   INR 1.0 07/06/2020   HGBA1C 5.4 02/11/2018    No results found.  Assessment & Plan:   Problem List Items Addressed This Visit     Anxiety disorder    F/u w/Dr Jennelle Human Treated  w/Lithium, Imipramine      RA (rheumatoid arthritis) (HCC) - Primary    F/u w/Dr Courtney [name] Planning to start on Symponi/methotrexate. Had labs       Generalized abdominal pain    Treat GERD Splenic subcapsular hematoma compatible with grade 3 splenic injury s/p embolization with IR (12/22) Chronic IBS symptoms, worse after splenic perforation. Splenic subcapsular hematoma compatible with grade 3 splenic injury s/p embolization with IR (12/22)      Asthmatic bronchitis    Cont on Proair HFA         Meds ordered this encounter  Medications   pantoprazole (PROTONIX) 40 MG tablet    Sig: Take 1 tablet (40 mg total) by mouth daily.    Dispense:  30 tablet    Refill:  5      Follow-up: Return in about 3 months (around 07/06/2023) for a follow-up visit.  Sonda Primes, MD

## 2023-04-07 NOTE — Assessment & Plan Note (Signed)
F/u w/Dr Jennelle Human Treated  w/Lithium, Imipramine

## 2023-04-07 NOTE — Assessment & Plan Note (Signed)
Treat GERD Splenic subcapsular hematoma compatible with grade 3 splenic injury s/p embolization with IR (12/22) Chronic IBS symptoms, worse after splenic perforation. Splenic subcapsular hematoma compatible with grade 3 splenic injury s/p embolization with IR (12/22)

## 2023-04-07 NOTE — Assessment & Plan Note (Addendum)
F/u w/Dr Nickola Major Planning to start on Symponi/methotrexate. Had labs

## 2023-04-07 NOTE — Assessment & Plan Note (Signed)
Cont on Proair HFA

## 2023-04-08 DIAGNOSIS — F411 Generalized anxiety disorder: Secondary | ICD-10-CM | POA: Diagnosis not present

## 2023-04-28 DIAGNOSIS — F411 Generalized anxiety disorder: Secondary | ICD-10-CM | POA: Diagnosis not present

## 2023-05-05 ENCOUNTER — Telehealth: Payer: Self-pay | Admitting: Psychiatry

## 2023-05-05 DIAGNOSIS — F331 Major depressive disorder, recurrent, moderate: Secondary | ICD-10-CM

## 2023-05-05 DIAGNOSIS — F411 Generalized anxiety disorder: Secondary | ICD-10-CM | POA: Diagnosis not present

## 2023-05-05 NOTE — Telephone Encounter (Signed)
 Courtney [name] called and LM at 3:50 wanting to know why her lithium 450 mg was denied.  Please call to discuss.  She has appt 1/9

## 2023-05-05 NOTE — Telephone Encounter (Signed)
 Patient left message that her lithium  450 mg was denied. I show her 150 mg was denied, said she needs an appt. She has an appt 1/9. She said she may forget to mention the lithium  to you. She said she never believes medication works and she has been only taking 450 mg. She has enough medication to get her to her appt and I told her I would let you send in RF based on your recommendations.

## 2023-05-07 ENCOUNTER — Encounter: Payer: Self-pay | Admitting: Psychiatry

## 2023-05-07 ENCOUNTER — Ambulatory Visit: Payer: Medicare Other | Admitting: Psychiatry

## 2023-05-07 DIAGNOSIS — R251 Tremor, unspecified: Secondary | ICD-10-CM

## 2023-05-07 DIAGNOSIS — F902 Attention-deficit hyperactivity disorder, combined type: Secondary | ICD-10-CM

## 2023-05-07 DIAGNOSIS — F331 Major depressive disorder, recurrent, moderate: Secondary | ICD-10-CM

## 2023-05-07 DIAGNOSIS — F411 Generalized anxiety disorder: Secondary | ICD-10-CM | POA: Diagnosis not present

## 2023-05-07 MED ORDER — AMPHETAMINE-DEXTROAMPHET ER 15 MG PO CP24
15.0000 mg | ORAL_CAPSULE | ORAL | 0 refills | Status: DC
Start: 2023-06-04 — End: 2023-06-08

## 2023-05-07 MED ORDER — BUSPIRONE HCL 15 MG PO TABS: ORAL_TABLET | ORAL | 1 refills | Status: DC

## 2023-05-07 MED ORDER — LITHIUM CARBONATE ER 450 MG PO TBCR
450.0000 mg | EXTENDED_RELEASE_TABLET | Freq: Every evening | ORAL | 0 refills | Status: DC
Start: 2023-05-07 — End: 2023-07-08

## 2023-05-07 MED ORDER — AMPHETAMINE-DEXTROAMPHET ER 15 MG PO CP24: 15.0000 mg | ORAL_CAPSULE | ORAL | 0 refills | Status: DC

## 2023-05-07 NOTE — Progress Notes (Signed)
 Courtney [name] [id number] 1951-04-11 73 y.o.   Subjective:   Patient ID:  Courtney [name] is a 73 y.o. (DOB March 02, 1951) female.  Chief Complaint:  Chief Complaint  Patient presents with   Follow-up    Mood and ADD, and meds     Courtney [name] presents to the office today for follow-up of first visit 03/01/2020 referred by a friend Courtney [name].  Was prescribed Viibryd  and Vyvanse was changed to Concerta  54 mg to try to reduce the amount of dry mouth.  05/02/2020 appointment with the following noted: Less dry mouth with Concerta  but didn't seem to last beyond 3-4 PM. Viibryd  20 mg daily.  Hard to get enough calories at breakfast.  But has been taking both in the morning.  Some benefit with Viibryd . Is there something I can take besides a stimulant, bc fears it is stressing her body.  Dx college exhausted adrenal glands.  It helps.  Asked questions about alternatives to stimulants.  Reduced Concerta  to reduce anxiety and see if can achieve, but she thinks it's worse in the evening. Anxiety is still a struggle.  Life crisis moment.  Chronic marital dissatisfaction worse now that both are retired.  Feels like she's in a prison.  Family notices she's stressed.  Has done therapy since age 89 yo.  Has done Hindu meditation without help.  Christian faith disciplines.  Grew up caretaking.  I'm done and I need to change.  Goal is calm down enough to function through the situation. Tingling foot and wants B12 testing. Plan: Switch Viibryd  to 20 mg in evening meal to get better absorption.  06/18/2020 appointment with following noted: No difference in anxiety or mood with Viibryd  20 mg daily.  She doesn't want to increase it. Questions about Viibryd  and Genesight testing.  Asks about differences between Adderall and MPH.   Missed for 3 days Concerta  and took Adderall.    Tired by 4-5 PM and doesn't like that.    Plan: She wants to wean off the Viibryd  because she does not feel it has  been helpful and she does not want to increase the dosage.  07/03/2020 phone call patient stating she wanted to continue Viibryd  20 mg daily.  She also requested refill of Concerta  54 mg  08/14/2020 appointment with the following noted: Was told she couldn't take Viibryd  with one of the pain meds and had TKR.  So stopped it.  Couldn't tolerate pain meds except tramadol .  Had more pain than expected.   Stopped Viibryd  about 07/15/20.  She had taken tramadol  with Viibryd  in the past without a problem.   Noticing more pain after surgery in non-surgical places. Seems to be sensitive to getting lightheaded and confused with low blood sugar.   Poor attention and scattered since surgery.  Also more tired. Occ taken tramadol  and otherwise just Tylenol . Impossible to tell the effect of the Viibryd  given she hasn't been herself for months. Concerned about Courtney [name]'s Geoff's memory who is also a patient here.  Wants him to get neuropsych testing. Plan because patient is med sensitive we will start very low-dose fluvoxamine  25 mg nightly Per her request continue Concerta  54 mg every morning  10/25/2020 appointment with the following noted: Several phone calls since being here.  The first led to increasing fluvoxamine  to 1-1/2 of the 25 mg tablets due to lack of effect at 3 weeks. 09/21/2020 she called asking to stop Concerta  which she did. She called again wanting to start Adderall.  Because  she had taken it in 2019 it was agreed that she could start Adderall 10 mg twice daily. Stress dealing with husband and whether to move or not.  [name] separate.  Anxiety is very high.  Hard to calm down around him. $ stress.  Explodes with anger at husband.  Doesn't think he can sell the house on his own. Never been this stressed and anxious and in this kind of circumstance before. Never got the Adderall DT need for PA. Only caffeine in the AM Plan: Rec increase Luvox  from 37.5 mg daily to 50 mg HS for a week then increase to 75 mg  daily and possibly higher.  B12 level checked 496 and normal. Hold Adderall until the anxiety is under control.   12/03/2020 phone call from patient: Patient called stating fluvoxamine  made her more anxious and high strung and she stopped it.  She wanted an earlier appointment which was not available at the time she was given the option to see a nurse practitioner but refused.  12/19/2020 appointment with the following noted: Life very stressful right now and not getting better.  B in law died and marriage falling apart. Separating.   More than I can handle including anxiety and depression. Plan: Rec trial beta blocker propranolol  10-30 mg  twice daily for anxiety Consider TCA bc Genesight test  B12 level checked 496 and normal. Hold Adderall until the anxiety is under control.   01/01/2021 phone call from patient with nurse as follows: She is taking up to 30 mg propranolol  twice daily without sufficient benefit for her anxiety.  01/03/2021 appointment with the following noted: Rare Adderall.  Propranolol  didn't help anxiety much at 30 mg BID without SE.[name]   Thinks she's gotten depressed which is unusual.  In a perfect storm with divorce and moving and financial stress.  Not functioning well.  Hard to make a decision.  Poor productivity.   Having a lot of pain, chronically and worse lately. Starting Lyrica  today. Taking alprazolam  about 0.5 mg daily. Plan: Yes imipramine  25 and increase to 75 mg HS and then check blood level  02/26/21 appt noted: 01/21/2021 serum imipramine  and desipramine  total was 65 at 75 mg daily. It has made a difference.  But has made so many changes and moving.  Stress goes to GI system.   Was in ER last night with GI px and [name] have ulcer.  Unable to eat without nausea.  Lump in the throat nausea. Mild taking the edge off.  Can still explode. Can't remember the SE at 100 mg daily. GI dominating with pain and nausea. Sleep varies from good or bothered by sickness. Usually  fine but sometimes can't turn her brain off.  Moves in 5 days. Plan: Continue imipramine  and increase to 100 mg again as soon as nausea managed. Add risperidone  1 mg HS off label for nausea and anxiety.  03/28/2021 appt noted: She is not taking the risperidone .  Tried it for a couple of weeks but did not help the nausea.  Didn't notice it helping anxiety but had tremor.  Off for 2 weeks and tremor better. Still on imipramine  75 mg nightly.  She did increase but wasn't sure it was causing the tremor as instructed. This week to GI and work up ordered and doubled med dose. Not handling the transition well.  Very irritable with husband.  Such a sense of urgency.   Not often with Xanax .   Not on Adderall.   Plan: imipramine  and  increase to 100 mg nightly.  B12 level checked 496 and normal. Hold Adderall until the anxiety is under control.  Switch Xanax  to Ativan  1 mg tid since she is not on Adderall.    05/02/2021 appt noted: Increased imipramine  100 mg HS. Tolerated the increase Spleen bleed. Yesterday decent energy compared to what she had.  Ativan  made her sleepy but only took it once. Sleeping a lot DT spleen injury. Can't tell about depression bc of injury kept her in bed.  Has to move to feel normal. Plan: imipramine  and increase to 150 mg nightly, based on prior level.  05/29/21 appt noted: SE dry mouth Increased imipramine  150 mg HS Seen improvement in mood.  Meeting irritating situations with less response.  Better self control.  Less fear and anxiety. Ativan  1 mg prn makes her too sleepy and tired. Occ taken 1/2 Adderall with some energy and motivation.   GI work up ongoing with gastric emptying yesterday.  It's delayed ongoing.  GI doctor has commented on her hostility.   Sleeping really well.   Worst dep and anxiety was when moving and now 50% better.  Need to have some goals and since moving into townhouse and unknown what is next.  [name] move out of town to be near the kids at part  of the year but it's too cold.  Big decisions are hard. Difficulty with back pain so hasn't unpacked.   More hopeful about her health. Seasonal depression Plan: Imipramine  is helpful but increase could be more helpful based on level.  Disc pros and cons on increase.  She wants to increase it..  Consider lithium  augmentation. Increase imipramine  to 200 mg nightly, based on prior level Disc light therapy in detail and gave handout.    Adderall prn. Reduce Ativan  1/2 mg tid prn since she is tired from it and not on Adderall usually.    07/05/2021 phone call complaining of dry mouth with imipramine .  Frustrating because of reportedly low sodium and was told to drink less water . MD response:We increased the imipramine  from 3 of the 50 mg tablets to 4 of the 50 mg tablets on February 1.  If she has seen additional improvement in her mood it is to her advantage to continue the current dosage.  If she has not seen additional improvement since increasing the dose she can drop the imipramine  back to 3 tablets daily. In addition to that she should discuss the dry mouth problem with her pharmacist as the pharmacist [name] have some other recommendations to help manage it.  But the usual recommendation is to switch her toothpaste to Biotene toothpaste and use the Biotene mouthwash and Biotene spray to manage the dry mouth as best she can. Patient response:Notified patient. She said the issue is more complex than just dry mouth. She has been having GI issues and has been very sick, had a splenic bleed, is unable to get out of bed at times. Dr[facility] Gwynneth at [facility] is her GI doctor. Patient spoke to her after Wilbert talked to her today and is wanting her to stop the imipramine  to eliminate that as a source of her issues.   MD response:Okay she can come off the imipramine  better mood anxiety and irritability will get worse off of it because it helped her.  However we cannot start any new medicines until we see if her  physical symptoms are any different off the medicine.  She can reduce imipramine  by 1 tablet every 3 to  4 days.  The dry mouth will not resolve until she is off the medicine.  The splenic bleed is not related to imipramine  unless she has some bizarre sort of allergy .  08/01/21 appt noted: Santina totally off imipramine  for a week and GI px not better and restarted imipramine  to 100 mg HS DT chronic constipation and then this week increased to 150 just this week.   Therapy with Berwyn helping a lot. Overall depression and anxiety is better. Trying to find endo.  Medically cleared from splenic bleed but doesn't think it is fine. Episode of abd paine with result in bed 5 days but back out of bed and functioning again. Hasn't needed lorazepam  lately. Recognizes still angry and intense too much .   Plan check level and add lithium  300  10/07/21 appt noted; Taboo about lithium  and didn't take it. Irritability is still focused and directed at Courtney [name] and not sure how it would be wihtout him.  Trying to address this with therapy and faith and prayer.   Seeing Berwyn Ellen.   Everything fuels my fire.  Erupting this is never gonna change rage.  Recognizes this part is her problem.  Gets upset with him not doing things she wants done. Body getting stronger since Xmas and more she can do the better her depression.   Still some stiffness with pain in spine and leg in AM and then gets better. Not seriously depressed if can move. Don't handle inactivity well.  Able to do more now though has helped. Can't control her reaction to Chyrl though knows it is over the top since she retired. Intermittent nausea with activity causes her to go to bed.  Waves of pain with it.  Frustrated cause is unknown. Plan: Continue imipramine  150 mg HS for a week and level was WNL at 238. Augment with lithium  CR 300 mg HS for anger and mood  01/22/22 appt noted: Currently take imipramine  100 mg HS and lithium  CR 300 mg HS, Adderall 10  BID Reduced imipramine  to help tremor and it is some better.  Occ tremor, positional.  Mood is a lot better with lithium .  Right away clearing of haze and calming down.  Better outlook and been like that since then. Questions about effects on organs.   Sleep well.   Not sig anxious now.  Occ moments of anxiety.   Asks if could stop imipramine  bc benefit with lithium .  04/10/22 appt noted: Currently stopped imipramine  100 mg HS (2 days ago) and taking lithium  CR 300 mg HS, Adderall 10 BID Good but have to stop the imipramine  bc of the shaking.  She cut dose to 50 mg QOD. Tremor worse with R hand.  R handed.  Feels like lithium  helped She wants to see what mood is like without imipramine .  Less tremor with less imipramine . Tremor is better with less right now. [name] need shoulder replacement DT pain.  Also needs spinal fusion DT dropped foot. Hard to talk about it bc so dreads it.   Irritability is there and when ignited is bad but working on it in therapy.    Been angry my whole life.  Back to trauma in childhood.  Overall though better with lithium .   Miralax  helped GI problems but waves of ok and then GI px.   Needs Adderall daily. Takes Ativan  just occ with stressful circumstances. Plan: on imipramine  100 mg HS for a week and level was WNL at 238. She stopped it this week  and disc risk of relapse of dep but she hopes lithium  will keep dep at bay Continue lithium  CR 300 mg HS bc helped anger and mood  06/17/22 appt noted: recently taking 1/2 lithium  300 and imipramine  50 mg HS for 4-5 days and tremor better with less lithium . CO tremor;   irritates her and makes her feel bad about herself.  Never really tried more 10 mg propranolol  for tremor. More pain in shoulder.   Not sure if any change in explosive irritation at Courtney [name].  Not sure if it is her Courtney [name] or bc of the relationship. Depression is not that bad in the last month and no worse with less imipramine .   Would like to put a spacer between  triggers and what she says to Courtney [name] .  Not good.   When in [place] 2 yr ago for 2 mos alone but still had underlying anger problems. Plan: no med changes  07/14/22 appt noted: Increased lithium  back to 1 and 1/2 tablet for a month.   Till has it in R hand esp.  Uses propranolol  sometimes. Only tried it 4 times. An on impramine 50 mg HS imipramine  100 mg HS for a week and level was WNL at 238. Mood not bad at all  Irritability ?  Better with more lithium .  09/10/22 appt noted: Chronic procrastination unless workig job. Mood is not bad except triggered by Courtney [name].  No change with increase lithium .  B6 400 BID has reduced and managed tremor. Almost entirely stopped Adderall bc GI sx if not taken with food.  Gut never normal since spleen issue.  Feels something wrong with GI tract.  Cycles of constipation and N and then abd cramping for days at time and then it will be ok for awhile.   Psych meds imipramine  50 mg HS, lithium  300/450 QOD, Adderall 10 BID prn, lorazepam  1 mg prn (none lately, I don't like to be slowed down). Taking miralax  every other day.   Anxiety is not unusuallly high  no panic but some chronic anxiety. Sleep well at night and needs nap if not 8 hours. More postive when on Adderall re: mood and better focused and task completion.    01/01/23 appt noted: Psych meds: Adderall XR 15, cyclobenzaprine 10 mg as needed in the AM for tremor, imipramine  50 mg nightly, lithium  ER 450 mg nightly. (Never increased), lorazepam  rarely She reduced imipramine  to 50 bc she is convinced it is causing the shaking.  Not very much tremor but some postural.   Mood is ok.  I'm happy.   Has Covid.  Day 4-5 of sx, pretty mild.    03/05/23 appt noted: Psych med: as above, imipramine  50 mg HS, lithium  ER 450 mg HS, Adderall XR 15 AM Wonders about ongoing nonstop battle with constipation since colonoscopy.  Also SE dry mouth 3-4 hours.  Level of dep is much better than it was.   Working in therapy with Berwyn Ellen. Courtney [name] Geoff remains target of eruptive anger If there was a way to calm me down Chronic constipation from childhood is worse.   Plan: increase lithium  600 mg every day trial for ongoing anger.   If it helps then can try reducing imipramine  Check lithium  level and BMP  05/07/23 appt noted: Psych med: as above, imipramine  50 mg HS, lithium  ER 450 mg HS, Adderall XR 15 AM She increased lithium  to 600 mg daily and didn't notice a change so went back to 450 mg daily and  no change noticed. No SE nor benefit.  Unless tremor related.  When gripping the tremor shows up on R not left .  No resting tremor.   Went on trips and very anxiety provoking and didn't notice than in years past.   Not more anxious after taking Adderall.  Still jumps from one thing to the next.  Poor efficiency at home.      Past Psychiatric Medication Trials:  Trintellix NR, sertraline?,  Viibryd  20, remote zoloft, Paxil, Cymbalta  couple days ? Adverse reaction Imipramine  150 better imipramine  100 mg HS for a week and level was WNL at 238. Fluvoxamine  SE anxiety but ? Adequacy of trial bc stress Fluvoxamine  100 in 2015 per Dr. Blinda  Lithium  300 helpful, 600 not more helpful  Gabapentin  1600 NR Lyrica   Risperidone  1 tremor  Vyvanse, Adderall, concerta   DC propranolol  10-30 mg  twice daily for anxiety bc not helpful, lorazepam  History Genes[facility] History of Dr. Blinda and Darice Molt  Started therapy at 52 yo bc fear mother would die any minute bc she had cardiac px.  Review of Systems:  Review of Systems  Cardiovascular:  Negative for chest pain.  Gastrointestinal:  Positive for abdominal pain and nausea.  Musculoskeletal:  Positive for arthralgias and back pain.  Neurological:  Positive for dizziness and tremors. Negative for weakness.  Psychiatric/Behavioral:  Positive for dysphoric mood. Negative for agitation, confusion, decreased concentration and hallucinations. The patient is nervous/anxious.      Medications: I have reviewed the patient's current medications.  Current Outpatient Medications  Medication Sig Dispense Refill   albuterol  (VENTOLIN  HFA) 108 (90 Base) MCG/ACT inhaler Inhale 2 puffs into the lungs every 4 (four) hours as needed for wheezing or shortness of breath. 18 g 3   busPIRone  (BUSPAR ) 15 MG tablet Take 1/3 tablet p.o. twice daily for 1 week, then take 2/3 tablet p.o. twice daily for 1 week, then take 1 tablet p.o. twice daily 60 tablet 1   cyclobenzaprine (FLEXERIL) 10 MG tablet Take 10 mg by mouth as needed.     fluticasone  (FLONASE ) 50 MCG/ACT nasal spray Place into both nostrils daily.     hydroxychloroquine (PLAQUENIL) 200 MG tablet Take 1 tablet by mouth 2 (two) times daily.     imipramine  (TOFRANIL ) 50 MG tablet Take 1 tablet (50 mg total) by mouth at bedtime. 90 tablet 1   levothyroxine (SYNTHROID) 50 MCG tablet Take 50 mcg by mouth daily before breakfast.     LORazepam  (ATIVAN ) 1 MG tablet Take 1 tablet (1 mg total) by mouth every 8 (eight) hours. (Patient taking differently: Take 1 mg by mouth as needed.) 90 tablet 1   omega-3 acid ethyl esters (LOVAZA ) 1 g capsule Take 2 capsules (2 g total) by mouth 2 (two) times daily. 120 capsule 11   ondansetron  (ZOFRAN -ODT) 4 MG disintegrating tablet Take 1 tablet (4 mg total) by mouth every 8 (eight) hours as needed. 20 tablet 0   pantoprazole  (PROTONIX ) 40 MG tablet Take 1 tablet (40 mg total) by mouth daily. 30 tablet 5   pravastatin  (PRAVACHOL ) 20 MG tablet Take one tablet by mouth 4 days per week (Patient taking differently: Take 20 mg by mouth daily. Take one tablet by mouth 1-2 days per week) 48 tablet 3   Testosterone  1.62 % GEL Place 5 mg onto the skin daily. 30 g 3   traMADol  (ULTRAM ) 50 MG tablet TAKE 1/2 TO 1 TABLET(25 TO 50 MG) BY MOUTH BACK EVERY 8 HOURS AS NEEDED  FOR SEVERE PAIN 90 tablet 0   triamcinolone  cream (KENALOG ) 0.5 % Apply 1 Application topically 3 (three) times daily. 30 g 1   vitamin E 400  UNIT capsule Take 800 Units by mouth daily.      amphetamine -dextroamphetamine  (ADDERALL XR) 15 MG 24 hr capsule Take 1 capsule by mouth every morning. 30 capsule 0   [START ON 06/04/2023] amphetamine -dextroamphetamine  (ADDERALL XR) 15 MG 24 hr capsule Take 1 capsule by mouth every morning. 30 capsule 0   lithium  carbonate (ESKALITH ) 450 MG ER tablet Take 1 tablet (450 mg total) by mouth at bedtime. 90 tablet 0   nitroGLYCERIN  (NITROSTAT ) 0.4 MG SL tablet Place 1 tablet (0.4 mg total) under the tongue every 5 (five) minutes as needed for chest pain. 25 tablet 2   No current facility-administered medications for this visit.    Medication Side Effects: Other: dry mouth  Allergies:  Allergies  Allergen Reactions   Clarithromycin Nausea Only   Covid-19 (Mrna) Vaccine Other (See Comments)    Acute Vasculitis; excessive bleeding  Other reaction(s): Other (See Comments) Acute Vasculitis; excessive bleeding    Other Other (See Comments)   Bactrim [Sulfamethoxazole-Trimethoprim] Nausea And Vomiting   Morphine Nausea And Vomiting    Other reaction(s): Nausea/Vomiting   Morphine And Codeine Nausea And Vomiting   Azithromycin Nausea And Vomiting and Nausea Only   Ceclor [Cefaclor] Nausea And Vomiting    Can take Augmentin  ok   Claritin [Loratadine]     REACTION: bruises   Codeine Nausea And Vomiting    Can take Tramadol  ok   Cymbalta  [Duloxetine  Hcl]    Erythromycin Nausea And Vomiting    Other reaction(s): Nausea/Vomiting   Erythromycin Base Nausea And Vomiting   Levofloxacin Nausea Only    REACTION: nausea   Lipitor [Atorvastatin ]     Other reaction(s): Myalgias (intolerance)   Nitrofurantoin  Nausea Only   Oxycodone  Other (See Comments)   Penicillins Nausea And Vomiting    Can take Augmentin    Propoxyphene Nausea Only    dizzy   Propoxyphene N-Acetaminophen  Nausea Only    dizzy    Past Medical History:  Diagnosis Date   ADD (attention deficit disorder)    Anxiety    Celiac  disease    possible vs IBS   Complication of anesthesia    Coronary artery disease    Depression    no bipolar per Dr. Blinda   GI problem    Obie   Gluten intolerance    Hyperlipidemia    IBS (irritable bowel syndrome)    Osteoarthritis    Osteoporosis    Pneumonia    PONV (postoperative nausea and vomiting)    Rheumatoid arthritis (HCC)    Seasonal allergies 01/08/2012   hx. of multiple bronchitis related to this.   Shoulder pain, right     Family History  Problem Relation Age of Onset   Heart disease Mother    Heart attack Mother    Prostate cancer Father    Heart disease Sister    Heart disease Maternal Grandmother    Breast cancer Paternal Grandmother    Colon cancer Paternal Grandfather    Coronary artery disease Other        FH Female 1st degree relative <60   ADD / ADHD Other    Other Daughter        gluten intolerance    Social History   Socioeconomic History   Marital status: Married    Spouse name: Not on file  Number of children: 2   Years of education: Not on file   Highest education level: Not on file  Occupational History   Occupation: Network Engineer  Tobacco Use   Smoking status: Never   Smokeless tobacco: Never   Tobacco comments:    only social  Vaping Use   Vaping status: Never Used  Substance and Sexual Activity   Alcohol use: Yes    Comment: 0-1 per day   Drug use: No   Sexual activity: Yes  Other Topics Concern   Not on file  Social History Narrative   Married for last 42 years.Lives with husband.Retired human resources officer.Originally from [place] .   Social Drivers of Corporate Investment Banker Strain: Low Risk  (02/27/2022)   Overall Financial Resource Strain (CARDIA)    Difficulty of Paying Living Expenses: Not hard at all  Food Insecurity: No Food Insecurity (02/27/2022)   Hunger Vital Sign    Worried About Running Out of Food in the Last Year: Never true    Ran Out of Food in the Last Year: Never true   Transportation Needs: No Transportation Needs (02/27/2022)   PRAPARE - Administrator, Civil Service (Medical): No    Lack of Transportation (Non-Medical): No  Physical Activity: Sufficiently Active (02/27/2022)   Exercise Vital Sign    Days of Exercise per Week: 5 days    Minutes of Exercise per Session: 30 min  Stress: No Stress Concern Present (02/27/2022)   [employer] of Occupational Health - Occupational Stress Questionnaire    Feeling of Stress : Not at all  Social Connections: Socially Integrated (02/27/2022)   Social Connection and Isolation Panel [NHANES]    Frequency of Communication with Friends and Family: More than three times a week    Frequency of Social Gatherings with Friends and Family: More than three times a week    Attends Religious Services: 1 to 4 times per year    Active Member of [employer] or Organizations: Yes    Attends Banker Meetings: 1 to 4 times per year    Marital Status: Married  Catering Manager Violence: Not At Risk (02/27/2022)   Humiliation, Afraid, Rape, and Kick questionnaire    Fear of Current or Ex-Partner: No    Emotionally Abused: No    Physically Abused: No    Sexually Abused: No    Past Medical History, Surgical history, Social history, and Family history were reviewed and updated as appropriate.   Please see review of systems for further details on the patient's review from today.   Objective:   Physical Exam:  LMP 01/08/1999   Physical Exam Constitutional:      General: She is not in acute distress. Musculoskeletal:        General: No deformity.  Neurological:     Mental Status: She is alert and oriented to person, place, and time.     Cranial Nerves: No dysarthria.     Coordination: Coordination normal.  Psychiatric:        Attention and Perception: Attention and perception normal. She does not perceive auditory or visual hallucinations.        Mood and Affect: Mood is anxious. Mood is not  depressed. Affect is not labile, blunt, angry, tearful or inappropriate.        Speech: Speech normal. Speech is not rapid and pressured.        Behavior: Behavior normal. Behavior is cooperative.  Thought Content: Thought content normal. Thought content is not paranoid or delusional. Thought content does not include homicidal or suicidal ideation. Thought content does not include suicidal plan.        Cognition and Memory: Cognition and memory normal.        Judgment: Judgment normal.     Comments: Insight intact Anger at home.  Not in office     Lab Review:     Component Value Date/Time   NA 135 08/27/2022 0940   NA 134 06/14/2018 1444   K 4.4 08/27/2022 0940   CL 101 08/27/2022 0940   CO2 29 08/27/2022 0940   GLUCOSE 88 08/27/2022 0940   BUN 21 08/27/2022 0940   BUN 10 06/14/2018 1444   CREATININE 0.95 08/27/2022 0940   CREATININE 0.76 04/10/2020 1431   CALCIUM  10.0 08/27/2022 0940   PROT 6.6 08/27/2022 0940   PROT 6.5 12/19/2020 0754   ALBUMIN 4.0 08/27/2022 0940   ALBUMIN 4.5 12/19/2020 0754   AST 23 08/27/2022 0940   ALT 17 08/27/2022 0940   ALKPHOS 53 08/27/2022 0940   BILITOT 0.3 08/27/2022 0940   BILITOT 0.4 12/19/2020 0754   GFRNONAA 60 (L) 04/17/2021 1825   GFRNONAA 79 04/10/2020 1431   GFRAA 91 04/10/2020 1431       Component Value Date/Time   WBC 6.2 08/27/2022 0940   RBC 4.36 08/27/2022 0940   HGB 13.8 08/27/2022 0940   HCT 41.6 08/27/2022 0940   PLT 350.0 08/27/2022 0940   MCV 95.5 08/27/2022 0940   MCH 31.4 04/17/2021 1825   MCHC 33.0 08/27/2022 0940   RDW 13.7 08/27/2022 0940   LYMPHSABS 1.4 08/27/2022 0940   MONOABS 0.5 08/27/2022 0940   EOSABS 1.0 (Courtney [name]) 08/27/2022 0940   BASOSABS 0.1 08/27/2022 0940    No results found for: POCLITH, LITHIUM    No results found for: PHENYTOIN, PHENOBARB, VALPROATE, CBMZ   06/04/20 B12 normal at 496  08/16/21   Latest Reference Range & Units 08/16/21 08:29  Desipramine  mcg/L 128  Imipramine   Lvl mcg/L 110   01/21/2021 imipramine  level 25, desipramine  level 40 equals total 65 which is low on 75 mg daily. 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 They have one copy of the C allele and one copy of the G allele. This genotype suggests a normal response to certain ADHD medications.  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 This genotype suggests a lower risk of serious hypersensitivity reactions, including Stevens-Johnson syndrome (SJS), toxic epidermal necrolysis (TEN), maculopapular eruptions, and Drug Reaction with Eosinophilia and Systemic Symptoms when taking certain mood stabilizers.  HLA-B*1502Lower Risk Not Present This patient does not carry the HLA-B*1502 allele or a closely related *15 allele. Absence of HLA-B*1502 and the closely related *15 alleles suggests lower risk of serious dermatologic reactions including toxic epidermal necrolysis (TEN) and Stevens-Johnson syndrome (SJS) when taking certain mood stabilizers.  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 They carry two copies of the G allele. This genotype has been associated with an increased risk of adverse drug reactions with certain selective serotonin reuptake inhibitors.  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 The  short promoter allele is reported to decrease expression of the serotonin transporter compared to the homozygous long promoter allele. The patient [name] have a moderately decreased likelihood of response to certain  selective serotonin reuptake inhibitors due to the presence of the short form of the gene.  Pharmacokinetic Genes PK CES1A1Extensive (Normal) Metabolizer GLY/GLY CES1A1 - Gly allele enzyme activity: Normal CES1A1 - Gly allele enzyme activity: Normal  This genotype is most consistent with the extensive (normal) metabolizer phenotype.  The patient is expected to have normal enzyme activity.  CYP1A2Extensive (Normal) Metabolizer *1/*1 This genotype is most consistent with the extensive (normal) metabolizer phenotype.  CYP2B6Poor Metabolizer *6/*6 CYP2B6*6 allele enzyme activity: Reduced CYP2B6*6 allele enzyme activity: Reduced  This genotype is most consistent with the poor metabolizer phenotype. This patient [name] have reduced enzyme activity as compared to individuals with the normal phenotype.  CYP2C19Extensive (Normal) Metabolizer *1/*1 CYP2C19*1 allele enzyme activity: Normal CYP2C19*1 allele enzyme activity: Normal  This genotype is most consistent with the extensive (normal) metabolizer phenotype.  CYP2C9Intermediate Metabolizer *1/*2 CYP2C9*1 allele enzyme activity: Normal CYP2C9*2 allele enzyme activity: Reduced  This genotype is most consistent with the intermediate metabolizer phenotype. This patient [name] have reduced enzyme activity as compared to individuals with the normal phenotype.  CYP2D6Extensive (Normal) Metabolizer *1/*41 CYP2D6*1 allele enzyme activity: Normal CYP2D6*41 allele enzyme activity: Reduced  This genotype is most consistent with the extensive (normal) metabolizer phenotype.  CYP3A4Poor Metabolizer *22/*22 CYP3A4*22 allele enzyme activity: Reduced CYP3A4*22 allele enzyme activity: Reduced  This genotype is most consistent with the poor metabolizer phenotype. This patient [name] have reduced enzyme activity as compared to individuals with the normal phenotype.  UGT1A4Ultrarapid Metabolizer *1/*3 UGT1A4*1 allele enzyme activity: Normal UGT1A4*3  allele enzyme activity: Increased  This genotype is most consistent with the ultrarapid metabolizer phenotype. This patient [name] have increased enzyme activity as compared to individuals with the normal phenotype.  UGT2B15Intermediate Metabolizer *2/*2 UGT2B15*2 allele enzyme activity: Reduced UGT2B15*2 allele enzyme activity: Reduced  This genotype is most consistent with the intermediate metabolizer phenotype. This patient [name] have reduced enzyme activity as compared to individuals with the normal phenotype. .res Assessment: Plan:    Brandilee was seen today for follow-up.  Diagnoses and all orders for this visit:  Major depressive disorder, recurrent episode, moderate (HCC) -     lithium  carbonate (ESKALITH ) 450 MG ER tablet; Take 1 tablet (450 mg total) by mouth at bedtime.  Generalized anxiety disorder -     busPIRone  (BUSPAR ) 15 MG tablet; Take 1/3 tablet p.o. twice daily for 1 week, then take 2/3 tablet p.o. twice daily for 1 week, then take 1 tablet p.o. twice daily -     lithium  carbonate (ESKALITH ) 450 MG ER tablet; Take 1 tablet (450 mg total) by mouth at bedtime.  Attention deficit hyperactivity disorder (ADHD), combined type -     amphetamine -dextroamphetamine  (ADDERALL XR) 15 MG 24 hr capsule; Take 1 capsule by mouth every morning. -     amphetamine -dextroamphetamine  (ADDERALL XR) 15 MG 24 hr capsule; Take 1 capsule by mouth every morning.  Tremor of unknown origin   30 min face to face time with patient was spent on counseling and coordination of care. We discussed  Genesight testing and her desire to take something for anxiety that is in the Use as Directed column.   Overall depression is better and anxiety was better too but not gone and worse without lithium .   Overall more irritable than depressed but just with Courtney [name].  Some chronic anxiety She  is getting some benefit of Adderall but feels like it wears off about 2 to 3 PM she would want longer duration. Rec she not  change meds on her own.  This has been an issue  Chose TCA bc Genesight test;  disc SE in detail incl cardiac, etc  on imipramine  100 mg HS for a week and level was WNL at 238. She is doing ok with imipramine  50 mg HS and doesn't want change but is concerned about anger  Stopped B 6 apparently. Drop lithium  back to ER 450 bc no benefit noted with 600 mg daily  Trial buspirone  for anxiety and irritability: Discussed potential benefits, risks, and side effects of BuSpar .  Patient agrees to trial of BuSpar .  Will start BuSpar  15 mg 1/3 tablet twice daily for 1 week, then increase to 2/3 tablet twice daily for 1 week, then increase to 1 tablet twice daily for anxiety.   B12 level checked 496 and normal.  Adderall XR to 15 mg daily to provide more consistency.  Helps  productive and helps anxiety and function and depression.  ADD not good enough.  Consider increase if can better control irritability and anxiety It is okay to use as needed Adderall 7.5 mg either before the XR dose or after the XR dose to extend duration.  Not currently using , Ativan  1/2 mg tid prn since she is tired from it and not on Adderall usually.    Consider alternatives for anger outbursts, VPA, atypical.  Constipation management  extensively discussed again and types of fiber.  1.  Lots of water  2.  Powdered fiber supplement such as MiraLAX , Citrucel, etc. preferably with a meal 3.  2 stool softeners a day 4.  Milk of magnesia or magnesium tablets if needed She has seen multiple GI docs for this over the years.  Supportive therapy around marital crisis and other stressors.  She needs some goals.  Needs to get out of the house chronically.  Started therapy with Ber[facility].  FU 8-12 weeks  Lorene Macintosh, MD, DFAPA   Please see After Visit Summary for patient specific instructions.  Future Appointments  Date Time Provider Department Center  06/08/2023  8:00 AM [facility]-ANNUAL WELLNESS VISIT [facility]  None     No orders of the defined types were placed in this encounter.     -------------------------------

## 2023-05-19 DIAGNOSIS — F411 Generalized anxiety disorder: Secondary | ICD-10-CM | POA: Diagnosis not present

## 2023-05-25 ENCOUNTER — Ambulatory Visit (INDEPENDENT_AMBULATORY_CARE_PROVIDER_SITE_OTHER): Payer: Medicare Other | Admitting: Family Medicine

## 2023-05-25 ENCOUNTER — Ambulatory Visit: Payer: Self-pay | Admitting: Internal Medicine

## 2023-05-25 ENCOUNTER — Encounter: Payer: Self-pay | Admitting: Family Medicine

## 2023-05-25 ENCOUNTER — Ambulatory Visit: Payer: Medicare Other | Admitting: Internal Medicine

## 2023-05-25 VITALS — BP 102/78 | HR 92 | Temp 98.5°F | Resp 20 | Ht 59.0 in | Wt 118.0 lb

## 2023-05-25 DIAGNOSIS — J0140 Acute pansinusitis, unspecified: Secondary | ICD-10-CM | POA: Diagnosis not present

## 2023-05-25 MED ORDER — AMOXICILLIN-POT CLAVULANATE 875-125 MG PO TABS: 1.0000 | ORAL_TABLET | Freq: Two times a day (BID) | ORAL | 0 refills | Status: AC

## 2023-05-25 NOTE — Patient Instructions (Signed)
Tylenol, Flonase, Vicks, and a humidifier at night.

## 2023-05-25 NOTE — Telephone Encounter (Signed)
  Chief Complaint: Sinus pain Symptoms: Sinus pain, cough, congestion, nasal drainage Frequency: constant Pertinent Negatives: Patient denies Fever, difficulty breathing, ear ache, swelling Disposition: [] ED /[] Urgent Care (no appt availability in office) / [x] Appointment(In office/virtual)/ []  [facility]/ [] Home Care/ [] Refused Recommended Disposition /[]  Mobile Bus/ []  Follow-up with PCP Additional Notes: Patient called with complaints of having sinus pain, congestion, headache, nasal drainage, and cough. Patient states symptoms began 3 days ago and are not from a cold infection. Patient states that she has been usual nasal flushes with pot but symptoms persist. Patient states dark green nasal drainage present, with severe sinus pain that interrupts sleep. Patient denies fevers. Patient advised by this RN to be seen within 72hrs per protocol to which patient was agreeable. Patient advised by this RN to call back with worsening symptoms. Patient verbalized understanding.   Copied from CRM [id number]. Topic: Clinical - Red Word Triage >> May 25, 2023 10:22 AM Denese Killings wrote: Red Word that prompted transfer to Nurse Triage: Patient has severe pain in head and dark colored mucus. She feels that she has a sinus infection and symptoms has going on for 3 days. Reason for Disposition  [1] Using nasal washes and pain medicine > 24 hours AND [2] sinus pain (around cheekbone or eye) persists  Answer Assessment - Initial Assessment Questions 1. LOCATION: "Where does it hurt?"      "Everywhere from the middle of my head up" 2. ONSET: "When did the sinus pain start?"  (e.g., hours, days)      3 day 3. SEVERITY: "How bad is the pain?"   (Scale 1-10; mild, moderate or severe)   - MILD (1-3): doesn't interfere with normal activities    - MODERATE (4-7): interferes with normal activities (e.g., work or school) or awakens from sleep   - SEVERE (8-10): excruciating pain and patient  unable to do any normal activities        Severe "Sometimes its a 9 and sometimes its a 7 but it hurts unlike anything I've ever had. 4. RECURRENT SYMPTOM: "Have you ever had sinus problems before?" If Yes, ask: "When was the last time?" and "What happened that time?"      Denies 5. NASAL CONGESTION: "Is the nose blocked?" If Yes, ask: "Can you open it or must you breathe through your mouth?"     Yes, "I've been using my pot to flush my nose out but when I don't do that it's congested." 6. NASAL DISCHARGE: "Do you have discharge from your nose?" If so ask, "What color?"    Dark green 7. FEVER: "Do you have a fever?" If Yes, ask: "What is it, how was it measured, and when did it start?"      Denies 8. OTHER SYMPTOMS: "Do you have any other symptoms?" (e.g., sore throat, cough, earache, difficulty breathing)     Cough, headache.  Protocols used: Sinus Pain or Congestion-A-AH

## 2023-05-25 NOTE — Progress Notes (Signed)
Assessment & Plan:  1. Acute non-recurrent pansinusitis (Primary) Education provided on sinus infections. Encouraged symptom management including Tylenol, Flonase, Vicks, and a humidifier at night.   - amoxicillin-clavulanate (AUGMENTIN) 875-125 MG tablet; Take 1 tablet by mouth 2 (two) times daily for 7 days.  Dispense: 14 tablet; Refill: 0   Follow up plan: Return if symptoms worsen or fail to improve.  Deliah Boston, MSN, APRN, FNP-C  Subjective:  HPI: Courtney [name] is a 73 y.o. female presenting on 05/25/2023 for Sinusitis (Nasal drainage, dark green congestion, HA, Congestion, sinus pressure - all X 3 days )  Patient complains of head congestion, headache, sore throat, facial pain/pressure, and postnasal drainage. She denies shortness of breath, wheezing, nausea, vomiting, and diarrhea. Onset of symptoms was 3 days ago, unchanged since that time. She is drinking plenty of fluids. Evaluation to date: at home COVID test was negative. Treatment to date:  sinus rinses . She does not smoke.    ROS: Negative unless specifically indicated above in HPI.   Relevant past medical history reviewed and updated as indicated.   Allergies and medications reviewed and updated.   Current Outpatient Medications:    albuterol (VENTOLIN HFA) 108 (90 Base) MCG/ACT inhaler, Inhale 2 puffs into the lungs every 4 (four) hours as needed for wheezing or shortness of breath., Disp: 18 g, Rfl: 3   amphetamine-dextroamphetamine (ADDERALL XR) 15 MG 24 hr capsule, Take 1 capsule by mouth every morning., Disp: 30 capsule, Rfl: 0   busPIRone (BUSPAR) 15 MG tablet, Take 1/3 tablet p.o. twice daily for 1 week, then take 2/3 tablet p.o. twice daily for 1 week, then take 1 tablet p.o. twice daily, Disp: 60 tablet, Rfl: 1   cyclobenzaprine (FLEXERIL) 10 MG tablet, Take 10 mg by mouth as needed., Disp: , Rfl:    fluticasone (FLONASE) 50 MCG/ACT nasal spray, Place into both nostrils daily., Disp: , Rfl:     hydroxychloroquine (PLAQUENIL) 200 MG tablet, Take 1 tablet by mouth 2 (two) times daily., Disp: , Rfl:    imipramine (TOFRANIL) 50 MG tablet, Take 1 tablet (50 mg total) by mouth at bedtime., Disp: 90 tablet, Rfl: 1   levothyroxine (SYNTHROID) 50 MCG tablet, Take 50 mcg by mouth daily before breakfast., Disp: , Rfl:    lithium carbonate (ESKALITH) 450 MG ER tablet, Take 1 tablet (450 mg total) by mouth at bedtime., Disp: 90 tablet, Rfl: 0   LORazepam (ATIVAN) 1 MG tablet, Take 1 tablet (1 mg total) by mouth every 8 (eight) hours. (Patient taking differently: Take 1 mg by mouth as needed.), Disp: 90 tablet, Rfl: 1   omega-3 acid ethyl esters (LOVAZA) 1 g capsule, Take 2 capsules (2 g total) by mouth 2 (two) times daily., Disp: 120 capsule, Rfl: 11   ondansetron (ZOFRAN-ODT) 4 MG disintegrating tablet, Take 1 tablet (4 mg total) by mouth every 8 (eight) hours as needed., Disp: 20 tablet, Rfl: 0   pantoprazole (PROTONIX) 40 MG tablet, Take 1 tablet (40 mg total) by mouth daily., Disp: 30 tablet, Rfl: 5   Testosterone 1.62 % GEL, Place 5 mg onto the skin daily., Disp: 30 g, Rfl: 3   triamcinolone cream (KENALOG) 0.5 %, Apply 1 Application topically 3 (three) times daily., Disp: 30 g, Rfl: 1   vitamin E 400 UNIT capsule, Take 800 Units by mouth daily. , Disp: , Rfl:    [START ON 06/04/2023] amphetamine-dextroamphetamine (ADDERALL XR) 15 MG 24 hr capsule, Take 1 capsule by mouth every morning. (Patient  not taking: Reported on 05/25/2023), Disp: 30 capsule, Rfl: 0   nitroGLYCERIN (NITROSTAT) 0.4 MG SL tablet, Place 1 tablet (0.4 mg total) under the tongue every 5 (five) minutes as needed for chest pain. (Patient not taking: Reported on 05/25/2023), Disp: 25 tablet, Rfl: 2   pravastatin (PRAVACHOL) 20 MG tablet, Take one tablet by mouth 4 days per week (Patient not taking: Reported on 05/25/2023), Disp: 48 tablet, Rfl: 3   traMADol (ULTRAM) 50 MG tablet, TAKE 1/2 TO 1 TABLET(25 TO 50 MG) BY MOUTH BACK EVERY 8  HOURS AS NEEDED FOR SEVERE PAIN (Patient not taking: Reported on 05/25/2023), Disp: 90 tablet, Rfl: 0  Allergies  Allergen Reactions   Clarithromycin Nausea Only   Covid-19 (Mrna) Vaccine Other (See Comments)    Acute Vasculitis; excessive bleeding  Other reaction(s): Other (See Comments) Acute Vasculitis; excessive bleeding    Other Other (See Comments)   Bactrim [Sulfamethoxazole-Trimethoprim] Nausea And Vomiting   Morphine Nausea And Vomiting    Other reaction(s): Nausea/Vomiting   Morphine And Codeine Nausea And Vomiting   Azithromycin Nausea And Vomiting and Nausea Only   Ceclor [Cefaclor] Nausea And Vomiting    Can take Augmentin ok   Claritin [Loratadine]     REACTION: bruises   Codeine Nausea And Vomiting    Can take Tramadol ok   Cymbalta [Duloxetine Hcl]    Erythromycin Nausea And Vomiting    Other reaction(s): Nausea/Vomiting   Erythromycin Base Nausea And Vomiting   Levofloxacin Nausea Only    REACTION: nausea   Lipitor [Atorvastatin]     Other reaction(s): Myalgias (intolerance)   Nitrofurantoin Nausea Only   Oxycodone Other (See Comments)   Penicillins Nausea And Vomiting    Can take Augmentin   Propoxyphene Nausea Only    dizzy   Propoxyphene N-Acetaminophen Nausea Only    dizzy    Objective:   BP 102/78   Pulse 92   Temp 98.5 F (36.9 C)   Resp 20   Ht 4\' 11"  (1.499 m)   Wt 118 lb (53.5 kg)   LMP 01/08/1999   BMI 23.83 kg/m    Physical Exam Vitals reviewed.  Constitutional:      General: She is not in acute distress.    Appearance: Normal appearance. She is not ill-appearing, toxic-appearing or diaphoretic.  HENT:     Head: Normocephalic and atraumatic.     Right Ear: Tympanic membrane, ear canal and external ear normal. There is no impacted cerumen.     Left Ear: Tympanic membrane, ear canal and external ear normal. There is no impacted cerumen.     Nose: No congestion or rhinorrhea.     Right Sinus: Maxillary sinus tenderness and frontal  sinus tenderness present.     Left Sinus: Maxillary sinus tenderness and frontal sinus tenderness present.     Mouth/Throat:     Mouth: Mucous membranes are moist.     Pharynx: Oropharynx is clear. No oropharyngeal exudate or posterior oropharyngeal erythema.  Eyes:     General: No scleral icterus.       Right eye: No discharge.        Left eye: No discharge.     Conjunctiva/sclera: Conjunctivae normal.  Cardiovascular:     Rate and Rhythm: Normal rate and regular rhythm.     Heart sounds: Normal heart sounds. No murmur heard.    No friction rub. No gallop.  Pulmonary:     Effort: Pulmonary effort is normal. No respiratory distress.  Breath sounds: Normal breath sounds. No stridor. No wheezing, rhonchi or rales.  Musculoskeletal:        General: Normal range of motion.     Cervical back: Normal range of motion.  Lymphadenopathy:     Cervical: No cervical adenopathy.  Skin:    General: Skin is warm and dry.     Capillary Refill: Capillary refill takes less than 2 seconds.  Neurological:     General: No focal deficit present.     Mental Status: She is alert and oriented to person, place, and time. Mental status is at baseline.  Psychiatric:        Mood and Affect: Mood normal.        Behavior: Behavior normal.        Thought Content: Thought content normal.        Judgment: Judgment normal.

## 2023-05-26 DIAGNOSIS — F411 Generalized anxiety disorder: Secondary | ICD-10-CM | POA: Diagnosis not present

## 2023-06-04 DIAGNOSIS — F411 Generalized anxiety disorder: Secondary | ICD-10-CM | POA: Diagnosis not present

## 2023-06-08 ENCOUNTER — Ambulatory Visit (INDEPENDENT_AMBULATORY_CARE_PROVIDER_SITE_OTHER): Payer: Medicare Other

## 2023-06-08 VITALS — Ht 64.0 in | Wt 118.0 lb

## 2023-06-08 DIAGNOSIS — Z780 Asymptomatic menopausal state: Secondary | ICD-10-CM | POA: Diagnosis not present

## 2023-06-08 DIAGNOSIS — Z1231 Encounter for screening mammogram for malignant neoplasm of breast: Secondary | ICD-10-CM

## 2023-06-08 DIAGNOSIS — Z0000 Encounter for general adult medical examination without abnormal findings: Secondary | ICD-10-CM | POA: Diagnosis not present

## 2023-06-08 DIAGNOSIS — M8580 Other specified disorders of bone density and structure, unspecified site: Secondary | ICD-10-CM

## 2023-06-08 NOTE — Patient Instructions (Addendum)
 Courtney [name] , Thank you for taking time to come for your Medicare Wellness Visit. I appreciate your ongoing commitment to your health goals. Please review the following plan we discussed and let me know if I can assist you in the future.   Referrals/Orders/Follow-Ups/Clinician Recommendations: Repeat Mammogram and DEXA scan in 2025. Aim for 30 minutes of exercise or brisk walking, 6-8 glasses of water , and 5 servings of fruits and vegetables each day. Patient plans to get Hepatitis C vaccines at local pharmacy.  This is a list of the screening recommended for you and due dates:  Health Maintenance  Topic Date Due   Hepatitis C Screening  Never done   Mammogram  05/22/2023   Medicare Annual Wellness Visit  06/07/2024   DTaP/Tdap/Td vaccine (3 - Td or Tdap) 07/05/2030   Colon Cancer Screening  04/13/2031   Pneumonia Vaccine  Completed   DEXA scan (bone density measurement)  Completed   Zoster (Shingles) Vaccine  Completed   HPV Vaccine  Aged Out   Flu Shot  Discontinued   COVID-19 Vaccine  Discontinued    Advanced directives: (In Chart) A copy of your advanced directives are scanned into your chart should your provider ever need it.  Next Medicare Annual Wellness Visit scheduled for next year: Yes - 2/11/2026Managing Pain Without Opioids Opioids are strong medicines used to treat moderate to severe pain. For some people, especially those who have long-term (chronic) pain, opioids may not be the best choice for pain management due to: Side effects like nausea, constipation, and sleepiness. The risk of addiction (opioid use disorder). The longer you take opioids, the greater your risk of addiction. Pain that lasts for more than 3 months is called chronic pain. Managing chronic pain usually requires more than one approach and is often provided by a team of health care providers working together (multidisciplinary approach). Pain management may be done at a pain management center or pain  clinic. How to manage pain without the use of opioids Use non-opioid medicines Non-opioid medicines for pain may include: Over-the-counter or prescription non-steroidal anti-inflammatory drugs (NSAIDs). These may be the first medicines used for pain. They work well for muscle and bone pain, and they reduce swelling. Acetaminophen . This over-the-counter medicine may work well for milder pain but not swelling. Antidepressants. These may be used to treat chronic pain. A certain type of antidepressant (tricyclics) is often used. These medicines are given in lower doses for pain than when used for depression. Anticonvulsants. These are usually used to treat seizures but may also reduce nerve (neuropathic) pain. Muscle relaxants. These relieve pain caused by sudden muscle tightening (spasms). You may also use a pain medicine that is applied to the skin as a patch, cream, or gel (topical analgesic), such as a numbing medicine. These may cause fewer side effects than medicines taken by mouth. Do certain therapies as directed Some therapies can help with pain management. They include: Physical therapy. You will do exercises to gain strength and flexibility. A physical therapist may teach you exercises to move and stretch parts of your body that are weak, stiff, or painful. You can learn these exercises at physical therapy visits and practice them at home. Physical therapy may also involve: Massage. Heat wraps or applying heat or cold to affected areas. Electrical signals that interrupt pain signals (transcutaneous electrical nerve stimulation, TENS). Weak lasers that reduce pain and swelling (low-level laser therapy). Signals from your body that help you learn to regulate pain (biofeedback). Occupational therapy. This  helps you to learn ways to function at home and work with less pain. Recreational therapy. This involves trying new activities or hobbies, such as a physical activity or drawing. Mental  health therapy, including: Cognitive behavioral therapy (CBT). This helps you learn coping skills for dealing with pain. Acceptance and commitment therapy (ACT) to change the way you think and react to pain. Relaxation therapies, including muscle relaxation exercises and mindfulness-based stress reduction. Pain management counseling. This may be individual, family, or group counseling.  Receive medical treatments Medical treatments for pain management include: Nerve block injections. These may include a pain blocker and anti-inflammatory medicines. You may have injections: Near the spine to relieve chronic back or neck pain. Into joints to relieve back or joint pain. Into nerve areas that supply a painful area to relieve body pain. Into muscles (trigger point injections) to relieve some painful muscle conditions. A medical device placed near your spine to help block pain signals and relieve nerve pain or chronic back pain (spinal cord stimulation device). Acupuncture. Follow these instructions at home Medicines Take over-the-counter and prescription medicines only as told by your health care provider. If you are taking pain medicine, ask your health care providers about possible side effects to watch out for. Do not drive or use heavy machinery while taking prescription opioid pain medicine. Lifestyle  Do not use drugs or alcohol to reduce pain. If you drink alcohol, limit how much you have to: 0-1 drink a day for women who are not pregnant. 0-2 drinks a day for men. Know how much alcohol is in a drink. In the [place] one drink equals one 12 oz bottle of beer (355 mL), one 5 oz glass of wine (148 mL), or one 1 oz glass of hard liquor (44 mL). Do not use any products that contain nicotine or tobacco. These products include cigarettes, chewing tobacco, and vaping devices, such as e-cigarettes. If you need help quitting, ask your health care provider. Eat a healthy diet and maintain a healthy  weight. Poor diet and excess weight may make pain worse. Eat foods that are high in fiber. These include fresh fruits and vegetables, whole grains, and beans. Limit foods that are high in fat and processed sugars, such as fried and sweet foods. Exercise regularly. Exercise lowers stress and may help relieve pain. Ask your health care provider what activities and exercises are safe for you. If your health care provider approves, join an exercise class that combines movement and stress reduction. Examples include yoga and tai chi. Get enough sleep. Lack of sleep may make pain worse. Lower stress as much as possible. Practice stress reduction techniques as told by your therapist. General instructions Work with all your pain management providers to find the treatments that work best for you. You are an important member of your pain management team. There are many things you can do to reduce pain on your own. Consider joining an online or in-person support group for people who have chronic pain. Keep all follow-up visits. This is important. Where to find more information You can find more information about managing pain without opioids from: American Academy of Pain Medicine: painmed.org Institute for Chronic Pain: instituteforchronicpain.org American Chronic Pain Association: theacpa.org Contact a health care provider if: You have side effects from pain medicine. Your pain gets worse or does not get better with treatments or home therapy. You are struggling with anxiety or depression. Summary Many types of pain can be managed without opioids. Chronic pain may  respond better to pain management without opioids. Pain is best managed when you and a team of health care providers work together. Pain management without opioids may include non-opioid medicines, medical treatments, physical therapy, mental health therapy, and lifestyle changes. Tell your health care providers if your pain gets worse or  is not being managed well enough. This information is not intended to replace advice given to you by your health care provider. Make sure you discuss any questions you have with your health care provider. Document Revised: 07/25/2020 Document Reviewed: 07/25/2020 [employer]vier Patient Education  2024 [employer].

## 2023-06-08 NOTE — Progress Notes (Addendum)
 Subjective:   Courtney [name] is a 73 y.o. female who presents for Medicare Annual (Subsequent) preventive examination.  Visit Complete: Virtual I connected with  Courtney [name] on 06/08/23 by a audio enabled telemedicine application and verified that I am speaking with the correct person using two identifiers.  Patient Location: Home  Provider Location: Office/Clinic  I discussed the limitations of evaluation and management by telemedicine. The patient expressed understanding and agreed to proceed.  Vital Signs: Because this visit was a virtual/telehealth visit, some criteria may be missing or patient reported. Any vitals not documented were not able to be obtained and vitals that have been documented are patient reported.  Patient Medicare AWV questionnaire was completed by the patient on  05/25/23 (partial); I have confirmed that all information answered by patient is correct and no changes since this date.  Cardiac Risk Factors include: advanced age (>15men, >26 women)     Objective:    Today's Vitals   06/08/23 0812  Weight: 118 lb (53.5 kg)  Height: 5\' 4"  (1.626 m)   Body mass index is 20.25 kg/m.     06/08/2023    8:05 AM 07/20/2022   11:05 AM 02/27/2022    8:53 AM 04/17/2021    6:03 PM 04/17/2021    6:02 PM 04/15/2021    4:37 PM 02/25/2021    4:03 PM  Advanced Directives  Does Patient Have a Medical Advance Directive? Yes No Yes  Yes Yes Yes  Type of Estate agent of [place];Living will  Healthcare Power of [place];Living will  Healthcare Power of [place];Living will Healthcare Power of [place];Living will Healthcare Power of [place];Living will  Does patient want to make changes to medical advance directive? No - Patient declined  No - Patient declined  No - Patient declined    Copy of Healthcare Power of Attorney in Chart? Yes - validated most recent copy scanned in chart (See row information)  Yes - validated most recent copy  scanned in chart (See row information)  No - copy requested    Would patient like information on creating a medical advance directive?    No - Patient declined No - Patient declined No - Patient declined     Current Medications (verified) Outpatient Encounter Medications as of 06/08/2023  Medication Sig   albuterol (VENTOLIN HFA) 108 (90 Base) MCG/ACT inhaler Inhale 2 puffs into the lungs every 4 (four) hours as needed for wheezing or shortness of breath.   amphetamine-dextroamphetamine (ADDERALL XR) 15 MG 24 hr capsule Take 1 capsule by mouth every morning.   busPIRone (BUSPAR) 15 MG tablet Take 1/3 tablet p.o. twice daily for 1 week, then take 2/3 tablet p.o. twice daily for 1 week, then take 1 tablet p.o. twice daily   cyclobenzaprine (FLEXERIL) 10 MG tablet Take 10 mg by mouth as needed.   fluticasone (FLONASE) 50 MCG/ACT nasal spray Place into both nostrils daily.   hydroxychloroquine (PLAQUENIL) 200 MG tablet Take 1 tablet by mouth 2 (two) times daily.   imipramine (TOFRANIL) 50 MG tablet Take 1 tablet (50 mg total) by mouth at bedtime.   levothyroxine (SYNTHROID) 50 MCG tablet Take 50 mcg by mouth daily before breakfast.   lithium carbonate (ESKALITH) 450 MG ER tablet Take 1 tablet (450 mg total) by mouth at bedtime.   LORazepam (ATIVAN) 1 MG tablet Take 1 tablet (1 mg total) by mouth every 8 (eight) hours. (Patient taking differently: Take 1 mg by mouth as needed.)   nitroGLYCERIN (NITROSTAT)  0.4 MG SL tablet Place 1 tablet (0.4 mg total) under the tongue every 5 (five) minutes as needed for chest pain. (Patient not taking: Reported on 05/25/2023)   omega-3 acid ethyl esters (LOVAZA) 1 g capsule Take 2 capsules (2 g total) by mouth 2 (two) times daily.   ondansetron (ZOFRAN-ODT) 4 MG disintegrating tablet Take 1 tablet (4 mg total) by mouth every 8 (eight) hours as needed.   pravastatin (PRAVACHOL) 20 MG tablet Take one tablet by mouth 4 days per week (Patient not taking: Reported on  05/25/2023)   Testosterone 1.62 % GEL Place 5 mg onto the skin daily.   traMADol (ULTRAM) 50 MG tablet TAKE 1/2 TO 1 TABLET(25 TO 50 MG) BY MOUTH BACK EVERY 8 HOURS AS NEEDED FOR SEVERE PAIN (Patient not taking: Reported on 05/25/2023)   triamcinolone cream (KENALOG) 0.5 % Apply 1 Application topically 3 (three) times daily.   vitamin E 400 UNIT capsule Take 800 Units by mouth daily.    [DISCONTINUED] amphetamine-dextroamphetamine (ADDERALL XR) 15 MG 24 hr capsule Take 1 capsule by mouth every morning. (Patient not taking: Reported on 05/25/2023)   [DISCONTINUED] pantoprazole (PROTONIX) 40 MG tablet Take 1 tablet (40 mg total) by mouth daily.   No facility-administered encounter medications on file as of 06/08/2023.    Allergies (verified) Clarithromycin, Covid-19 (mrna) vaccine, Other, Bactrim [sulfamethoxazole-trimethoprim], Morphine, Morphine and codeine, Azithromycin, Ceclor [cefaclor], Claritin [loratadine], Codeine, Cymbalta [duloxetine hcl], Erythromycin, Erythromycin base, Levofloxacin, Lipitor [atorvastatin], Nitrofurantoin, Oxycodone, Penicillins, Propoxyphene, and Propoxyphene n-acetaminophen   History: Past Medical History:  Diagnosis Date   ADD (attention deficit disorder)    Anxiety    Celiac disease    possible vs IBS   Complication of anesthesia    Coronary artery disease    Depression    no bipolar per Dr. Nolen Mu   GI problem    Juanda Chance   Gluten intolerance    Hyperlipidemia    IBS (irritable bowel syndrome)    Osteoarthritis    Osteoporosis    Pneumonia    PONV (postoperative nausea and vomiting)    Rheumatoid arthritis (HCC)    Seasonal allergies 01/08/2012   hx. of multiple bronchitis related to this.   Shoulder pain, right    Past Surgical History:  Procedure Laterality Date   EXPLORATORY LAPAROTOMY  01/08/2012   due to endometriosis-portions of both ovaries removed   ROTATOR CUFF REPAIR Right    TONSILLECTOMY     TOTAL HIP ARTHROPLASTY Right    right.  2002   TOTAL HIP ARTHROPLASTY  01/14/2012   Procedure: TOTAL HIP ARTHROPLASTY;  Surgeon: Loanne Drilling, MD;  Location: [facility];  Service: Orthopedics;  Laterality: Left;   TOTAL KNEE ARTHROPLASTY Left 07/16/2020   Procedure: TOTAL KNEE ARTHROPLASTY;  Surgeon: Ollen Gross, MD;  Location: [facility];  Service: Orthopedics;  Laterality: Left;    VENTRAL HERNIA REPAIR  01/08/2012   abdominal   Family History  Problem Relation Age of Onset   Heart disease Mother    Heart attack Mother    Prostate cancer Father    Heart disease Sister    Heart disease Maternal Grandmother    Breast cancer Paternal Grandmother    Colon cancer Paternal Grandfather    Coronary artery disease Other        FH Female 1st degree relative <60   ADD / ADHD Other    Other Daughter        gluten intolerance   Social History   Socioeconomic  History   Marital status: Married    Spouse name: Not on file   Number of children: 2   Years of education: Not on file   Highest education level: Not on file  Occupational History   Occupation: Network engineer  Tobacco Use   Smoking status: Never   Smokeless tobacco: Never   Tobacco comments:    only Financial trader   Vaping status: Never Used  Substance and Sexual Activity   Alcohol use: Yes    Comment: 0-1 per day   Drug use: No   Sexual activity: Yes  Other Topics Concern   Not on file  Social History Narrative   Married for last 42 years.Lives with husband.Retired Human resources officer.Originally from [place].   Social Drivers of Corporate investment banker Strain: Low Risk  (06/08/2023)   Overall Financial Resource Strain (CARDIA)    Difficulty of Paying Living Expenses: Not hard at all  Food Insecurity: No Food Insecurity (06/08/2023)   Hunger Vital Sign    Worried About Running Out of Food in the Last Year: Never true    Ran Out of Food in the Last Year: Never true  Transportation Needs: No Transportation Needs (06/08/2023)    PRAPARE - Administrator, Civil Service (Medical): No    Lack of Transportation (Non-Medical): No  Physical Activity: Sufficiently Active (06/08/2023)   Exercise Vital Sign    Days of Exercise per Week: 4 days    Minutes of Exercise per Session: 120 min  Stress: No Stress Concern Present (06/08/2023)   [employer] of Occupational Health - Occupational Stress Questionnaire    Feeling of Stress : Not at all  Social Connections: Socially Integrated (06/08/2023)   Social Connection and Isolation Panel [NHANES]    Frequency of Communication with Friends and Family: More than three times a week    Frequency of Social Gatherings with Friends and Family: More than three times a week    Attends Religious Services: More than 4 times per year    Active Member of [employer] or Organizations: Yes    Attends Engineer, structural: More than 4 times per year    Marital Status: Married    Tobacco Counseling Counseling given: Not Answered Tobacco comments: only social   Clinical Intake:  Pre-visit preparation completed: Yes  Pain : No/denies pain     BMI - recorded: 20.25 Diabetes: No  How often do you need to have someone help you when you read instructions, pamphlets, or other written materials from your doctor or pharmacy?: 1 - Never  Interpreter Needed?: No  Information entered by :: Hassell Halim, CMA   Activities of Daily Living    06/08/2023    8:16 AM  In your present state of health, do you have any difficulty performing the following activities:  Hearing? 0  Vision? 0  Difficulty concentrating or making decisions? 0  Walking or climbing stairs? 0  Dressing or bathing? 0  Doing errands, shopping? 0  Preparing Food and eating ? N  Using the Toilet? N  In the past six months, have you accidently leaked urine? N  Do you have problems with loss of bowel control? N  Managing your Medications? N  Managing your Finances? N  Housekeeping or managing your  Housekeeping? N    Patient Care Team: Plotnikov, Georgina Quint, MD as PCP - General Katrinka Blazing Barry Dienes, MD (Inactive) as PCP - Cardiology (Cardiology) Deatra Robinson, NP (Psychiatry) Ollen Gross,  MD as Consulting Physician (Orthopedic Surgery) Cottle, Steva Ready., MD as Attending Physician (Psychiatry) Zenovia Jordan, MD as Consulting Physician (Rheumatology) Blima Ledger, OD as Consulting Physician (Optometry) Truitt Merle, MD as Referring Physician (Gastroenterology)  Indicate any recent Medical Services you may have received from other than [facility] providers in the past year (date may be approximate).     Assessment:   This is a routine wellness examination for Sherlie.  Hearing/Vision screen Hearing Screening - Comments:: Denies hearing difficulties   Vision Screening - Comments:: Wears reading eyeglasses - sees Dr Blima Ledger   Goals Addressed               This Visit's Progress     Patient Stated (pt-stated)        Patient stated she plans to stay active and wants to build more strength in muscles (legs).       Depression Screen    06/08/2023    8:23 AM 05/25/2023    3:10 PM 07/29/2022    9:03 AM 06/11/2022    4:33 PM 02/27/2022    8:53 AM 08/20/2021    8:34 AM 07/31/2021    9:50 AM  PHQ 2/9 Scores  PHQ - 2 Score 0 0 0 0 0 0 1  PHQ- 9 Score   0 0  0     Fall Risk    06/08/2023    8:17 AM 05/25/2023    3:10 PM 07/29/2022    9:02 AM 06/11/2022    4:33 PM 02/27/2022    8:53 AM  Fall Risk   Falls in the past year? 0 0 0 0 0  Number falls in past yr: 0 0 0 0 0  Injury with Fall? 0 0 0 0 0  Risk for fall due to : No Fall Risks No Fall Risks No Fall Risks No Fall Risks No Fall Risks  Follow up Falls prevention discussed;Falls evaluation completed Falls evaluation completed Falls evaluation completed Falls evaluation completed Falls prevention discussed    MEDICARE RISK AT HOME: Medicare Risk at Home Any stairs in or around the home?: Yes If so, are there any without  handrails?: No Home free of loose throw rugs in walkways, pet beds, electrical cords, etc?: Yes Adequate lighting in your home to reduce risk of falls?: Yes Life alert?: No Use of a cane, walker or w/c?: No Grab bars in the bathroom?: No Shower chair or bench in shower?: No Elevated toilet seat or a handicapped toilet?: No  TIMED UP AND GO:  Was the test performed?  No    Cognitive Function:        06/08/2023    8:19 AM 02/27/2022    8:53 AM  6CIT Screen  What Year? 0 points 0 points  What month? 0 points 0 points  What time? 0 points 0 points  Count back from 20 0 points 0 points  Months in reverse 0 points 0 points  Repeat phrase 0 points 0 points  Total Score 0 points 0 points    Immunizations Immunization History  Administered Date(s) Administered   Fluad Quad(high Dose 65+) 05/08/2022   Hep A / Hep B 03/07/2011, 04/04/2011, 10/02/2011   Influenza Inj Mdck Quad Pf 02/23/2017   Influenza Inj Mdck Quad With Preservative 02/10/2018   Influenza Split 03/24/2011, 04/06/2012   Influenza, High Dose Seasonal PF 04/20/2015, 04/16/2016, 02/01/2019   MMR 04/15/2011   Meningococcal polysaccharide vaccine (MPSV4) 04/04/2011   Moderna Sars-Covid-2 Vaccination 05/13/2019, 06/13/2019  Pneumococcal Conjugate-13 04/16/2016   Pneumococcal Polysaccharide-23 03/24/2011, 04/05/2018   Tdap 03/07/2011, 07/04/2020   Zoster Recombinant(Shingrix) 01/06/2017, 03/10/2017    TDAP status: Up to date - 07/04/20  Flu Vaccine status: Up to date - 05/08/22  Pneumococcal vaccine status: Up to date - 04/05/18  Covid-19 vaccine status: Declined, Education has been provided regarding the importance of this vaccine but patient still declined. Advised may receive this vaccine at local pharmacy or Health Dept.or vaccine clinic. Aware to provide a copy of the vaccination record if obtained from local pharmacy or Health Dept. Verbalized acceptance and understanding.  Qualifies for Shingles Vaccine? Yes    Zostavax completed Yes   Shingrix Completed?: Yes  Screening Tests Health Maintenance  Topic Date Due   Hepatitis C Screening  Never done   Medicare Annual Wellness (AWV)  02/28/2023   MAMMOGRAM  05/22/2023   DTaP/Tdap/Td (3 - Td or Tdap) 07/05/2030   Colonoscopy  04/13/2031   Pneumonia Vaccine 71+ Years old  Completed   DEXA SCAN  Completed   Zoster Vaccines- Shingrix  Completed   HPV VACCINES  Aged Out   INFLUENZA VACCINE  Discontinued   COVID-19 Vaccine  Discontinued    Health Maintenance  Health Maintenance Due  Topic Date Due   Hepatitis C Screening  Never done   Medicare Annual Wellness (AWV)  02/28/2023   MAMMOGRAM  05/22/2023    Colorectal cancer screening: Type of screening: Colonoscopy. Completed 04/12/21. Repeat every 10 years  Mammogram status: Ordered 06/08/23. Pt provided with contact info and advised to call to schedule appt.   Bone Density status: Completed 05/21/21. Results reflect: Bone density results: OSTEOPENIA. Repeat every 2 years.  Additional Screening:  Hepatitis C Screening: does qualify; Plans to get at local pharmacy  Vision Screening: Recommended annual ophthalmology exams for early detection of glaucoma and other disorders of the eye. Is the patient up to date with their annual eye exam?  Yes  Who is the provider or what is the name of the office in which the patient attends annual eye exams? Dr Blima Ledger If pt is not established with a provider, would they like to be referred to a provider to establish care? No .   Dental Screening: Recommended annual dental exams for proper oral hygiene   Community Resource Referral / Chronic Care Management: CRR required this visit?  No   CCM required this visit?  No     Plan:     I have personally reviewed and noted the following in the patient's chart:   Medical and social history Use of alcohol, tobacco or illicit drugs  Current medications and supplements including opioid  prescriptions. Patient is currently taking opioid prescriptions. Information provided to patient regarding non-opioid alternatives. Patient advised to discuss non-opioid treatment plan with their provider. Functional ability and status Nutritional status Physical activity Advanced directives List of other physicians Hospitalizations, surgeries, and ER visits in previous 12 months Vitals Screenings to include cognitive, depression, and falls Referrals and appointments  In addition, I have reviewed and discussed with patient certain preventive protocols, quality metrics, and best practice recommendations. A written personalized care plan for preventive services as well as general preventive health recommendations were provided to patient.     Darreld Mclean, CMA   06/08/2023   After Visit Summary: (MyChart) Due to this being a telephonic visit, the after visit summary with patients personalized plan was offered to patient via MyChart   Nurse Notes: referral for DEXA scan and Mammogram  placed today.    Medical screening examination/treatment/procedure(s) were performed by non-physician practitioner and as supervising physician I was immediately available for consultation/collaboration.  I agree with above. Jacinta Shoe, MD

## 2023-06-11 DIAGNOSIS — F411 Generalized anxiety disorder: Secondary | ICD-10-CM | POA: Diagnosis not present

## 2023-06-18 DIAGNOSIS — F411 Generalized anxiety disorder: Secondary | ICD-10-CM | POA: Diagnosis not present

## 2023-06-25 DIAGNOSIS — F411 Generalized anxiety disorder: Secondary | ICD-10-CM | POA: Diagnosis not present

## 2023-06-26 ENCOUNTER — Telehealth: Payer: Self-pay | Admitting: Psychiatry

## 2023-06-26 NOTE — Telephone Encounter (Signed)
 Pt LVM @ 1:21p asking if she is supposed to have bloodwork done before her next visit.  She thinks she's supposed to have Lithium level and basic metabolic panel, but she wants someone to call her back to confirm.  Next appt 3/12

## 2023-06-28 NOTE — Telephone Encounter (Signed)
 Labs sent to [facility] for lithium and BMP in Nov.

## 2023-06-29 DIAGNOSIS — E039 Hypothyroidism, unspecified: Secondary | ICD-10-CM | POA: Diagnosis not present

## 2023-06-29 DIAGNOSIS — Z79899 Other long term (current) drug therapy: Secondary | ICD-10-CM | POA: Diagnosis not present

## 2023-06-29 DIAGNOSIS — F331 Major depressive disorder, recurrent, moderate: Secondary | ICD-10-CM | POA: Diagnosis not present

## 2023-06-29 DIAGNOSIS — R7989 Other specified abnormal findings of blood chemistry: Secondary | ICD-10-CM | POA: Diagnosis not present

## 2023-06-29 DIAGNOSIS — M832 Adult osteomalacia due to malabsorption: Secondary | ICD-10-CM | POA: Diagnosis not present

## 2023-06-29 DIAGNOSIS — M8580 Other specified disorders of bone density and structure, unspecified site: Secondary | ICD-10-CM | POA: Diagnosis not present

## 2023-06-29 NOTE — Telephone Encounter (Signed)
 Patient notified of labs. Told her she needed to take as close to 12 hours after last lithium dose as possible.

## 2023-06-30 LAB — BASIC METABOLIC PANEL
BUN: 21 mg/dL (ref 7–25)
CO2: 23 mmol/L (ref 20–32)
Calcium: 9.5 mg/dL (ref 8.6–10.4)
Chloride: 104 mmol/L (ref 98–110)
Creat: 0.88 mg/dL (ref 0.60–1.00)
Glucose, Bld: 92 mg/dL (ref 65–139)
Potassium: 4.7 mmol/L (ref 3.5–5.3)
Sodium: 135 mmol/L (ref 135–146)

## 2023-06-30 LAB — LITHIUM LEVEL: Lithium Lvl: 0.6 mmol/L (ref 0.6–1.2)

## 2023-07-01 ENCOUNTER — Telehealth: Payer: Self-pay | Admitting: Psychiatry

## 2023-07-01 ENCOUNTER — Other Ambulatory Visit: Payer: Self-pay

## 2023-07-01 DIAGNOSIS — F902 Attention-deficit hyperactivity disorder, combined type: Secondary | ICD-10-CM

## 2023-07-01 MED ORDER — AMPHETAMINE-DEXTROAMPHET ER 15 MG PO CP24: 15.0000 mg | ORAL_CAPSULE | ORAL | 0 refills | Status: DC

## 2023-07-01 NOTE — Telephone Encounter (Signed)
 Pended Adderall XR 15 to [facility]

## 2023-07-01 NOTE — Telephone Encounter (Signed)
 Courtney [name] called at 10:30 to request refill of her Adderall.  Appt 3/12.  Send to   [id number] - [place], [place] - [address]

## 2023-07-08 ENCOUNTER — Encounter: Payer: Self-pay | Admitting: Psychiatry

## 2023-07-08 ENCOUNTER — Ambulatory Visit: Payer: Medicare Other | Admitting: Psychiatry

## 2023-07-08 DIAGNOSIS — F331 Major depressive disorder, recurrent, moderate: Secondary | ICD-10-CM | POA: Diagnosis not present

## 2023-07-08 DIAGNOSIS — Z79899 Other long term (current) drug therapy: Secondary | ICD-10-CM

## 2023-07-08 DIAGNOSIS — F411 Generalized anxiety disorder: Secondary | ICD-10-CM | POA: Diagnosis not present

## 2023-07-08 DIAGNOSIS — F902 Attention-deficit hyperactivity disorder, combined type: Secondary | ICD-10-CM

## 2023-07-08 DIAGNOSIS — R251 Tremor, unspecified: Secondary | ICD-10-CM | POA: Diagnosis not present

## 2023-07-08 MED ORDER — LITHIUM CARBONATE ER 450 MG PO TBCR: 450.0000 mg | EXTENDED_RELEASE_TABLET | Freq: Every evening | ORAL | 0 refills | Status: DC

## 2023-07-08 MED ORDER — IMIPRAMINE HCL 50 MG PO TABS: 50.0000 mg | ORAL_TABLET | Freq: Every day | ORAL | 1 refills | Status: DC

## 2023-07-08 MED ORDER — BUSPIRONE HCL 15 MG PO TABS: 15.0000 mg | ORAL_TABLET | Freq: Two times a day (BID) | ORAL | 0 refills | Status: DC

## 2023-07-08 MED ORDER — AMPHETAMINE-DEXTROAMPHET ER 15 MG PO CP24: 15.0000 mg | ORAL_CAPSULE | ORAL | 0 refills | Status: DC

## 2023-07-08 NOTE — Progress Notes (Signed)
 Courtney [name] [id number] [birth year] 73 y.o.  Virtual Visit via Telephone Note  I connected with pt by telephone and verified that I am speaking with the correct person using two identifiers.   I discussed the limitations, risks, security and privacy concerns of performing an evaluation and management service by telephone and the availability of in person appointments. I also discussed with the patient that there may be a patient responsible charge related to this service. The patient expressed understanding and agreed to proceed.  I discussed the assessment and treatment plan with the patient. The patient was provided an opportunity to ask questions and all were answered. The patient agreed with the plan and demonstrated an understanding of the instructions.   The patient was advised to call back or seek an in-person evaluation if the symptoms worsen or if the condition fails to improve as anticipated.  I provided 30  minutes of non-face-to-face time during this encounter. The call started at 420 and ended at 450. The patient was located at home and the provider was located office.   Subjective:   Patient ID:  Courtney [name] is a 73 y.o. (DOB 1950-08-15) female.  Chief Complaint:  Chief Complaint  Patient presents with   Follow-up   Depression   Anxiety   Medication Problem     Kenyia [name] presents to the office today for follow-up of first visit 03/01/2020 referred by a friend [name].  Was prescribed Viibryd and Vyvanse was changed to Concerta 54 mg to try to reduce the amount of dry mouth.  05/02/2020 appointment with the following noted: Less dry mouth with Concerta but didn't seem to last beyond 3-4 PM. Viibryd 20 mg daily.  Hard to get enough calories at breakfast.  But has been taking both in the morning.  Some benefit with Viibryd. Is there something I can take besides a stimulant, bc fears it is stressing her body.  Dx college exhausted adrenal glands.   It helps.  Asked questions about alternatives to stimulants.  Reduced Concerta to reduce anxiety and see if can achieve, but she thinks it's worse in the evening. Anxiety is still a struggle.  Life crisis moment.  Chronic marital dissatisfaction worse now that both are retired.  Feels like she's in a prison.  Family notices she's stressed.  Has done therapy since age 67 yo.  Has done Hindu meditation without help.  Christian faith disciplines.  Grew up caretaking.  I'm done and I need to change.  Goal is calm down enough to function through the situation. Tingling foot and wants B12 testing. Plan: Switch Viibryd to 20 mg in evening meal to get better absorption.  06/18/2020 appointment with following noted: No difference in anxiety or mood with Viibryd 20 mg daily.  She doesn't want to increase it. Questions about Viibryd and Genesight testing.  Asks about differences between Adderall and MPH.   Missed for 3 days Concerta and took Adderall.    Tired by 4-5 PM and doesn't like that.    Plan: She wants to wean off the Viibryd because she does not feel it has been helpful and she does not want to increase the dosage.  07/03/2020 phone call patient stating she wanted to continue Viibryd 20 mg daily.  She also requested refill of Concerta 54 mg  08/14/2020 appointment with the following noted: Was told she couldn't take Viibryd with one of the pain meds and had TKR.  So stopped it.  Couldn't tolerate pain meds except tramadol.  Had more pain than expected.   Stopped Viibryd about 07/15/20.  She had taken tramadol with Viibryd in the past without a problem.   Noticing more pain after surgery in non-surgical places. Seems to be sensitive to getting lightheaded and confused with low blood sugar.   Poor attention and scattered since surgery.  Also more tired. Occ taken tramadol and otherwise just Tylenol. Impossible to tell the effect of the Viibryd given she hasn't been herself for months. Concerned about  H's Geoff's memory who is also a patient here.  Wants him to get neuropsych testing. Plan because patient is med sensitive we will start very low-dose fluvoxamine 25 mg nightly Per her request continue Concerta 54 mg every morning  10/25/2020 appointment with the following noted: Several phone calls since being here.  The first led to increasing fluvoxamine to 1-1/2 of the 25 mg tablets due to lack of effect at 3 weeks. 09/21/2020 she called asking to stop Concerta which she did. She called again wanting to start Adderall.  Because she had taken it in 2019 it was agreed that she could start Adderall 10 mg twice daily. Stress dealing with husband and whether to move or not.  May separate.  Anxiety is very high.  Hard to calm down around him. $ stress.  Explodes with anger at husband.  Doesn't think he can sell the house on his own. Never been this stressed and anxious and in this kind of circumstance before. Never got the Adderall DT need for PA. Only caffeine in the AM Plan: Rec increase Luvox from 37.5 mg daily to 50 mg HS for a week then increase to 75 mg daily and possibly higher.  B12 level checked 496 and normal. Hold Adderall until the anxiety is under control.   12/03/2020 phone call from patient: Patient called stating fluvoxamine made her more anxious and high strung and she stopped it.  She wanted an earlier appointment which was not available at the time she was given the option to see a nurse practitioner but refused.  12/19/2020 appointment with the following noted: Life very stressful right now and not getting better.  B in law died and marriage falling apart. Separating.   More than I can handle including anxiety and depression. Plan: Rec trial beta blocker propranolol 10-30 mg  twice daily for anxiety Consider TCA bc Genesight test  B12 level checked 496 and normal. Hold Adderall until the anxiety is under control.   01/01/2021 phone call from patient with nurse as follows: She is  taking up to 30 mg propranolol twice daily without sufficient benefit for her anxiety.  01/03/2021 appointment with the following noted: Rare Adderall.  Propranolol didn't help anxiety much at 30 mg BID without SE.[name]   Thinks she's gotten depressed which is unusual.  In a perfect storm with divorce and moving and financial stress.  Not functioning well.  Hard to make a decision.  Poor productivity.   Having a lot of pain, chronically and worse lately. Starting Lyrica today. Taking alprazolam about 0.5 mg daily. Plan: Yes imipramine 25 and increase to 75 mg HS and then check blood level  02/26/21 appt noted: 01/21/2021 serum imipramine and desipramine total was 65 at 75 mg daily. It has made a difference.  But has made so many changes and moving.  Stress goes to GI system.   Was in ER last night with GI px and may have ulcer.  Unable to eat without nausea.  Lump in the  throat nausea. Mild taking the edge off.  Can still explode. Can't remember the SE at 100 mg daily. GI dominating with pain and nausea. Sleep varies from good or bothered by sickness. Usually fine but sometimes can't turn her brain off.  Moves in 5 days. Plan: Continue imipramine and increase to 100 mg again as soon as nausea managed. Add risperidone 1 mg HS off label for nausea and anxiety.  03/28/2021 appt noted: She is not taking the risperidone.  Tried it for a couple of weeks but did not help the nausea.  Didn't notice it helping anxiety but had tremor.  Off for 2 weeks and tremor better. Still on imipramine 75 mg nightly.  She did increase but wasn't sure it was causing the tremor as instructed. This week to GI and work up ordered and doubled med dose. Not handling the transition well.  Very irritable with husband.  Such a sense of urgency.   Not often with Xanax.   Not on Adderall.   Plan: imipramine and increase to 100 mg nightly.  B12 level checked 496 and normal. Hold Adderall until the anxiety is under control.   Switch Xanax to Ativan 1 mg tid since she is not on Adderall.    05/02/2021 appt noted: Increased imipramine 100 mg HS. Tolerated the increase Spleen bleed. Yesterday decent energy compared to what she had.  Ativan made her sleepy but only took it once. Sleeping a lot DT spleen injury. Can't tell about depression bc of injury kept her in bed.  Has to move to feel normal. Plan: imipramine and increase to 150 mg nightly, based on prior level.  05/29/21 appt noted: SE dry mouth Increased imipramine 150 mg HS Seen improvement in mood.  Meeting irritating situations with less response.  Better self control.  Less fear and anxiety. Ativan 1 mg prn makes her too sleepy and tired. Occ taken 1/2 Adderall with some energy and motivation.   GI work up ongoing with gastric emptying yesterday.  It's delayed ongoing.  GI doctor has commented on her hostility.   Sleeping really well.   Worst dep and anxiety was when moving and now 50% better.  Need to have some goals and since moving into townhouse and unknown what is next.  May move out of town to be near the kids at part of the year but it's too cold.  Big decisions are hard. Difficulty with back pain so hasn't unpacked.   More hopeful about her health. Seasonal depression Plan: Imipramine is helpful but increase could be more helpful based on level.  Disc pros and cons on increase.  She wants to increase it..  Consider lithium augmentation. Increase imipramine to 200 mg nightly, based on prior level Disc light therapy in detail and gave handout.    Adderall prn. Reduce Ativan 1/2 mg tid prn since she is tired from it and not on Adderall usually.    07/05/2021 phone call complaining of dry mouth with imipramine.  Frustrating because of reportedly low sodium and was told to drink less water. MD response:We increased the imipramine from 3 of the 50 mg tablets to 4 of the 50 mg tablets on February 1.  If she has seen additional improvement in her mood it  is to her advantage to continue the current dosage.  If she has not seen additional improvement since increasing the dose she can drop the imipramine back to 3 tablets daily. In addition to that she should discuss the dry  mouth problem with her pharmacist as the pharmacist may have some other recommendations to help manage it.  But the usual recommendation is to switch her toothpaste to Biotene toothpaste and use the Biotene mouthwash and Biotene spray to manage the dry mouth as best she can. Patient response:Notified patient. She said the issue is more complex than just dry mouth. She has been having GI issues and has been very sick, had a splenic bleed, is unable to get out of bed at times. Dr. Shawn Stall at [facility] is her GI doctor. Patient spoke to her after Gloris Manchester talked to her today and is wanting her to stop the imipramine to eliminate that as a source of her issues.   MD response:Okay she can come off the imipramine better mood anxiety and irritability will get worse off of it because it helped her.  However we cannot start any new medicines until we see if her physical symptoms are any different off the medicine.  She can reduce imipramine by 1 tablet every 3 to 4 days.  The dry mouth will not resolve until she is off the medicine.  The splenic bleed is not related to imipramine unless she has some bizarre sort of allergy.  08/01/21 appt noted: Micah Flesher totally off imipramine for a week and GI px not better and restarted imipramine to 100 mg HS DT chronic constipation and then this week increased to 150 just this week.   Therapy with Vikki Ports helping a lot. Overall depression and anxiety is better. Trying to find endo.  Medically cleared from splenic bleed but doesn't think it is fine. Episode of abd paine with result in bed 5 days but back out of bed and functioning again. Hasn't needed lorazepam lately. Recognizes still angry and intense too much .   Plan check level and add lithium 300  10/07/21 appt  noted; Taboo about lithium and didn't take it. Irritability is still focused and directed at H and not sure how it would be wihtout him.  Trying to address this with therapy and faith and prayer.   Seeing Danice Goltz.   Everything fuels my fire.  Erupting this is never gonna change rage.  Recognizes this part is her problem.  Gets upset with him not doing things she wants done. Body getting stronger since Xmas and more she can do the better her depression.   Still some stiffness with pain in spine and leg in AM and then gets better. Not seriously depressed if can move. Don't handle inactivity well.  Able to do more now though has helped. Can't control her reaction to Trey Paula though knows it is over the top since she retired. Intermittent nausea with activity causes her to go to bed.  Waves of pain with it.  Frustrated cause is unknown. Plan: Continue imipramine 150 mg HS for a week and level was WNL at 238. Augment with lithium CR 300 mg HS for anger and mood  01/22/22 appt noted: Currently take imipramine 100 mg HS and lithium CR 300 mg HS, Adderall 10 BID Reduced imipramine to help tremor and it is some better.  Occ tremor, positional.  Mood is a lot better with lithium.  Right away clearing of haze and calming down.  Better outlook and been like that since then. Questions about effects on organs.   Sleep well.   Not sig anxious now.  Occ moments of anxiety.   Asks if could stop imipramine bc benefit with lithium.  04/10/22 appt noted: Currently stopped  imipramine 100 mg HS (2 days ago) and taking lithium CR 300 mg HS, Adderall 10 BID Good but have to stop the imipramine bc of the shaking.  She cut dose to 50 mg QOD. Tremor worse with R hand.  R handed.  Feels like lithium helped She wants to see what mood is like without imipramine.  Less tremor with less imipramine. Tremor is better with less right now. May need shoulder replacement DT pain.  Also needs spinal fusion DT dropped  foot. Hard to talk about it bc so dreads it.   Irritability is there and when ignited is bad but working on it in therapy.    Been angry my whole life.  Back to trauma in childhood.  Overall though better with lithium.   Miralax helped GI problems but waves of ok and then GI px.   Needs Adderall daily. Takes Ativan just occ with stressful circumstances. Plan: on imipramine 100 mg HS for a week and level was WNL at 238. She stopped it this week and disc risk of relapse of dep but she hopes lithium will keep dep at bay Continue lithium CR 300 mg HS bc helped anger and mood  06/17/22 appt noted: recently taking 1/2 lithium 300 and imipramine 50 mg HS for 4-5 days and tremor better with less lithium. CO tremor;   irritates her and makes her feel bad about herself.  Never really tried more 10 mg propranolol for tremor. More pain in shoulder.   Not sure if any change in explosive irritation at H.  Not sure if it is her H or bc of the relationship. Depression is not that bad in the last month and no worse with less imipramine.   Would like to put a spacer between triggers and what she says to H .  Not good.   When in [place] 2 yr ago for 2 mos alone but still had underlying anger problems. Plan: no med changes  07/14/22 appt noted: Increased lithium back to 1 and 1/2 tablet for a month.   Till has it in R hand esp.  Uses propranolol sometimes. Only tried it 4 times. An on impramine 50 mg HS imipramine 100 mg HS for a week and level was WNL at 238. Mood not bad at all  Irritability ?  Better with more lithium.  09/10/22 appt noted: Chronic procrastination unless workig job. Mood is not bad except triggered by H.  No change with increase lithium.  B6 400 BID has reduced and managed tremor. Almost entirely stopped Adderall bc GI sx if not taken with food.  Gut never normal since spleen issue.  Feels something wrong with GI tract.  Cycles of constipation and N and then abd cramping for days at time and  then it will be ok for awhile.   Psych meds imipramine 50 mg HS, lithium 300/450 QOD, Adderall 10 BID prn, lorazepam 1 mg prn (none lately, I don't like to be slowed down). Taking miralax every other day.   Anxiety is not unusuallly high  no panic but some chronic anxiety. Sleep well at night and needs nap if not 8 hours. More postive when on Adderall re: mood and better focused and task completion.    01/01/23 appt noted: Psych meds: Adderall XR 15, cyclobenzaprine 10 mg as needed in the AM for tremor, imipramine 50 mg nightly, lithium ER 450 mg nightly. (Never increased), lorazepam rarely She reduced imipramine to 50 bc she is convinced it is causing  the shaking.  Not very much tremor but some postural.   Mood is ok.  I'm happy.   Has Covid.  Day 4-5 of sx, pretty mild.    03/05/23 appt noted: Psych med: as above, imipramine 50 mg HS, lithium ER 450 mg HS, Adderall XR 15 AM Wonders about ongoing nonstop battle with constipation since colonoscopy.  Also SE dry mouth 3-4 hours.  Level of dep is much better than it was.   Working in therapy with Danice Goltz. H Geoff remains target of eruptive anger "If there was a way to calm me down" Chronic constipation from childhood is worse.   Plan: increase lithium 600 mg every day trial for ongoing anger.   If it helps then can try reducing imipramine Check lithium level and BMP  05/07/23 appt noted: Psych med: as above, imipramine 50 mg HS, lithium ER 450 mg HS, Adderall XR 15 AM She increased lithium to 600 mg daily and didn't notice a change so went back to 450 mg daily and no change noticed. No SE nor benefit.  Unless tremor related.  When gripping the tremor shows up on R not left .  No resting tremor.   Went on trips and very anxiety provoking and didn't notice than in years past.   Not more anxious after taking Adderall.  Still jumps from one thing to the next.  Poor efficiency at home.   Plan trial buspirone  07/08/23 appt noted: Psych  med: as above, imipramine 50 mg HS, lithium ER 450 mg HS, Adderall XR 15 AM daily,  buspirone 15 BID SE : Bothered by tremor.  Is more persistent.  Tremor predated lithium. Mood is good.  Anxiety better when active.  Need to get out of the house to manage it.   Sleep likes to sleep 9-10 hours and can do it consistently. Caffeine 1 daily.  Past Psychiatric Medication Trials:  Trintellix NR, sertraline?,  Viibryd 20, remote zoloft, Paxil, Cymbalta couple days ? Adverse reaction Imipramine 150 better imipramine 100 mg HS for a week and level was WNL at 238. Fluvoxamine SE anxiety but ? Adequacy of trial bc stress Fluvoxamine 100 in 2015 per Dr. 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 Nolen Mu and Deatra Robinson  Started therapy at 6 yo bc fear mother would die any minute bc she had cardiac px.  Review of Systems:  Review of Systems  Cardiovascular:  Negative for chest pain.  Gastrointestinal:  Positive for abdominal pain and nausea.  Musculoskeletal:  Positive for arthralgias and back pain.  Neurological:  Positive for dizziness and tremors. Negative for weakness.  Psychiatric/Behavioral:  Positive for dysphoric mood. Negative for agitation, confusion, decreased concentration and hallucinations. The patient is nervous/anxious.     Medications: I have reviewed the patient's current medications.  Current Outpatient Medications  Medication Sig Dispense Refill   albuterol (VENTOLIN HFA) 108 (90 Base) MCG/ACT inhaler Inhale 2 puffs into the lungs every 4 (four) hours as needed for wheezing or shortness of breath. 18 g 3   amphetamine-dextroamphetamine (ADDERALL XR) 15 MG 24 hr capsule Take 1 capsule by mouth every morning. 30 capsule 0   busPIRone (BUSPAR) 15 MG tablet Take 1/3 tablet p.o.  twice daily for 1 week, then take 2/3 tablet p.o. twice daily for 1 week,  then take 1 tablet p.o. twice daily 60 tablet 1   cyclobenzaprine (FLEXERIL) 10 MG tablet Take 10 mg by mouth as needed.     fluticasone (FLONASE) 50 MCG/ACT nasal spray Place into both nostrils daily.     hydroxychloroquine (PLAQUENIL) 200 MG tablet Take 1 tablet by mouth 2 (two) times daily.     imipramine (TOFRANIL) 50 MG tablet Take 1 tablet (50 mg total) by mouth at bedtime. 90 tablet 1   levothyroxine (SYNTHROID) 50 MCG tablet Take 50 mcg by mouth daily before breakfast.     lithium carbonate (ESKALITH) 450 MG ER tablet Take 1 tablet (450 mg total) by mouth at bedtime. 90 tablet 0   LORazepam (ATIVAN) 1 MG tablet Take 1 tablet (1 mg total) by mouth every 8 (eight) hours. (Patient taking differently: Take 1 mg by mouth as needed.) 90 tablet 1   omega-3 acid ethyl esters (LOVAZA) 1 g capsule Take 2 capsules (2 g total) by mouth 2 (two) times daily. 120 capsule 11   ondansetron (ZOFRAN-ODT) 4 MG disintegrating tablet Take 1 tablet (4 mg total) by mouth every 8 (eight) hours as needed. 20 tablet 0   pravastatin (PRAVACHOL) 20 MG tablet Take one tablet by mouth 4 days per week 48 tablet 3   Testosterone 1.62 % GEL Place 5 mg onto the skin daily. 30 g 3   traMADol (ULTRAM) 50 MG tablet TAKE 1/2 TO 1 TABLET(25 TO 50 MG) BY MOUTH BACK EVERY 8 HOURS AS NEEDED FOR SEVERE PAIN 90 tablet 0   triamcinolone cream (KENALOG) 0.5 % Apply 1 Application topically 3 (three) times daily. 30 g 1   vitamin E 400 UNIT capsule Take 800 Units by mouth daily.      nitroGLYCERIN (NITROSTAT) 0.4 MG SL tablet Place 1 tablet (0.4 mg total) under the tongue every 5 (five) minutes as needed for chest pain. (Patient not taking: Reported on 05/25/2023) 25 tablet 2   No current facility-administered medications for this visit.    Medication Side Effects: Other: dry mouth  Allergies:  Allergies  Allergen Reactions   Clarithromycin Nausea Only    Covid-19 (Mrna) Vaccine Other (See Comments)    Acute Vasculitis; excessive bleeding  Other reaction(s): Other (See Comments) Acute Vasculitis; excessive bleeding    Other Other (See Comments)   Bactrim [Sulfamethoxazole-Trimethoprim] Nausea And Vomiting   Morphine Nausea And Vomiting    Other reaction(s): Nausea/Vomiting   Morphine And Codeine Nausea And Vomiting   Azithromycin Nausea And Vomiting and Nausea Only   Ceclor [Cefaclor] Nausea And Vomiting    Can take Augmentin ok   Claritin [Loratadine]     REACTION: bruises   Codeine Nausea And Vomiting    Can take Tramadol ok   Cymbalta [Duloxetine Hcl]    Erythromycin Nausea And Vomiting    Other reaction(s): Nausea/Vomiting   Erythromycin Base Nausea And Vomiting   Levofloxacin Nausea Only    REACTION: nausea   Lipitor [Atorvastatin]     Other reaction(s): Myalgias (intolerance)   Nitrofurantoin Nausea Only   Oxycodone Other (See Comments)   Penicillins Nausea And Vomiting    Can take Augmentin   Propoxyphene Nausea Only    dizzy   Propoxyphene N-Acetaminophen Nausea Only    dizzy    Past Medical History:  Diagnosis Date   ADD (attention deficit disorder)    Anxiety    Celiac disease    possible vs IBS   Complication of anesthesia    Coronary artery disease  Depression    no bipolar per Dr. Nolen Mu   GI problem    Juanda Chance   Gluten intolerance    Hyperlipidemia    IBS (irritable bowel syndrome)    Osteoarthritis    Osteoporosis    Pneumonia    PONV (postoperative nausea and vomiting)    Rheumatoid arthritis (HCC)    Seasonal allergies 01/08/2012   hx. of multiple bronchitis related to this.   Shoulder pain, right     Family History  Problem Relation Age of Onset   Heart disease Mother    Heart attack Mother    Prostate cancer Father    Heart disease Sister    Heart disease Maternal Grandmother    Breast cancer Paternal Grandmother    Colon cancer Paternal Grandfather    Coronary artery disease  Other        FH Female 1st degree relative <60   ADD / ADHD Other    Other Daughter        gluten intolerance    Social History   Socioeconomic History   Marital status: Married    Spouse name: Not on file   Number of children: 2   Years of education: Not on file   Highest education level: Not on file  Occupational History   Occupation: Network engineer  Tobacco Use   Smoking status: Never   Smokeless tobacco: Never   Tobacco comments:    only Financial trader   Vaping status: Never Used  Substance and Sexual Activity   Alcohol use: Yes    Comment: 0-1 per day   Drug use: No   Sexual activity: Yes  Other Topics Concern   Not on file  Social History Narrative   Married for last 42 years.Lives with husband.Retired Human resources officer.Originally from [place].   Social Drivers of Corporate investment banker Strain: Low Risk  (06/08/2023)   Overall Financial Resource Strain (CARDIA)    Difficulty of Paying Living Expenses: Not hard at all  Food Insecurity: No Food Insecurity (06/08/2023)   Hunger Vital Sign    Worried About Running Out of Food in the Last Year: Never true    Ran Out of Food in the Last Year: Never true  Transportation Needs: No Transportation Needs (06/08/2023)   PRAPARE - Administrator, Civil Service (Medical): No    Lack of Transportation (Non-Medical): No  Physical Activity: Sufficiently Active (06/08/2023)   Exercise Vital Sign    Days of Exercise per Week: 4 days    Minutes of Exercise per Session: 120 min  Stress: No Stress Concern Present (06/08/2023)   [employer] of Occupational Health - Occupational Stress Questionnaire    Feeling of Stress : Not at all  Social Connections: Socially Integrated (06/08/2023)   Social Connection and Isolation Panel [NHANES]    Frequency of Communication with Friends and Family: More than three times a week    Frequency of Social Gatherings with Friends and Family: More than three  times a week    Attends Religious Services: More than 4 times per year    Active Member of [employer] or Organizations: Yes    Attends Engineer, structural: More than 4 times per year    Marital Status: Married  Catering manager Violence: Not At Risk (06/08/2023)   Humiliation, Afraid, Rape, and Kick questionnaire    Fear of Current or Ex-Partner: No    Emotionally Abused: No    Physically Abused: No  Sexually Abused: No    Past Medical History, Surgical history, Social history, and Family history were reviewed and updated as appropriate.   Please see review of systems for further details on the patient's review from today.   Objective:   Physical Exam:  LMP 01/08/1999   Physical Exam Constitutional:      General: She is not in acute distress. Musculoskeletal:        General: No deformity.  Neurological:     Mental Status: She is alert and oriented to person, place, and time.     Cranial Nerves: No dysarthria.     Coordination: Coordination normal.  Psychiatric:        Attention and Perception: Attention and perception normal. She does not perceive auditory or visual hallucinations.        Mood and Affect: Mood is anxious. Mood is not depressed. Affect is not labile, blunt, angry, tearful or inappropriate.        Speech: Speech normal. Speech is not rapid and pressured.        Behavior: Behavior normal. Behavior is cooperative.        Thought Content: Thought content normal. Thought content is not paranoid or delusional. Thought content does not include homicidal or suicidal ideation. Thought content does not include suicidal plan.        Cognition and Memory: Cognition and memory normal.        Judgment: Judgment normal.     Comments: Insight intact Anger at home.  Not in office     Lab Review:     Component Value Date/Time   NA 135 06/29/2023 1443   NA 134 06/14/2018 1444   K 4.7 06/29/2023 1443   CL 104 06/29/2023 1443   CO2 23 06/29/2023 1443   GLUCOSE 92  06/29/2023 1443   BUN 21 06/29/2023 1443   BUN 10 06/14/2018 1444   CREATININE 0.88 06/29/2023 1443   CALCIUM 9.5 06/29/2023 1443   PROT 6.6 08/27/2022 0940   PROT 6.5 12/19/2020 0754   ALBUMIN 4.0 08/27/2022 0940   ALBUMIN 4.5 12/19/2020 0754   AST 23 08/27/2022 0940   ALT 17 08/27/2022 0940   ALKPHOS 53 08/27/2022 0940   BILITOT 0.3 08/27/2022 0940   BILITOT 0.4 12/19/2020 0754   GFRNONAA 60 (L) 04/17/2021 1825   GFRNONAA 79 04/10/2020 1431   GFRAA 91 04/10/2020 1431       Component Value Date/Time   WBC 6.2 08/27/2022 0940   RBC 4.36 08/27/2022 0940   HGB 13.8 08/27/2022 0940   HCT 41.6 08/27/2022 0940   PLT 350.0 08/27/2022 0940   MCV 95.5 08/27/2022 0940   MCH 31.4 04/17/2021 1825   MCHC 33.0 08/27/2022 0940   RDW 13.7 08/27/2022 0940   LYMPHSABS 1.4 08/27/2022 0940   MONOABS 0.5 08/27/2022 0940   EOSABS 1.0 (H) 08/27/2022 0940   BASOSABS 0.1 08/27/2022 0940    Lithium Lvl  Date Value Ref Range Status  06/29/2023 0.6 0.6 - 1.2 mmol/L Final     No results found for: "PHENYTOIN", "PHENOBARB", "VALPROATE", "CBMZ"   06/04/20 B12 normal at 496  08/16/21   Latest Reference Range & Units 08/16/21 08:29  Desipramine mcg/L 128  Imipramine Lvl mcg/L 110   01/21/2021 imipramine level 25, desipramine level 40 equals total 65 which is low on 75 mg daily. 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 They have one copy of the C allele and one copy of the G allele. This genotype suggests a normal response to certain ADHD medications.  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 This genotype suggests a lower risk of serious hypersensitivity reactions, including Stevens-Johnson syndrome (SJS),  toxic epidermal necrolysis (TEN), maculopapular eruptions, and Drug Reaction with Eosinophilia and Systemic Symptoms when taking certain mood stabilizers.  HLA-B*1502Lower Risk Not Present This patient does not carry the HLA-B*1502 allele or a closely related *15 allele. Absence of HLA-B*1502 and the closely related *15 alleles suggests lower risk of serious dermatologic reactions including toxic epidermal necrolysis (TEN) and Stevens-Johnson syndrome (SJS) when taking certain mood stabilizers.  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 They carry two copies of the G allele. This genotype has been associated with an increased risk of adverse drug reactions with certain selective serotonin reuptake inhibitors.  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 The short promoter allele is reported to decrease expression of the serotonin transporter compared to the homozygous long promoter allele. The patient may have a moderately decreased likelihood of response to certain selective serotonin reuptake inhibitors due to the presence of the short form of the gene.  Pharmacokinetic Genes PK CES1A1Extensive (Normal) Metabolizer GLY/GLY CES1A1 - Gly allele enzyme activity: Normal CES1A1 - Gly allele enzyme activity: Normal  This genotype is most consistent with the extensive (normal) metabolizer phenotype.  The patient is expected to have normal enzyme activity.  CYP1A2Extensive (Normal) Metabolizer *1/*1 This genotype is most consistent with the extensive (normal) metabolizer phenotype.  CYP2B6Poor Metabolizer *6/*6 CYP2B6*6 allele enzyme activity: Reduced CYP2B6*6 allele enzyme activity: Reduced  This genotype is most consistent with the poor metabolizer phenotype. This patient may have reduced  enzyme activity as compared to individuals with the normal phenotype.  CYP2C19Extensive (Normal) Metabolizer *1/*1 CYP2C19*1 allele enzyme activity: Normal CYP2C19*1 allele enzyme activity: Normal  This genotype is most consistent with the extensive (normal) metabolizer phenotype.  CYP2C9Intermediate Metabolizer *1/*2 CYP2C9*1 allele enzyme activity: Normal CYP2C9*2 allele enzyme activity: Reduced  This genotype is most consistent with the intermediate metabolizer phenotype. This patient may have reduced enzyme activity as compared to individuals with the normal phenotype.  CYP2D6Extensive (Normal) Metabolizer *1/*41 CYP2D6*1 allele enzyme activity: Normal CYP2D6*41 allele enzyme activity: Reduced  This genotype is most consistent with the extensive (normal) metabolizer phenotype.  CYP3A4Poor Metabolizer *22/*22 CYP3A4*22 allele enzyme activity: Reduced CYP3A4*22 allele enzyme activity: Reduced  This genotype is most consistent with the poor metabolizer phenotype. This patient may have reduced enzyme activity as compared to individuals with the normal phenotype.  UGT1A4Ultrarapid Metabolizer *1/*3 UGT1A4*1 allele enzyme activity: Normal UGT1A4*3 allele enzyme activity: Increased  This genotype is most consistent with the ultrarapid metabolizer phenotype. This patient may have increased enzyme activity as compared to individuals with the normal phenotype.  UGT2B15Intermediate Metabolizer *2/*2 UGT2B15*2 allele enzyme activity: Reduced UGT2B15*2 allele enzyme activity: Reduced  This genotype is most consistent with the intermediate metabolizer phenotype. This patient may have reduced enzyme activity as compared to individuals with the normal phenotype. .res Assessment: Plan:    Milli was seen today for follow-up, depression, anxiety and medication problem.  Diagnoses and all orders for this visit:  Major depressive disorder, recurrent episode, moderate  (HCC)  Generalized anxiety  disorder  Attention deficit hyperactivity disorder (ADHD), combined type  Tremor of unknown origin  Lithium use    30 min phone time with patient was spent on counseling and coordination of care. We discussed    Overall depression is better and anxiety was better too but not gone and worse without lithium.   Overall more irritable than depressed but just with H.  Some chronic anxiety but not severe now. She is getting some benefit of Adderall but feels like it wears off about 2 to 3 PM she would want longer duration. Rec she not change meds on her own.  This has been an issue  Chose TCA bc Genesight test;  disc SE in detail incl cardiac, etc on imipramine 100 mg HS for a week and level was WNL at 238. She is doing ok with imipramine 50 mg HS and doesn't want change but is concerned tremor that started with it. Disc alternative of change but this worked and failed multiple other meds. Propranolol 20-40 mg AM prn tremor.  Didn't help anxiety but may help tremor which is from imipramine not lithium.   Drop lithium back to ER 450 bc no benefit noted with 600 mg daily  buspirone for anxiety and irritability 15 mg BID: Discussed potential benefits, risks, and side effects of BuSpar.  Patient agrees to trial of BuSpar.     B12 level checked 496 and normal.  Adderall XR to 15 mg daily to provide more consistency.  Helps  productive and helps anxiety and function and depression.  ADD not good enough.  Consider increase if can better control irritability and anxiety It is okay to use as needed Adderall 7.5 mg either before the XR dose or after the XR dose to extend duration.  Not currently using , Ativan 1/2 mg tid prn since she is tired from it and not on Adderall usually.    Consider alternatives for anger outbursts, VPA, atypical.  Constipation management  extensively discussed again and types of fiber.  1.  Lots of water 2.  Powdered fiber supplement such  as MiraLAX, Citrucel, etc. preferably with a meal 3.  2 stool softeners a day 4.  Milk of magnesia or magnesium tablets if needed She has seen multiple GI docs for this over the years.  Supportive therapy around marital crisis and other stressors.  She needs some goals.  Needs to get out of the house chronically.  Started therapy with Danice Goltz.  FU 8-12 weeks  Meredith Staggers, MD, DFAPA   Please see After Visit Summary for patient specific instructions.  Future Appointments  Date Time Provider Department Center  06/08/2024  9:30 AM [facility]ANNUAL [facility]NESS VISIT [facility] None     No orders of the defined types were placed in this encounter.     -------------------------------

## 2023-07-13 DIAGNOSIS — E039 Hypothyroidism, unspecified: Secondary | ICD-10-CM | POA: Diagnosis not present

## 2023-07-16 DIAGNOSIS — F411 Generalized anxiety disorder: Secondary | ICD-10-CM | POA: Diagnosis not present

## 2023-07-20 ENCOUNTER — Telehealth: Payer: Self-pay | Admitting: Psychiatry

## 2023-07-20 NOTE — Telephone Encounter (Signed)
 Pt said she has been totally off the imipramine for 3 days and is noting a 50% improvement. She also wanted to verify lithium dose, which is to be 450.   From 3/12 visit:  Chose TCA bc Genesight test;  disc SE in detail incl cardiac, etc on imipramine 100 mg HS for a week and level was WNL at 238. She is doing ok with imipramine 50 mg HS and doesn't want change but is concerned tremor that started with it. Disc alternative of change but this worked and failed multiple other meds. Propranolol 20-40 mg AM prn tremor.  Didn't help anxiety but may help tremor which is from imipramine not lithium.

## 2023-07-20 NOTE — Telephone Encounter (Signed)
 Courtney [name] called at 11:15 to follow up from medication changes made at last appt.  She has seen at least 50% improvement of the tremors. Also, she needs to verify the dose of lithium she should be taking.  Appt 5/27

## 2023-07-20 NOTE — Telephone Encounter (Signed)
 The lithium dose is unchanged at ER 450 mg HS.  I don't see any reference in my notes to her stopping imipramine.  She must have decided this on her own.  Beware of the risk of more depression in the next month or so.  She may need to restart it if this happens but her decision.  We wont' make any other med changes to wait and see what happens.

## 2023-07-21 NOTE — Telephone Encounter (Signed)
 Reviewed recommendations with patient. I told her yesterday I didn't see mention of her stopping the imipramine and she said she was sure that it was discussed at her appt. I told her that Dr. Jennelle Human said he didn't see any mention in his note either.

## 2023-07-24 DIAGNOSIS — Z791 Long term (current) use of non-steroidal anti-inflammatories (NSAID): Secondary | ICD-10-CM | POA: Diagnosis not present

## 2023-07-24 DIAGNOSIS — M48061 Spinal stenosis, lumbar region without neurogenic claudication: Secondary | ICD-10-CM | POA: Diagnosis not present

## 2023-07-24 DIAGNOSIS — M4800 Spinal stenosis, site unspecified: Secondary | ICD-10-CM | POA: Diagnosis not present

## 2023-07-24 DIAGNOSIS — M4726 Other spondylosis with radiculopathy, lumbar region: Secondary | ICD-10-CM | POA: Diagnosis not present

## 2023-07-24 DIAGNOSIS — N281 Cyst of kidney, acquired: Secondary | ICD-10-CM | POA: Diagnosis not present

## 2023-07-24 DIAGNOSIS — M51372 Other intervertebral disc degeneration, lumbosacral region with discogenic back pain and lower extremity pain: Secondary | ICD-10-CM | POA: Diagnosis not present

## 2023-07-24 DIAGNOSIS — M545 Low back pain, unspecified: Secondary | ICD-10-CM | POA: Diagnosis not present

## 2023-07-24 DIAGNOSIS — M4317 Spondylolisthesis, lumbosacral region: Secondary | ICD-10-CM | POA: Diagnosis not present

## 2023-08-06 DIAGNOSIS — F411 Generalized anxiety disorder: Secondary | ICD-10-CM | POA: Diagnosis not present

## 2023-08-07 DIAGNOSIS — M4316 Spondylolisthesis, lumbar region: Secondary | ICD-10-CM | POA: Diagnosis not present

## 2023-08-13 DIAGNOSIS — F411 Generalized anxiety disorder: Secondary | ICD-10-CM | POA: Diagnosis not present

## 2023-08-17 ENCOUNTER — Telehealth: Payer: Self-pay | Admitting: Psychiatry

## 2023-08-17 ENCOUNTER — Other Ambulatory Visit: Payer: Self-pay

## 2023-08-17 DIAGNOSIS — F902 Attention-deficit hyperactivity disorder, combined type: Secondary | ICD-10-CM

## 2023-08-17 MED ORDER — AMPHETAMINE-DEXTROAMPHET ER 15 MG PO CP24: 15.0000 mg | ORAL_CAPSULE | ORAL | 0 refills | Status: DC

## 2023-08-17 NOTE — Telephone Encounter (Signed)
 Pended 2 RF for Adderall XR 15 to [facility] in [place].

## 2023-08-17 NOTE — Telephone Encounter (Signed)
 Pt called 11:58 requesting Rx adderall XR 15 mg to [facility] Apt 5/27

## 2023-08-21 DIAGNOSIS — F411 Generalized anxiety disorder: Secondary | ICD-10-CM | POA: Diagnosis not present

## 2023-08-26 DIAGNOSIS — M47894 Other spondylosis, thoracic region: Secondary | ICD-10-CM | POA: Diagnosis not present

## 2023-08-26 DIAGNOSIS — M9902 Segmental and somatic dysfunction of thoracic region: Secondary | ICD-10-CM | POA: Diagnosis not present

## 2023-08-26 DIAGNOSIS — M9903 Segmental and somatic dysfunction of lumbar region: Secondary | ICD-10-CM | POA: Diagnosis not present

## 2023-08-26 DIAGNOSIS — M9904 Segmental and somatic dysfunction of sacral region: Secondary | ICD-10-CM | POA: Diagnosis not present

## 2023-08-26 DIAGNOSIS — M5431 Sciatica, right side: Secondary | ICD-10-CM | POA: Diagnosis not present

## 2023-08-26 DIAGNOSIS — M4726 Other spondylosis with radiculopathy, lumbar region: Secondary | ICD-10-CM | POA: Diagnosis not present

## 2023-08-27 DIAGNOSIS — Z1231 Encounter for screening mammogram for malignant neoplasm of breast: Secondary | ICD-10-CM | POA: Diagnosis not present

## 2023-08-27 DIAGNOSIS — N958 Other specified menopausal and perimenopausal disorders: Secondary | ICD-10-CM | POA: Diagnosis not present

## 2023-08-27 DIAGNOSIS — Z01419 Encounter for gynecological examination (general) (routine) without abnormal findings: Secondary | ICD-10-CM | POA: Diagnosis not present

## 2023-08-27 DIAGNOSIS — Z6824 Body mass index (BMI) 24.0-24.9, adult: Secondary | ICD-10-CM | POA: Diagnosis not present

## 2023-08-27 DIAGNOSIS — M8588 Other specified disorders of bone density and structure, other site: Secondary | ICD-10-CM | POA: Diagnosis not present

## 2023-09-01 DIAGNOSIS — Z13228 Encounter for screening for other metabolic disorders: Secondary | ICD-10-CM | POA: Diagnosis not present

## 2023-09-01 DIAGNOSIS — Z1329 Encounter for screening for other suspected endocrine disorder: Secondary | ICD-10-CM | POA: Diagnosis not present

## 2023-09-01 DIAGNOSIS — Z131 Encounter for screening for diabetes mellitus: Secondary | ICD-10-CM | POA: Diagnosis not present

## 2023-09-01 DIAGNOSIS — Z13220 Encounter for screening for lipoid disorders: Secondary | ICD-10-CM | POA: Diagnosis not present

## 2023-09-01 DIAGNOSIS — Z130 Encounter for screening for diseases of the blood and blood-forming organs and certain disorders involving the immune mechanism: Secondary | ICD-10-CM | POA: Diagnosis not present

## 2023-09-03 DIAGNOSIS — F411 Generalized anxiety disorder: Secondary | ICD-10-CM | POA: Diagnosis not present

## 2023-09-14 DIAGNOSIS — M4316 Spondylolisthesis, lumbar region: Secondary | ICD-10-CM | POA: Diagnosis not present

## 2023-09-14 DIAGNOSIS — M545 Low back pain, unspecified: Secondary | ICD-10-CM | POA: Diagnosis not present

## 2023-09-16 DIAGNOSIS — Z79899 Other long term (current) drug therapy: Secondary | ICD-10-CM | POA: Diagnosis not present

## 2023-09-16 DIAGNOSIS — M1991 Primary osteoarthritis, unspecified site: Secondary | ICD-10-CM | POA: Diagnosis not present

## 2023-09-16 DIAGNOSIS — M5459 Other low back pain: Secondary | ICD-10-CM | POA: Diagnosis not present

## 2023-09-16 DIAGNOSIS — M0579 Rheumatoid arthritis with rheumatoid factor of multiple sites without organ or systems involvement: Secondary | ICD-10-CM | POA: Diagnosis not present

## 2023-09-16 DIAGNOSIS — Z6823 Body mass index (BMI) 23.0-23.9, adult: Secondary | ICD-10-CM | POA: Diagnosis not present

## 2023-09-17 DIAGNOSIS — F411 Generalized anxiety disorder: Secondary | ICD-10-CM | POA: Diagnosis not present

## 2023-09-22 ENCOUNTER — Ambulatory Visit (INDEPENDENT_AMBULATORY_CARE_PROVIDER_SITE_OTHER): Admitting: Psychiatry

## 2023-09-22 ENCOUNTER — Encounter: Payer: Self-pay | Admitting: Psychiatry

## 2023-09-22 DIAGNOSIS — F331 Major depressive disorder, recurrent, moderate: Secondary | ICD-10-CM

## 2023-09-22 DIAGNOSIS — F411 Generalized anxiety disorder: Secondary | ICD-10-CM | POA: Diagnosis not present

## 2023-09-22 DIAGNOSIS — G251 Drug-induced tremor: Secondary | ICD-10-CM | POA: Diagnosis not present

## 2023-09-22 DIAGNOSIS — Z79899 Other long term (current) drug therapy: Secondary | ICD-10-CM | POA: Diagnosis not present

## 2023-09-22 DIAGNOSIS — F902 Attention-deficit hyperactivity disorder, combined type: Secondary | ICD-10-CM | POA: Diagnosis not present

## 2023-09-22 DIAGNOSIS — R251 Tremor, unspecified: Secondary | ICD-10-CM

## 2023-09-22 MED ORDER — OXCARBAZEPINE 150 MG PO TABS: ORAL_TABLET | ORAL | 1 refills | Status: DC

## 2023-09-22 MED ORDER — AMPHETAMINE-DEXTROAMPHETAMINE 15 MG PO TABS
7.5000 mg | ORAL_TABLET | Freq: Two times a day (BID) | ORAL | 0 refills | Status: DC
Start: 2023-09-22 — End: 2023-11-19

## 2023-09-22 NOTE — Progress Notes (Signed)
 Courtney [name] [id number] January 09, 1951 73 y.o.   Subjective:   Patient ID:  Courtney [name] is a 73 y.o. (DOB 03-10-1951) female.  Chief Complaint:  Chief Complaint  Patient presents with   Follow-up   Depression   Anxiety   Medication Problem     Srija [name] presents to the office today for follow-up of first visit 03/01/2020 referred by a friend [name].  Was prescribed Viibryd  and Vyvanse was changed to Concerta  54 mg to try to reduce the amount of dry mouth.  05/02/2020 appointment with the following noted: Less dry mouth with Concerta  but didn't seem to last beyond 3-4 PM. Viibryd  20 mg daily.  Hard to get enough calories at breakfast.  But has been taking both in the morning.  Some benefit with Viibryd . Is there something I can take besides a stimulant, bc fears it is stressing her body.  Dx college exhausted adrenal glands.  It helps.  Asked questions about alternatives to stimulants.  Reduced Concerta  to reduce anxiety and see if can achieve, but she thinks it's worse in the evening. Anxiety is still a struggle.  Life crisis moment.  Chronic marital dissatisfaction worse now that both are retired.  Feels like she's in a prison.  Family notices she's stressed.  Has done therapy since age 83 yo.  Has done Hindu meditation without help.  Christian faith disciplines.  Grew up caretaking.  I'm done and I need to change.  Goal is calm down enough to function through the situation. Tingling foot and wants B12 testing. Plan: Switch Viibryd  to 20 mg in evening meal to get better absorption.  06/18/2020 appointment with following noted: No difference in anxiety or mood with Viibryd  20 mg daily.  She doesn't want to increase it. Questions about Viibryd  and Genesight testing.  Asks about differences between Adderall and MPH.   Missed for 3 days Concerta  and took Adderall.    Tired by 4-5 PM and doesn't like that.    Plan: She wants to wean off the Viibryd  because she does  not feel it has been helpful and she does not want to increase the dosage.  07/03/2020 phone call patient stating she wanted to continue Viibryd  20 mg daily.  She also requested refill of Concerta  54 mg  08/14/2020 appointment with the following noted: Was told she couldn't take Viibryd  with one of the pain meds and had TKR.  So stopped it.  Couldn't tolerate pain meds except tramadol .  Had more pain than expected.   Stopped Viibryd  about 07/15/20.  She had taken tramadol  with Viibryd  in the past without a problem.   Noticing more pain after surgery in non-surgical places. Seems to be sensitive to getting lightheaded and confused with low blood sugar.   Poor attention and scattered since surgery.  Also more tired. Occ taken tramadol  and otherwise just Tylenol . Impossible to tell the effect of the Viibryd  given she hasn't been herself for months. Concerned about H's Geoff's memory who is also a patient here.  Wants him to get neuropsych testing. Plan because patient is med sensitive we will start very low-dose fluvoxamine  25 mg nightly Per her request continue Concerta  54 mg every morning  10/25/2020 appointment with the following noted: Several phone calls since being here.  The first led to increasing fluvoxamine  to 1-1/2 of the 25 mg tablets due to lack of effect at 3 weeks. 09/21/2020 she called asking to stop Concerta  which she did. She called again wanting to start Adderall.  Because she had taken it in 2019 it was agreed that she could start Adderall 10 mg twice daily. Stress dealing with husband and whether to move or not.  May separate.  Anxiety is very high.  Hard to calm down around him. $ stress.  Explodes with anger at husband.  Doesn't think he can sell the house on his own. Never been this stressed and anxious and in this kind of circumstance before. Never got the Adderall DT need for PA. Only caffeine in the AM Plan: Rec increase Luvox  from 37.5 mg daily to 50 mg HS for a week then  increase to 75 mg daily and possibly higher.  B12 level checked 496 and normal. Hold Adderall until the anxiety is under control.   12/03/2020 phone call from patient: Patient called stating fluvoxamine  made her more anxious and high strung and she stopped it.  She wanted an earlier appointment which was not available at the time she was given the option to see a nurse practitioner but refused.  12/19/2020 appointment with the following noted: Life very stressful right now and not getting better.  B in law died and marriage falling apart. Separating.   More than I can handle including anxiety and depression. Plan: Rec trial beta blocker propranolol  10-30 mg  twice daily for anxiety Consider TCA bc Genesight test  B12 level checked 496 and normal. Hold Adderall until the anxiety is under control.   01/01/2021 phone call from patient with nurse as follows: She is taking up to 30 mg propranolol  twice daily without sufficient benefit for her anxiety.  01/03/2021 appointment with the following noted: Rare Adderall.  Propranolol  didn't help anxiety much at 30 mg BID without SE.Aaron Aas   Thinks she's gotten depressed which is unusual.  In a perfect storm with divorce and moving and financial stress.  Not functioning well.  Hard to make a decision.  Poor productivity.   Having a lot of pain, chronically and worse lately. Starting Lyrica  today. Taking alprazolam  about 0.5 mg daily. Plan: Yes imipramine  25 and increase to 75 mg HS and then check blood level  02/26/21 appt noted: 01/21/2021 serum imipramine  and desipramine  total was 65 at 75 mg daily. It has made a difference.  But has made so many changes and moving.  Stress goes to GI system.   Was in ER last night with GI px and may have ulcer.  Unable to eat without nausea.  Lump in the throat nausea. Mild taking the edge off.  Can still explode. Can't remember the SE at 100 mg daily. GI dominating with pain and nausea. Sleep varies from good or bothered by  sickness. Usually fine but sometimes can't turn her brain off.  Moves in 5 days. Plan: Continue imipramine  and increase to 100 mg again as soon as nausea managed. Add risperidone  1 mg HS off label for nausea and anxiety.  03/28/2021 appt noted: She is not taking the risperidone .  Tried it for a couple of weeks but did not help the nausea.  Didn't notice it helping anxiety but had tremor.  Off for 2 weeks and tremor better. Still on imipramine  75 mg nightly.  She did increase but wasn't sure it was causing the tremor as instructed. This week to GI and work up ordered and doubled med dose. Not handling the transition well.  Very irritable with husband.  Such a sense of urgency.   Not often with Xanax .   Not on Adderall.   Plan: imipramine   and increase to 100 mg nightly.  B12 level checked 496 and normal. Hold Adderall until the anxiety is under control.  Switch Xanax  to Ativan  1 mg tid since she is not on Adderall.    05/02/2021 appt noted: Increased imipramine  100 mg HS. Tolerated the increase Spleen bleed. Yesterday decent energy compared to what she had.  Ativan  made her sleepy but only took it once. Sleeping a lot DT spleen injury. Can't tell about depression bc of injury kept her in bed.  Has to move to feel normal. Plan: imipramine  and increase to 150 mg nightly, based on prior level.  05/29/21 appt noted: SE dry mouth Increased imipramine  150 mg HS Seen improvement in mood.  Meeting irritating situations with less response.  Better self control.  Less fear and anxiety. Ativan  1 mg prn makes her too sleepy and tired. Occ taken 1/2 Adderall with some energy and motivation.   GI work up ongoing with gastric emptying yesterday.  It's delayed ongoing.  GI doctor has commented on her hostility.   Sleeping really well.   Worst dep and anxiety was when moving and now 50% better.  Need to have some goals and since moving into townhouse and unknown what is next.  May move out of town to be near  the kids at part of the year but it's too cold.  Big decisions are hard. Difficulty with back pain so hasn't unpacked.   More hopeful about her health. Seasonal depression Plan: Imipramine  is helpful but increase could be more helpful based on level.  Disc pros and cons on increase.  She wants to increase it..  Consider lithium  augmentation. Increase imipramine  to 200 mg nightly, based on prior level Disc light therapy in detail and gave handout.    Adderall prn. Reduce Ativan  1/2 mg tid prn since she is tired from it and not on Adderall usually.    07/05/2021 phone call complaining of dry mouth with imipramine .  Frustrating because of reportedly low sodium and was told to drink less water . MD response:We increased the imipramine  from 3 of the 50 mg tablets to 4 of the 50 mg tablets on February 1.  If she has seen additional improvement in her mood it is to her advantage to continue the current dosage.  If she has not seen additional improvement since increasing the dose she can drop the imipramine  back to 3 tablets daily. In addition to that she should discuss the dry mouth problem with her pharmacist as the pharmacist may have some other recommendations to help manage it.  But the usual recommendation is to switch her toothpaste to Biotene toothpaste and use the Biotene mouthwash and Biotene spray to manage the dry mouth as best she can. Patient response:Notified patient. She said the issue is more complex than just dry mouth. She has been having GI issues and has been very sick, had a splenic bleed, is unable to get out of bed at times. Dr. Ericka Hauser at [facility] is her GI doctor. Patient spoke to her after Sophia Dustman talked to her today and is wanting her to stop the imipramine  to eliminate that as a source of her issues.   MD response:Okay she can come off the imipramine  better mood anxiety and irritability will get worse off of it because it helped her.  However we cannot start any new medicines until we  see if her physical symptoms are any different off the medicine.  She can reduce imipramine  by 1 tablet every 3  to 4 days.  The dry mouth will not resolve until she is off the medicine.  The splenic bleed is not related to imipramine  unless she has some bizarre sort of allergy .  08/01/21 appt noted: Shelah Derry totally off imipramine  for a week and GI px not better and restarted imipramine  to 100 mg HS DT chronic constipation and then this week increased to 150 just this week.   Therapy with Tarri Farm helping a lot. Overall depression and anxiety is better. Trying to find endo.  Medically cleared from splenic bleed but doesn't think it is fine. Episode of abd paine with result in bed 5 days but back out of bed and functioning again. Hasn't needed lorazepam  lately. Recognizes still angry and intense too much .   Plan check level and add lithium  300  10/07/21 appt noted; Taboo about lithium  and didn't take it. Irritability is still focused and directed at H and not sure how it would be wihtout him.  Trying to address this with therapy and faith and prayer.   Seeing Benedetto Brady.   Everything fuels my fire.  Erupting this is never gonna change rage.  Recognizes this part is her problem.  Gets upset with him not doing things she wants done. Body getting stronger since Xmas and more she can do the better her depression.   Still some stiffness with pain in spine and leg in AM and then gets better. Not seriously depressed if can move. Don't handle inactivity well.  Able to do more now though has helped. Can't control her reaction to Dee Farber though knows it is over the top since she retired. Intermittent nausea with activity causes her to go to bed.  Waves of pain with it.  Frustrated cause is unknown. Plan: Continue imipramine  150 mg HS for a week and level was WNL at 238. Augment with lithium  CR 300 mg HS for anger and mood  01/22/22 appt noted: Currently take imipramine  100 mg HS and lithium  CR 300 mg HS,  Adderall 10 BID Reduced imipramine  to help tremor and it is some better.  Occ tremor, positional.  Mood is a lot better with lithium .  Right away clearing of haze and calming down.  Better outlook and been like that since then. Questions about effects on organs.   Sleep well.   Not sig anxious now.  Occ moments of anxiety.   Asks if could stop imipramine  bc benefit with lithium .  04/10/22 appt noted: Currently stopped imipramine  100 mg HS (2 days ago) and taking lithium  CR 300 mg HS, Adderall 10 BID Good but have to stop the imipramine  bc of the shaking.  She cut dose to 50 mg QOD. Tremor worse with R hand.  R handed.  Feels like lithium  helped She wants to see what mood is like without imipramine .  Less tremor with less imipramine . Tremor is better with less right now. May need shoulder replacement DT pain.  Also needs spinal fusion DT dropped foot. Hard to talk about it bc so dreads it.   Irritability is there and when ignited is bad but working on it in therapy.    Been angry my whole life.  Back to trauma in childhood.  Overall though better with lithium .   Miralax  helped GI problems but waves of ok and then GI px.   Needs Adderall daily. Takes Ativan  just occ with stressful circumstances. Plan: on imipramine  100 mg HS for a week and level was WNL at 238. She stopped it this  week and disc risk of relapse of dep but she hopes lithium  will keep dep at bay Continue lithium  CR 300 mg HS bc helped anger and mood  06/17/22 appt noted: recently taking 1/2 lithium  300 and imipramine  50 mg HS for 4-5 days and tremor better with less lithium . CO tremor;   irritates her and makes her feel bad about herself.  Never really tried more 10 mg propranolol  for tremor. More pain in shoulder.   Not sure if any change in explosive irritation at H.  Not sure if it is her H or bc of the relationship. Depression is not that bad in the last month and no worse with less imipramine .   Would like to put a  spacer between triggers and what she says to H .  Not good.   When in [place] 2 yr ago for 2 mos alone but still had underlying anger problems. Plan: no med changes  07/14/22 appt noted: Increased lithium  back to 1 and 1/2 tablet for a month.   Till has it in R hand esp.  Uses propranolol  sometimes. Only tried it 4 times. An on impramine 50 mg HS imipramine  100 mg HS for a week and level was WNL at 238. Mood not bad at all  Irritability ?  Better with more lithium .  09/10/22 appt noted: Chronic procrastination unless workig job. Mood is not bad except triggered by H.  No change with increase lithium .  B6 400 BID has reduced and managed tremor. Almost entirely stopped Adderall bc GI sx if not taken with food.  Gut never normal since spleen issue.  Feels something wrong with GI tract.  Cycles of constipation and N and then abd cramping for days at time and then it will be ok for awhile.   Psych meds imipramine  50 mg HS, lithium  300/450 QOD, Adderall 10 BID prn, lorazepam  1 mg prn (none lately, I don't like to be slowed down). Taking miralax  every other day.   Anxiety is not unusuallly high  no panic but some chronic anxiety. Sleep well at night and needs nap if not 8 hours. More postive when on Adderall re: mood and better focused and task completion.    01/01/23 appt noted: Psych meds: Adderall XR 15, cyclobenzaprine 10 mg as needed in the AM for tremor, imipramine  50 mg nightly, lithium  ER 450 mg nightly. (Never increased), lorazepam  rarely She reduced imipramine  to 50 bc she is convinced it is causing the shaking.  Not very much tremor but some postural.   Mood is ok.  I'm happy.   Has Covid.  Day 4-5 of sx, pretty mild.    03/05/23 appt noted: Psych med: as above, imipramine  50 mg HS, lithium  ER 450 mg HS, Adderall XR 15 AM Wonders about ongoing nonstop battle with constipation since colonoscopy.  Also SE dry mouth 3-4 hours.  Level of dep is much better than it was.   Working in therapy with  Benedetto Brady. H Geoff remains target of eruptive anger "If there was a way to calm me down" Chronic constipation from childhood is worse.   Plan: increase lithium  600 mg every day trial for ongoing anger.   If it helps then can try reducing imipramine  Check lithium  level and BMP  05/07/23 appt noted: Psych med: as above, imipramine  50 mg HS, lithium  ER 450 mg HS, Adderall XR 15 AM She increased lithium  to 600 mg daily and didn't notice a change so went back to 450 mg daily  and no change noticed. No SE nor benefit.  Unless tremor related.  When gripping the tremor shows up on R not left .  No resting tremor.   Went on trips and very anxiety provoking and didn't notice than in years past.   Not more anxious after taking Adderall.  Still jumps from one thing to the next.  Poor efficiency at home.   Plan trial buspirone   07/08/23 appt noted: Psych med: as above, imipramine  50 mg HS, lithium  reduced to 300 mg HS, Adderall XR 15 AM daily,  buspirone  15 BID SE : Bothered by tremor.  Is more persistent.  Tremor predated lithium . Mood is good.  Anxiety better when active.  Need to get out of the house to manage it.   Sleep likes to sleep 9-10 hours and can do it consistently. Caffeine 1 daily. Stopped imipramine  and shaking got better.  Have continued the lihtium.   09/22/23  appt noted: Psych med: imipramine  50 mg HS, lithium  ER 450 mg HS, NO Adderall XR 15 AM daily,  restarting buspirone  15 BID Stopped all meds since here.  Was having GI px and having trouble with taking pills.  N would start in the morning.  Had some issues with pills for years without eating even tylenol .   Stopped Adderall and buspirone .  Thinks Adderall was causing some GI Trip triggered worsening CBP and mobility. Hosp with difficulty walking and MRI spinal degeneration.   Gi IS BETTER  WITOUT aDDERALL.   Only px is H.  She gets angry wiith H and overreacts.  Recognizes she is irritable.  Working on it in therapy.  Will  cut him off even before he finishes sentence.   More scattered without Adderall.  Past Psychiatric Medication Trials:  Trintellix NR, sertraline?,  Viibryd  20, remote zoloft, Paxil,  Cymbalta  couple days ? Adverse reaction Imipramine  150 better imipramine  100 mg HS for a week and level was WNL at 238. Fluvoxamine  SE anxiety but ? Adequacy of trial bc stress Fluvoxamine  100 in 2015 per Dr. Harriet Limber  Lithium  300 helpful, 600 not more helpful  Gabapentin  1600 NR Lyrica   Risperidone  1 tremor  Vyvanse, Adderall, concerta   DC propranolol  10-30 mg  twice daily for anxiety bc not helpful, Lorazepam , Xanax  sleepy. 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 Harriet Limber and Terresa Ferry  Started therapy at 49 yo bc fear mother would die any minute bc she had cardiac px.  Review of Systems:  Review of Systems  Cardiovascular:  Negative for chest pain.  Gastrointestinal:  Positive for abdominal pain and nausea.  Musculoskeletal:  Positive for arthralgias, back pain and gait problem.  Neurological:  Positive for dizziness and tremors. Negative for weakness.  Psychiatric/Behavioral:  Positive for dysphoric mood. Negative for agitation, confusion, decreased concentration and hallucinations. The patient is nervous/anxious.     Medications: I have reviewed the patient's current medications.  Current Outpatient Medications  Medication Sig Dispense Refill   albuterol  (VENTOLIN  HFA) 108 (90 Base) MCG/ACT inhaler Inhale 2 puffs into the lungs every 4 (four) hours as needed for wheezing or shortness of breath. 18 g 3   amphetamine -dextroamphetamine  (ADDERALL) 15 MG tablet Take 0.5 tablets by mouth 2 (two) times daily. 30 tablet 0   cyclobenzaprine (FLEXERIL) 10 MG tablet Take 10 mg by mouth as needed.     fluticasone  (FLONASE ) 50 MCG/ACT nasal spray Place into both nostrils daily.     hydroxychloroquine (PLAQUENIL) 200 MG tablet Take 1 tablet by mouth 2 (two) times daily.  levothyroxine (SYNTHROID) 50 MCG  tablet Take 50 mcg by mouth daily before breakfast.     lithium  carbonate (ESKALITH ) 450 MG ER tablet Take 1 tablet (450 mg total) by mouth at bedtime. (Patient taking differently: Take 450 mg by mouth at bedtime. 300 mg daily) 90 tablet 0   LORazepam  (ATIVAN ) 1 MG tablet Take 1 tablet (1 mg total) by mouth every 8 (eight) hours. (Patient taking differently: Take 1 mg by mouth as needed.) 90 tablet 1   omega-3 acid ethyl esters (LOVAZA ) 1 g capsule Take 2 capsules (2 g total) by mouth 2 (two) times daily. 120 capsule 11   ondansetron  (ZOFRAN -ODT) 4 MG disintegrating tablet Take 1 tablet (4 mg total) by mouth every 8 (eight) hours as needed. 20 tablet 0   OXcarbazepine (TRILEPTAL) 150 MG tablet 1 tablet in the morning and 1 and 1/2 tablets in the evening 75 tablet 1   Testosterone  1.62 % GEL Place 5 mg onto the skin daily. 30 g 3   traMADol  (ULTRAM ) 50 MG tablet TAKE 1/2 TO 1 TABLET(25 TO 50 MG) BY MOUTH BACK EVERY 8 HOURS AS NEEDED FOR SEVERE PAIN 90 tablet 0   vitamin E 400 UNIT capsule Take 800 Units by mouth daily.      busPIRone  (BUSPAR ) 15 MG tablet Take 1 tablet (15 mg total) by mouth 2 (two) times daily. (Patient not taking: Reported on 09/22/2023) 180 tablet 0   imipramine  (TOFRANIL ) 50 MG tablet Take 1 tablet (50 mg total) by mouth at bedtime. (Patient not taking: Reported on 09/22/2023) 90 tablet 1   nitroGLYCERIN  (NITROSTAT ) 0.4 MG SL tablet Place 1 tablet (0.4 mg total) under the tongue every 5 (five) minutes as needed for chest pain. (Patient not taking: Reported on 05/25/2023) 25 tablet 2   pravastatin  (PRAVACHOL ) 20 MG tablet Take one tablet by mouth 4 days per week 48 tablet 3   No current facility-administered medications for this visit.    Medication Side Effects: Other: dry mouth  Allergies:  Allergies  Allergen Reactions   Clarithromycin Nausea Only   Covid-19 (Mrna) Vaccine Other (See Comments)    Acute Vasculitis; excessive bleeding  Other reaction(s): Other (See  Comments) Acute Vasculitis; excessive bleeding    Other Other (See Comments)   Bactrim [Sulfamethoxazole-Trimethoprim] Nausea And Vomiting   Morphine Nausea And Vomiting    Other reaction(s): Nausea/Vomiting   Morphine And Codeine Nausea And Vomiting   Azithromycin Nausea And Vomiting and Nausea Only   Ceclor [Cefaclor] Nausea And Vomiting    Can take Augmentin  ok   Claritin [Loratadine]     REACTION: bruises   Codeine Nausea And Vomiting    Can take Tramadol  ok   Cymbalta  [Duloxetine  Hcl]    Erythromycin Nausea And Vomiting    Other reaction(s): Nausea/Vomiting   Erythromycin Base Nausea And Vomiting   Levofloxacin Nausea Only    REACTION: nausea   Lipitor [Atorvastatin ]     Other reaction(s): Myalgias (intolerance)   Nitrofurantoin  Nausea Only   Oxycodone  Other (See Comments)   Penicillins Nausea And Vomiting    Can take Augmentin    Propoxyphene Nausea Only    dizzy   Propoxyphene N-Acetaminophen  Nausea Only    dizzy    Past Medical History:  Diagnosis Date   ADD (attention deficit disorder)    Anxiety    Celiac disease    possible vs IBS   Complication of anesthesia    Coronary artery disease    Depression    no bipolar  per Dr. Harriet Limber   GI problem    [place]   Gluten intolerance    Hyperlipidemia    IBS (irritable bowel syndrome)    Osteoarthritis    Osteoporosis    Pneumonia    PONV (postoperative nausea and vomiting)    Rheumatoid arthritis (HCC)    Seasonal allergies 01/08/2012   hx. of multiple bronchitis related to this.   Shoulder pain, right     Family History  Problem Relation Age of Onset   Heart disease Mother    Heart attack Mother    Prostate cancer Father    Heart disease Sister    Heart disease Maternal Grandmother    Breast cancer Paternal Grandmother    Colon cancer Paternal Grandfather    Coronary artery disease Other        FH Female 1st degree relative <60   ADD / ADHD Other    Other Daughter        gluten intolerance     Social History   Socioeconomic History   Marital status: Married    Spouse name: Not on file   Number of children: 2   Years of education: Not on file   Highest education level: Not on file  Occupational History   Occupation: Network engineer  Tobacco Use   Smoking status: Never   Smokeless tobacco: Never   Tobacco comments:    only Financial trader   Vaping status: Never Used  Substance and Sexual Activity   Alcohol use: Yes    Comment: 0-1 per day   Drug use: No   Sexual activity: Yes  Other Topics Concern   Not on file  Social History Narrative   Married for last 42 years.Lives with husband.Retired 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   Social Drivers of Corporate investment banker Strain: Low Risk  (06/08/2023)   Overall Financial Resource Strain (CARDIA)    Difficulty of Paying Living Expenses: Not hard at all  Food Insecurity: Low Risk  (07/13/2023)   Received from [facility]   Hunger Vital Sign    Worried About Running Out of Food in the Last Year: Never true    Ran Out of Food in the Last Year: Never true  Transportation Needs: No Transportation Needs (07/13/2023)   Received from [facility]    In the past 12 months, has lack of reliable transportation kept you from medical appointments, meetings, work or from getting things needed for daily living? : No  Physical Activity: Sufficiently Active (06/08/2023)   Exercise Vital Sign    Days of Exercise per Week: 4 days    Minutes of Exercise per Session: 120 min  Stress: No Stress Concern Present (06/08/2023)   [employer] of Occupational Health - Occupational Stress Questionnaire    Feeling of Stress : Not at all  Social Connections: Socially Integrated (06/08/2023)   Social Connection and Isolation Panel [NHANES]    Frequency of Communication with Friends and Family: More than three times a week    Frequency of Social Gatherings with Friends and Family: More than  three times a week    Attends Religious Services: More than 4 times per year    Active Member of [employer] or Organizations: Yes    Attends Banker Meetings: More than 4 times per year    Marital Status: Married  Catering manager Violence: Not At Risk (06/08/2023)   Humiliation, Afraid, Rape, and Kick questionnaire  Fear of Current or Ex-Partner: No    Emotionally Abused: No    Physically Abused: No    Sexually Abused: No    Past Medical History, Surgical history, Social history, and Family history were reviewed and updated as appropriate.   Please see review of systems for further details on the patient's review from today.   Objective:   Physical Exam:  LMP 01/08/1999   Physical Exam Constitutional:      General: She is not in acute distress. Musculoskeletal:        General: No deformity.  Neurological:     Mental Status: She is alert and oriented to person, place, and time.     Cranial Nerves: No dysarthria.     Coordination: Coordination normal.  Psychiatric:        Attention and Perception: Attention and perception normal. She does not perceive auditory or visual hallucinations.        Mood and Affect: Mood is anxious. Mood is not depressed. Affect is not labile, blunt, angry, tearful or inappropriate.        Speech: Speech normal. Speech is not rapid and pressured.        Behavior: Behavior normal. Behavior is cooperative.        Thought Content: Thought content normal. Thought content is not paranoid or delusional. Thought content does not include homicidal or suicidal ideation. Thought content does not include suicidal plan.        Cognition and Memory: Cognition and memory normal.        Judgment: Judgment normal.     Comments: Insight intact Anger at home.  Not in office Frustrated      Lab Review:     Component Value Date/Time   NA 135 06/29/2023 1443   NA 134 06/14/2018 1444   K 4.7 06/29/2023 1443   CL 104 06/29/2023 1443   CO2 23 06/29/2023  1443   GLUCOSE 92 06/29/2023 1443   BUN 21 06/29/2023 1443   BUN 10 06/14/2018 1444   CREATININE 0.88 06/29/2023 1443   CALCIUM  9.5 06/29/2023 1443   PROT 6.6 08/27/2022 0940   PROT 6.5 12/19/2020 0754   ALBUMIN 4.0 08/27/2022 0940   ALBUMIN 4.5 12/19/2020 0754   AST 23 08/27/2022 0940   ALT 17 08/27/2022 0940   ALKPHOS 53 08/27/2022 0940   BILITOT 0.3 08/27/2022 0940   BILITOT 0.4 12/19/2020 0754   GFRNONAA 60 (L) 04/17/2021 1825   GFRNONAA 79 04/10/2020 1431   GFRAA 91 04/10/2020 1431       Component Value Date/Time   WBC 6.2 08/27/2022 0940   RBC 4.36 08/27/2022 0940   HGB 13.8 08/27/2022 0940   HCT 41.6 08/27/2022 0940   PLT 350.0 08/27/2022 0940   MCV 95.5 08/27/2022 0940   MCH 31.4 04/17/2021 1825   MCHC 33.0 08/27/2022 0940   RDW 13.7 08/27/2022 0940   LYMPHSABS 1.4 08/27/2022 0940   MONOABS 0.5 08/27/2022 0940   EOSABS 1.0 (H) 08/27/2022 0940   BASOSABS 0.1 08/27/2022 0940    Lithium  Lvl  Date Value Ref Range Status  06/29/2023 0.6 0.6 - 1.2 mmol/L Final     No results found for: "PHENYTOIN", "PHENOBARB", "VALPROATE", "CBMZ"   06/04/20 B12 normal at 496  08/16/21   Latest Reference Range & Units 08/16/21 08:29  Desipramine  mcg/L 128  Imipramine  Lvl mcg/L 110   01/21/2021 imipramine  level 25, desipramine  level 40 equals total 65 which is low on 75 mg daily. 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 They have one copy of the C allele and one copy of the G allele. This genotype suggests a normal response to certain ADHD medications.  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 This genotype suggests a lower risk of serious hypersensitivity reactions, including Stevens-Johnson  syndrome (SJS), toxic epidermal necrolysis (TEN), maculopapular eruptions, and Drug Reaction with Eosinophilia and Systemic Symptoms when taking certain mood stabilizers.  HLA-B*1502Lower Risk Not Present This patient does not carry the HLA-B*1502 allele or a closely related *15 allele. Absence of HLA-B*1502 and the closely related *15 alleles suggests lower risk of serious dermatologic reactions including toxic epidermal necrolysis (TEN) and Stevens-Johnson syndrome (SJS) when taking certain mood stabilizers.  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 They carry two copies of the G allele. This genotype has been associated with an increased risk of adverse drug reactions with certain selective serotonin reuptake inhibitors.  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 The short promoter allele is reported to decrease expression of the serotonin transporter compared to the homozygous long promoter allele. The patient may have a moderately decreased likelihood of response to certain selective serotonin reuptake inhibitors due to the presence of the short form of the gene.  Pharmacokinetic Genes PK CES1A1Extensive (Normal) Metabolizer GLY/GLY CES1A1 - Gly allele enzyme activity: Normal CES1A1 - Gly allele enzyme activity: Normal  This genotype is most consistent with the extensive (normal) metabolizer phenotype.  The patient is expected to have normal enzyme activity.  CYP1A2Extensive (Normal) Metabolizer *1/*1 This genotype is most consistent with the extensive (normal) metabolizer phenotype.  CYP2B6Poor Metabolizer *6/*6 CYP2B6*6 allele enzyme activity: Reduced CYP2B6*6 allele enzyme activity: Reduced  This genotype is most consistent with the poor metabolizer phenotype. This patient  may have reduced enzyme activity as compared to individuals with the normal phenotype.  CYP2C19Extensive (Normal) Metabolizer *1/*1 CYP2C19*1 allele enzyme activity: Normal CYP2C19*1 allele enzyme activity: Normal  This genotype is most consistent with the extensive (normal) metabolizer phenotype.  CYP2C9Intermediate Metabolizer *1/*2 CYP2C9*1 allele enzyme activity: Normal CYP2C9*2 allele enzyme activity: Reduced  This genotype is most consistent with the intermediate metabolizer phenotype. This patient may have reduced enzyme activity as compared to individuals with the normal phenotype.  CYP2D6Extensive (Normal) Metabolizer *1/*41 CYP2D6*1 allele enzyme activity: Normal CYP2D6*41 allele enzyme activity: Reduced  This genotype is most consistent with the extensive (normal) metabolizer phenotype.  CYP3A4Poor Metabolizer *22/*22 CYP3A4*22 allele enzyme activity: Reduced CYP3A4*22 allele enzyme activity: Reduced  This genotype is most consistent with the poor metabolizer phenotype. This patient may have reduced enzyme activity as compared to individuals with the normal phenotype.  UGT1A4Ultrarapid Metabolizer *1/*3 UGT1A4*1 allele enzyme activity: Normal UGT1A4*3 allele enzyme activity: Increased  This genotype is most consistent with the ultrarapid metabolizer phenotype. This patient may have increased enzyme activity as compared to individuals with the normal phenotype.  UGT2B15Intermediate Metabolizer *2/*2 UGT2B15*2 allele enzyme activity: Reduced UGT2B15*2 allele enzyme activity: Reduced  This genotype is most consistent with the intermediate metabolizer phenotype. This patient may have reduced enzyme activity as compared to individuals with the normal phenotype. .res Assessment: Plan:    Laurian was seen today for follow-up, depression, anxiety  and medication problem.  Diagnoses and all orders for this visit:  Major depressive disorder, recurrent episode,  moderate (HCC) -     OXcarbazepine (TRILEPTAL) 150 MG tablet; 1 tablet in the morning and 1 and 1/2 tablets in the evening  Generalized anxiety disorder -     OXcarbazepine (TRILEPTAL) 150 MG tablet; 1 tablet in the morning and 1 and 1/2 tablets in the evening  Attention deficit hyperactivity disorder (ADHD), combined type -     amphetamine -dextroamphetamine  (ADDERALL) 15 MG tablet; Take 0.5 tablets by mouth 2 (two) times daily.  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 We discussed    Overall depression is better and anxiety was better too but not gone and worse without lithium .   Overall more irritable than depressed but just with H.  Some chronic anxiety but not severe now.  Rec she not change meds on her own.  This has been an issue  She stopped imipramine  DT tremor and hangover  Drop lithium  back to ER 450 bc no benefit noted with 600 mg daily Counseled patient regarding potential benefits, risks, and side effects of lithium  to include potential risk of lithium  affecting thyroid  and renal function.  Discussed need for periodic lab monitoring to determine drug level and to assess for potential adverse effects.  Counseled patient regarding signs and symptoms of lithium  toxicity and advised that they notify office immediately or seek urgent medical attention if experiencing these signs and symptoms.  Patient advised to contact office with any questions or concerns. Esp disc DDI with NSAIDs.   B12 level checked 496 and normal.  Disc pros and cons of resuming Adderall.  She wants to resume   Not currently using , Ativan  1/2 mg tid prn since she is tired from it and not on Adderall usually.    Stop buspirone  Start Trileptal (oxcarbazepine) 150 mg tablet at night for 1 week,  then 1 tablet at night and 1/2 tablet in the morning and 1 tablet at night for 1 week,  Then  increase to 1 tablet in the morning and 1 and 1/2 tablets at night  Consider alternatives for anger outbursts, VPA, atypical.  Supportive therapy around marital crisis and other stressors.  She needs some goals.  Needs to get out of the house chronically.  Started therapy with Benedetto Brady.  FU 8-12 weeks  Nori Beat, MD, DFAPA   Please see After Visit Summary for patient specific instructions.  Future Appointments  Date Time Provider Department Center  11/03/2023  2:45 PM Helmus, Jitka, PT [facility] None  06/08/2024  9:30 AM [facility]-ANNUAL WELLNESS VISIT [facility] None     No orders of the defined types were placed in this encounter.     -------------------------------

## 2023-09-22 NOTE — Patient Instructions (Addendum)
 Stop buspirone  Start Trileptal (oxcarbazepine) 150 mg tablet at night for 1 week,  then 1 tablet at night and 1/2 tablet in the morning and 1 tablet at night for 1 week,  Then increase to 1 tablet in the morning and 1 and 1/2 tablets at night

## 2023-09-23 DIAGNOSIS — M545 Low back pain, unspecified: Secondary | ICD-10-CM | POA: Diagnosis not present

## 2023-09-23 DIAGNOSIS — M4316 Spondylolisthesis, lumbar region: Secondary | ICD-10-CM | POA: Diagnosis not present

## 2023-09-24 DIAGNOSIS — F411 Generalized anxiety disorder: Secondary | ICD-10-CM | POA: Diagnosis not present

## 2023-09-25 DIAGNOSIS — M4316 Spondylolisthesis, lumbar region: Secondary | ICD-10-CM | POA: Diagnosis not present

## 2023-09-25 DIAGNOSIS — M545 Low back pain, unspecified: Secondary | ICD-10-CM | POA: Diagnosis not present

## 2023-10-01 DIAGNOSIS — L821 Other seborrheic keratosis: Secondary | ICD-10-CM | POA: Diagnosis not present

## 2023-10-01 DIAGNOSIS — Z7189 Other specified counseling: Secondary | ICD-10-CM | POA: Diagnosis not present

## 2023-10-01 DIAGNOSIS — L718 Other rosacea: Secondary | ICD-10-CM | POA: Diagnosis not present

## 2023-10-01 DIAGNOSIS — L570 Actinic keratosis: Secondary | ICD-10-CM | POA: Diagnosis not present

## 2023-10-01 DIAGNOSIS — D485 Neoplasm of uncertain behavior of skin: Secondary | ICD-10-CM | POA: Diagnosis not present

## 2023-10-01 DIAGNOSIS — D225 Melanocytic nevi of trunk: Secondary | ICD-10-CM | POA: Diagnosis not present

## 2023-10-01 DIAGNOSIS — L814 Other melanin hyperpigmentation: Secondary | ICD-10-CM | POA: Diagnosis not present

## 2023-10-06 ENCOUNTER — Encounter: Payer: Self-pay | Admitting: Internal Medicine

## 2023-10-06 DIAGNOSIS — M545 Low back pain, unspecified: Secondary | ICD-10-CM | POA: Diagnosis not present

## 2023-10-06 DIAGNOSIS — M4316 Spondylolisthesis, lumbar region: Secondary | ICD-10-CM | POA: Diagnosis not present

## 2023-10-08 DIAGNOSIS — M4316 Spondylolisthesis, lumbar region: Secondary | ICD-10-CM | POA: Diagnosis not present

## 2023-10-08 DIAGNOSIS — M545 Low back pain, unspecified: Secondary | ICD-10-CM | POA: Diagnosis not present

## 2023-10-12 ENCOUNTER — Telehealth: Payer: Self-pay | Admitting: Psychiatry

## 2023-10-12 MED ORDER — LITHIUM CARBONATE 300 MG PO TABS: 300.0000 mg | ORAL_TABLET | Freq: Every day | ORAL | 0 refills | Status: DC

## 2023-10-12 NOTE — Telephone Encounter (Signed)
 Ok per her request to reduce lithium  to 300 mg cap, 1 HS, #90.  No refill and DC 450 mg dose.

## 2023-10-12 NOTE — Telephone Encounter (Signed)
 On 5/27 visit:  Drop lithium  back to ER 450 bc no benefit noted with 600 mg daily   Stop buspirone  Start Trileptal  (oxcarbazepine ) 150 mg tablet at night for 1 week,  then 1 tablet at night and 1/2 tablet in the morning and 1 tablet at night for 1 week,  Then increase to 1 tablet in the morning and 1 and 1/2 tablets at night  She said she had a conversation about dropping all her meds back due to the addition of the Trileptal .   She is asking for Rx for 300 mg and not 450 mg. Will send as appropriate.

## 2023-10-12 NOTE — Telephone Encounter (Signed)
 Rx for lithium  300 mg at bedtime, #90, sent to [facility] in [place]. Patient aware.

## 2023-10-12 NOTE — Telephone Encounter (Signed)
 Pt called asking for a refill on her lithium  carbonate 300 mg. She did not pick up 450 mg. Pharmacy is [facility] in [place]

## 2023-10-12 NOTE — Addendum Note (Signed)
 Addended by: Geno Kida T on: 10/12/2023 05:11 PM   Modules accepted: Orders

## 2023-10-13 ENCOUNTER — Telehealth: Payer: Self-pay

## 2023-10-13 DIAGNOSIS — F331 Major depressive disorder, recurrent, moderate: Secondary | ICD-10-CM

## 2023-10-13 DIAGNOSIS — F411 Generalized anxiety disorder: Secondary | ICD-10-CM

## 2023-10-13 NOTE — Telephone Encounter (Signed)
 Pt was seen on 5/27 with the following changes:   Start Trileptal  (oxcarbazepine ) 150 mg tablet at night for 1 week,  then 1 tablet at night and 1/2 tablet in the morning and 1 tablet at night for 1 week,  Then increase to 1 tablet in the morning and 1 and 1/2 tablets at night  She is reporting she is about 25% better. I asked what dosage she was on and she reported she started at 1 QAM and 1-1/2 QPM, did not do the titration as directed.  The directions on the bottle are how she has been taking.

## 2023-10-13 NOTE — Telephone Encounter (Signed)
 Sent recommendations via MyChart.

## 2023-10-13 NOTE — Telephone Encounter (Signed)
 Ok.  Increase  oxcarbazepine  to 1 tablet in the morning and 2 tablets at night for 1 week, then 1 and 1/2 tablets in the morning and 2 and 1/2 tablets at night.  We can go much higher if needed and tolerated.

## 2023-10-14 NOTE — Telephone Encounter (Signed)
 Called patient to make sure she had gotten the instructions via MyChart and she had. She said she needs a RF d/t dose increase.

## 2023-10-15 ENCOUNTER — Telehealth: Payer: Self-pay | Admitting: Psychiatry

## 2023-10-15 MED ORDER — OXCARBAZEPINE 150 MG PO TABS: ORAL_TABLET | ORAL | 0 refills | Status: DC

## 2023-10-15 NOTE — Telephone Encounter (Deleted)
 Patient called into office stating that she is at pharmacy and her prescription for Oxbarabaxepine 150mg  was not sent over. Pls send prescription to [facility]  [address], [place]

## 2023-10-15 NOTE — Telephone Encounter (Signed)
 New Rx sent.

## 2023-10-15 NOTE — Telephone Encounter (Signed)
 error

## 2023-10-22 DIAGNOSIS — F411 Generalized anxiety disorder: Secondary | ICD-10-CM | POA: Diagnosis not present

## 2023-10-28 DIAGNOSIS — M48062 Spinal stenosis, lumbar region with neurogenic claudication: Secondary | ICD-10-CM | POA: Diagnosis not present

## 2023-10-29 ENCOUNTER — Telehealth: Payer: Self-pay | Admitting: Psychiatry

## 2023-10-29 ENCOUNTER — Other Ambulatory Visit: Payer: Self-pay | Admitting: Psychiatry

## 2023-10-29 DIAGNOSIS — F331 Major depressive disorder, recurrent, moderate: Secondary | ICD-10-CM

## 2023-10-29 DIAGNOSIS — F411 Generalized anxiety disorder: Secondary | ICD-10-CM | POA: Diagnosis not present

## 2023-10-29 MED ORDER — OXCARBAZEPINE 300 MG PO TABS: ORAL_TABLET | ORAL | 0 refills | Status: DC

## 2023-10-29 NOTE — Telephone Encounter (Signed)
 On 6/17:    Increase  oxcarbazepine  to 1 tablet in the morning and 2 tablets at night for 1 week, then 1 and 1/2 tablets in the morning and 2 and 1/2 tablets at night.  We can go much higher if needed and tolerated.   She reports she hasn't noticed any difference since increasing dose, has not had any SE.

## 2023-10-29 NOTE — Telephone Encounter (Signed)
 We can go higher in the dose.  I will send in new rX of 300 mg tablets, 1 tablet in the morning and 1 and 1/2 tablets at night.  ( For her info, the average dose in women is 1 in the AM and 2 tablets at night).

## 2023-10-29 NOTE — Telephone Encounter (Signed)
 Pt lvm that the new medicine that dr. Geoffry increased is not working. She said that she does not notice any change at all. She wants to know if he is going to change medicine. Please call her at [phone]

## 2023-11-02 NOTE — Therapy (Signed)
 OUTPATIENT PHYSICAL THERAPY FEMALE PELVIC EVALUATION   Patient Name: Courtney [name] MRN: [id number] DOB:[birth year], 73 y.o., female Today's Date: 11/03/2023  END OF SESSION:  PT End of Session - 11/03/23 1745     Visit Number 1    Authorization Type Medicare, BCBS Federal    PT Start Time 1445    PT Stop Time 1548    PT Time Calculation (min) 63 min    Activity Tolerance Patient tolerated treatment well    Behavior During Therapy WFL for tasks assessed/performed;Agitated          Past Medical History:  Diagnosis Date   ADD (attention deficit disorder)    Anxiety    Celiac disease    possible vs IBS   Complication of anesthesia    Coronary artery disease    Depression    no bipolar per Dr. Blinda   GI problem    Obie   Gluten intolerance    Hyperlipidemia    IBS (irritable bowel syndrome)    Osteoarthritis    Osteoporosis    Pneumonia    PONV (postoperative nausea and vomiting)    Rheumatoid arthritis (HCC)    Seasonal allergies 01/08/2012   hx. of multiple bronchitis related to this.   Shoulder pain, right    Past Surgical History:  Procedure Laterality Date   EXPLORATORY LAPAROTOMY  01/08/2012   due to endometriosis-portions of both ovaries removed   ROTATOR CUFF REPAIR Right    TONSILLECTOMY     TOTAL HIP ARTHROPLASTY Right    right. 2002   TOTAL HIP ARTHROPLASTY  01/14/2012   Procedure: TOTAL HIP ARTHROPLASTY;  Surgeon: Courtney LULLA Moan, MD;  Location: [facility];  Service: Orthopedics;  Laterality: Left;   TOTAL KNEE ARTHROPLASTY Left 07/16/2020   Procedure: TOTAL KNEE ARTHROPLASTY;  Surgeon: [name] Dempsey, MD;  Location: [facility];  Service: Orthopedics;  Laterality: Left;    VENTRAL HERNIA REPAIR  01/08/2012   abdominal   Patient Active Problem List   Diagnosis Date Noted   Cervical myelopathy (HCC) 06/11/2022   Foot-drop 06/11/2022   Neck pain 06/11/2022   Primary localized osteoarthrosis of multiple sites 06/11/2022   Asthmatic  bronchitis 08/20/2021   Allergic rhinitis 08/20/2021   Acute sinusitis 06/27/2021   Multiple drug allergies 06/27/2021   Hematoma of spleen, closed, subsequent encounter 04/30/2021   Stress at home 04/30/2021   Hyponatremia 03/06/2021   Menopausal disorder 03/06/2021   Nausea & vomiting 02/27/2021   Generalized abdominal pain 02/27/2021   Primary osteoarthritis of left knee 07/16/2020   Vasculitis (HCC) 07/04/2020   RA (rheumatoid arthritis) (HCC) 07/04/2020   Preop exam for internal medicine 06/03/2020   Menopause 05/31/2020   Allergic contact dermatitis 05/28/2020   Osteopenia 03/29/2020   Degenerative disc disease, cervical 06/15/2019   Acquired leg length discrepancy 06/15/2019   Leg pain 06/14/2019   Toe ulcer (HCC) 06/14/2019   Mixed hyperlipidemia 02/04/2019   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   Trigger finger of left thumb 03/09/2017   Spinal stenosis, lumbar region, 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   Sciatica of right side associated with disorder of lumbar spine 02/26/2017   Carpal tunnel syndrome, right 01/12/2017   CAD in native artery 12/23/2016   Tick bite 09/09/2016   Back pain with right-sided radiculopathy 06/26/2016   Complete tear of right rotator cuff 06/26/2016   Glenohumeral arthritis, right 06/26/2016   Acquired scoliosis 12/13/2015   Abdominal pain, epigastric 05/09/2015   Dry skin dermatitis 08/04/2012   Constipation  08/04/2012   History of hypothyroidism 08/04/2012   Otitis media, serous 12/25/2010   OSTEOARTHRITIS, HIPS, BILATERAL 06/20/2010   Anxiety disorder 05/03/2009   TOBACCO USE, QUIT 05/03/2009   ONYCHOMYCOSIS 06/28/2008   Actinic keratosis 06/28/2008   Bilateral carotid bruits 06/28/2008   Attention deficit disorder 05/17/2007   Irritable bowel syndrome 05/17/2007   Dyslipidemia 02/28/2007   Depression 02/28/2007   OA (osteoarthritis) of knee 02/28/2007    PCP: Plotnikov, Courtney GAILS, MD  REFERRING PROVIDER:  Mat Browning, MD  REFERRING DIAG: R35.0 (ICD-10-CM) - Frequency of micturition  THERAPY DIAG:  Other lack of coordination  Other low back pain  Muscle weakness (generalized)  Rationale for Evaluation and Treatment: Rehabilitation  ONSET DATE: 2024  SUBJECTIVE:                                                                                                                                                                                           SUBJECTIVE STATEMENT: Pt reports that she can't hold her urine in the middle of the night. Can't hold the urine from the bed to the toilet.  During the day she is ok, no leaking.  Not aware of any recent changes Pt reports that she has drop foot and sciatica, chronic back pain and has ortho PT on speed [facility] Courtney [name] ) Pt reports that she has not done any exercises, Kegels in years.  Fluid intake: water - 2x24 oz/ day  PAIN: in her spine- she is losing cartilage in L4, L5 and S1 Are you having pain? Yes NPRS scale: 8/10  PRECAUTIONS: None  RED FLAGS: None   WEIGHT BEARING RESTRICTIONS: No  FALLS:  Has patient fallen in last 6 months? No  OCCUPATION: retired  ACTIVITY LEVEL : active, does not sit, hurts to sit, moves all the time, 3-4 times/ week in the gym, walks in the pool backwards because she was told it strengthens her spine  PLOF: Independent  PATIENT GOALS: to stay dry  PERTINENT HISTORY:   Sexual abuse: No  BOWEL MOVEMENT: yes- had a colonoscopy 2.5 years ago hemorrhaged , was hospitalized 2x since then, has not had normal bowel movements since then Pain with bowel movement: No Type of bowel movement:Type (Bristol Stool Scale) 1-7 depends on  if she takes Miralax  Fully empty rectum: Yes:   Leakage: No Pads: No Fiber supplement/laxative Yes Miralax   URINATION: Pain with urination: No Fully empty bladder: Yes: takes a while Stream: Strong Urgency: No Frequency: depends on how much she drinks Leakage:  Walking to the bathroom at night, standing up from sitting, when she drinks too much  Pads: No  INTERCOURSE:  Ability to have vaginal penetration Yes  Pain with intercourse: Initial Penetration and During Penetration- cannot imagine pain free intercourse- she reports that she shrunk and  DrynessYes  Climax: no Marinoff Scale: 3/3 Laxative: Miralax   PREGNANCY: Vaginal deliveries 2 Tearing Yes:   Episiotomy Yes  C-section deliveries 0 Currently pregnant No  PROLAPSE: None   OBJECTIVE:  Note: Objective measures were completed at Evaluation unless otherwise noted.   PATIENT SURVEYS:    PFIQ-7: to be filled later d/ time  COGNITION: Overall cognitive status: Within functional limits for tasks assessed     SENSATION: Light touch: Appears intact  LUMBAR SPECIAL TESTS:  Straight leg raise test: Positive for hypermobility  GAIT: Assistive device utilized: None Comments: slightly antalgic  POSTURE: rounded shoulders, forward head, and right pelvic obliquity   LUMBARAROM/PROM: hinges and keep lumbar spine flat  LOWER EXTREMITY ROM: hypermobile throughout hips  LOWER EXTREMITY MMT: 4/5  PALPATION:    General: some tightness in vertical abdominal scar, lower abdominal weakness, hypermobility throughout her upper and lower extremities and trunk  Pelvic Alignment: uneven due to  scoliosis  Abdominal: lower abdominal weakness, with abdominal contraction putting pressure on bladder and has difficulty with activation                External Perineal Exam: mild dryness present                             Internal Pelvic Floor: bulges pelvic floor with contraction, mild dryness  Patient confirms identification and approves PT to assess internal pelvic floor and treatment Yes Patient confirms identification and approves PT to assess internal pelvic floor and treatment Yes No emotional/communication barriers or cognitive limitation. Patient is motivated to learn. Patient  understands and agrees with treatment goals and plan. PT explains patient will be examined in standing, sitting, and lying down to see how their muscles and joints work. When they are ready, they will be asked to remove their underwear so PT can examine their perineum. The patient is also given the option of providing their own chaperone as one is not provided in our facility. The patient also has the right and is explained the right to defer or refuse any part of the evaluation or treatment including the internal exam. With the patient's consent, PT will use one gloved finger to gently assess the muscles of the pelvic floor, seeing how well it contracts and relaxes and if there is muscle symmetry. After, the patient will get dressed and PT and patient will discuss exam findings and plan of care. PT and patient discuss plan of care, schedule, attendance policy and HEP activities.     PELVIC MMT:   MMT eval  Vaginal 2/5. 5 reps  Internal Anal Sphincter   External Anal Sphincter   Puborectalis   Diastasis Recti 1 finger  (Blank rows = not tested)        TONE: low  PROLAPSE: Anterior vaginal wall laxity  TODAY'S TREATMENT:  DATE: 11/03/23  EVAL EVAL Examination completed, findings reviewed, pt educated on POC, HEP, and female pelvic floor anatomy, reasoning with pelvic floor assessment internally with pt consent. Pt motivated to participate in PT and agreeable to attempt recommendations.     PATIENT EDUCATION/ there acts Education details: Pt was educated on relevant anatomy, exam findings, HEP, expectations of PT   Person educated: Patient Education method: Explanation, Demonstration, Tactile cues, and Verbal cues Education comprehension: verbalized understanding, returned demonstration, verbal cues required, tactile cues required, and needs further education Transverse  abdominis breath with ball press- difficult HOME EXERCISE PROGRAM: To be developed- to include pressure management strategies  ASSESSMENT:  CLINICAL IMPRESSION: Patient is a 73 y.o. F who was seen today for physical therapy evaluation and treatment for urinary incontinence. Pt with low back chronic pain, constipation and dyspareunia, seems very frustrated. Reported that she has chronic sciatica,  was told her whole life to brace her core, she puts pressure on her bladder with bracing of her upper abdominals. With internal exam findings notable for pelvic floor weakness, anterior vaginal wall laxity and decreased coordination and awareness. Pt only leaks at night after she has a lot of water , she wakes up between 1-3 and does not make it to the bathroom. Discussed reduced water  intake at least 3 hrs before bedtime. Reported dyspareunia d/t dryness, vaginal moisturizer samples given. Pt has many orthopedic issues- left wrist weakness after an injury 50 years ago or more, right shoulder limited AROM and chronic low back pain. Pt in constant need of moving her body to have reduced pain.  Pt will benefit from PT to address deficits   OBJECTIVE IMPAIRMENTS: decreased activity tolerance, decreased coordination, decreased knowledge of condition, difficulty walking, decreased strength, impaired tone, and pain.   ACTIVITY LIMITATIONS: bending, sleeping, continence, and toileting  PARTICIPATION LIMITATIONS: interpersonal relationship and community activity  PERSONAL FACTORS: Age, Behavior pattern, and Time since onset of injury/illness/exacerbation are also affecting patient's functional outcome.   REHAB POTENTIAL: Good  CLINICAL DECISION MAKING: Evolving/moderate complexity  EVALUATION COMPLEXITY: Moderate   GOALS: Goals reviewed with patient? Yes  SHORT TERM GOALS: Target date: 12/01/2023     Pt will be I and consistent with her HEP and demonstrate all exercises correctly  Baseline: Goal  status: INITIAL  2.  Pt will be educated on healthy bladder strategies Baseline:  Goal status: INITIAL  3.  Pt will be educated on urge suppression strategies and the knack Baseline:  Goal status: INITIAL  4.  Pt will be educate on constipation strategies and abdominal massage Baseline:  Goal status: INITIAL   LONG TERM GOALS: Target date: 01/26/2024    Patient able to engage her lower abdominal equally with the upper to reduce pressure on her pelvic floor.   Baseline:  Goal status: INITIAL  2.  Pt will not leak when walking to the bathroom at night.  Baseline:  Goal status: INITIAL  3.  Pt will be I with urge suppression strategies and the knack Baseline:  Goal status: INITIAL  4.  Pt will demonstrate at least 3/5 pelvic floor strength to reduce leaking Baseline:  Goal status: INITIAL  5.  Pt will report improving number of bowel movements to at least 5/ week Baseline:  Goal status: INITIAL  6.  Pt will report complete emptying of rectum Baseline:  Goal status: INITIAL  PLAN:  PT FREQUENCY: 1-2x/week  PT DURATION: 12 weeks  PLANNED INTERVENTIONS: 97110-Therapeutic exercises, 97530- Therapeutic activity, [id number]- Neuromuscular re-education, 97535- Self Care,  02859- Manual therapy, [id number]- Electrical stimulation (manual), 79439 (1-2 muscles), 20561 (3+ muscles)- Dry Needling, Patient/Family education, Taping, Joint mobilization, Joint manipulation, Spinal manipulation, Spinal mobilization, Scar mobilization, Cryotherapy, Moist heat, and Biofeedback  PLAN FOR NEXT SESSION: constipation strategies, pressure management exercises, urge suppression, healthy bladder education    Morghan Kester, PT 11/03/2023, 5:46 PM

## 2023-11-02 NOTE — Telephone Encounter (Signed)
 Called patient to review recommendations for new Trileptal  dosing.  Rx was sent for 300 mg, 1 QAM and 2 QPM.  Wanted to clarify dosing.

## 2023-11-03 ENCOUNTER — Other Ambulatory Visit: Payer: Self-pay

## 2023-11-03 ENCOUNTER — Encounter: Payer: Self-pay | Admitting: Physical Therapy

## 2023-11-03 ENCOUNTER — Ambulatory Visit: Attending: Obstetrics and Gynecology | Admitting: Physical Therapy

## 2023-11-03 DIAGNOSIS — M6281 Muscle weakness (generalized): Secondary | ICD-10-CM | POA: Insufficient documentation

## 2023-11-03 DIAGNOSIS — M5459 Other low back pain: Secondary | ICD-10-CM | POA: Insufficient documentation

## 2023-11-03 DIAGNOSIS — R278 Other lack of coordination: Secondary | ICD-10-CM | POA: Diagnosis not present

## 2023-11-12 DIAGNOSIS — F411 Generalized anxiety disorder: Secondary | ICD-10-CM | POA: Diagnosis not present

## 2023-11-19 ENCOUNTER — Other Ambulatory Visit: Payer: Self-pay

## 2023-11-19 ENCOUNTER — Telehealth: Payer: Self-pay | Admitting: Psychiatry

## 2023-11-19 DIAGNOSIS — F411 Generalized anxiety disorder: Secondary | ICD-10-CM | POA: Diagnosis not present

## 2023-11-19 DIAGNOSIS — F902 Attention-deficit hyperactivity disorder, combined type: Secondary | ICD-10-CM

## 2023-11-19 DIAGNOSIS — F331 Major depressive disorder, recurrent, moderate: Secondary | ICD-10-CM

## 2023-11-19 MED ORDER — OXCARBAZEPINE 300 MG PO TABS
ORAL_TABLET | ORAL | 0 refills | Status: DC
Start: 2023-11-19 — End: 2023-11-30

## 2023-11-19 NOTE — Telephone Encounter (Signed)
 Patient called in for refill on Oxcarbazepine  150mg  and Adderall 15mg . Ph: [phone] Appt 8/4 Pharmacy [facility] [address] [place]

## 2023-11-19 NOTE — Telephone Encounter (Signed)
 Pended Adderall and sent Trileptal .

## 2023-11-20 MED ORDER — AMPHETAMINE-DEXTROAMPHETAMINE 15 MG PO TABS: 7.5000 mg | ORAL_TABLET | Freq: Two times a day (BID) | ORAL | 0 refills | Status: DC

## 2023-11-23 ENCOUNTER — Ambulatory Visit: Admitting: Physical Therapy

## 2023-11-23 ENCOUNTER — Encounter: Payer: Self-pay | Admitting: Physical Therapy

## 2023-11-23 DIAGNOSIS — M5459 Other low back pain: Secondary | ICD-10-CM | POA: Diagnosis not present

## 2023-11-23 DIAGNOSIS — R278 Other lack of coordination: Secondary | ICD-10-CM | POA: Diagnosis not present

## 2023-11-23 DIAGNOSIS — M6281 Muscle weakness (generalized): Secondary | ICD-10-CM | POA: Diagnosis not present

## 2023-11-23 NOTE — Therapy (Addendum)
 OUTPATIENT PHYSICAL THERAPY FEMALE PELVIC TREATMENT   Patient Name: Myrle Wanek MRN: [id number] DOB:1950/06/18, 73 y.o., female Today's Date: 11/23/2023  END OF SESSION:  PT End of Session - 11/23/23 0858     Visit Number 2    Authorization Type Medicare, BCBS Federal    PT Start Time [phone]    PT Stop Time 0930    PT Time Calculation (min) 33 min    Activity Tolerance Patient tolerated treatment well    Behavior During Therapy WFL for tasks assessed/performed;Agitated          Past Medical History:  Diagnosis Date   ADD (attention deficit disorder)    Anxiety    Celiac disease    possible vs IBS   Complication of anesthesia    Coronary artery disease    Depression    no bipolar per Dr. Blinda   GI problem    Obie   Gluten intolerance    Hyperlipidemia    IBS (irritable bowel syndrome)    Osteoarthritis    Osteoporosis    Pneumonia    PONV (postoperative nausea and vomiting)    Rheumatoid arthritis (HCC)    Seasonal allergies 01/08/2012   hx. of multiple bronchitis related to this.   Shoulder pain, right    Past Surgical History:  Procedure Laterality Date   EXPLORATORY LAPAROTOMY  01/08/2012   due to endometriosis-portions of both ovaries removed   ROTATOR CUFF REPAIR Right    TONSILLECTOMY     TOTAL HIP ARTHROPLASTY Right    right. 2002   TOTAL HIP ARTHROPLASTY  01/14/2012   Procedure: TOTAL HIP ARTHROPLASTY;  Surgeon: Dempsey LULLA Moan, MD;  Location: [facility];  Service: Orthopedics;  Laterality: Left;   TOTAL KNEE ARTHROPLASTY Left 07/16/2020   Procedure: TOTAL KNEE ARTHROPLASTY;  Surgeon: Moan Dempsey, MD;  Location: [facility];  Service: Orthopedics;  Laterality: Left;    VENTRAL HERNIA REPAIR  01/08/2012   abdominal   Patient Active Problem List   Diagnosis Date Noted   Cervical myelopathy (HCC) 06/11/2022   Foot-drop 06/11/2022   Neck pain 06/11/2022   Primary localized osteoarthrosis of multiple sites 06/11/2022   Asthmatic  bronchitis 08/20/2021   Allergic rhinitis 08/20/2021   Acute sinusitis 06/27/2021   Multiple drug allergies 06/27/2021   Hematoma of spleen, closed, subsequent encounter 04/30/2021   Stress at home 04/30/2021   Hyponatremia 03/06/2021   Menopausal disorder 03/06/2021   Nausea & vomiting 02/27/2021   Generalized abdominal pain 02/27/2021   Primary osteoarthritis of left knee 07/16/2020   Vasculitis (HCC) 07/04/2020   RA (rheumatoid arthritis) (HCC) 07/04/2020   Preop exam for internal medicine 06/03/2020   Menopause 05/31/2020   Allergic contact dermatitis 05/28/2020   Osteopenia 03/29/2020   Degenerative disc disease, cervical 06/15/2019   Acquired leg length discrepancy 06/15/2019   Leg pain 06/14/2019   Toe ulcer (HCC) 06/14/2019   Mixed hyperlipidemia 02/04/2019   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   Trigger finger of left thumb 03/09/2017   Spinal stenosis, lumbar region, 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   Sciatica of right side associated with disorder of lumbar spine 02/26/2017   Carpal tunnel syndrome, right 01/12/2017   CAD in native artery 12/23/2016   Tick bite 09/09/2016   Back pain with right-sided radiculopathy 06/26/2016   Complete tear of right rotator cuff 06/26/2016   Glenohumeral arthritis, right 06/26/2016   Acquired scoliosis 12/13/2015   Abdominal pain, epigastric 05/09/2015   Dry skin dermatitis 08/04/2012   Constipation  08/04/2012   History of hypothyroidism 08/04/2012   Otitis media, serous 12/25/2010   OSTEOARTHRITIS, HIPS, BILATERAL 06/20/2010   Anxiety disorder 05/03/2009   TOBACCO USE, QUIT 05/03/2009   ONYCHOMYCOSIS 06/28/2008   Actinic keratosis 06/28/2008   Bilateral carotid bruits 06/28/2008   Attention deficit disorder 05/17/2007   Irritable bowel syndrome 05/17/2007   Dyslipidemia 02/28/2007   Depression 02/28/2007   OA (osteoarthritis) of knee 02/28/2007    PCP: Plotnikov, [name], MD  REFERRING PROVIDER:  Mat Browning, MD  REFERRING DIAG: R35.0 (ICD-10-CM) - Frequency of micturition  THERAPY DIAG:  Other lack of coordination  Muscle weakness (generalized)  Other low back pain  Rationale for Evaluation and Treatment: Rehabilitation  ONSET DATE: 2024  SUBJECTIVE:                                                                                                                                                                                           SUBJECTIVE STATEMENT: Patient reports that she had difficulty with her breathing exercises She has been constipated and takes Miralax  every 4 days to get something moving since her colonoscopy accident Patient reports that she had some leaking last night aftre 2 glasses of wine and Miralax  and a cup of water  before bed after 8 pm.  She reports that she has been constipated since she was 73 years old worse after the accident 3 years ago.      From eval: Pt reports that she can't hold her urine in the middle of the night. Can't hold the urine from the bed to the toilet.  During the day she is ok, no leaking.  Not aware of any recent changes Pt reports that she has drop foot and sciatica, chronic back pain and has ortho PT on [facility] ) Pt reports that she has not done any exercises, Kegels in years.  Fluid intake: water - 2x24 oz/ day  PAIN: in her spine- she is losing cartilage in L4, L5 and S1 Are you having pain? Yes NPRS scale: 8/10  PRECAUTIONS: None  RED FLAGS: None   WEIGHT BEARING RESTRICTIONS: No  FALLS:  Has patient fallen in last 6 months? No  OCCUPATION: retired  ACTIVITY LEVEL : active, does not sit, hurts to sit, moves all the time, 3-4 times/ week in the gym, walks in the pool backwards because she was told it strengthens her spine  PLOF: Independent  PATIENT GOALS: to stay dry  PERTINENT HISTORY:   Sexual abuse: No  BOWEL MOVEMENT: yes- had a colonoscopy 2.5 years ago hemorrhaged , was  hospitalized 2x since then, has not had normal  bowel movements since then Pain with bowel movement: No Type of bowel movement:Type (Bristol Stool Scale) 1-7 depends on  if she takes Miralax  Fully empty rectum: Yes:   Leakage: No Pads: No Fiber supplement/laxative Yes Miralax   URINATION: Pain with urination: No Fully empty bladder: Yes: takes a while Stream: Strong Urgency: No Frequency: depends on how much she drinks Leakage: Walking to the bathroom at night, standing up from sitting, when she drinks too much  Pads: No  INTERCOURSE:  Ability to have vaginal penetration Yes  Pain with intercourse: Initial Penetration and During Penetration- cannot imagine pain free intercourse- she reports that she shrunk and  DrynessYes  Climax: no Marinoff Scale: 3/3 Laxative: Miralax   PREGNANCY: Vaginal deliveries 2 Tearing Yes:   Episiotomy Yes  C-section deliveries 0 Currently pregnant No  PROLAPSE: None   OBJECTIVE:  Note: Objective measures were completed at Evaluation unless otherwise noted.   PATIENT SURVEYS:    PFIQ-7: to be filled later d/ time  COGNITION: Overall cognitive status: Within functional limits for tasks assessed     SENSATION: Light touch: Appears intact  LUMBAR SPECIAL TESTS:  Straight leg raise test: Positive for hypermobility  GAIT: Assistive device utilized: None Comments: slightly antalgic  POSTURE: rounded shoulders, forward head, and right pelvic obliquity   LUMBARAROM/PROM: hinges and keep lumbar spine flat  LOWER EXTREMITY ROM: hypermobile throughout hips  LOWER EXTREMITY MMT: 4/5  PALPATION:    General: some tightness in vertical abdominal scar, lower abdominal weakness, hypermobility throughout her upper and lower extremities and trunk  Pelvic Alignment: uneven due to  scoliosis  Abdominal: lower abdominal weakness, with abdominal contraction putting pressure on bladder and has difficulty with activation                 External Perineal Exam: mild dryness present                             Internal Pelvic Floor: bulges pelvic floor with contraction, mild dryness  Patient confirms identification and approves PT to assess internal pelvic floor and treatment Yes Patient confirms identification and approves PT to assess internal pelvic floor and treatment Yes No emotional/communication barriers or cognitive limitation. Patient is motivated to learn. Patient understands and agrees with treatment goals and plan. PT explains patient will be examined in standing, sitting, and lying down to see how their muscles and joints work. When they are ready, they will be asked to remove their underwear so PT can examine their perineum. The patient is also given the option of providing their own chaperone as one is not provided in our facility. The patient also has the right and is explained the right to defer or refuse any part of the evaluation or treatment including the internal exam. With the patient's consent, PT will use one gloved finger to gently assess the muscles of the pelvic floor, seeing how well it contracts and relaxes and if there is muscle symmetry. After, the patient will get dressed and PT and patient will discuss exam findings and plan of care. PT and patient discuss plan of care, schedule, attendance policy and HEP activities.     PELVIC MMT:   MMT eval  Vaginal 2/5. 5 reps  Internal Anal Sphincter   External Anal Sphincter   Puborectalis   Diastasis Recti 1 finger  (Blank rows = not tested)        TONE: low  PROLAPSE: Anterior vaginal wall laxity  TODAY'S TREATMENT:                                                                                                                              DATE:  Neuro - hip adduction with ball with transverse abdominis breath Difficulty with holding thera band due to r shoulder and left wrist pain Ball press by elbow          11/03/23  EVAL EVAL  Examination completed, findings reviewed, pt educated on POC, HEP, and female pelvic floor anatomy, reasoning with pelvic floor assessment internally with pt consent. Pt motivated to participate in PT and agreeable to attempt recommendations.     PATIENT EDUCATION/ there acts Education details: Pt was educated on relevant anatomy, exam findings, HEP, expectations of PT   Person educated: Patient Education method: Explanation, Demonstration, Tactile cues, and Verbal cues Education comprehension: verbalized understanding, returned demonstration, verbal cues required, tactile cues required, and needs further education Transverse abdominis breath with ball press- difficult HOME EXERCISE PROGRAM: Access Code: [email or web address] URL: [email or web address] Date: 11/23/2023 Prepared by: Cori Curley [place]  Program Notes exhale on exertion- blow out to lift pelvic floor and engage abdomen on the hard part of the movement  Exercises - Supine Pelvic Floor Contraction  - 1 x daily - 7 x weekly - 2 sets - 10 reps - Pelvic Floor Contractions in [place] with Adduction  - 1 x daily - 7 x weekly - 2 sets - 10 reps - Shoulder extension with resistance - Neutral  - 1 x daily - 7 x weekly - 2 sets - 10 reps - Seated Abdominal Press into [employer]  - 1 x daily - 7 x weekly - 2 sets - 10 reps - Sit to Stand with Pelvic Floor Contraction  - 1 x daily - 7 x weekly - 2 sets - 10 reps - Rowing with Pelvic Floor Contraction  - 1 x daily - 7 x weekly - 2 sets - 10 reps - Diaphragmatic Breathing to Reduce Intra-abdominal Pressure: Lifting a Basket  - 1 x daily - 7 x weekly - 2 sets - 10 reps - Diaphragmatic Breathing to Reduce Intra-abdominal Pressure: Sit to Stand  - 1 x daily - 7 x weekly - 2 sets - 10 reps - Seated Exhale with Pelvic Floor Contraction and Hand to Mouth  - 1 x daily - 7 x weekly - 2 sets - 10 reps  ASSESSMENT:  CLINICAL IMPRESSION: Patient was seen today for treatment of urinary leaking.  Patient with constipation as well. Patient did fairly well  with exercises, manual therapy and education today. We discussed progress, HEP and recommended abdominal massage. Treatment session focused on pelvic floor strengthening and coordination with breath to improve core coordination. Patient had some difficulty with focusing. Patient is progressing slowly towards goals and will benefit from continued PT to address deficits, reduce leaking and constipation and improve quality of life.      From evaluation  Patient is a 73 y.o. F who was seen today for physical therapy evaluation and treatment for urinary incontinence. Pt with low back chronic pain, constipation and dyspareunia, seems very frustrated. Reported that she has chronic sciatica,  was told her whole life to brace her core, she puts pressure on her bladder with bracing of her upper abdominals. With internal exam findings notable for pelvic floor weakness, anterior vaginal wall laxity and decreased coordination and awareness. Pt only leaks at night after she has a lot of water , she wakes up between 1-3 and does not make it to the bathroom. Discussed reduced water  intake at least 3 hrs before bedtime. Reported dyspareunia d/t dryness, vaginal moisturizer samples given. Pt has many orthopedic issues- left wrist weakness after an injury 50 years ago or more, right shoulder limited AROM and chronic low back pain. Pt in constant need of moving her body to have reduced pain.  Pt will benefit from PT to address deficits   OBJECTIVE IMPAIRMENTS: decreased activity tolerance, decreased coordination, decreased knowledge of condition, difficulty walking, decreased strength, impaired tone, and pain.   ACTIVITY LIMITATIONS: bending, sleeping, continence, and toileting  PARTICIPATION LIMITATIONS: interpersonal relationship and community activity  PERSONAL FACTORS: Age, Behavior pattern, and Time since onset of injury/illness/exacerbation are also affecting  patient's functional outcome.   REHAB POTENTIAL: Good  CLINICAL DECISION MAKING: Evolving/moderate complexity  EVALUATION COMPLEXITY: Moderate   GOALS: Goals reviewed with patient? Yes  SHORT TERM GOALS: Target date: 12/01/2023     Pt will be I and consistent with her HEP and demonstrate all exercises correctly  Baseline: Goal status: INITIAL  2.  Pt will be educated on healthy bladder strategies Baseline:  Goal status: INITIAL  3.  Pt will be educated on urge suppression strategies and the knack Baseline:  Goal status: INITIAL  4.  Pt will be educate on constipation strategies and abdominal massage Baseline:  Goal status: INITIAL   LONG TERM GOALS: Target date: 01/26/2024    Patient able to engage her lower abdominal equally with the upper to reduce pressure on her pelvic floor.   Baseline:  Goal status: INITIAL  2.  Pt will not leak when walking to the bathroom at night.  Baseline:  Goal status: INITIAL  3.  Pt will be I with urge suppression strategies and the knack Baseline:  Goal status: INITIAL  4.  Pt will demonstrate at least 3/5 pelvic floor strength to reduce leaking Baseline:  Goal status: INITIAL  5.  Pt will report improving number of bowel movements to at least 5/ week Baseline:  Goal status: INITIAL  6.  Pt will report complete emptying of rectum Baseline:  Goal status: INITIAL  PLAN:  PT FREQUENCY: 1-2x/week  PT DURATION: 12 weeks  PLANNED INTERVENTIONS: 97110-Therapeutic exercises, 97530- Therapeutic activity, 97112- Neuromuscular re-education, 97535- Self Care, [id number]- Manual therapy, [phone]- Electrical stimulation (manual), [phone] (1-2 muscles), [phone] (3+ muscles)- Dry Needling, Patient/Family education, Taping, Joint mobilization, Joint manipulation, Spinal manipulation, Spinal mobilization, Scar mobilization, Cryotherapy, Moist heat, and Biofeedback  PLAN FOR NEXT SESSION: constipation strategies, pressure management exercises, urge  suppression, healthy bladder education    Emelly Wurtz, PT 11/23/2023, 8:59 AM

## 2023-11-24 ENCOUNTER — Telehealth: Payer: Self-pay | Admitting: Psychiatry

## 2023-11-24 NOTE — Telephone Encounter (Signed)
 LVM, suspect she is taking about Trileptal .

## 2023-11-24 NOTE — Telephone Encounter (Signed)
 Pt needs a cb to discuss sig. States what she and Dawna discussed is different from what's on her bottle.

## 2023-11-25 NOTE — Telephone Encounter (Signed)
 Pt returned call. She only got a 30-day supply of Trileptal , when she usually gets 90 days. Told her that she has an appt next week and Dr. Geoffry doesn't like for us  to send in more medication than she needs to get to her appt in case dose changes.

## 2023-11-26 DIAGNOSIS — F411 Generalized anxiety disorder: Secondary | ICD-10-CM | POA: Diagnosis not present

## 2023-11-30 ENCOUNTER — Ambulatory Visit: Attending: Obstetrics and Gynecology | Admitting: Physical Therapy

## 2023-11-30 ENCOUNTER — Telehealth: Payer: Self-pay | Admitting: Physical Therapy

## 2023-11-30 ENCOUNTER — Ambulatory Visit (INDEPENDENT_AMBULATORY_CARE_PROVIDER_SITE_OTHER): Admitting: Psychiatry

## 2023-11-30 ENCOUNTER — Encounter: Payer: Self-pay | Admitting: Psychiatry

## 2023-11-30 DIAGNOSIS — F902 Attention-deficit hyperactivity disorder, combined type: Secondary | ICD-10-CM | POA: Diagnosis not present

## 2023-11-30 DIAGNOSIS — Z79899 Other long term (current) drug therapy: Secondary | ICD-10-CM

## 2023-11-30 DIAGNOSIS — F411 Generalized anxiety disorder: Secondary | ICD-10-CM

## 2023-11-30 DIAGNOSIS — R278 Other lack of coordination: Secondary | ICD-10-CM | POA: Insufficient documentation

## 2023-11-30 DIAGNOSIS — F331 Major depressive disorder, recurrent, moderate: Secondary | ICD-10-CM | POA: Diagnosis not present

## 2023-11-30 DIAGNOSIS — M6281 Muscle weakness (generalized): Secondary | ICD-10-CM | POA: Insufficient documentation

## 2023-11-30 DIAGNOSIS — M5459 Other low back pain: Secondary | ICD-10-CM | POA: Insufficient documentation

## 2023-11-30 DIAGNOSIS — G251 Drug-induced tremor: Secondary | ICD-10-CM | POA: Diagnosis not present

## 2023-11-30 MED ORDER — LORAZEPAM 0.5 MG PO TABS: 0.5000 mg | ORAL_TABLET | Freq: Three times a day (TID) | ORAL | 0 refills | Status: AC

## 2023-11-30 MED ORDER — FOLIC ACID 1 MG PO TABS: 1.0000 mg | ORAL_TABLET | Freq: Every day | ORAL | 3 refills | Status: AC

## 2023-11-30 MED ORDER — OXCARBAZEPINE 300 MG PO TABS
ORAL_TABLET | ORAL | 0 refills | Status: DC
Start: 2023-11-30 — End: 2023-12-30

## 2023-11-30 MED ORDER — AMPHETAMINE-DEXTROAMPHETAMINE 15 MG PO TABS: 7.5000 mg | ORAL_TABLET | Freq: Two times a day (BID) | ORAL | 0 refills | Status: DC

## 2023-11-30 NOTE — Progress Notes (Signed)
 [name] [id number] 1950/06/28 73 y.o.   Subjective:   Patient ID:  Courtney [name] is a 73 y.o. (DOB [birth year]) female.  Chief Complaint:  Chief Complaint  Patient presents with   Follow-up     Courtney [name] presents to the office today for follow-up of first visit 03/01/2020 referred by a friend [name].  Was prescribed Viibryd  and Vyvanse was changed to Concerta  54 mg to try to reduce the amount of dry mouth.  05/02/2020 appointment with the following noted: Less dry mouth with Concerta  but didn't seem to last beyond 3-4 PM. Viibryd  20 mg daily.  Hard to get enough calories at breakfast.  But has been taking both in the morning.  Some benefit with Viibryd . Is there something I can take besides a stimulant, bc fears it is stressing her body.  Dx college exhausted adrenal glands.  It helps.  Asked questions about alternatives to stimulants.  Reduced Concerta  to reduce anxiety and see if can achieve, but she thinks it's worse in the evening. Anxiety is still a struggle.  Life crisis moment.  Chronic marital dissatisfaction worse now that both are retired.  Feels like she's in a prison.  Family notices she's stressed.  Has done therapy since age 78 yo.  Has done Hindu meditation without help.  Christian faith disciplines.  Grew up caretaking.  I'm done and I need to change.  Goal is calm down enough to function through the situation. Tingling foot and wants B12 testing. Plan: Switch Viibryd  to 20 mg in evening meal to get better absorption.  06/18/2020 appointment with following noted: No difference in anxiety or mood with Viibryd  20 mg daily.  She doesn't want to increase it. Questions about Viibryd  and Genesight testing.  Asks about differences between Adderall and MPH.   Missed for 3 days Concerta  and took Adderall.    Tired by 4-5 PM and doesn't like that.    Plan: She wants to wean off the Viibryd  because she does not feel it has been helpful and she does not  want to increase the dosage.  07/03/2020 phone call patient stating she wanted to continue Viibryd  20 mg daily.  She also requested refill of Concerta  54 mg  08/14/2020 appointment with the following noted: Was told she couldn't take Viibryd  with one of the pain meds and had TKR.  So stopped it.  Couldn't tolerate pain meds except tramadol .  Had more pain than expected.   Stopped Viibryd  about 07/15/20.  She had taken tramadol  with Viibryd  in the past without a problem.   Noticing more pain after surgery in non-surgical places. Seems to be sensitive to getting lightheaded and confused with low blood sugar.   Poor attention and scattered since surgery.  Also more tired. Occ taken tramadol  and otherwise just Tylenol . Impossible to tell the effect of the Viibryd  given she hasn't been herself for months. Concerned about H's Geoff's memory who is also a patient here.  Wants him to get neuropsych testing. Plan because patient is med sensitive we will start very low-dose fluvoxamine  25 mg nightly Per her request continue Concerta  54 mg every morning  10/25/2020 appointment with the following noted: Several phone calls since being here.  The first led to increasing fluvoxamine  to 1-1/2 of the 25 mg tablets due to lack of effect at 3 weeks. 09/21/2020 she called asking to stop Concerta  which she did. She called again wanting to start Adderall.  Because she had taken it in 2019 it was  agreed that she could start Adderall 10 mg twice daily. Stress dealing with husband and whether to move or not.  May separate.  Anxiety is very high.  Hard to calm down around him. $ stress.  Explodes with anger at husband.  Doesn't think he can sell the house on his own. Never been this stressed and anxious and in this kind of circumstance before. Never got the Adderall DT need for PA. Only caffeine in the AM Plan: Rec increase Luvox  from 37.5 mg daily to 50 mg HS for a week then increase to 75 mg daily and possibly higher.   B12 level checked 496 and normal. Hold Adderall until the anxiety is under control.   12/03/2020 phone call from patient: Patient called stating fluvoxamine  made her more anxious and high strung and she stopped it.  She wanted an earlier appointment which was not available at the time she was given the option to see a nurse practitioner but refused.  12/19/2020 appointment with the following noted: Life very stressful right now and not getting better.  B in law died and marriage falling apart. Separating.   More than I can handle including anxiety and depression. Plan: Rec trial beta blocker propranolol  10-30 mg  twice daily for anxiety Consider TCA bc Genesight test  B12 level checked 496 and normal. Hold Adderall until the anxiety is under control.   01/01/2021 phone call from patient with nurse as follows: She is taking up to 30 mg propranolol  twice daily without sufficient benefit for her anxiety.  01/03/2021 appointment with the following noted: Rare Adderall.  Propranolol  didn't help anxiety much at 30 mg BID without SE.[name]   Thinks she's gotten depressed which is unusual.  In a perfect storm with divorce and moving and financial stress.  Not functioning well.  Hard to make a decision.  Poor productivity.   Having a lot of pain, chronically and worse lately. Starting Lyrica  today. Taking alprazolam  about 0.5 mg daily. Plan: Yes imipramine  25 and increase to 75 mg HS and then check blood level  02/26/21 appt noted: 01/21/2021 serum imipramine  and desipramine  total was 65 at 75 mg daily. It has made a difference.  But has made so many changes and moving.  Stress goes to GI system.   Was in ER last night with GI px and may have ulcer.  Unable to eat without nausea.  Lump in the throat nausea. Mild taking the edge off.  Can still explode. Can't remember the SE at 100 mg daily. GI dominating with pain and nausea. Sleep varies from good or bothered by sickness. Usually fine but sometimes can't  turn her brain off.  Moves in 5 days. Plan: Continue imipramine  and increase to 100 mg again as soon as nausea managed. Add risperidone  1 mg HS off label for nausea and anxiety.  03/28/2021 appt noted: She is not taking the risperidone .  Tried it for a couple of weeks but did not help the nausea.  Didn't notice it helping anxiety but had tremor.  Off for 2 weeks and tremor better. Still on imipramine  75 mg nightly.  She did increase but wasn't sure it was causing the tremor as instructed. This week to GI and work up ordered and doubled med dose. Not handling the transition well.  Very irritable with husband.  Such a sense of urgency.   Not often with Xanax .   Not on Adderall.   Plan: imipramine  and increase to 100 mg nightly.  B12 level  checked 496 and normal. Hold Adderall until the anxiety is under control.  Switch Xanax  to Ativan  1 mg tid since she is not on Adderall.    05/02/2021 appt noted: Increased imipramine  100 mg HS. Tolerated the increase Spleen bleed. Yesterday decent energy compared to what she had.  Ativan  made her sleepy but only took it once. Sleeping a lot DT spleen injury. Can't tell about depression bc of injury kept her in bed.  Has to move to feel normal. Plan: imipramine  and increase to 150 mg nightly, based on prior level.  05/29/21 appt noted: SE dry mouth Increased imipramine  150 mg HS Seen improvement in mood.  Meeting irritating situations with less response.  Better self control.  Less fear and anxiety. Ativan  1 mg prn makes her too sleepy and tired. Occ taken 1/2 Adderall with some energy and motivation.   GI work up ongoing with gastric emptying yesterday.  It's delayed ongoing.  GI doctor has commented on her hostility.   Sleeping really well.   Worst dep and anxiety was when moving and now 50% better.  Need to have some goals and since moving into townhouse and unknown what is next.  May move out of town to be near the kids at part of the year but it's too  cold.  Big decisions are hard. Difficulty with back pain so hasn't unpacked.   More hopeful about her health. Seasonal depression Plan: Imipramine  is helpful but increase could be more helpful based on level.  Disc pros and cons on increase.  She wants to increase it..  Consider lithium  augmentation. Increase imipramine  to 200 mg nightly, based on prior level Disc light therapy in detail and gave handout.    Adderall prn. Reduce Ativan  1/2 mg tid prn since she is tired from it and not on Adderall usually.    07/05/2021 phone call complaining of dry mouth with imipramine .  Frustrating because of reportedly low sodium and was told to drink less water . MD response:We increased the imipramine  from 3 of the 50 mg tablets to 4 of the 50 mg tablets on February 1.  If she has seen additional improvement in her mood it is to her advantage to continue the current dosage.  If she has not seen additional improvement since increasing the dose she can drop the imipramine  back to 3 tablets daily. In addition to that she should discuss the dry mouth problem with her pharmacist as the pharmacist may have some other recommendations to help manage it.  But the usual recommendation is to switch her toothpaste to Biotene toothpaste and use the Biotene mouthwash and Biotene spray to manage the dry mouth as best she can. Patient response:Notified patient. She said the issue is more complex than just dry mouth. She has been having GI issues and has been very sick, had a splenic bleed, is unable to get out of bed at times. Dr[facility] Gwynneth at [facility] is her GI doctor. Patient spoke to her after Wilbert talked to her today and is wanting her to stop the imipramine  to eliminate that as a source of her issues.   MD response:Okay she can come off the imipramine  better mood anxiety and irritability will get worse off of it because it helped her.  However we cannot start any new medicines until we see if her physical symptoms are any  different off the medicine.  She can reduce imipramine  by 1 tablet every 3 to 4 days.  The dry mouth will not  resolve until she is off the medicine.  The splenic bleed is not related to imipramine  unless she has some bizarre sort of allergy .  08/01/21 appt noted: Santina totally off imipramine  for a week and GI px not better and restarted imipramine  to 100 mg HS DT chronic constipation and then this week increased to 150 just this week.   Therapy with Berwyn helping a lot. Overall depression and anxiety is better. Trying to find endo.  Medically cleared from splenic bleed but doesn't think it is fine. Episode of abd paine with result in bed 5 days but back out of bed and functioning again. Hasn't needed lorazepam  lately. Recognizes still angry and intense too much .   Plan check level and add lithium  300  10/07/21 appt noted; Taboo about lithium  and didn't take it. Irritability is still focused and directed at H and not sure how it would be wihtout him.  Trying to address this with therapy and faith and prayer.   Seeing Berwyn Ellen.   Everything fuels my fire.  Erupting this is never gonna change rage.  Recognizes this part is her problem.  Gets upset with him not doing things she wants done. Body getting stronger since Xmas and more she can do the better her depression.   Still some stiffness with pain in spine and leg in AM and then gets better. Not seriously depressed if can move. Don't handle inactivity well.  Able to do more now though has helped. Can't control her reaction to Chyrl though knows it is over the top since she retired. Intermittent nausea with activity causes her to go to bed.  Waves of pain with it.  Frustrated cause is unknown. Plan: Continue imipramine  150 mg HS for a week and level was WNL at 238. Augment with lithium  CR 300 mg HS for anger and mood  01/22/22 appt noted: Currently take imipramine  100 mg HS and lithium  CR 300 mg HS, Adderall 10 BID Reduced imipramine  to  help tremor and it is some better.  Occ tremor, positional.  Mood is a lot better with lithium .  Right away clearing of haze and calming down.  Better outlook and been like that since then. Questions about effects on organs.   Sleep well.   Not sig anxious now.  Occ moments of anxiety.   Asks if could stop imipramine  bc benefit with lithium .  04/10/22 appt noted: Currently stopped imipramine  100 mg HS (2 days ago) and taking lithium  CR 300 mg HS, Adderall 10 BID Good but have to stop the imipramine  bc of the shaking.  She cut dose to 50 mg QOD. Tremor worse with R hand.  R handed.  Feels like lithium  helped She wants to see what mood is like without imipramine .  Less tremor with less imipramine . Tremor is better with less right now. May need shoulder replacement DT pain.  Also needs spinal fusion DT dropped foot. Hard to talk about it bc so dreads it.   Irritability is there and when ignited is bad but working on it in therapy.    Been angry my whole life.  Back to trauma in childhood.  Overall though better with lithium .   Miralax  helped GI problems but waves of ok and then GI px.   Needs Adderall daily. Takes Ativan  just occ with stressful circumstances. Plan: on imipramine  100 mg HS for a week and level was WNL at 238. She stopped it this week and disc risk of relapse of dep but  she hopes lithium  will keep dep at bay Continue lithium  CR 300 mg HS bc helped anger and mood  06/17/22 appt noted: recently taking 1/2 lithium  300 and imipramine  50 mg HS for 4-5 days and tremor better with less lithium . CO tremor;   irritates her and makes her feel bad about herself.  Never really tried more 10 mg propranolol  for tremor. More pain in shoulder.   Not sure if any change in explosive irritation at H.  Not sure if it is her H or bc of the relationship. Depression is not that bad in the last month and no worse with less imipramine .   Would like to put a spacer between triggers and what she says to  H .  Not good.   When in [place] 2 yr ago for 2 mos alone but still had underlying anger problems. Plan: no med changes  07/14/22 appt noted: Increased lithium  back to 1 and 1/2 tablet for a month.   Till has it in R hand esp.  Uses propranolol  sometimes. Only tried it 4 times. An on impramine 50 mg HS imipramine  100 mg HS for a week and level was WNL at 238. Mood not bad at all  Irritability ?  Better with more lithium .  09/10/22 appt noted: Chronic procrastination unless workig job. Mood is not bad except triggered by H.  No change with increase lithium .  B6 400 BID has reduced and managed tremor. Almost entirely stopped Adderall bc GI sx if not taken with food.  Gut never normal since spleen issue.  Feels something wrong with GI tract.  Cycles of constipation and N and then abd cramping for days at time and then it will be ok for awhile.   Psych meds imipramine  50 mg HS, lithium  300/450 QOD, Adderall 10 BID prn, lorazepam  1 mg prn (none lately, I don't like to be slowed down). Taking miralax  every other day.   Anxiety is not unusuallly high  no panic but some chronic anxiety. Sleep well at night and needs nap if not 8 hours. More postive when on Adderall re: mood and better focused and task completion.    01/01/23 appt noted: Psych meds: Adderall XR 15, cyclobenzaprine 10 mg as needed in the AM for tremor, imipramine  50 mg nightly, lithium  ER 450 mg nightly. (Never increased), lorazepam  rarely She reduced imipramine  to 50 bc she is convinced it is causing the shaking.  Not very much tremor but some postural.   Mood is ok.  I'm happy.   Has Covid.  Day 4-5 of sx, pretty mild.    03/05/23 appt noted: Psych med: as above, imipramine  50 mg HS, lithium  ER 450 mg HS, Adderall XR 15 AM Wonders about ongoing nonstop battle with constipation since colonoscopy.  Also SE dry mouth 3-4 hours.  Level of dep is much better than it was.   Working in therapy with Berwyn Ellen. H Geoff remains target of  eruptive anger If there was a way to calm me down Chronic constipation from childhood is worse.   Plan: increase lithium  600 mg every day trial for ongoing anger.   If it helps then can try reducing imipramine  Check lithium  level and BMP  05/07/23 appt noted: Psych med: as above, imipramine  50 mg HS, lithium  ER 450 mg HS, Adderall XR 15 AM She increased lithium  to 600 mg daily and didn't notice a change so went back to 450 mg daily and no change noticed. No SE nor benefit.  Unless tremor related.  When gripping the tremor shows up on R not left .  No resting tremor.   Went on trips and very anxiety provoking and didn't notice than in years past.   Not more anxious after taking Adderall.  Still jumps from one thing to the next.  Poor efficiency at home.   Plan trial buspirone   07/08/23 appt noted: Psych med: as above, imipramine  50 mg HS, lithium  reduced to 300 mg HS, Adderall XR 15 AM daily,  buspirone  15 BID SE : Bothered by tremor.  Is more persistent.  Tremor predated lithium . Mood is good.  Anxiety better when active.  Need to get out of the house to manage it.   Sleep likes to sleep 9-10 hours and can do it consistently. Caffeine 1 daily. Stopped imipramine  and shaking got better.  Have continued the lihtium.   09/22/23  appt noted: Psych med: imipramine  50 mg HS, lithium  ER 450 mg HS, NO Adderall XR 15 AM daily,  restarting buspirone  15 BID Stopped all meds since here.  Was having GI px and having trouble with taking pills.  N would start in the morning.  Had some issues with pills for years without eating even tylenol .   Stopped Adderall and buspirone .  Thinks Adderall was causing some GI Trip triggered worsening CBP and mobility. Hosp with difficulty walking and MRI spinal degeneration.   Gi IS BETTER  WITOUT aDDERALL.   Only px is H.  She gets angry wiith H and overreacts.  Recognizes she is irritable.  Working on it in therapy.  Will cut him off even before he finishes sentence.    More scattered without Adderall. Plan: Stop buspirone  Start Trileptal  (oxcarbazepine ) 150 mg tablet at night for 1 week,  then 1 tablet at night and 1/2 tablet in the morning and 1 tablet at night for 1 week,  Then increase to 1 tablet in the morning and 1 and 1/2 tablets at night   11/30/23 appt noted:  Med: oxcarbazepine  300 mg AM and 600 mg PM, lithium  150 mg daily,  Adderall 15 mg 1/2 BID,  lorazepam  1 mg TID prn SE sleepy Shaking 70% better with reduction lithium .    Positional.  Don't respond any less but less screaming and don't go as high with agitation.   But feels like has sudden onset of Alzheimer's.   Sleep perfect 8 hours.  But does have chronic pain.  Easier to wake up.  Love that.   Dep really good lately.      Past Psychiatric Medication Trials:  Trintellix NR, sertraline?,  Viibryd  20, remote zoloft, Paxil,  Cymbalta  couple days ? Adverse reaction Imipramine  150 better imipramine  100 mg HS for a week and level was WNL at 238. Fluvoxamine  SE anxiety but ? Adequacy of trial bc stress Fluvoxamine  100 in 2015 per Dr. Blinda  Lithium  300 helpful, 600 not more helpful, tremor  Gabapentin  1600 NR Lyrica   Risperidone  1 tremor  Vyvanse, Adderall, concerta   DC propranolol  10-30 mg  twice daily for anxiety bc not helpful, Lorazepam , Xanax  sleepy. Diazepam  sleepy.   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 Blinda and Darice Molt  Started therapy at 88 yo bc fear mother would die any minute bc she had cardiac px.  Review of Systems:  Review of Systems  Cardiovascular:  Negative for chest pain.  Gastrointestinal:  Positive for abdominal pain and nausea.  Musculoskeletal:  Positive for arthralgias, back pain and gait problem.  Neurological:  Positive for  dizziness and tremors. Negative for weakness.  Psychiatric/Behavioral:  Positive for dysphoric mood. Negative for agitation, confusion, decreased concentration and hallucinations. The patient is nervous/anxious.      Medications: I have reviewed the patient's current medications.  Current Outpatient Medications  Medication Sig Dispense Refill   albuterol  (VENTOLIN  HFA) 108 (90 Base) MCG/ACT inhaler Inhale 2 puffs into the lungs every 4 (four) hours as needed for wheezing or shortness of breath. 18 g 3   cyclobenzaprine (FLEXERIL) 10 MG tablet Take 10 mg by mouth as needed.     fluticasone  (FLONASE ) 50 MCG/ACT nasal spray Place into both nostrils daily.     folic acid  (FOLVITE ) 1 MG tablet Take 1 tablet (1 mg total) by mouth daily. 90 tablet 3   hydroxychloroquine (PLAQUENIL) 200 MG tablet Take 1 tablet by mouth 2 (two) times daily.     levothyroxine (SYNTHROID) 50 MCG tablet Take 50 mcg by mouth daily before breakfast.     omega-3 acid ethyl esters (LOVAZA ) 1 g capsule Take 2 capsules (2 g total) by mouth 2 (two) times daily. 120 capsule 11   ondansetron  (ZOFRAN -ODT) 4 MG disintegrating tablet Take 1 tablet (4 mg total) by mouth every 8 (eight) hours as needed. 20 tablet 0   Testosterone  1.62 % GEL Place 5 mg onto the skin daily. 30 g 3   traMADol  (ULTRAM ) 50 MG tablet TAKE 1/2 TO 1 TABLET(25 TO 50 MG) BY MOUTH BACK EVERY 8 HOURS AS NEEDED FOR SEVERE PAIN 90 tablet 0   vitamin E 400 UNIT capsule Take 800 Units by mouth daily.      amphetamine -dextroamphetamine  (ADDERALL) 15 MG tablet Take 0.5 tablets by mouth 2 (two) times daily. 90 tablet 0   LORazepam  (ATIVAN ) 0.5 MG tablet Take 1 tablet (0.5 mg total) by mouth every 8 (eight) hours. 90 tablet 0   nitroGLYCERIN  (NITROSTAT ) 0.4 MG SL tablet Place 1 tablet (0.4 mg total) under the tongue every 5 (five) minutes as needed for chest pain. (Patient not taking: Reported on 05/25/2023) 25 tablet 2   Oxcarbazepine  (TRILEPTAL ) 300 MG tablet 1 tablet in the morning and 2 tablets at night 270 tablet 0   No current facility-administered medications for this visit.    Medication Side Effects: Other: dry mouth  Allergies:  Allergies  Allergen Reactions    Clarithromycin Nausea Only   Covid-19 (Mrna) Vaccine Other (See Comments)    Acute Vasculitis; excessive bleeding  Other reaction(s): Other (See Comments) Acute Vasculitis; excessive bleeding    Other Other (See Comments)   Bactrim [Sulfamethoxazole-Trimethoprim] Nausea And Vomiting   Morphine Nausea And Vomiting    Other reaction(s): Nausea/Vomiting   Morphine And Codeine Nausea And Vomiting   Azithromycin Nausea And Vomiting and Nausea Only   Ceclor [Cefaclor] Nausea And Vomiting    Can take Augmentin  ok   Claritin [Loratadine]     REACTION: bruises   Codeine Nausea And Vomiting    Can take Tramadol  ok   Cymbalta  [Duloxetine  Hcl]    Erythromycin Nausea And Vomiting    Other reaction(s): Nausea/Vomiting   Erythromycin Base Nausea And Vomiting   Levofloxacin Nausea Only    REACTION: nausea   Lipitor [Atorvastatin ]     Other reaction(s): Myalgias (intolerance)   Nitrofurantoin  Nausea Only   Oxycodone  Other (See Comments)   Penicillins Nausea And Vomiting    Can take Augmentin    Propoxyphene Nausea Only    dizzy   Propoxyphene N-Acetaminophen  Nausea Only    dizzy    Past  Medical History:  Diagnosis Date   ADD (attention deficit disorder)    Anxiety    Celiac disease    possible vs IBS   Complication of anesthesia    Coronary artery disease    Depression    no bipolar per Dr. Blinda   GI problem    Obie   Gluten intolerance    Hyperlipidemia    IBS (irritable bowel syndrome)    Osteoarthritis    Osteoporosis    Pneumonia    PONV (postoperative nausea and vomiting)    Rheumatoid arthritis (HCC)    Seasonal allergies 01/08/2012   hx. of multiple bronchitis related to this.   Shoulder pain, right     Family History  Problem Relation Age of Onset   Heart disease Mother    Heart attack Mother    Prostate cancer Father    Heart disease Sister    Heart disease Maternal Grandmother    Breast cancer Paternal Grandmother    Colon cancer Paternal Grandfather     Coronary artery disease Other        FH Female 1st degree relative <60   ADD / ADHD Other    Other Daughter        gluten intolerance    Social History   Socioeconomic History   Marital status: Married    Spouse name: Not on file   Number of children: 2   Years of education: Not on file   Highest education level: Not on file  Occupational History   Occupation: Network engineer  Tobacco Use   Smoking status: Never   Smokeless tobacco: Never   Tobacco comments:    only Financial trader   Vaping status: Never Used  Substance and Sexual Activity   Alcohol use: Yes    Comment: 0-1 per day   Drug use: No   Sexual activity: Yes  Other Topics Concern   Not on file  Social History Narrative   Married for last 42 years.Lives with husband.Retired 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   Social Drivers of Corporate investment banker Strain: Low Risk  (06/08/2023)   Overall Financial Resource Strain (CARDIA)    Difficulty of Paying Living Expenses: Not hard at all  Food Insecurity: Low Risk  (07/13/2023)   Received from [facility]   Hunger Vital Sign    Within the past 12 months, you worried that your food would run out before you got money to buy more: Never true    Within the past 12 months, the food you bought just didn't last and you didn't have money to get more. : Never true  Transportation Needs: No Transportation Needs (07/13/2023)   Received from [facility]    In the past 12 months, has lack of reliable transportation kept you from medical appointments, meetings, work or from getting things needed for daily living? : No  Physical Activity: Sufficiently Active (06/08/2023)   Exercise Vital Sign    Days of Exercise per Week: 4 days    Minutes of Exercise per Session: 120 min  Stress: No Stress Concern Present (06/08/2023)   [employer] of Occupational Health - Occupational Stress Questionnaire    Feeling of Stress : Not at  all  Social Connections: Socially Integrated (06/08/2023)   Social Connection and Isolation Panel    Frequency of Communication with Friends and Family: More than three times a week    Frequency of Social Gatherings with Friends and  Family: More than three times a week    Attends Religious Services: More than 4 times per year    Active Member of Clubs or Organizations: Yes    Attends Banker Meetings: More than 4 times per year    Marital Status: Married  Catering manager Violence: Not At Risk (06/08/2023)   Humiliation, Afraid, Rape, and Kick questionnaire    Fear of Current or Ex-Partner: No    Emotionally Abused: No    Physically Abused: No    Sexually Abused: No    Past Medical History, Surgical history, Social history, and Family history were reviewed and updated as appropriate.   Please see review of systems for further details on the patient's review from today.   Objective:   Physical Exam:  LMP 01/08/1999   Physical Exam Constitutional:      General: She is not in acute distress. Musculoskeletal:        General: No deformity.  Neurological:     Mental Status: She is alert and oriented to person, place, and time.     Cranial Nerves: No dysarthria.     Coordination: Coordination normal.  Psychiatric:        Attention and Perception: Attention and perception normal. She does not perceive auditory or visual hallucinations.        Mood and Affect: Mood is anxious. Mood is not depressed. Affect is not labile, blunt, angry, tearful or inappropriate.        Speech: Speech normal. Speech is not rapid and pressured.        Behavior: Behavior normal. Behavior is cooperative.        Thought Content: Thought content normal. Thought content is not paranoid or delusional. Thought content does not include homicidal or suicidal ideation. Thought content does not include suicidal plan.        Cognition and Memory: Cognition and memory normal.        Judgment: Judgment  normal.     Comments: Insight intact Anger at home better.  Not in office Frustrated less     Lab Review:     Component Value Date/Time   NA 135 06/29/2023 1443   NA 134 06/14/2018 1444   K 4.7 06/29/2023 1443   CL 104 06/29/2023 1443   CO2 23 06/29/2023 1443   GLUCOSE 92 06/29/2023 1443   BUN 21 06/29/2023 1443   BUN 10 06/14/2018 1444   CREATININE 0.88 06/29/2023 1443   CALCIUM  9.5 06/29/2023 1443   PROT 6.6 08/27/2022 0940   PROT 6.5 12/19/2020 0754   ALBUMIN 4.0 08/27/2022 0940   ALBUMIN 4.5 12/19/2020 0754   AST 23 08/27/2022 0940   ALT 17 08/27/2022 0940   ALKPHOS 53 08/27/2022 0940   BILITOT 0.3 08/27/2022 0940   BILITOT 0.4 12/19/2020 0754   GFRNONAA 60 (L) 04/17/2021 1825   GFRNONAA 79 04/10/2020 1431   GFRAA 91 04/10/2020 1431       Component Value Date/Time   WBC 6.2 08/27/2022 0940   RBC 4.36 08/27/2022 0940   HGB 13.8 08/27/2022 0940   HCT 41.6 08/27/2022 0940   PLT 350.0 08/27/2022 0940   MCV 95.5 08/27/2022 0940   MCH 31.4 04/17/2021 1825   MCHC 33.0 08/27/2022 0940   RDW 13.7 08/27/2022 0940   LYMPHSABS 1.4 08/27/2022 0940   MONOABS 0.5 08/27/2022 0940   EOSABS 1.0 (H) 08/27/2022 0940   BASOSABS 0.1 08/27/2022 0940    Lithium  Lvl  Date Value Ref Range Status  06/29/2023 0.6 0.6 - 1.2 mmol/L Final     No results found for: PHENYTOIN, PHENOBARB, VALPROATE, CBMZ   06/04/20 B12 normal at 496  08/16/21   Latest Reference Range & Units 08/16/21 08:29  Desipramine  mcg/L 128  Imipramine  Lvl mcg/L 110   01/21/2021 imipramine  level 25, desipramine  level 40 equals total 65 which is low on 75 mg daily. 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 They have one copy of the C allele and one copy of the G allele. This genotype suggests a normal response to certain  ADHD medications.  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 This genotype suggests a lower risk of serious hypersensitivity reactions, including Stevens-Johnson syndrome (SJS), toxic epidermal necrolysis (TEN), maculopapular eruptions, and Drug Reaction with Eosinophilia and Systemic Symptoms when taking certain mood stabilizers.  HLA-B*1502Lower Risk Not Present This patient does not carry the HLA-B*1502 allele or a closely related *15 allele. Absence of HLA-B*1502 and the closely related *15 alleles suggests lower risk of serious dermatologic reactions including toxic epidermal necrolysis (TEN) and Stevens-Johnson syndrome (SJS) when taking certain mood stabilizers.  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 They carry two copies of the G allele. This genotype has been associated with an increased risk of adverse drug reactions with certain selective serotonin reuptake inhibitors.  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 The short promoter allele is reported to decrease expression of the serotonin transporter compared to the homozygous long promoter allele. The patient may have a moderately decreased likelihood of response to certain selective serotonin reuptake inhibitors due to the presence of the short form of the gene.  Pharmacokinetic Genes PK CES1A1Extensive (Normal) Metabolizer GLY/GLY CES1A1 - Gly allele enzyme activity: Normal CES1A1 - Gly allele enzyme activity: Normal  This genotype is most consistent with the extensive (normal) metabolizer phenotype.  The patient is expected to have normal enzyme activity.  CYP1A2Extensive (Normal) Metabolizer *1/*1 This genotype is  most consistent with the extensive (normal) metabolizer phenotype.  CYP2B6Poor Metabolizer *6/*6 CYP2B6*6 allele enzyme activity: Reduced CYP2B6*6 allele enzyme activity: Reduced  This genotype is most consistent with the poor metabolizer phenotype. This patient may have reduced enzyme activity as compared to individuals with the normal phenotype.  CYP2C19Extensive (Normal) Metabolizer *1/*1 CYP2C19*1 allele enzyme activity: Normal CYP2C19*1 allele enzyme activity: Normal  This genotype is most consistent with the extensive (normal) metabolizer phenotype.  CYP2C9Intermediate Metabolizer *1/*2 CYP2C9*1 allele enzyme activity: Normal CYP2C9*2 allele enzyme activity: Reduced  This genotype is most consistent with the intermediate metabolizer phenotype. This patient may have reduced enzyme activity as compared to individuals with the normal phenotype.  CYP2D6Extensive (Normal) Metabolizer *1/*41 CYP2D6*1 allele enzyme activity: Normal CYP2D6*41 allele enzyme activity: Reduced  This genotype is most consistent with the extensive (normal) metabolizer phenotype.  CYP3A4Poor Metabolizer *22/*22 CYP3A4*22 allele enzyme activity: Reduced CYP3A4*22 allele enzyme activity: Reduced  This genotype is most consistent with the poor metabolizer phenotype. This patient may have reduced enzyme activity as compared to individuals with the normal phenotype.  UGT1A4Ultrarapid Metabolizer *1/*3 UGT1A4*1 allele enzyme activity: Normal UGT1A4*3 allele enzyme activity: Increased  This genotype is most consistent with the ultrarapid metabolizer phenotype.  This patient may have increased enzyme activity as compared to individuals with the normal phenotype.  UGT2B15Intermediate Metabolizer *2/*2 UGT2B15*2 allele enzyme activity: Reduced UGT2B15*2 allele enzyme activity: Reduced  This genotype is most consistent with the intermediate metabolizer phenotype. This patient may have reduced enzyme  activity as compared to individuals with the normal phenotype. .res Assessment: Plan:    Maddelyn was seen today for follow-up.  Diagnoses and all orders for this visit:  Major depressive disorder, recurrent episode, moderate (HCC) -     folic acid  (FOLVITE ) 1 MG tablet; Take 1 tablet (1 mg total) by mouth daily. -     Oxcarbazepine  (TRILEPTAL ) 300 MG tablet; 1 tablet in the morning and 2 tablets at night  Generalized anxiety disorder -     folic acid  (FOLVITE ) 1 MG tablet; Take 1 tablet (1 mg total) by mouth daily. -     LORazepam  (ATIVAN ) 0.5 MG tablet; Take 1 tablet (0.5 mg total) by mouth every 8 (eight) hours. -     Oxcarbazepine  (TRILEPTAL ) 300 MG tablet; 1 tablet in the morning and 2 tablets at night  Attention deficit hyperactivity disorder (ADHD), combined type -     amphetamine -dextroamphetamine  (ADDERALL) 15 MG tablet; Take 0.5 tablets by mouth 2 (two) times daily.  Lithium  use  Tremor due to multiple drugs   30 min phone time with patient was spent on counseling and coordination of care. We discussed    Overall depression is better and anxiety was better too.     Overall less irritable than depressed but just with H.  Oxcarb helped irritability but ? Forgetful and balance issues but has had some of this for years. Some chronic anxiety but not severe now.  Rec she not change meds on her own.  This has been an issue  She stopped imipramine  DT tremor and hangover  DC lithium  to get rid of tremor.  Disc pros and cons of resuming Adderall.  She wants to resume  Discussed potential benefits, risks, and side effects of stimulants with patient to include increased heart rate, hypertension, palpitations, insomnia, increased anxiety, increased irritability, or decreased appetite.  Instructed patient to contact office if experiencing any significant tolerability issues.  Not currently using , reduce Ativan  1/4-1/2 mg tid prn since she is tired from it   Benefit Trileptal   (oxcarbazepine ) 300 mg tablet AM and 600 mg PM,   so continue.  But ? Cog SE and balance .  B12 level checked 496 and normal.  But now on oxcarb and rec SL B12.    Consider alternatives for anger outbursts, VPA, atypical.  Supportive therapy around marital crisis and other stressors.  She needs some goals.  Needs to get out of the house chronically.  Started therapy with Berwyn [facility].  FU 4-8 weeks  Lorene Macintosh, MD, DFAPA   Please see After Visit Summary for patient specific instructions.  Future Appointments  Date Time Provider Department Center  11/30/2023  3:30 PM Helmus, [place], PT [facility] None  12/08/2023 11:45 AM Helmus, Jitka, PT [facility] None  12/22/2023 11:45 AM Helmus, Jitka, PT [facility] None  12/29/2023 11:45 AM Donah Darryle RAMAN, PT [facility] None  01/05/2024 11:45 AM Donah Darryle RAMAN, PT [facility] None  02/02/2024  9:00 AM Cottle, Lorene KANDICE Raddle., MD [facility] None  06/08/2024  9:30 AM [facility]-ANNUAL WELLNESS VISIT [facility] None     No orders of the defined types were placed in this encounter.     -------------------------------

## 2023-11-30 NOTE — Telephone Encounter (Signed)
 Left patient a message re no show today, patient to call us  back at [phone]

## 2023-11-30 NOTE — Patient Instructions (Signed)
 Start sublingual B12 and tablets of folate.  Stop lithium  and see if tremor resolves.

## 2023-12-01 ENCOUNTER — Telehealth: Payer: Self-pay

## 2023-12-01 NOTE — Telephone Encounter (Signed)
 Pt was seen yesterday. She called to say the Trileptal  was helping her mood, reporting that she gets up before her alarm in the morning and stays up - likes that. She is reporting memory issues.  She said she has lost her credit card 3 times in the last 5 weeks and all her automatic drafts are tied to it. Reports picking up the bottle of Trileptal  to ask a question, she called here and then couldn't find the bottle. Can't remember names, bounces around. Sleeps 8 hours/night and happy with that.

## 2023-12-01 NOTE — Telephone Encounter (Signed)
 Reviewed information/recommendations with the patient. She wrote it down and repeated back to me. Reported she picked up B12 and folate this afternoon.

## 2023-12-01 NOTE — Telephone Encounter (Signed)
 We met yesterday and discussed the following to help reduce SE risk:  Start sublingual B12 and tablets of folate.  Stop lithium  and see if tremor resolves.     The B12 and folate will take a little while to help.   So to help her cognition quicker, reduce oxcarbazepine  to 1/2 of 300 mg tablets in the am and continue 2 tablets at night.

## 2023-12-08 ENCOUNTER — Ambulatory Visit: Admitting: Physical Therapy

## 2023-12-08 ENCOUNTER — Encounter: Payer: Self-pay | Admitting: Physical Therapy

## 2023-12-08 DIAGNOSIS — M6281 Muscle weakness (generalized): Secondary | ICD-10-CM | POA: Diagnosis not present

## 2023-12-08 DIAGNOSIS — M5459 Other low back pain: Secondary | ICD-10-CM | POA: Diagnosis not present

## 2023-12-08 DIAGNOSIS — R278 Other lack of coordination: Secondary | ICD-10-CM | POA: Diagnosis not present

## 2023-12-08 NOTE — Therapy (Addendum)
 OUTPATIENT PHYSICAL THERAPY FEMALE PELVIC TREATMENT   Patient Name: Courtney [name] MRN: [id number] DOB:July 30, 1950, 73 y.o., female Today's Date: 12/08/2023  END OF SESSION:  PT End of Session - 12/08/23 1142     Visit Number 3    Date for PT Re-Evaluation 01/26/24    Authorization Type Medicare, BCBS Federal    Progress Note Due on Visit 10    PT Start Time 1143    PT Stop Time 1224    PT Time Calculation (min) 41 min    Activity Tolerance Patient tolerated treatment well    Behavior During Therapy WFL for tasks assessed/performed;Agitated          Past Medical History:  Diagnosis Date   ADD (attention deficit disorder)    Anxiety    Celiac disease    possible vs IBS   Complication of anesthesia    Coronary artery disease    Depression    no bipolar per Dr. Blinda   GI problem    Obie   Gluten intolerance    Hyperlipidemia    IBS (irritable bowel syndrome)    Osteoarthritis    Osteoporosis    Pneumonia    PONV (postoperative nausea and vomiting)    Rheumatoid arthritis (HCC)    Seasonal allergies 01/08/2012   hx. of multiple bronchitis related to this.   Shoulder pain, right    Past Surgical History:  Procedure Laterality Date   EXPLORATORY LAPAROTOMY  01/08/2012   due to endometriosis-portions of both ovaries removed   ROTATOR CUFF REPAIR Right    TONSILLECTOMY     TOTAL HIP ARTHROPLASTY Right    right. 2002   TOTAL HIP ARTHROPLASTY  01/14/2012   Procedure: TOTAL HIP ARTHROPLASTY;  Surgeon: Dempsey LULLA Moan, MD;  Location: [facility];  Service: Orthopedics;  Laterality: Left;   TOTAL KNEE ARTHROPLASTY Left 07/16/2020   Procedure: TOTAL KNEE ARTHROPLASTY;  Surgeon: Moan Dempsey, MD;  Location: [facility];  Service: Orthopedics;  Laterality: Left;    VENTRAL HERNIA REPAIR  01/08/2012   abdominal   Patient Active Problem List   Diagnosis Date Noted   Cervical myelopathy (HCC) 06/11/2022   Foot-drop 06/11/2022   Neck pain 06/11/2022   Primary  localized osteoarthrosis of multiple sites 06/11/2022   Asthmatic bronchitis 08/20/2021   Allergic rhinitis 08/20/2021   Acute sinusitis 06/27/2021   Multiple drug allergies 06/27/2021   Hematoma of spleen, closed, subsequent encounter 04/30/2021   Stress at home 04/30/2021   Hyponatremia 03/06/2021   Menopausal disorder 03/06/2021   Nausea & vomiting 02/27/2021   Generalized abdominal pain 02/27/2021   Primary osteoarthritis of left knee 07/16/2020   Vasculitis (HCC) 07/04/2020   RA (rheumatoid arthritis) (HCC) 07/04/2020   Preop exam for internal medicine 06/03/2020   Menopause 05/31/2020   Allergic contact dermatitis 05/28/2020   Osteopenia 03/29/2020   Degenerative disc disease, cervical 06/15/2019   Acquired leg length discrepancy 06/15/2019   Leg pain 06/14/2019   Toe ulcer (HCC) 06/14/2019   Mixed hyperlipidemia 02/04/2019   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   Trigger finger of left thumb 03/09/2017   Spinal stenosis, lumbar region, 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   Sciatica of right side associated with disorder of lumbar spine 02/26/2017   Carpal tunnel syndrome, right 01/12/2017   CAD in native artery 12/23/2016   Tick bite 09/09/2016   Back pain with right-sided radiculopathy 06/26/2016   Complete tear of right rotator cuff 06/26/2016   Glenohumeral arthritis, right 06/26/2016   Acquired  scoliosis 12/13/2015   Abdominal pain, epigastric 05/09/2015   Dry skin dermatitis 08/04/2012   Constipation 08/04/2012   History of hypothyroidism 08/04/2012   Otitis media, serous 12/25/2010   OSTEOARTHRITIS, HIPS, BILATERAL 06/20/2010   Anxiety disorder 05/03/2009   TOBACCO USE, QUIT 05/03/2009   ONYCHOMYCOSIS 06/28/2008   Actinic keratosis 06/28/2008   Bilateral carotid bruits 06/28/2008   Attention deficit disorder 05/17/2007   Irritable bowel syndrome 05/17/2007   Dyslipidemia 02/28/2007   Depression 02/28/2007   OA (osteoarthritis) of knee  02/28/2007    PCP: Plotnikov, [name], MD  REFERRING PROVIDER: Mat Browning, MD  REFERRING DIAG: R35.0 (ICD-10-CM) - Frequency of micturition  THERAPY DIAG:  Other lack of coordination  Other low back pain  Muscle weakness (generalized)  Rationale for Evaluation and Treatment: Rehabilitation  ONSET DATE: 2024  SUBJECTIVE:                                                                                                                                                                                           SUBJECTIVE STATEMENT: Patient reports that she is not doing that great. Patient reports that she is still doing Miralax , when she does not do it, she has pellets.  Lost her exercises Her exercises are hard to understands She lost her printout Her shoulder is very easy to inflame, she is very cautious Patient wears a back brace Patient only leaks at night when she drinks wine at night    Patient reports that she had difficulty with her breathing exercises She has been constipated and takes Miralax  every 4 days to get something moving since her colonoscopy accident Patient reports that she had some leaking last night aftre 2 glasses of wine and Miralax  and a cup of water  before bed after 8 pm.  She reports that she has been constipated since she was 73 years old worse aftre the accident 3 years ago.      From eval: Pt reports that she can't hold her urine in the middle of the night. Can't hold the urine from the bed to the toilet.  During the day she is ok, no leaking.  Not aware of any recent changes Pt reports that she has drop foot and sciatica, chronic back pain and has ortho PT on [facility] ) Pt reports that she has not done any exercises, Kegels in years.  Fluid intake: water - 2x24 oz/ day  PAIN: in her spine- she is losing cartilage in L4, L5 and S1 Are you having pain? Yes NPRS scale: 8/10  PRECAUTIONS: None  RED  FLAGS: None   WEIGHT BEARING RESTRICTIONS:  No  FALLS:  Has patient fallen in last 6 months? No  OCCUPATION: retired  ACTIVITY LEVEL : active, does not sit, hurts to sit, moves all the time, 3-4 times/ week in the gym, walks in the pool backwards because she was told it strengthens her spine  PLOF: Independent  PATIENT GOALS: to stay dry  PERTINENT HISTORY:   Sexual abuse: No  BOWEL MOVEMENT: yes- had a colonoscopy 2.5 years ago hemorrhaged , was hospitalized 2x since then, has not had normal bowel movements since then Pain with bowel movement: No Type of bowel movement:Type (Bristol Stool Scale) 1-7 depends on  if she takes Miralax  Fully empty rectum: Yes:   Leakage: No Pads: No Fiber supplement/laxative Yes Miralax   URINATION: Pain with urination: No Fully empty bladder: Yes: takes a while Stream: Strong Urgency: No Frequency: depends on how much she drinks Leakage: Walking to the bathroom at night, standing up from sitting, when she drinks too much  Pads: No  INTERCOURSE:  Ability to have vaginal penetration Yes  Pain with intercourse: Initial Penetration and During Penetration- cannot imagine pain free intercourse- she reports that she shrunk and  DrynessYes  Climax: no Marinoff Scale: 3/3 Laxative: Miralax   PREGNANCY: Vaginal deliveries 2 Tearing Yes:   Episiotomy Yes  C-section deliveries 0 Currently pregnant No  PROLAPSE: None   OBJECTIVE:  Note: Objective measures were completed at Evaluation unless otherwise noted.   PATIENT SURVEYS:    PFIQ-7: to be filled later d/ time  COGNITION: Overall cognitive status: Within functional limits for tasks assessed     SENSATION: Light touch: Appears intact  LUMBAR SPECIAL TESTS:  Straight leg raise test: Positive for hypermobility  GAIT: Assistive device utilized: None Comments: slightly antalgic  POSTURE: rounded shoulders, forward head, and right pelvic obliquity   LUMBARAROM/PROM: hinges  and keep lumbar spine flat  LOWER EXTREMITY ROM: hypermobile throughout hips  LOWER EXTREMITY MMT: 4/5  PALPATION:    General: some tightness in vertical abdominal scar, lower abdominal weakness, hypermobility throughout her upper and lower extremities and trunk  Pelvic Alignment: uneven due to  scoliosis  Abdominal: lower abdominal weakness, with abdominal contraction putting pressure on bladder and has difficulty with activation                External Perineal Exam: mild dryness present                             Internal Pelvic Floor: bulges pelvic floor with contraction, mild dryness  Patient confirms identification and approves PT to assess internal pelvic floor and treatment Yes Patient confirms identification and approves PT to assess internal pelvic floor and treatment Yes No emotional/communication barriers or cognitive limitation. Patient is motivated to learn. Patient understands and agrees with treatment goals and plan. PT explains patient will be examined in standing, sitting, and lying down to see how their muscles and joints work. When they are ready, they will be asked to remove their underwear so PT can examine their perineum. The patient is also given the option of providing their own chaperone as one is not provided in our facility. The patient also has the right and is explained the right to defer or refuse any part of the evaluation or treatment including the internal exam. With the patient's consent, PT will use one gloved finger to gently assess the muscles of the pelvic floor, seeing how well it contracts and relaxes and if there is  muscle symmetry. After, the patient will get dressed and PT and patient will discuss exam findings and plan of care. PT and patient discuss plan of care, schedule, attendance policy and HEP activities.     PELVIC MMT:   MMT eval  Vaginal 2/5. 5 reps  Internal Anal Sphincter   External Anal Sphincter   Puborectalis   Diastasis Recti 1  finger  (Blank rows = not tested)        TONE: low  PROLAPSE: Anterior vaginal wall laxity  TODAY'S TREATMENT:                                                                                                                              DATE: 12/08/2023 Seated on yoga ball inhale/ exhale with multimodal clues for pelvic floor awareness 20 reps STS with pelvic floor lift with transverse abdominis breath off higher surface 20 reps Review of progress and healthy bladder habits ( patient reported that she sometimes does not drink water  at home and then drink alcohol (bladder irritant)      11/23/23 Neuro reed- hip adduction with ball with transverse abdominis breath 20 reps Difficulty with holding thera band due to r shoulder and left wrist pain- trial of horizontal abduction with thera band 20 reps Ball press by elbow- improved tolerance 20 reps   11/03/23   EVAL Examination completed, findings reviewed, pt educated on POC, HEP, and female pelvic floor anatomy, reasoning with pelvic floor assessment internally with pt consent. Pt motivated to participate in PT and agreeable to attempt recommendations.     PATIENT EDUCATION/ there acts Education details: Pt was educated on relevant anatomy, exam findings, HEP, expectations of PT   Person educated: Patient Education method: Explanation, Demonstration, Tactile cues, and Verbal cues Education comprehension: verbalized understanding, returned demonstration, verbal cues required, tactile cues required, and needs further education Transverse abdominis breath with ball press- difficult HOME EXERCISE PROGRAM: Access Code: [email or web address] URL: [email or web address] Date: 11/23/2023 Prepared by: Cori Halley Shepheard  Program Notes exhale on exertion- blow out to lift pelvic floor and engage abdomen on the hard part of the movement  Exercises - Supine Pelvic Floor Contraction  - 1 x daily - 7 x weekly - 2 sets - 10 reps - Pelvic Floor  Contractions in Hook[place] with Adduction  - 1 x daily - 7 x weekly - 2 sets - 10 reps - Shoulder extension with resistance - Neutral  - 1 x daily - 7 x weekly - 2 sets - 10 reps - Seated Abdominal Press into [employer]  - 1 x daily - 7 x weekly - 2 sets - 10 reps - Sit to Stand with Pelvic Floor Contraction  - 1 x daily - 7 x weekly - 2 sets - 10 reps - Rowing with Pelvic Floor Contraction  - 1 x daily - 7 x weekly - 2 sets - 10 reps - Diaphragmatic Breathing to Reduce Intra-abdominal Pressure: Lifting a Basket  -  1 x daily - 7 x weekly - 2 sets - 10 reps - Diaphragmatic Breathing to Reduce Intra-abdominal Pressure: Sit to Stand  - 1 x daily - 7 x weekly - 2 sets - 10 reps - Seated Exhale with Pelvic Floor Contraction and Hand to Mouth  - 1 x daily - 7 x weekly - 2 sets - 10 reps  ASSESSMENT:  CLINICAL IMPRESSION: Patient was seen today for treatment of urinary leaking. Patient with constipation as well. Patient did fairly well  with exercises and education today. We discussed progress, HEP and recommended abdominal massage and consistency of exercises. Treatment session focused on pelvic floor strengthening and coordination with breath to improve core coordination. Patient had some difficulty with focusing and attention as well as shoulder and pain. Patient is progressing slowly towards goals and will benefit from continued PT to address deficits, reduce leaking and constipation and improve quality of life.      From evaluation Patient is a 73 y.o. F who was seen today for physical therapy evaluation and treatment for urinary incontinence. Pt with low back chronic pain, constipation and dyspareunia, seems very frustrated. Reported that she has chronic sciatica,  was told her whole life to brace her core, she puts pressure on her bladder with bracing of her upper abdominals. With internal exam findings notable for pelvic floor weakness, anterior vaginal wall laxity and decreased coordination and  awareness. Pt only leaks at night after she has a lot of water , she wakes up between 1-3 and does not make it to the bathroom. Discussed reduced water  intake at least 3 hrs before bedtime. Reported dyspareunia d/t dryness, vaginal moisturizer samples given. Pt has many orthopedic issues- left wrist weakness after an injury 50 years ago or more, right shoulder limited AROM and chronic low back pain. Pt in constant need of moving her body to have reduced pain.  Pt will benefit from PT to address deficits   OBJECTIVE IMPAIRMENTS: decreased activity tolerance, decreased coordination, decreased knowledge of condition, difficulty walking, decreased strength, impaired tone, and pain.   ACTIVITY LIMITATIONS: bending, sleeping, continence, and toileting  PARTICIPATION LIMITATIONS: interpersonal relationship and community activity  PERSONAL FACTORS: Age, Behavior pattern, and Time since onset of injury/illness/exacerbation are also affecting patient's functional outcome.   REHAB POTENTIAL: Good  CLINICAL DECISION MAKING: Evolving/moderate complexity  EVALUATION COMPLEXITY: Moderate   GOALS: Goals reviewed with patient? Yes  SHORT TERM GOALS: Target date: 12/01/2023     Pt will be I and consistent with her HEP and demonstrate all exercises correctly  Baseline: Goal status: INITIAL  2.  Pt will be educated on healthy bladder strategies Baseline:  Goal status: INITIAL  3.  Pt will be educated on urge suppression strategies and the knack Baseline:  Goal status: INITIAL  4.  Pt will be educate on constipation strategies and abdominal massage Baseline:  Goal status: INITIAL   LONG TERM GOALS: Target date: 01/26/2024    Patient able to engage her lower abdominal equally with the upper to reduce pressure on her pelvic floor.   Baseline:  Goal status: INITIAL  2.  Pt will not leak when walking to the bathroom at night.  Baseline:  Goal status: INITIAL  3.  Pt will be I with urge  suppression strategies and the knack Baseline:  Goal status: INITIAL  4.  Pt will demonstrate at least 3/5 pelvic floor strength to reduce leaking Baseline:  Goal status: INITIAL  5.  Pt will report improving number of bowel  movements to at least 5/ week Baseline:  Goal status: INITIAL  6.  Pt will report complete emptying of rectum Baseline:  Goal status: INITIAL  PLAN:  PT FREQUENCY: 1-2x/week  PT DURATION: 12 weeks  PLANNED INTERVENTIONS: 97110-Therapeutic exercises, 97530- Therapeutic activity, 97112- Neuromuscular re-education, 97535- Self Care, [id number]- Manual therapy, [phone]- Electrical stimulation (manual), [phone] (1-2 muscles), [phone] (3+ muscles)- Dry Needling, Patient/Family education, Taping, Joint mobilization, Joint manipulation, Spinal manipulation, Spinal mobilization, Scar mobilization, Cryotherapy, Moist heat, and Biofeedback  PLAN FOR NEXT SESSION: constipation strategies, pressure management exercises, urge suppression, healthy bladder education    Tayler Lassen, PT 12/08/2023, 12:23 PM

## 2023-12-10 DIAGNOSIS — F411 Generalized anxiety disorder: Secondary | ICD-10-CM | POA: Diagnosis not present

## 2023-12-15 ENCOUNTER — Encounter: Admitting: Physical Therapy

## 2023-12-16 ENCOUNTER — Ambulatory Visit: Admitting: Physical Therapy

## 2023-12-17 DIAGNOSIS — F411 Generalized anxiety disorder: Secondary | ICD-10-CM | POA: Diagnosis not present

## 2023-12-22 ENCOUNTER — Encounter: Admitting: Physical Therapy

## 2023-12-29 ENCOUNTER — Encounter: Admitting: Physical Therapy

## 2023-12-30 ENCOUNTER — Other Ambulatory Visit: Payer: Self-pay | Admitting: Psychiatry

## 2023-12-30 DIAGNOSIS — F331 Major depressive disorder, recurrent, moderate: Secondary | ICD-10-CM

## 2023-12-30 DIAGNOSIS — F411 Generalized anxiety disorder: Secondary | ICD-10-CM

## 2023-12-31 DIAGNOSIS — F411 Generalized anxiety disorder: Secondary | ICD-10-CM | POA: Diagnosis not present

## 2024-01-05 ENCOUNTER — Ambulatory Visit (INDEPENDENT_AMBULATORY_CARE_PROVIDER_SITE_OTHER): Admitting: Internal Medicine

## 2024-01-05 ENCOUNTER — Ambulatory Visit: Admitting: Physical Therapy

## 2024-01-05 ENCOUNTER — Encounter: Payer: Self-pay | Admitting: Internal Medicine

## 2024-01-05 VITALS — BP 100/70 | HR 78 | Temp 98.6°F | Ht 64.0 in | Wt 117.0 lb

## 2024-01-05 DIAGNOSIS — E785 Hyperlipidemia, unspecified: Secondary | ICD-10-CM

## 2024-01-05 DIAGNOSIS — K582 Mixed irritable bowel syndrome: Secondary | ICD-10-CM | POA: Diagnosis not present

## 2024-01-05 DIAGNOSIS — R1084 Generalized abdominal pain: Secondary | ICD-10-CM | POA: Diagnosis not present

## 2024-01-05 DIAGNOSIS — I2510 Atherosclerotic heart disease of native coronary artery without angina pectoris: Secondary | ICD-10-CM

## 2024-01-05 DIAGNOSIS — S36029D Unspecified contusion of spleen, subsequent encounter: Secondary | ICD-10-CM | POA: Diagnosis not present

## 2024-01-05 NOTE — Assessment & Plan Note (Signed)
 constipation alternating w/diarrhea on Miralax . Now on Mag citrae tablets - doing better.

## 2024-01-05 NOTE — Assessment & Plan Note (Addendum)
 Same sx's as in 12/22 when she had splenic subcapsular hematoma compatible with grade 3 splenic injury s/p embolization with IR (12/22) - ?post-colonoscopy Check abd CT or US  GI ref - Dr Abran

## 2024-01-05 NOTE — Assessment & Plan Note (Signed)
 Statin intolerant Check lipids later 2017 Coronary calcium  CT score of 209.

## 2024-01-05 NOTE — Progress Notes (Signed)
 Subjective:  Patient ID: Courtney [name], female    DOB: 10/16/1950  Age: 73 y.o. MRN: [id number]  CC: GI Problem (Started 3 years ago after colonoscopy. Asking for cholesterol lab to be done. )   HPI Courtney [name] presents for constipation alternating w/diarrhea on Miralax . Now on Mag citrae tablets - doing better. Last Fri - Courtney [name] was harvesting radishes and developed nausea, pressure in the colon,   Outpatient Medications Prior to Visit  Medication Sig Dispense Refill   albuterol  (VENTOLIN  HFA) 108 (90 Base) MCG/ACT inhaler Inhale 2 puffs into the lungs every 4 (four) hours as needed for wheezing or shortness of breath. 18 g 3   amphetamine -dextroamphetamine  (ADDERALL) 15 MG tablet Take 0.5 tablets by mouth 2 (two) times daily. 90 tablet 0   cyclobenzaprine (FLEXERIL) 10 MG tablet Take 10 mg by mouth as needed.     fluticasone  (FLONASE ) 50 MCG/ACT nasal spray Place into both nostrils daily.     folic acid  (FOLVITE ) 1 MG tablet Take 1 tablet (1 mg total) by mouth daily. 90 tablet 3   hydroxychloroquine (PLAQUENIL) 200 MG tablet Take 1 tablet by mouth 2 (two) times daily.     levothyroxine (SYNTHROID) 50 MCG tablet Take 50 mcg by mouth daily before breakfast.     LORazepam  (ATIVAN ) 0.5 MG tablet Take 1 tablet (0.5 mg total) by mouth every 8 (eight) hours. 90 tablet 0   nitroGLYCERIN  (NITROSTAT ) 0.4 MG SL tablet Place 1 tablet (0.4 mg total) under the tongue every 5 (five) minutes as needed for chest pain. 25 tablet 2   ondansetron  (ZOFRAN -ODT) 4 MG disintegrating tablet Take 1 tablet (4 mg total) by mouth every 8 (eight) hours as needed. 20 tablet 0   Oxcarbazepine  (TRILEPTAL ) 300 MG tablet TAKE 1 TABLET BY MOUTH EVERY MORNING AND 2 TABLETS AT NIGHT 270 tablet 0   Testosterone  1.62 % GEL Place 5 mg onto the skin daily. 30 g 3   traMADol  (ULTRAM ) 50 MG tablet TAKE 1/2 TO 1 TABLET(25 TO 50 MG) BY MOUTH BACK EVERY 8 HOURS AS NEEDED FOR SEVERE PAIN 90 tablet 0   omega-3 acid  ethyl esters (LOVAZA ) 1 g capsule Take 2 capsules (2 g total) by mouth 2 (two) times daily. (Patient not taking: Reported on 01/05/2024) 120 capsule 11   vitamin E 400 UNIT capsule Take 800 Units by mouth daily.  (Patient not taking: Reported on 01/05/2024)     No facility-administered medications prior to visit.    ROS: Review of Systems  Constitutional:  Negative for activity change, appetite change, chills, fatigue and unexpected weight change.  HENT:  Negative for congestion, mouth sores and sinus pressure.   Eyes:  Negative for visual disturbance.  Respiratory:  Negative for cough and chest tightness.   Gastrointestinal:  Positive for abdominal pain and nausea. Negative for vomiting.  Genitourinary:  Negative for difficulty urinating, frequency and vaginal pain.  Musculoskeletal:  Positive for arthralgias and back pain. Negative for gait problem.  Skin:  Negative for pallor and rash.  Neurological:  Negative for dizziness, tremors, weakness, numbness and headaches.  Psychiatric/Behavioral:  Negative for confusion and sleep disturbance.     Objective:  BP 100/70   Pulse 78   Temp 98.6 F (37 C) (Oral)   Ht 5' 4 (1.626 m)   Wt 117 lb (53.1 kg)   LMP 01/08/1999   SpO2 97%   BMI 20.08 kg/m   BP Readings from Last 3 Encounters:  01/05/24 100/70  05/25/23 102/78  04/07/23 130/78    Wt Readings from Last 3 Encounters:  01/05/24 117 lb (53.1 kg)  06/08/23 118 lb (53.5 kg)  05/25/23 118 lb (53.5 kg)    Physical Exam Constitutional:      General: She is not in acute distress.    Appearance: Normal appearance. She is well-developed. She is not ill-appearing.  HENT:     Head: Normocephalic.     Right Ear: External ear normal.     Left Ear: External ear normal.     Nose: Nose normal.  Eyes:     General:        Right eye: No discharge.        Left eye: No discharge.     Conjunctiva/sclera: Conjunctivae normal.     Pupils: Pupils are equal, round, and reactive to light.   Neck:     Thyroid : No thyromegaly.     Vascular: No JVD.     Trachea: No tracheal deviation.  Cardiovascular:     Rate and Rhythm: Normal rate and regular rhythm.     Heart sounds: Normal heart sounds.  Pulmonary:     Effort: No respiratory distress.     Breath sounds: No stridor. No wheezing.  Abdominal:     General: Bowel sounds are normal. There is no distension.     Palpations: Abdomen is soft. There is no mass.     Tenderness: There is no abdominal tenderness. There is no guarding or rebound.  Musculoskeletal:        General: Tenderness present.     Cervical back: Normal range of motion and neck supple. No rigidity.     Right lower leg: No edema.     Left lower leg: No edema.  Lymphadenopathy:     Cervical: No cervical adenopathy.  Skin:    Findings: No erythema or rash.  Neurological:     Mental Status: She is oriented to person, place, and time.     Cranial Nerves: No cranial nerve deficit.     Motor: No abnormal muscle tone.     Coordination: Coordination normal.     Gait: Gait normal.     Deep Tendon Reflexes: Reflexes normal.  Psychiatric:        Behavior: Behavior normal.        Thought Content: Thought content normal.        Judgment: Judgment normal.   Abd s/nt, no mass  Lab Results  Component Value Date   WBC 6.2 08/27/2022   HGB 13.8 08/27/2022   HCT 41.6 08/27/2022   PLT 350.0 08/27/2022   GLUCOSE 92 06/29/2023   CHOL 174 08/27/2022   TRIG 57.0 08/27/2022   HDL 80.20 08/27/2022   LDLDIRECT 120.7 08/04/2012   LDLCALC 82 08/27/2022   ALT 17 08/27/2022   AST 23 08/27/2022   NA 135 06/29/2023   K 4.7 06/29/2023   CL 104 06/29/2023   CREATININE 0.88 06/29/2023   BUN 21 06/29/2023   CO2 23 06/29/2023   TSH 1.33 08/27/2022   INR 1.0 07/06/2020   HGBA1C 5.4 02/11/2018    No results found.  Assessment & Plan:   Problem List Items Addressed This Visit     CAD in native artery   2017 Coronary calcium  CT score of 209.       Dyslipidemia    Statin intolerant Check lipids later 2017 Coronary calcium  CT score of 209.       Relevant Orders   Lipid panel   Generalized  abdominal pain - Primary   Same sx's as in 12/22 when she had splenic subcapsular hematoma compatible with grade 3 splenic injury s/p embolization with IR (12/22) - ?post-colonoscopy Check abd CT or US  GI ref - Dr Abran      Relevant Orders   Ambulatory referral to Gastroenterology   CBC with Differential/Platelet   Comprehensive metabolic panel with GFR   Urinalysis   Sedimentation rate   US  Abdomen Complete   Hematoma of spleen, closed, subsequent encounter   Same sx's as in 12/22 when she had splenic subcapsular hematoma compatible with grade 3 splenic injury s/p embolization with IR (12/22) - ?post-colonoscopy Check abd CT or US       Relevant Orders   Ambulatory referral to Gastroenterology   CBC with Differential/Platelet   Comprehensive metabolic panel with GFR   Urinalysis   Sedimentation rate   US  Abdomen Complete   Irritable bowel syndrome   constipation alternating w/diarrhea on Miralax . Now on Mag citrae tablets - doing better.      Relevant Orders   Ambulatory referral to Gastroenterology      No orders of the defined types were placed in this encounter.     Follow-up: Return in about 4 weeks (around 02/02/2024) for a follow-up visit.  Marolyn Noel, MD

## 2024-01-05 NOTE — Assessment & Plan Note (Signed)
 2017 Coronary calcium  CT score of 209.

## 2024-01-05 NOTE — Assessment & Plan Note (Signed)
 Same sx's as in 12/22 when she had splenic subcapsular hematoma compatible with grade 3 splenic injury s/p embolization with IR (12/22) - ?post-colonoscopy Check abd CT or US 

## 2024-01-06 ENCOUNTER — Telehealth: Payer: Self-pay

## 2024-01-06 MED ORDER — ARIPIPRAZOLE 5 MG PO TABS: 5.0000 mg | ORAL_TABLET | Freq: Every day | ORAL | 0 refills | Status: DC

## 2024-01-06 NOTE — Telephone Encounter (Signed)
 Pt reporting that Trileptal  has helped take the level of her anger down where she isn't explosive, but now reporting a chronic nonstop anger - everyone is an idiot and needs to stay out of my way. I asked if she had any new stressors and she said she didn't know how to answer that. She isn't c/o depression, just the anger. Has FU 10/7.   From 8/4:  reduce Ativan  1/4-1/2 mg tid prn since she is tired from it    Benefit Trileptal  (oxcarbazepine ) 300 mg tablet AM and 600 mg PM,   so continue.  But ? Cog SE and balance .  B12 level checked 496 and normal.  But now on oxcarb and rec SL B12.     Consider alternatives for anger outbursts, VPA, atypical.

## 2024-01-06 NOTE — Telephone Encounter (Signed)
 Recommendations reviewed with the patient. She said she would try Abilify . Rx sent to [facility] in [place].

## 2024-01-06 NOTE — Telephone Encounter (Signed)
 The next best alternative is Abilify .  This is not like anything else she has taken and doesn't have cognitive SE and rarely causes tremor at low dose.  If she is willing, we will add this to the current meds and adjust the dose as needed.  Then if it works we will start weaning the Trileptal  at her next visit with me.  Send in Abilify  5 mg daily.

## 2024-01-11 ENCOUNTER — Inpatient Hospital Stay: Admission: RE | Admit: 2024-01-11 | Source: Ambulatory Visit

## 2024-01-12 ENCOUNTER — Ambulatory Visit: Admitting: Internal Medicine

## 2024-01-21 ENCOUNTER — Other Ambulatory Visit

## 2024-01-22 ENCOUNTER — Ambulatory Visit: Admitting: Physical Therapy

## 2024-01-25 DIAGNOSIS — E039 Hypothyroidism, unspecified: Secondary | ICD-10-CM | POA: Diagnosis not present

## 2024-01-25 DIAGNOSIS — E559 Vitamin D deficiency, unspecified: Secondary | ICD-10-CM | POA: Diagnosis not present

## 2024-01-25 LAB — LAB REPORT - SCANNED: EGFR: 72

## 2024-01-28 ENCOUNTER — Ambulatory Visit: Admitting: Physical Therapy

## 2024-01-28 DIAGNOSIS — F411 Generalized anxiety disorder: Secondary | ICD-10-CM | POA: Diagnosis not present

## 2024-02-02 ENCOUNTER — Ambulatory Visit: Admitting: Psychiatry

## 2024-02-02 ENCOUNTER — Encounter: Admitting: Physical Therapy

## 2024-02-02 ENCOUNTER — Encounter: Payer: Self-pay | Admitting: Psychiatry

## 2024-02-02 DIAGNOSIS — F331 Major depressive disorder, recurrent, moderate: Secondary | ICD-10-CM

## 2024-02-02 DIAGNOSIS — F902 Attention-deficit hyperactivity disorder, combined type: Secondary | ICD-10-CM | POA: Diagnosis not present

## 2024-02-02 DIAGNOSIS — F411 Generalized anxiety disorder: Secondary | ICD-10-CM

## 2024-02-02 MED ORDER — AMPHETAMINE-DEXTROAMPHETAMINE 15 MG PO TABS: 7.5000 mg | ORAL_TABLET | Freq: Two times a day (BID) | ORAL | 0 refills | Status: AC

## 2024-02-02 NOTE — Progress Notes (Signed)
 Courtney [name] [id number] [birth year] 73 y.o.   Subjective:   Patient ID:  Courtney [name] is a 73 y.o. (DOB [birth year]) female.  Chief Complaint:  No chief complaint on file.    Courtney [name] presents to the office today for follow-up of first visit 03/01/2020 referred by a friend Courtney [name].  Was prescribed Viibryd  and Vyvanse was changed to Concerta  54 mg to try to reduce the amount of dry mouth.  05/02/2020 appointment with the following noted: Less dry mouth with Concerta  but didn't seem to last beyond 3-4 PM. Viibryd  20 mg daily.  Hard to get enough calories at breakfast.  But has been taking both in the morning.  Some benefit with Viibryd . Is there something I can take besides a stimulant, bc fears it is stressing her body.  Dx college exhausted adrenal glands.  It helps.  Asked questions about alternatives to stimulants.  Reduced Concerta  to reduce anxiety and see if can achieve, but she thinks it's worse in the evening. Anxiety is still a struggle.  Life crisis moment.  Chronic marital dissatisfaction worse now that both are retired.  Feels like she's in a prison.  Family notices she's stressed.  Has done therapy since age 41 yo.  Has done Hindu meditation without help.  Christian faith disciplines.  Grew up caretaking.  I'm done and I need to change.  Goal is calm down enough to function through the situation. Tingling foot and wants B12 testing. Plan: Switch Viibryd  to 20 mg in evening meal to get better absorption.  06/18/2020 appointment with following noted: No difference in anxiety or mood with Viibryd  20 mg daily.  She doesn't want to increase it. Questions about Viibryd  and Genesight testing.  Asks about differences between Adderall and MPH.   Missed for 3 days Concerta  and took Adderall.    Tired by 4-5 PM and doesn't like that.    Plan: She wants to wean off the Viibryd  because she does not feel it has been helpful and she does not want to increase the  dosage.  07/03/2020 phone call patient stating she wanted to continue Viibryd  20 mg daily.  She also requested refill of Concerta  54 mg  08/14/2020 appointment with the following noted: Was told she couldn't take Viibryd  with one of the pain meds and had TKR.  So stopped it.  Couldn't tolerate pain meds except tramadol .  Had more pain than expected.   Stopped Viibryd  about 07/15/20.  She had taken tramadol  with Viibryd  in the past without a problem.   Noticing more pain after surgery in non-surgical places. Seems to be sensitive to getting lightheaded and confused with low blood sugar.   Poor attention and scattered since surgery.  Also more tired. Occ taken tramadol  and otherwise just Tylenol . Impossible to tell the effect of the Viibryd  given she hasn't been herself for months. Concerned about Courtney [name]'s memory who is also a patient here.  Wants him to get neuropsych testing. Plan because patient is med sensitive we will start very low-dose fluvoxamine  25 mg nightly Per her request continue Concerta  54 mg every morning  10/25/2020 appointment with the following noted: Several phone calls since being here.  The first led to increasing fluvoxamine  to 1-1/2 of the 25 mg tablets due to lack of effect at 3 weeks. 09/21/2020 she called asking to stop Concerta  which she did. She called again wanting to start Adderall.  Because she had taken it in 2019 it was agreed that she could start  Adderall 10 mg twice daily. Stress dealing with husband and whether to move or not.  [name] separate.  Anxiety is very high.  Hard to calm down around him. $ stress.  Explodes with anger at husband.  Doesn't think he can sell the house on his own. Never been this stressed and anxious and in this kind of circumstance before. Never got the Adderall DT need for PA. Only caffeine in the AM Plan: Rec increase Luvox  from 37.5 mg daily to 50 mg HS for a week then increase to 75 mg daily and possibly higher.  B12 level checked 496  and normal. Hold Adderall until the anxiety is under control.   12/03/2020 phone call from patient: Patient called stating fluvoxamine  made her more anxious and high strung and she stopped it.  She wanted an earlier appointment which was not available at the time she was given the option to see a nurse practitioner but refused.  12/19/2020 appointment with the following noted: Life very stressful right now and not getting better.  B in law died and marriage falling apart. Separating.   More than I can handle including anxiety and depression. Plan: Rec trial beta blocker propranolol  10-30 mg  twice daily for anxiety Consider TCA bc Genesight test  B12 level checked 496 and normal. Hold Adderall until the anxiety is under control.   01/01/2021 phone call from patient with nurse as follows: She is taking up to 30 mg propranolol  twice daily without sufficient benefit for her anxiety.  01/03/2021 appointment with the following noted: Rare Adderall.  Propranolol  didn't help anxiety much at 30 mg BID without SE.[name]   Thinks she's gotten depressed which is unusual.  In a perfect storm with divorce and moving and financial stress.  Not functioning well.  Hard to make a decision.  Poor productivity.   Having a lot of pain, chronically and worse lately. Starting Lyrica  today. Taking alprazolam  about 0.5 mg daily. Plan: Yes imipramine  25 and increase to 75 mg HS and then check blood level  02/26/21 appt noted: 01/21/2021 serum imipramine  and desipramine  total was 65 at 75 mg daily. It has made a difference.  But has made so many changes and moving.  Stress goes to GI system.   Was in ER last night with GI px and [name] have ulcer.  Unable to eat without nausea.  Lump in the throat nausea. Mild taking the edge off.  Can still explode. Can't remember the SE at 100 mg daily. GI dominating with pain and nausea. Sleep varies from good or bothered by sickness. Usually fine but sometimes can't turn her brain off.   Moves in 5 days. Plan: Continue imipramine  and increase to 100 mg again as soon as nausea managed. Add risperidone  1 mg HS off label for nausea and anxiety.  03/28/2021 appt noted: She is not taking the risperidone .  Tried it for a couple of weeks but did not help the nausea.  Didn't notice it helping anxiety but had tremor.  Off for 2 weeks and tremor better. Still on imipramine  75 mg nightly.  She did increase but wasn't sure it was causing the tremor as instructed. This week to GI and work up ordered and doubled med dose. Not handling the transition well.  Very irritable with husband.  Such a sense of urgency.   Not often with Xanax .   Not on Adderall.   Plan: imipramine  and increase to 100 mg nightly.  B12 level checked 496 and normal. Hold  Adderall until the anxiety is under control.  Switch Xanax  to Ativan  1 mg tid since she is not on Adderall.    05/02/2021 appt noted: Increased imipramine  100 mg HS. Tolerated the increase Spleen bleed. Yesterday decent energy compared to what she had.  Ativan  made her sleepy but only took it once. Sleeping a lot DT spleen injury. Can't tell about depression bc of injury kept her in bed.  Has to move to feel normal. Plan: imipramine  and increase to 150 mg nightly, based on prior level.  05/29/21 appt noted: SE dry mouth Increased imipramine  150 mg HS Seen improvement in mood.  Meeting irritating situations with less response.  Better self control.  Less fear and anxiety. Ativan  1 mg prn makes her too sleepy and tired. Occ taken 1/2 Adderall with some energy and motivation.   GI work up ongoing with gastric emptying yesterday.  It's delayed ongoing.  GI doctor has commented on her hostility.   Sleeping really well.   Worst dep and anxiety was when moving and now 50% better.  Need to have some goals and since moving into townhouse and unknown what is next.  [name] move out of town to be near the kids at part of the year but it's too cold.  Big decisions  are hard. Difficulty with back pain so hasn't unpacked.   More hopeful about her health. Seasonal depression Plan: Imipramine  is helpful but increase could be more helpful based on level.  Disc pros and cons on increase.  She wants to increase it..  Consider lithium  augmentation. Increase imipramine  to 200 mg nightly, based on prior level Disc light therapy in detail and gave handout.    Adderall prn. Reduce Ativan  1/2 mg tid prn since she is tired from it and not on Adderall usually.    07/05/2021 phone call complaining of dry mouth with imipramine .  Frustrating because of reportedly low sodium and was told to drink less water . MD response:We increased the imipramine  from 3 of the 50 mg tablets to 4 of the 50 mg tablets on February 1.  If she has seen additional improvement in her mood it is to her advantage to continue the current dosage.  If she has not seen additional improvement since increasing the dose she can drop the imipramine  back to 3 tablets daily. In addition to that she should discuss the dry mouth problem with her pharmacist as the pharmacist [name] have some other recommendations to help manage it.  But the usual recommendation is to switch her toothpaste to Biotene toothpaste and use the Biotene mouthwash and Biotene spray to manage the dry mouth as best she can. Patient response:Notified patient. She said the issue is more complex than just dry mouth. She has been having GI issues and has been very sick, had a splenic bleed, is unable to get out of bed at times. Dr. Gwynneth at [facility] is her GI doctor. Patient spoke to her after Wilbert talked to her today and is wanting her to stop the imipramine  to eliminate that as a source of her issues.   MD response:Okay she can come off the imipramine  better mood anxiety and irritability will get worse off of it because it helped her.  However we cannot start any new medicines until we see if her physical symptoms are any different off the  medicine.  She can reduce imipramine  by 1 tablet every 3 to 4 days.  The dry mouth will not resolve until she is off  the medicine.  The splenic bleed is not related to imipramine  unless she has some bizarre sort of allergy .  08/01/21 appt noted: Santina totally off imipramine  for a week and GI px not better and restarted imipramine  to 100 mg HS DT chronic constipation and then this week increased to 150 just this week.   Therapy with Berwyn helping a lot. Overall depression and anxiety is better. Trying to find endo.  Medically cleared from splenic bleed but doesn't think it is fine. Episode of abd paine with result in bed 5 days but back out of bed and functioning again. Hasn't needed lorazepam  lately. Recognizes still angry and intense too much .   Plan check level and add lithium  300  10/07/21 appt noted; Taboo about lithium  and didn't take it. Irritability is still focused and directed at H and not sure how it would be wihtout him.  Trying to address this with therapy and faith and prayer.   Seeing Berwyn Ellen.   Everything fuels my fire.  Erupting this is never gonna change rage.  Recognizes this part is her problem.  Gets upset with him not doing things she wants done. Body getting stronger since Xmas and more she can do the better her depression.   Still some stiffness with pain in spine and leg in AM and then gets better. Not seriously depressed if can move. Don't handle inactivity well.  Able to do more now though has helped. Can't control her reaction to Chyrl though knows it is over the top since she retired. Intermittent nausea with activity causes her to go to bed.  Waves of pain with it.  Frustrated cause is unknown. Plan: Continue imipramine  150 mg HS for a week and level was WNL at 238. Augment with lithium  CR 300 mg HS for anger and mood  01/22/22 appt noted: Currently take imipramine  100 mg HS and lithium  CR 300 mg HS, Adderall 10 BID Reduced imipramine  to help tremor and  it is some better.  Occ tremor, positional.  Mood is a lot better with lithium .  Right away clearing of haze and calming down.  Better outlook and been like that since then. Questions about effects on organs.   Sleep well.   Not sig anxious now.  Occ moments of anxiety.   Asks if could stop imipramine  bc benefit with lithium .  04/10/22 appt noted: Currently stopped imipramine  100 mg HS (2 days ago) and taking lithium  CR 300 mg HS, Adderall 10 BID Good but have to stop the imipramine  bc of the shaking.  She cut dose to 50 mg QOD. Tremor worse with R hand.  R handed.  Feels like lithium  helped She wants to see what mood is like without imipramine .  Less tremor with less imipramine . Tremor is better with less right now. [name] need shoulder replacement DT pain.  Also needs spinal fusion DT dropped foot. Hard to talk about it bc so dreads it.   Irritability is there and when ignited is bad but working on it in therapy.    Been angry my whole life.  Back to trauma in childhood.  Overall though better with lithium .   Miralax  helped GI problems but waves of ok and then GI px.   Needs Adderall daily. Takes Ativan  just occ with stressful circumstances. Plan: on imipramine  100 mg HS for a week and level was WNL at 238. She stopped it this week and disc risk of relapse of dep but she hopes lithium  will keep  dep at bay Continue lithium  CR 300 mg HS bc helped anger and mood  06/17/22 appt noted: recently taking 1/2 lithium  300 and imipramine  50 mg HS for 4-5 days and tremor better with less lithium . CO tremor;   irritates her and makes her feel bad about herself.  Never really tried more 10 mg propranolol  for tremor. More pain in shoulder.   Not sure if any change in explosive irritation at H.  Not sure if it is her H or bc of the relationship. Depression is not that bad in the last month and no worse with less imipramine .   Would like to put a spacer between triggers and what she says to H .  Not good.    When in [place] 2 yr ago for 2 mos alone but still had underlying anger problems. Plan: no med changes  07/14/22 appt noted: Increased lithium  back to 1 and 1/2 tablet for a month.   Till has it in R hand esp.  Uses propranolol  sometimes. Only tried it 4 times. An on impramine 50 mg HS imipramine  100 mg HS for a week and level was WNL at 238. Mood not bad at all  Irritability ?  Better with more lithium .  09/10/22 appt noted: Chronic procrastination unless workig job. Mood is not bad except triggered by H.  No change with increase lithium .  B6 400 BID has reduced and managed tremor. Almost entirely stopped Adderall bc GI sx if not taken with food.  Gut never normal since spleen issue.  Feels something wrong with GI tract.  Cycles of constipation and N and then abd cramping for days at time and then it will be ok for awhile.   Psych meds imipramine  50 mg HS, lithium  300/450 QOD, Adderall 10 BID prn, lorazepam  1 mg prn (none lately, I don't like to be slowed down). Taking miralax  every other day.   Anxiety is not unusuallly high  no panic but some chronic anxiety. Sleep well at night and needs nap if not 8 hours. More postive when on Adderall re: mood and better focused and task completion.    01/01/23 appt noted: Psych meds: Adderall XR 15, cyclobenzaprine 10 mg as needed in the AM for tremor, imipramine  50 mg nightly, lithium  ER 450 mg nightly. (Never increased), lorazepam  rarely She reduced imipramine  to 50 bc she is convinced it is causing the shaking.  Not very much tremor but some postural.   Mood is ok.  I'm happy.   Has Covid.  Day 4-5 of sx, pretty mild.    03/05/23 appt noted: Psych med: as above, imipramine  50 mg HS, lithium  ER 450 mg HS, Adderall XR 15 AM Wonders about ongoing nonstop battle with constipation since colonoscopy.  Also SE dry mouth 3-4 hours.  Level of dep is much better than it was.   Working in therapy with Berwyn Ellen. H [name] remains target of eruptive  anger If there was a way to calm me down Chronic constipation from childhood is worse.   Plan: increase lithium  600 mg every day trial for ongoing anger.   If it helps then can try reducing imipramine  Check lithium  level and BMP  05/07/23 appt noted: Psych med: as above, imipramine  50 mg HS, lithium  ER 450 mg HS, Adderall XR 15 AM She increased lithium  to 600 mg daily and didn't notice a change so went back to 450 mg daily and no change noticed. No SE nor benefit.  Unless tremor related.  When  gripping the tremor shows up on R not left .  No resting tremor.   Went on trips and very anxiety provoking and didn't notice than in years past.   Not more anxious after taking Adderall.  Still jumps from one thing to the next.  Poor efficiency at home.   Plan trial buspirone   07/08/23 appt noted: Psych med: as above, imipramine  50 mg HS, lithium  reduced to 300 mg HS, Adderall XR 15 AM daily,  buspirone  15 BID SE : Bothered by tremor.  Is more persistent.  Tremor predated lithium . Mood is good.  Anxiety better when active.  Need to get out of the house to manage it.   Sleep likes to sleep 9-10 hours and can do it consistently. Caffeine 1 daily. Stopped imipramine  and shaking got better.  Have continued the lihtium.   09/22/23  appt noted: Psych med: imipramine  50 mg HS, lithium  ER 450 mg HS, NO Adderall XR 15 AM daily,  restarting buspirone  15 BID Stopped all meds since here.  Was having GI px and having trouble with taking pills.  N would start in the morning.  Had some issues with pills for years without eating even tylenol .   Stopped Adderall and buspirone .  Thinks Adderall was causing some GI Trip triggered worsening CBP and mobility. Hosp with difficulty walking and MRI spinal degeneration.   Gi IS BETTER  WITOUT aDDERALL.   Only px is H.  She gets angry wiith H and overreacts.  Recognizes she is irritable.  Working on it in therapy.  Will cut him off even before he finishes sentence.   More  scattered without Adderall. Plan: Stop buspirone  Start Trileptal  (oxcarbazepine ) 150 mg tablet at night for 1 week,  then 1 tablet at night and 1/2 tablet in the morning and 1 tablet at night for 1 week,  Then increase to 1 tablet in the morning and 1 and 1/2 tablets at night   11/30/23 appt noted:  Med: oxcarbazepine  300 mg AM and 600 mg PM, lithium  150 mg daily,  Adderall 15 mg 1/2 BID,  lorazepam  1 mg TID prn SE sleepy Shaking 70% better with reduction lithium .    Positional.  Don't respond any less but less screaming and don't go as high with agitation.   But feels like has sudden onset of Alzheimer's.   Sleep perfect 8 hours.  But does have chronic pain.  Easier to wake up.  Love that.   Dep really good lately.   Plan: DC lithium  to get rid of tremor.  02/02/24 appt noted:  Med: as above except DC lithium , Adderall XR 15 mg AM,  lorazepam  0.5 mg TID prn SE sleepy,  never took aripiprazole . Has list from pharmacy of concerns Wrong Adderall sent.  Wants IR 15 mg.  I can't take these pills RE: pills in generally. Stress sister dying of CA. Worrying over package insert on aripiprazole  re: dementia and death. Irritable worse around [place].  Knows she takes things out on him.  I hate me.  Last sig away from H  in 2022 when went to [place].   Less irritable when there housesitting.   CC chronic iritability.   Past Psychiatric Medication Trials:  Trintellix NR, sertraline?,  Viibryd  20, remote zoloft, Paxil,  Cymbalta  couple days ? Adverse reaction Imipramine  150 better imipramine  100 mg HS for a week and level was WNL at 238. She stopped imipramine  DT tremor and hangover Fluvoxamine  SE anxiety but ? Adequacy of trial bc  stress Fluvoxamine  100 in 2015 per Dr. Blinda  Lithium  300 helpful, 600 not more helpful, tremor  Gabapentin  1600 NR Lyrica   Risperidone  1 tremor  Vyvanse, Adderall, concerta   DC propranolol  10-30 mg  twice daily for anxiety bc not helpful, Lorazepam , Xanax  sleepy.  Diazepam  sleepy.   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 Blinda and Darice Molt  Started therapy at 10 yo bc fear mother would die any minute bc she had cardiac px.  Review of Systems:  Review of Systems  Cardiovascular:  Negative for chest pain.  Gastrointestinal:  Positive for abdominal pain and nausea.  Musculoskeletal:  Positive for arthralgias, back pain and gait problem.  Neurological:  Positive for dizziness and tremors. Negative for weakness.  Psychiatric/Behavioral:  Positive for dysphoric mood. Negative for agitation, confusion, decreased concentration and hallucinations. The patient is nervous/anxious.     Medications: I have reviewed the patient's current medications.  Current Outpatient Medications  Medication Sig Dispense Refill   albuterol  (VENTOLIN  HFA) 108 (90 Base) MCG/ACT inhaler Inhale 2 puffs into the lungs every 4 (four) hours as needed for wheezing or shortness of breath. 18 g 3   amphetamine -dextroamphetamine  (ADDERALL) 15 MG tablet Take 0.5 tablets by mouth 2 (two) times daily. 90 tablet 0   ARIPiprazole  (ABILIFY ) 5 MG tablet Take 1 tablet (5 mg total) by mouth daily. 30 tablet 0   cyclobenzaprine (FLEXERIL) 10 MG tablet Take 10 mg by mouth as needed.     fluticasone  (FLONASE ) 50 MCG/ACT nasal spray Place into both nostrils daily.     folic acid  (FOLVITE ) 1 MG tablet Take 1 tablet (1 mg total) by mouth daily. 90 tablet 3   hydroxychloroquine (PLAQUENIL) 200 MG tablet Take 1 tablet by mouth 2 (two) times daily.     levothyroxine (SYNTHROID) 50 MCG tablet Take 50 mcg by mouth daily before breakfast.     LORazepam  (ATIVAN ) 0.5 MG tablet Take 1 tablet (0.5 mg total) by mouth every 8 (eight) hours. 90 tablet 0   nitroGLYCERIN  (NITROSTAT ) 0.4 MG SL tablet Place 1 tablet (0.4 mg total) under the tongue every 5 (five) minutes as needed for chest pain. 25 tablet 2   omega-3 acid ethyl esters (LOVAZA ) 1 g capsule Take 2 capsules (2 g total) by mouth 2 (two) times daily.  (Patient not taking: Reported on 01/05/2024) 120 capsule 11   ondansetron  (ZOFRAN -ODT) 4 MG disintegrating tablet Take 1 tablet (4 mg total) by mouth every 8 (eight) hours as needed. 20 tablet 0   Oxcarbazepine  (TRILEPTAL ) 300 MG tablet TAKE 1 TABLET BY MOUTH EVERY MORNING AND 2 TABLETS AT NIGHT 270 tablet 0   Testosterone  1.62 % GEL Place 5 mg onto the skin daily. 30 g 3   traMADol  (ULTRAM ) 50 MG tablet TAKE 1/2 TO 1 TABLET(25 TO 50 MG) BY MOUTH BACK EVERY 8 HOURS AS NEEDED FOR SEVERE PAIN 90 tablet 0   vitamin E 400 UNIT capsule Take 800 Units by mouth daily.  (Patient not taking: Reported on 01/05/2024)     No current facility-administered medications for this visit.    Medication Side Effects: Other: dry mouth  Allergies:  Allergies  Allergen Reactions   Clarithromycin Nausea Only   Covid-19 (Mrna) Vaccine Other (See Comments)    Acute Vasculitis; excessive bleeding  Other reaction(s): Other (See Comments) Acute Vasculitis; excessive bleeding    Other Other (See Comments)   Bactrim [Sulfamethoxazole-Trimethoprim] Nausea And Vomiting   Morphine Nausea And Vomiting    Other reaction(s): Nausea/Vomiting   Morphine  And Codeine Nausea And Vomiting   Azithromycin Nausea And Vomiting and Nausea Only   Ceclor [Cefaclor] Nausea And Vomiting    Can take Augmentin  ok   Claritin [Loratadine]     REACTION: bruises   Codeine Nausea And Vomiting    Can take Tramadol  ok   Cymbalta  [Duloxetine  Hcl]    Erythromycin Nausea And Vomiting    Other reaction(s): Nausea/Vomiting   Erythromycin Base Nausea And Vomiting   Levofloxacin Nausea Only    REACTION: nausea   Lipitor [Atorvastatin ]     Other reaction(s): Myalgias (intolerance)   Nitrofurantoin  Nausea Only   Oxycodone  Other (See Comments)   Penicillins Nausea And Vomiting    Can take Augmentin    Propoxyphene Nausea Only    dizzy   Propoxyphene N-Acetaminophen  Nausea Only    dizzy    Past Medical History:  Diagnosis Date   ADD  (attention deficit disorder)    Anxiety    Celiac disease    possible vs IBS   Complication of anesthesia    Coronary artery disease    Depression    no bipolar per Dr. Blinda   GI problem    Obie   Gluten intolerance    Hyperlipidemia    IBS (irritable bowel syndrome)    Osteoarthritis    Osteoporosis    Pneumonia    PONV (postoperative nausea and vomiting)    Rheumatoid arthritis (HCC)    Seasonal allergies 01/08/2012   hx. of multiple bronchitis related to this.   Shoulder pain, right     Family History  Problem Relation Age of Onset   Heart disease Mother    Heart attack Mother    Prostate cancer Father    Heart disease Sister    Heart disease Maternal Grandmother    Breast cancer Paternal Grandmother    Colon cancer Paternal Grandfather    Coronary artery disease Other        FH Female 1st degree relative <60   ADD / ADHD Other    Other Daughter        gluten intolerance    Social History   Socioeconomic History   Marital status: Married    Spouse name: Not on file   Number of children: 2   Years of education: Not on file   Highest education level: Not on file  Occupational History   Occupation: Network engineer  Tobacco Use   Smoking status: Never   Smokeless tobacco: Never   Tobacco comments:    only Financial trader   Vaping status: Never Used  Substance and Sexual Activity   Alcohol use: Yes    Comment: 0-1 per day   Drug use: No   Sexual activity: Yes  Other Topics Concern   Not on file  Social History Narrative   Married for last 42 years.Lives with husband.Retired 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   Social Drivers of Corporate investment banker Strain: Low Risk  (06/08/2023)   Overall Financial Resource Strain (CARDIA)    Difficulty of Paying Living Expenses: Not hard at all  Food Insecurity: Low Risk  (07/13/2023)   Received from [facility]   Hunger Vital Sign    Within the past 12 months, you worried  that your food would run out before you got money to buy more: Never true    Within the past 12 months, the food you bought just didn't last and you didn't have money to get more. : Never true  Transportation Needs: No Transportation Needs (07/13/2023)   Received from [facility]    In the past 12 months, has lack of reliable transportation kept you from medical appointments, meetings, work or from getting things needed for daily living? : No  Physical Activity: Sufficiently Active (06/08/2023)   Exercise Vital Sign    Days of Exercise per Week: 4 days    Minutes of Exercise per Session: 120 min  Stress: No Stress Concern Present (06/08/2023)   [employer] of Occupational Health - Occupational Stress Questionnaire    Feeling of Stress : Not at all  Social Connections: Socially Integrated (06/08/2023)   Social Connection and Isolation Panel    Frequency of Communication with Friends and Family: More than three times a week    Frequency of Social Gatherings with Friends and Family: More than three times a week    Attends Religious Services: More than 4 times per year    Active Member of [employer] or Organizations: Yes    Attends Banker Meetings: More than 4 times per year    Marital Status: Married  Catering manager Violence: Not At Risk (06/08/2023)   Humiliation, Afraid, Rape, and Kick questionnaire    Fear of Current or Ex-Partner: No    Emotionally Abused: No    Physically Abused: No    Sexually Abused: No    Past Medical History, Surgical history, Social history, and Family history were reviewed and updated as appropriate.   Please see review of systems for further details on the patient's review from today.   Objective:   Physical Exam:  LMP 01/08/1999   Physical Exam Constitutional:      General: She is not in acute distress. Musculoskeletal:        General: No deformity.  Neurological:     Mental Status: She is alert and oriented to  person, place, and time.     Cranial Nerves: No dysarthria.     Coordination: Coordination normal.  Psychiatric:        Attention and Perception: Attention and perception normal. She does not perceive auditory or visual hallucinations.        Mood and Affect: Mood is anxious. Mood is not depressed. Affect is not labile, blunt, angry or inappropriate.        Speech: Speech normal. Speech is not rapid and pressured.        Behavior: Behavior normal. Behavior is cooperative.        Thought Content: Thought content normal. Thought content is not paranoid or delusional. Thought content does not include homicidal or suicidal ideation. Thought content does not include suicidal plan.        Cognition and Memory: Cognition and memory normal.        Judgment: Judgment normal.     Comments: Insight intact Anger at home better.  Not in office Frustrated again.       Lab Review:     Component Value Date/Time   NA 135 06/29/2023 1443   NA 134 06/14/2018 1444   K 4.7 06/29/2023 1443   CL 104 06/29/2023 1443   CO2 23 06/29/2023 1443   GLUCOSE 92 06/29/2023 1443   BUN 21 06/29/2023 1443   BUN 10 06/14/2018 1444   CREATININE 0.88 06/29/2023 1443   CALCIUM  9.5 06/29/2023 1443   PROT 6.6 08/27/2022 0940   PROT 6.5 12/19/2020 0754   ALBUMIN 4.0 08/27/2022 0940   ALBUMIN 4.5 12/19/2020 0754   AST 23 08/27/2022  0940   ALT 17 08/27/2022 0940   ALKPHOS 53 08/27/2022 0940   BILITOT 0.3 08/27/2022 0940   BILITOT 0.4 12/19/2020 0754   GFRNONAA 60 (L) 04/17/2021 1825   GFRNONAA 79 04/10/2020 1431   GFRAA 91 04/10/2020 1431       Component Value Date/Time   WBC 6.2 08/27/2022 0940   RBC 4.36 08/27/2022 0940   HGB 13.8 08/27/2022 0940   HCT 41.6 08/27/2022 0940   PLT 350.0 08/27/2022 0940   MCV 95.5 08/27/2022 0940   MCH 31.4 04/17/2021 1825   MCHC 33.0 08/27/2022 0940   RDW 13.7 08/27/2022 0940   LYMPHSABS 1.4 08/27/2022 0940   MONOABS 0.5 08/27/2022 0940   EOSABS 1.0 (H) 08/27/2022 0940    BASOSABS 0.1 08/27/2022 0940    Lithium  Lvl  Date Value Ref Range Status  06/29/2023 0.6 0.6 - 1.2 mmol/L Final     No results found for: PHENYTOIN, PHENOBARB, VALPROATE, CBMZ   06/04/20 B12 normal at 496  08/16/21   Latest Reference Range & Units 08/16/21 08:29  Desipramine  mcg/L 128  Imipramine  Lvl mcg/L 110   01/21/2021 imipramine  level 25, desipramine  level 40 equals total 65 which is low on 75 mg daily. 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 They have one copy of the C allele and one copy of the G allele. This genotype suggests a normal response to certain ADHD medications.  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 This genotype suggests a lower risk of serious hypersensitivity reactions, including Stevens-Johnson syndrome (SJS), toxic epidermal necrolysis (TEN), maculopapular eruptions, and Drug Reaction with Eosinophilia and Systemic Symptoms when taking certain mood stabilizers.  HLA-B*1502Lower Risk Not Present This patient does not carry the HLA-B*1502 allele or a closely related *15 allele. Absence of HLA-B*1502 and the closely related *15 alleles suggests lower risk of serious dermatologic reactions including toxic epidermal necrolysis (TEN) and Stevens-Johnson syndrome (SJS) when taking certain mood stabilizers.  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 They carry two copies of the G allele. This genotype has been associated with an increased risk of adverse drug reactions with certain selective serotonin reuptake inhibitors.  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 The short promoter allele is reported to decrease expression of the serotonin transporter compared to the homozygous long promoter allele. The patient [name] have a moderately decreased likelihood of response to certain selective serotonin reuptake inhibitors due to the presence of the short form of the gene.  Pharmacokinetic Genes PK CES1A1Extensive (Normal) Metabolizer GLY/GLY CES1A1 - Gly allele enzyme activity: Normal CES1A1 - Gly allele enzyme activity: Normal  This genotype is most consistent with the extensive (normal) metabolizer phenotype.  The patient is expected to have normal enzyme activity.  CYP1A2Extensive (Normal) Metabolizer *1/*1 This genotype is most consistent with the extensive (normal) metabolizer phenotype.  CYP2B6Poor Metabolizer *6/*6 CYP2B6*6 allele enzyme activity: Reduced CYP2B6*6 allele enzyme activity: Reduced  This genotype is most consistent with the poor metabolizer phenotype. This patient [name] have reduced enzyme activity as compared to individuals with the normal phenotype.  CYP2C19Extensive (Normal) Metabolizer *1/*1 CYP2C19*1 allele enzyme activity: Normal CYP2C19*1 allele enzyme activity: Normal  This genotype is most consistent with the extensive (normal) metabolizer phenotype.  CYP2C9Intermediate Metabolizer *1/*2 CYP2C9*1 allele enzyme activity: Normal CYP2C9*2 allele enzyme activity: Reduced  This genotype is most consistent with the intermediate metabolizer phenotype. This patient [name] have reduced enzyme activity as compared to individuals with the normal phenotype.  CYP2D6Extensive (Normal) Metabolizer *1/*41 CYP2D6*1 allele enzyme activity: Normal CYP2D6*41 allele enzyme activity: Reduced  This genotype is most consistent with the extensive (normal) metabolizer phenotype.  CYP3A4Poor Metabolizer *22/*22 CYP3A4*22 allele enzyme  activity: Reduced CYP3A4*22 allele enzyme activity: Reduced  This genotype is most consistent with the poor metabolizer phenotype. This patient [name] have reduced enzyme activity as compared to individuals with the normal phenotype.  UGT1A4Ultrarapid Metabolizer *1/*3 UGT1A4*1 allele enzyme activity: Normal UGT1A4*3 allele enzyme activity: Increased  This genotype is most consistent with the ultrarapid metabolizer phenotype. This patient [name] have increased enzyme activity as compared to individuals with the normal phenotype.  UGT2B15Intermediate Metabolizer *2/*2 UGT2B15*2 allele enzyme activity: Reduced UGT2B15*2 allele enzyme activity: Reduced  This genotype is most consistent with the intermediate metabolizer phenotype. This patient [name] have reduced enzyme activity as compared to individuals with the normal phenotype. .res Assessment: Plan:    There are no diagnoses linked to this encounter.  30 min face to face time with patient was spent on counseling and coordination of care. We discussed    Overall depression is better and anxiety was better too.     Overall less irritable than depressed but just with H.  Oxcarb helped irritability but ? Forgetful and balance issues but has had some of this for years.  She wanted to change.  Some chronic anxiety but not severe now.  Rec she not change meds on her own.  This has been an issue  She stopped imipramine  DT tremor and hangover  Disc pros and cons of resuming Adderall.  She wants to resume  Discussed potential benefits, risks, and side effects of stimulants with patient to include increased heart rate, hypertension, palpitations, insomnia, increased anxiety, increased irritability, or decreased appetite.  Instructed patient to contact office if experiencing any significant tolerability issues.  Not currently using , reduce Ativan  1/4-1/2 mg tid prn since she is tired from it   Benefit Trileptal  (oxcarbazepine ) 300 mg tablet AM and  600 mg PM but not enough and concerns over SE Wean it every 3 days and start Abilify  5 mg daily.  Disc SE including TD in detail.  Discussed potential metabolic side effects associated with atypical antipsychotics, as well as potential risk for movement side effects. Advised pt to contact office if movement side effects occur.  She agrees. But ? Cog SE and balance .  B12 level checked 496 and normal.  But now on oxcarb and rec SL B12.    Consider alternatives for anger outbursts, VPA, atypical.  Counseling 25 Min:  around fear of meds.  And recognizing pattern of irritablity generally acted out at [employer]  Her insight is better and worked on it.  Addressed fear of meds by discussing SE concerns.  Started therapy with Berwyn Ellen.  FU 4-8 weeks  Lorene Macintosh, MD, DFAPA   Please see After Visit Summary for patient specific instructions.  Future Appointments  Date Time Provider Department Center  02/03/2024  9:00 AM [facility]  4 [id number] [id number] [facility]  03/10/2024  2:30 PM Cottle, Lorene KANDICE Raddle., MD [facility] None  06/08/2024  9:30 AM [facility]-ANNUAL WELLNESS VISIT [facility] [place]     No orders of  the defined types were placed in this encounter.     -------------------------------

## 2024-02-02 NOTE — Patient Instructions (Signed)
 Reduce oxcarbazepine  by 1 tablet every 4 days. Start aripiprazole  now at one daily;

## 2024-02-03 ENCOUNTER — Other Ambulatory Visit

## 2024-02-08 ENCOUNTER — Ambulatory Visit: Payer: Self-pay | Admitting: Internal Medicine

## 2024-02-08 ENCOUNTER — Ambulatory Visit
Admission: RE | Admit: 2024-02-08 | Discharge: 2024-02-08 | Disposition: A | Source: Ambulatory Visit | Attending: Internal Medicine | Admitting: Internal Medicine

## 2024-02-08 DIAGNOSIS — R1084 Generalized abdominal pain: Secondary | ICD-10-CM

## 2024-02-08 DIAGNOSIS — S36029D Unspecified contusion of spleen, subsequent encounter: Secondary | ICD-10-CM

## 2024-02-08 DIAGNOSIS — N200 Calculus of kidney: Secondary | ICD-10-CM | POA: Diagnosis not present

## 2024-02-09 DIAGNOSIS — Z79890 Hormone replacement therapy (postmenopausal): Secondary | ICD-10-CM | POA: Diagnosis not present

## 2024-02-09 DIAGNOSIS — R300 Dysuria: Secondary | ICD-10-CM | POA: Diagnosis not present

## 2024-02-11 DIAGNOSIS — F411 Generalized anxiety disorder: Secondary | ICD-10-CM | POA: Diagnosis not present

## 2024-02-25 ENCOUNTER — Other Ambulatory Visit

## 2024-02-25 DIAGNOSIS — R1084 Generalized abdominal pain: Secondary | ICD-10-CM | POA: Diagnosis not present

## 2024-02-25 DIAGNOSIS — F411 Generalized anxiety disorder: Secondary | ICD-10-CM | POA: Diagnosis not present

## 2024-02-25 DIAGNOSIS — E871 Hypo-osmolality and hyponatremia: Secondary | ICD-10-CM | POA: Diagnosis not present

## 2024-02-25 DIAGNOSIS — S36029D Unspecified contusion of spleen, subsequent encounter: Secondary | ICD-10-CM | POA: Diagnosis not present

## 2024-02-25 DIAGNOSIS — E785 Hyperlipidemia, unspecified: Secondary | ICD-10-CM | POA: Diagnosis not present

## 2024-02-25 LAB — CBC WITH DIFFERENTIAL/PLATELET
Basophils Absolute: 0 K/uL (ref 0.0–0.1)
Basophils Relative: 0.9 % (ref 0.0–3.0)
Eosinophils Absolute: 0.1 K/uL (ref 0.0–0.7)
Eosinophils Relative: 2.7 % (ref 0.0–5.0)
HCT: 42.4 % (ref 36.0–46.0)
Hemoglobin: 14.2 g/dL (ref 12.0–15.0)
Lymphocytes Relative: 24.8 % (ref 12.0–46.0)
Lymphs Abs: 1.2 K/uL (ref 0.7–4.0)
MCHC: 33.6 g/dL (ref 30.0–36.0)
MCV: 93.2 fl (ref 78.0–100.0)
Monocytes Absolute: 0.5 K/uL (ref 0.1–1.0)
Monocytes Relative: 11.2 % (ref 3.0–12.0)
Neutro Abs: 2.9 K/uL (ref 1.4–7.7)
Neutrophils Relative %: 60.4 % (ref 43.0–77.0)
Platelets: 317 K/uL (ref 150.0–400.0)
RBC: 4.55 Mil/uL (ref 3.87–5.11)
RDW: 13.6 % (ref 11.5–15.5)
WBC: 4.9 K/uL (ref 4.0–10.5)

## 2024-02-25 LAB — COMPREHENSIVE METABOLIC PANEL WITH GFR
ALT: 17 U/L (ref 0–35)
AST: 22 U/L (ref 0–37)
Albumin: 4.3 g/dL (ref 3.5–5.2)
Alkaline Phosphatase: 62 U/L (ref 39–117)
BUN: 16 mg/dL (ref 6–23)
CO2: 29 meq/L (ref 19–32)
Calcium: 9.5 mg/dL (ref 8.4–10.5)
Chloride: 102 meq/L (ref 96–112)
Creatinine, Ser: 0.78 mg/dL (ref 0.40–1.20)
GFR: 74.37 mL/min (ref >60.00)
Glucose, Bld: 93 mg/dL (ref 70–99)
Potassium: 4.3 meq/L (ref 3.5–5.1)
Sodium: 137 meq/L (ref 135–145)
Total Bilirubin: 0.5 mg/dL (ref 0.2–1.2)
Total Protein: 6.8 g/dL (ref 6.0–8.3)

## 2024-02-25 LAB — URINALYSIS
Bilirubin Urine: NEGATIVE
Hgb urine dipstick: NEGATIVE
Ketones, ur: NEGATIVE
Leukocytes,Ua: NEGATIVE
Nitrite: NEGATIVE
Specific Gravity, Urine: 1.01 (ref 1.000–1.030)
Total Protein, Urine: NEGATIVE
Urine Glucose: NEGATIVE
Urobilinogen, UA: 0.2 (ref 0.0–1.0)
pH: 7 (ref 5.0–8.0)

## 2024-02-25 LAB — SEDIMENTATION RATE: Sed Rate: 23 mm/h (ref 0–30)

## 2024-02-25 LAB — LIPID PANEL
Cholesterol: 262 mg/dL — ABNORMAL HIGH (ref 0–200)
HDL: 103.8 mg/dL (ref >39.00)
LDL Cholesterol: 140 mg/dL — ABNORMAL HIGH (ref 0–99)
NonHDL: 158.39
Total CHOL/HDL Ratio: 3
Triglycerides: 90 mg/dL (ref 0.0–149.0)
VLDL: 18 mg/dL (ref 0.0–40.0)

## 2024-03-02 ENCOUNTER — Telehealth: Payer: Self-pay | Admitting: Psychiatry

## 2024-03-02 MED ORDER — ARIPIPRAZOLE 5 MG PO TABS: 5.0000 mg | ORAL_TABLET | Freq: Every day | ORAL | 0 refills | Status: DC

## 2024-03-02 NOTE — Telephone Encounter (Signed)
 Sent!

## 2024-03-02 NOTE — Telephone Encounter (Signed)
 Courtney [name] called and said that she needs a refill on her abilfy 5 mg. She started taking in October. Pharmacy is [facility] in [place]. She is leaving in the morning to go [place] 

## 2024-03-03 DIAGNOSIS — F411 Generalized anxiety disorder: Secondary | ICD-10-CM | POA: Diagnosis not present

## 2024-03-10 ENCOUNTER — Ambulatory Visit: Admitting: Psychiatry

## 2024-03-17 ENCOUNTER — Ambulatory Visit: Admitting: Psychiatry

## 2024-03-17 DIAGNOSIS — Z79899 Other long term (current) drug therapy: Secondary | ICD-10-CM | POA: Diagnosis not present

## 2024-03-17 DIAGNOSIS — F411 Generalized anxiety disorder: Secondary | ICD-10-CM | POA: Diagnosis not present

## 2024-03-17 DIAGNOSIS — M0579 Rheumatoid arthritis with rheumatoid factor of multiple sites without organ or systems involvement: Secondary | ICD-10-CM | POA: Diagnosis not present

## 2024-03-17 DIAGNOSIS — M1991 Primary osteoarthritis, unspecified site: Secondary | ICD-10-CM | POA: Diagnosis not present

## 2024-03-17 DIAGNOSIS — Z6823 Body mass index (BMI) 23.0-23.9, adult: Secondary | ICD-10-CM | POA: Diagnosis not present

## 2024-03-22 DIAGNOSIS — F411 Generalized anxiety disorder: Secondary | ICD-10-CM | POA: Diagnosis not present

## 2024-03-31 ENCOUNTER — Other Ambulatory Visit: Payer: Self-pay | Admitting: Psychiatry

## 2024-03-31 ENCOUNTER — Telehealth: Payer: Self-pay | Admitting: Psychiatry

## 2024-03-31 DIAGNOSIS — F411 Generalized anxiety disorder: Secondary | ICD-10-CM | POA: Diagnosis not present

## 2024-03-31 NOTE — Telephone Encounter (Signed)
 PT is requesting a RF of Abilify  5mg  but needs a 90 day supply. Send to: [id number] - [place], [place] - [address]

## 2024-03-31 NOTE — Telephone Encounter (Signed)
 Dr. Geoffry sent in a 90-day supply.

## 2024-04-08 DIAGNOSIS — R32 Unspecified urinary incontinence: Secondary | ICD-10-CM | POA: Diagnosis not present

## 2024-04-08 DIAGNOSIS — N952 Postmenopausal atrophic vaginitis: Secondary | ICD-10-CM | POA: Diagnosis not present

## 2024-04-13 ENCOUNTER — Ambulatory Visit: Admitting: Psychiatry

## 2024-04-13 DIAGNOSIS — H25813 Combined forms of age-related cataract, bilateral: Secondary | ICD-10-CM | POA: Diagnosis not present

## 2024-04-13 DIAGNOSIS — H04123 Dry eye syndrome of bilateral lacrimal glands: Secondary | ICD-10-CM | POA: Diagnosis not present

## 2024-05-18 ENCOUNTER — Ambulatory Visit: Admitting: Psychiatry

## 2024-05-23 ENCOUNTER — Ambulatory Visit: Admitting: Psychiatry

## 2024-05-26 ENCOUNTER — Ambulatory Visit: Admitting: Psychiatry

## 2024-06-08 ENCOUNTER — Ambulatory Visit: Payer: Medicare Other

## 2024-06-08 ENCOUNTER — Ambulatory Visit: Admitting: Physical Therapy

## 2024-06-09 ENCOUNTER — Ambulatory Visit

## 2024-06-21 ENCOUNTER — Ambulatory Visit: Admitting: Psychiatry

## 2024-11-29 ENCOUNTER — Ambulatory Visit
